# Patient Record
Sex: Male | Born: 1935 | Race: Black or African American | Hispanic: No | Marital: Married | Smoking: Never smoker
Health system: Southern US, Community
[De-identification: ages and names within clinical notes are randomized; demographics above are authoritative.]

## PROBLEM LIST (undated history)

## (undated) DIAGNOSIS — F0390 Unspecified dementia without behavioral disturbance: Secondary | ICD-10-CM

## (undated) DIAGNOSIS — I10 Essential (primary) hypertension: Secondary | ICD-10-CM

## (undated) DIAGNOSIS — I6529 Occlusion and stenosis of unspecified carotid artery: Secondary | ICD-10-CM

## (undated) DIAGNOSIS — I739 Peripheral vascular disease, unspecified: Secondary | ICD-10-CM

## (undated) DIAGNOSIS — E785 Hyperlipidemia, unspecified: Secondary | ICD-10-CM

## (undated) DIAGNOSIS — C189 Malignant neoplasm of colon, unspecified: Secondary | ICD-10-CM

## (undated) DIAGNOSIS — N184 Chronic kidney disease, stage 4 (severe): Secondary | ICD-10-CM

## (undated) HISTORY — DX: Occlusion and stenosis of unspecified carotid artery: I65.29

## (undated) HISTORY — DX: Malignant neoplasm of colon, unspecified: C18.9

---

## 2001-06-18 ENCOUNTER — Encounter (INDEPENDENT_AMBULATORY_CARE_PROVIDER_SITE_OTHER): Payer: Self-pay | Admitting: Specialist

## 2001-06-18 ENCOUNTER — Ambulatory Visit (HOSPITAL_COMMUNITY): Admission: RE | Admit: 2001-06-18 | Discharge: 2001-06-18 | Payer: Self-pay | Admitting: *Deleted

## 2003-11-13 HISTORY — PX: OTHER SURGICAL HISTORY: SHX169

## 2004-09-14 ENCOUNTER — Ambulatory Visit (HOSPITAL_COMMUNITY): Admission: RE | Admit: 2004-09-14 | Discharge: 2004-09-14 | Payer: Self-pay | Admitting: *Deleted

## 2004-09-14 ENCOUNTER — Encounter (INDEPENDENT_AMBULATORY_CARE_PROVIDER_SITE_OTHER): Payer: Self-pay | Admitting: *Deleted

## 2004-09-28 ENCOUNTER — Ambulatory Visit (HOSPITAL_COMMUNITY): Admission: RE | Admit: 2004-09-28 | Discharge: 2004-09-28 | Payer: Self-pay | Admitting: *Deleted

## 2004-10-04 ENCOUNTER — Inpatient Hospital Stay (HOSPITAL_COMMUNITY): Admission: RE | Admit: 2004-10-04 | Discharge: 2004-10-11 | Payer: Self-pay | Admitting: General Surgery

## 2004-10-04 ENCOUNTER — Encounter (INDEPENDENT_AMBULATORY_CARE_PROVIDER_SITE_OTHER): Payer: Self-pay | Admitting: Specialist

## 2004-10-12 ENCOUNTER — Ambulatory Visit: Payer: Self-pay | Admitting: Oncology

## 2004-10-12 DIAGNOSIS — C189 Malignant neoplasm of colon, unspecified: Secondary | ICD-10-CM

## 2004-10-12 HISTORY — DX: Malignant neoplasm of colon, unspecified: C18.9

## 2004-11-15 ENCOUNTER — Ambulatory Visit (HOSPITAL_COMMUNITY): Admission: RE | Admit: 2004-11-15 | Discharge: 2004-11-15 | Payer: Self-pay | Admitting: General Surgery

## 2004-11-15 ENCOUNTER — Ambulatory Visit (HOSPITAL_BASED_OUTPATIENT_CLINIC_OR_DEPARTMENT_OTHER): Admission: RE | Admit: 2004-11-15 | Discharge: 2004-11-15 | Payer: Self-pay | Admitting: General Surgery

## 2004-11-28 ENCOUNTER — Ambulatory Visit: Payer: Self-pay | Admitting: Oncology

## 2005-01-15 ENCOUNTER — Ambulatory Visit: Payer: Self-pay | Admitting: Oncology

## 2005-02-07 ENCOUNTER — Ambulatory Visit: Payer: Self-pay | Admitting: Dentistry

## 2005-02-07 ENCOUNTER — Encounter: Admission: RE | Admit: 2005-02-07 | Discharge: 2005-02-07 | Payer: Self-pay | Admitting: Dentistry

## 2005-02-15 ENCOUNTER — Ambulatory Visit (HOSPITAL_COMMUNITY): Admission: RE | Admit: 2005-02-15 | Discharge: 2005-02-15 | Payer: Self-pay | Admitting: Dentistry

## 2005-02-15 ENCOUNTER — Ambulatory Visit: Payer: Self-pay | Admitting: Dentistry

## 2005-03-02 ENCOUNTER — Ambulatory Visit: Payer: Self-pay | Admitting: Oncology

## 2005-03-13 ENCOUNTER — Ambulatory Visit: Payer: Self-pay | Admitting: Oncology

## 2005-03-21 ENCOUNTER — Ambulatory Visit: Payer: Self-pay | Admitting: Dentistry

## 2005-03-24 ENCOUNTER — Ambulatory Visit (HOSPITAL_COMMUNITY): Admission: AD | Admit: 2005-03-24 | Discharge: 2005-03-24 | Payer: Self-pay | Admitting: Oncology

## 2005-03-25 ENCOUNTER — Ambulatory Visit (HOSPITAL_COMMUNITY): Admission: AD | Admit: 2005-03-25 | Discharge: 2005-03-25 | Payer: Self-pay | Admitting: Oncology

## 2005-03-29 ENCOUNTER — Ambulatory Visit (HOSPITAL_COMMUNITY): Admission: RE | Admit: 2005-03-29 | Discharge: 2005-03-29 | Payer: Self-pay | Admitting: Oncology

## 2005-04-30 ENCOUNTER — Ambulatory Visit (HOSPITAL_COMMUNITY): Admission: RE | Admit: 2005-04-30 | Discharge: 2005-04-30 | Payer: Self-pay | Admitting: Oncology

## 2005-05-02 ENCOUNTER — Ambulatory Visit: Payer: Self-pay | Admitting: Oncology

## 2005-05-15 ENCOUNTER — Inpatient Hospital Stay (HOSPITAL_COMMUNITY): Admission: EM | Admit: 2005-05-15 | Discharge: 2005-05-19 | Payer: Self-pay | Admitting: Emergency Medicine

## 2005-05-17 ENCOUNTER — Encounter (INDEPENDENT_AMBULATORY_CARE_PROVIDER_SITE_OTHER): Payer: Self-pay | Admitting: *Deleted

## 2005-05-17 ENCOUNTER — Encounter (INDEPENDENT_AMBULATORY_CARE_PROVIDER_SITE_OTHER): Payer: Self-pay | Admitting: Cardiology

## 2005-06-20 ENCOUNTER — Ambulatory Visit: Payer: Self-pay | Admitting: Oncology

## 2005-07-02 ENCOUNTER — Ambulatory Visit (HOSPITAL_COMMUNITY): Admission: RE | Admit: 2005-07-02 | Discharge: 2005-07-02 | Payer: Self-pay | Admitting: Ophthalmology

## 2005-08-31 ENCOUNTER — Ambulatory Visit: Payer: Self-pay | Admitting: Oncology

## 2005-09-05 ENCOUNTER — Ambulatory Visit (HOSPITAL_COMMUNITY): Admission: RE | Admit: 2005-09-05 | Discharge: 2005-09-05 | Payer: Self-pay | Admitting: Oncology

## 2005-10-11 ENCOUNTER — Ambulatory Visit (HOSPITAL_COMMUNITY): Admission: RE | Admit: 2005-10-11 | Discharge: 2005-10-11 | Payer: Self-pay | Admitting: *Deleted

## 2005-12-10 ENCOUNTER — Ambulatory Visit: Payer: Self-pay | Admitting: Oncology

## 2005-12-14 ENCOUNTER — Ambulatory Visit (HOSPITAL_COMMUNITY): Admission: RE | Admit: 2005-12-14 | Discharge: 2005-12-14 | Payer: Self-pay | Admitting: Oncology

## 2006-03-04 ENCOUNTER — Ambulatory Visit: Payer: Self-pay | Admitting: Oncology

## 2006-03-07 ENCOUNTER — Ambulatory Visit (HOSPITAL_COMMUNITY): Admission: RE | Admit: 2006-03-07 | Discharge: 2006-03-07 | Payer: Self-pay | Admitting: Oncology

## 2006-05-10 ENCOUNTER — Ambulatory Visit: Payer: Self-pay | Admitting: Oncology

## 2006-05-13 LAB — CBC WITH DIFFERENTIAL (CANCER CENTER ONLY)
BASO#: 0 10*3/uL (ref 0.0–0.2)
BASO%: 0.5 % (ref 0.0–2.0)
HCT: 41.2 % (ref 38.7–49.9)
LYMPH%: 17.5 % (ref 14.0–48.0)
MCV: 86 fL (ref 82–98)
MONO#: 0.4 10*3/uL (ref 0.1–0.9)
NEUT%: 75.3 % (ref 40.0–80.0)
RDW: 13 % (ref 10.5–14.6)
WBC: 8 10*3/uL (ref 4.0–10.0)

## 2006-05-13 LAB — COMPREHENSIVE METABOLIC PANEL
ALT: 14 U/L (ref 0–40)
Alkaline Phosphatase: 102 U/L (ref 39–117)
Creatinine, Ser: 1.49 mg/dL (ref 0.40–1.50)
Sodium: 137 mEq/L (ref 135–145)
Total Bilirubin: 0.4 mg/dL (ref 0.3–1.2)
Total Protein: 7.4 g/dL (ref 6.0–8.3)

## 2006-05-13 LAB — CEA: CEA: 3.4 ng/mL (ref 0.0–5.0)

## 2006-05-13 LAB — HEMOGLOBIN A1C: Hgb A1c MFr Bld: 6.8 % — ABNORMAL HIGH (ref 4.6–6.1)

## 2006-05-29 ENCOUNTER — Ambulatory Visit (HOSPITAL_COMMUNITY): Admission: RE | Admit: 2006-05-29 | Discharge: 2006-05-29 | Payer: Self-pay | Admitting: Oncology

## 2006-07-09 ENCOUNTER — Ambulatory Visit (HOSPITAL_COMMUNITY): Admission: RE | Admit: 2006-07-09 | Discharge: 2006-07-09 | Payer: Self-pay | Admitting: *Deleted

## 2006-07-09 ENCOUNTER — Encounter (INDEPENDENT_AMBULATORY_CARE_PROVIDER_SITE_OTHER): Payer: Self-pay | Admitting: *Deleted

## 2006-08-19 ENCOUNTER — Ambulatory Visit: Payer: Self-pay | Admitting: Oncology

## 2006-11-11 ENCOUNTER — Ambulatory Visit: Payer: Self-pay | Admitting: Oncology

## 2006-11-13 LAB — CBC WITH DIFFERENTIAL (CANCER CENTER ONLY)
BASO%: 0.4 % (ref 0.0–2.0)
EOS%: 2.8 % (ref 0.0–7.0)
LYMPH#: 1.4 10*3/uL (ref 0.9–3.3)
MCHC: 32.8 g/dL (ref 32.0–35.9)
NEUT#: 5.5 10*3/uL (ref 1.5–6.5)
NEUT%: 72.9 % (ref 40.0–80.0)
Platelets: 182 10*3/uL (ref 145–400)
RDW: 14.3 % (ref 10.5–14.6)

## 2006-11-13 LAB — LIPID PANEL
HDL: 61 mg/dL (ref >39)
Total CHOL/HDL Ratio: 3.2 Ratio
Triglycerides: 108 mg/dL (ref <150)

## 2006-11-13 LAB — CMP AND LIVER
ALT: 16 U/L (ref 0–53)
AST: 13 U/L (ref 0–37)
Albumin: 4.5 g/dL (ref 3.5–5.2)
BUN: 20 mg/dL (ref 6–23)
Bilirubin, Direct: 0.1 mg/dL (ref 0.0–0.3)
CO2: 25 mEq/L (ref 19–32)
Calcium: 9.1 mg/dL (ref 8.4–10.5)
Chloride: 101 mEq/L (ref 96–112)
Creatinine, Ser: 1.47 mg/dL (ref 0.40–1.50)
Potassium: 4.6 mEq/L (ref 3.5–5.3)

## 2006-11-15 ENCOUNTER — Ambulatory Visit (HOSPITAL_COMMUNITY): Admission: RE | Admit: 2006-11-15 | Discharge: 2006-11-15 | Payer: Self-pay | Admitting: Oncology

## 2007-03-11 ENCOUNTER — Ambulatory Visit: Payer: Self-pay | Admitting: Oncology

## 2007-03-13 LAB — CBC WITH DIFFERENTIAL (CANCER CENTER ONLY)
BASO#: 0 10*3/uL (ref 0.0–0.2)
EOS%: 2.1 % (ref 0.0–7.0)
HGB: 13.6 g/dL (ref 13.0–17.1)
LYMPH#: 1.6 10*3/uL (ref 0.9–3.3)
MCHC: 32.7 g/dL (ref 32.0–35.9)
MONO#: 0.5 10*3/uL (ref 0.1–0.9)
NEUT#: 5.6 10*3/uL (ref 1.5–6.5)
RBC: 5.04 10*6/uL (ref 4.20–5.70)
WBC: 7.8 10*3/uL (ref 4.0–10.0)

## 2007-03-13 LAB — COMPREHENSIVE METABOLIC PANEL
Alkaline Phosphatase: 102 U/L (ref 39–117)
BUN: 19 mg/dL (ref 6–23)
CO2: 20 mEq/L (ref 19–32)
Creatinine, Ser: 1.51 mg/dL — ABNORMAL HIGH (ref 0.40–1.50)
Glucose, Bld: 188 mg/dL — ABNORMAL HIGH (ref 70–99)
Total Bilirubin: 0.5 mg/dL (ref 0.3–1.2)

## 2007-03-13 LAB — CEA: CEA: 3.7 ng/mL (ref 0.0–5.0)

## 2007-03-24 ENCOUNTER — Ambulatory Visit: Admission: RE | Admit: 2007-03-24 | Discharge: 2007-03-24 | Payer: Self-pay | Admitting: Ophthalmology

## 2007-09-12 ENCOUNTER — Ambulatory Visit: Payer: Self-pay | Admitting: Oncology

## 2007-09-15 LAB — CBC WITH DIFFERENTIAL (CANCER CENTER ONLY)
BASO#: 0 10*3/uL (ref 0.0–0.2)
Eosinophils Absolute: 0.2 10*3/uL (ref 0.0–0.5)
HGB: 12.9 g/dL — ABNORMAL LOW (ref 13.0–17.1)
LYMPH#: 1.6 10*3/uL (ref 0.9–3.3)
MCH: 26.6 pg — ABNORMAL LOW (ref 28.0–33.4)
MONO%: 6 % (ref 0.0–13.0)
NEUT#: 5.5 10*3/uL (ref 1.5–6.5)
RBC: 4.84 10*6/uL (ref 4.20–5.70)

## 2007-09-15 LAB — CEA: CEA: 3.7 ng/mL (ref 0.0–5.0)

## 2007-09-15 LAB — COMPREHENSIVE METABOLIC PANEL
ALT: 14 U/L (ref 0–53)
Albumin: 4.3 g/dL (ref 3.5–5.2)
CO2: 23 mEq/L (ref 19–32)
Calcium: 9.2 mg/dL (ref 8.4–10.5)
Chloride: 97 mEq/L (ref 96–112)
Potassium: 4.3 mEq/L (ref 3.5–5.3)
Sodium: 134 mEq/L — ABNORMAL LOW (ref 135–145)
Total Bilirubin: 0.5 mg/dL (ref 0.3–1.2)
Total Protein: 7.9 g/dL (ref 6.0–8.3)

## 2007-09-17 ENCOUNTER — Ambulatory Visit (HOSPITAL_COMMUNITY): Admission: RE | Admit: 2007-09-17 | Discharge: 2007-09-17 | Payer: Self-pay | Admitting: Oncology

## 2008-03-11 ENCOUNTER — Ambulatory Visit: Payer: Self-pay | Admitting: Oncology

## 2008-03-15 LAB — COMPREHENSIVE METABOLIC PANEL
Albumin: 4.3 g/dL (ref 3.5–5.2)
BUN: 21 mg/dL (ref 6–23)
CO2: 21 mEq/L (ref 19–32)
Calcium: 9.1 mg/dL (ref 8.4–10.5)
Chloride: 105 mEq/L (ref 96–112)
Glucose, Bld: 103 mg/dL — ABNORMAL HIGH (ref 70–99)
Potassium: 4.1 mEq/L (ref 3.5–5.3)
Sodium: 140 mEq/L (ref 135–145)
Total Protein: 7.7 g/dL (ref 6.0–8.3)

## 2008-03-15 LAB — CBC WITH DIFFERENTIAL (CANCER CENTER ONLY)
Eosinophils Absolute: 0.2 10*3/uL (ref 0.0–0.5)
HCT: 37.7 % — ABNORMAL LOW (ref 38.7–49.9)
LYMPH%: 17.3 % (ref 14.0–48.0)
MCV: 78 fL — ABNORMAL LOW (ref 82–98)
MONO#: 0.4 10*3/uL (ref 0.1–0.9)
NEUT%: 74.2 % (ref 40.0–80.0)
Platelets: 229 10*3/uL (ref 145–400)
RBC: 4.81 10*6/uL (ref 4.20–5.70)
WBC: 7.5 10*3/uL (ref 4.0–10.0)

## 2008-03-15 LAB — CEA: CEA: 3.6 ng/mL (ref 0.0–5.0)

## 2008-03-17 ENCOUNTER — Ambulatory Visit (HOSPITAL_COMMUNITY): Admission: RE | Admit: 2008-03-17 | Discharge: 2008-03-17 | Payer: Self-pay | Admitting: Oncology

## 2008-03-18 ENCOUNTER — Ambulatory Visit (HOSPITAL_COMMUNITY): Admission: RE | Admit: 2008-03-18 | Discharge: 2008-03-18 | Payer: Self-pay | Admitting: Oncology

## 2008-09-29 ENCOUNTER — Ambulatory Visit: Payer: Self-pay | Admitting: Oncology

## 2008-10-01 LAB — CMP (CANCER CENTER ONLY)
AST: 22 U/L (ref 11–38)
Albumin: 3.7 g/dL (ref 3.3–5.5)
Alkaline Phosphatase: 68 U/L (ref 26–84)
BUN, Bld: 22 mg/dL (ref 7–22)
Creat: 1.9 mg/dl — ABNORMAL HIGH (ref 0.6–1.2)
Potassium: 4.8 mEq/L — ABNORMAL HIGH (ref 3.3–4.7)
Total Bilirubin: 0.7 mg/dl (ref 0.20–1.60)

## 2008-10-01 LAB — CBC WITH DIFFERENTIAL (CANCER CENTER ONLY)
Eosinophils Absolute: 0.2 10*3/uL (ref 0.0–0.5)
LYMPH#: 1.2 10*3/uL (ref 0.9–3.3)
MONO#: 0.3 10*3/uL (ref 0.1–0.9)
NEUT#: 4.7 10*3/uL (ref 1.5–6.5)
Platelets: 208 10*3/uL (ref 145–400)
RBC: 4.4 10*6/uL (ref 4.20–5.70)
WBC: 6.4 10*3/uL (ref 4.0–10.0)

## 2008-11-01 ENCOUNTER — Ambulatory Visit (HOSPITAL_COMMUNITY): Admission: RE | Admit: 2008-11-01 | Discharge: 2008-11-01 | Payer: Self-pay | Admitting: *Deleted

## 2008-11-01 ENCOUNTER — Encounter (INDEPENDENT_AMBULATORY_CARE_PROVIDER_SITE_OTHER): Payer: Self-pay | Admitting: *Deleted

## 2009-04-20 ENCOUNTER — Ambulatory Visit: Payer: Self-pay | Admitting: Oncology

## 2009-04-21 LAB — CMP (CANCER CENTER ONLY)
AST: 17 U/L (ref 11–38)
Albumin: 3.8 g/dL (ref 3.3–5.5)
BUN, Bld: 24 mg/dL — ABNORMAL HIGH (ref 7–22)
Calcium: 10 mg/dL (ref 8.0–10.3)
Chloride: 107 mEq/L (ref 98–108)
Glucose, Bld: 122 mg/dL — ABNORMAL HIGH (ref 73–118)
Potassium: 4.5 mEq/L (ref 3.3–4.7)
Sodium: 146 mEq/L — ABNORMAL HIGH (ref 128–145)
Total Protein: 8.3 g/dL — ABNORMAL HIGH (ref 6.4–8.1)

## 2009-04-21 LAB — CBC WITH DIFFERENTIAL (CANCER CENTER ONLY)
EOS%: 3.2 % (ref 0.0–7.0)
MCH: 26.8 pg — ABNORMAL LOW (ref 28.0–33.4)
MCHC: 34.3 g/dL (ref 32.0–35.9)
MONO%: 4.6 % (ref 0.0–13.0)
NEUT#: 4.6 10*3/uL (ref 1.5–6.5)
Platelets: 226 10*3/uL (ref 145–400)
RBC: 4.78 10*6/uL (ref 4.20–5.70)

## 2009-04-25 ENCOUNTER — Ambulatory Visit (HOSPITAL_COMMUNITY): Admission: RE | Admit: 2009-04-25 | Discharge: 2009-04-25 | Payer: Self-pay | Admitting: Oncology

## 2009-11-08 ENCOUNTER — Ambulatory Visit: Payer: Self-pay | Admitting: Oncology

## 2009-11-10 LAB — CBC WITH DIFFERENTIAL (CANCER CENTER ONLY)
BASO#: 0 10*3/uL (ref 0.0–0.2)
EOS%: 1.8 % (ref 0.0–7.0)
HGB: 12.6 g/dL — ABNORMAL LOW (ref 13.0–17.1)
MCH: 26.6 pg — ABNORMAL LOW (ref 28.0–33.4)
MCHC: 32.2 g/dL (ref 32.0–35.9)
MONO%: 4.2 % (ref 0.0–13.0)
NEUT#: 5.1 10*3/uL (ref 1.5–6.5)
NEUT%: 76.4 % (ref 40.0–80.0)
RDW: 13.8 % (ref 10.5–14.6)

## 2009-11-10 LAB — CMP (CANCER CENTER ONLY)
AST: 19 U/L (ref 11–38)
Creat: 1.7 mg/dl — ABNORMAL HIGH (ref 0.6–1.2)
Glucose, Bld: 197 mg/dL — ABNORMAL HIGH (ref 73–118)
Potassium: 4 mEq/L (ref 3.3–4.7)

## 2010-05-01 ENCOUNTER — Ambulatory Visit: Payer: Self-pay | Admitting: Oncology

## 2010-05-12 LAB — CBC WITH DIFFERENTIAL (CANCER CENTER ONLY)
BASO#: 0 10*3/uL (ref 0.0–0.2)
BASO%: 0.4 % (ref 0.0–2.0)
EOS%: 3 % (ref 0.0–7.0)
Eosinophils Absolute: 0.2 10*3/uL (ref 0.0–0.5)
HGB: 12.5 g/dL — ABNORMAL LOW (ref 13.0–17.1)
MCV: 82 fL (ref 82–98)
MONO#: 0.6 10*3/uL (ref 0.1–0.9)
MONO%: 7.6 % (ref 0.0–13.0)
Platelets: 263 10*3/uL (ref 145–400)
WBC: 7.6 10*3/uL (ref 4.0–10.0)

## 2010-05-12 LAB — CMP (CANCER CENTER ONLY)
AST: 21 U/L (ref 11–38)
Alkaline Phosphatase: 84 U/L (ref 26–84)
BUN, Bld: 26 mg/dL — ABNORMAL HIGH (ref 7–22)
CO2: 27 mEq/L (ref 18–33)
Glucose, Bld: 94 mg/dL (ref 73–118)
Total Bilirubin: 0.6 mg/dl (ref 0.20–1.60)
Total Protein: 7.8 g/dL (ref 6.4–8.1)

## 2011-03-27 NOTE — Op Note (Signed)
NAME:  Jonathan Baird, CUPO NO.:  [id number]   MEDICAL RECORD NO.:  [id number]          PATIENT TYPE:  AMB   LOCATION:  DFTL                         FACILITY:  [facility]   PHYSICIAN:  Clent Demark. Rankin, M.D.   DATE OF BIRTH:  1933-08-09   DATE OF PROCEDURE:  03/24/2007  DATE OF DISCHARGE:                               OPERATIVE REPORT   PREOPERATIVE DIAGNOSES:  1. Vitreomacular traction syndrome, OD, with epiretinal membrane, OD.  2. Progressive proliferative diabetic retinopathy, OD.  3. Chronic cystoid clinically significant macula edema, OD, secondary      #1 and 2.   POSTOPERATIVE DIAGNOSES:  1. Vitreomacular traction syndrome, OD, with epiretinal membrane, OD.  2. Progressive proliferative diabetic retinopathy, OD.  3. Chronic cystoid clinically significant macula edema, OD, secondary      #1 and 2.   PROCEDURES:  1. Posterior vitrectomy with membrane peel, epiretinal membrane, OD.  2. Endolaser panphotocoagulation, OD, 25 gauge.   SURGEON:  Clent Demark. Rankin, M.D.   ANESTHESIA:  Local retrobulbar with monitored anesthetic control.   INDICATIONS FOR PROCEDURES:  The patient is a 77 year old man, who has  significant profound visual acuity impairment of the right eye on the  basis of chronic cystoid macular edema.  The patient was found to have  vitreoretinal traction syndrome with vitreomacular traction.  The  patient understands the need to remove the vitreous scaffold in order to  allow for enhanced perfusion.  He understands the risks of anesthesia,  including the rare occurrence of death, but also to the eye, including  but not limited to from the condition and surgical repair  hemorrhage,  infection, scarring, need for further surgery, no change in vision, loss  of vision, progression of disease despite intervention.  Appropriate  signed consent was obtained and the patient was taken to the operating  room.   DESCRIPTION OF PROCEDURES:  In the operating  room, appropriate  monitoring was followed by mild sedation.  Marcaine 0.75% was delivered,  5 mL retrobulbar, followed by an additional 5 mL laterally in the  fashion of a modified [employer].  The right  periocular region was  sterilely prepped and draped in the usual ophthalmic fashion.  A lid  speculum was applied.  A 25-gauge trocar system was then used to place  the infusion inferotemporally.  Superior trocars were placed.  Core  vitrectomy was then begun.  Physician-induced removal of the posterior  hyaloid was then induced nasal to the optic nerve.  The vitreous was  then elevated 360 degrees anterior to the equator and the vitreous skirt  circumcised.  Endolaser photocoagulation was placed 360 degrees.  Forceps were then used to engage the membrane.  This was removed without  difficulty.  At this time, Endolaser photocoagulation was placed  360 degrees peripherally.  At this time, the superior trocars were  removed.  The infusion was removed.  Subconjunctival Decadron was  applied.  A sterile patch and a Fox shield were applied.  The patient  tolerated the procedure without complication.      Clent Demark Rankin, M.D.  Electronically  Signed     GAR/MEDQ  D:  03/24/2007  T:  03/24/2007  Job:  [id number]

## 2011-03-27 NOTE — Op Note (Signed)
NAME:  Jonathan Baird, DABISH NO.:  [id number]   MEDICAL RECORD NO.:  [id number]          PATIENT TYPE:  AMB   LOCATION:  ENDO                         FACILITY:  [facility]   PHYSICIAN:  Waverly Ferrari, M.D.    DATE OF BIRTH:  1932/12/19   DATE OF PROCEDURE:  DATE OF DISCHARGE:                               OPERATIVE REPORT   PROCEDURE:  Colonoscopy.   ANESTHESIA:  Fentanyl 100 mcg, Versed 10 mg.   INDICATIONS:  Colon cancer.   PROCEDURE:  With the patient mildly sedated in the left lateral  decubitus position the Pentax videoscopic colonoscope was inserted in  the rectum after a normal rectal examination and passed under direct  vision with pressure applied to reach the neocecum which was  photographed.  From this point the colonoscope was slowly withdrawn  taking circumferential views of colonic mucosa stopping only then in the  rectum which appeared normal on direct and showed hemorrhoids and one  small polyp on retroflexed view which was removed using hot biopsy  forceps technique setting of 20/150 blended current.  The endoscope was  then straightened and withdrawn.  The patient's vital signs and pulse  oximeter remained stable.  The patient tolerated the procedure well  without apparent complications.   FINDINGS:  Internal hemorrhoids, polyp of rectum, otherwise unremarkable  exam.   PLAN:  Await biopsy report.  The patient will call me for results and  follow-up with me as needed as an outpatient.           ______________________________  Waverly Ferrari, M.D.     GMO/MEDQ  D:  11/01/2008  T:  11/01/2008  Job:  [id number]

## 2011-03-30 NOTE — Op Note (Signed)
NAME:  Jonathan Baird, Jonathan Baird               ACCOUNT NO.:  [id number]   MEDICAL RECORD NO.:  [id number]          PATIENT TYPE:  AMB   LOCATION:  SDS                          FACILITY:  [facility]   PHYSICIAN:  Clent Demark. Rankin, M.D.   DATE OF BIRTH:  13-Jan-1933   DATE OF PROCEDURE:  07/02/2005  DATE OF DISCHARGE:  07/02/2005                                 OPERATIVE REPORT   PREOPERATIVE DIAGNOSES:  1.  Epiretinal membrane, left eye.  2.  Progressive proliferative diabetic retinopathy, left eye.   POSTOPERATIVE DIAGNOSES:  1.  Epiretinal membrane, left eye.  2.  Progressive proliferative diabetic retinopathy, left eye.   PROCEDURE:  1.  Posterior vitrectomy membrane peel--internal limiting membrane, and      epiretinal membrane, left eye.  2.  Endo-laser photocoagulation, left eye.   SURGEON:  Clent Demark. Rankin, M.D.   ANESTHESIA:  Local retrobulbar with monitored anesthesia control.   INDICATIONS FOR PROCEDURE:  Patient is a 77 year old man with profound  vision loss on the basis of epiretinal membrane and topographic distortion  of the macular region, as well as progressive proliferative diabetic  retinopathy.  This is an attempt to release the traction and also improve  the macular effusion for persistent chronic recurrent macular edema.  This  is an attempt.  Patient understands the risks of anesthesia as well as  surgical repair from the underlying condition as well as, including but not  limited to hemorrhage, infection, scarring, need for further surgery, no  change in vision, loss in vision, or progression disease despite  intervention.  Appropriate signed consent was obtained.  The patient was  taken to the operating room.  In the operating room, appropriate monitors  was followed by mild sedation.  Then, 0.75% Marcaine delivered 5 cc with an  additional 5 cc laterally in the fashion of a modified [employer].  The left  ocular region was sterilely prepped and draped in the usual  ophthalmic  fashion.  A lid speculum applied.  A 25 gauge vitrectomy system was then  introduced by using a 25 gauge trocar in the infratemporal quadrant.  Placement into the vitreous cavity was verified visually.  Superior trocars  were placed.  The microscope was used for guidance with the BIOM attachment.  Core vitrectomy was then begun.  Modified en bloc technique was then used to  remove the posterior hyaloid and then the vitreous skirt was elevated and  trimmed and circumcised 360 degrees.  Endo-laser photocoagulation __________  360 degrees.  Upper outer membrane was initially grabbed with 25 gauge  forceps, and then a second deep layer of internal membrane was removed off  the macular and foveal region.  No complications occurred.  At this time,  under low effusion pressure, the superior  trocar was removed.  The effusion removed.  Subconjunctival steroid was  applied.  Sterile patch and Fox shield applied to left eye.  The patient was  taken to the short stay unit and was discharged home as an outpatient  without complications.      Clent Demark Rankin, M.D.  Electronically Signed  GAR/MEDQ  D:  07/02/2005  T:  07/02/2005  Job:  [id number]

## 2011-03-30 NOTE — Op Note (Signed)
NAME:  CLEARENCE, TWOREK NO.:  [id number]   MEDICAL RECORD NO.:  [id number]          PATIENT TYPE:  AMB   LOCATION:  ENDO                         FACILITY:  [facility]   PHYSICIAN:  Waverly Ferrari, M.D.    DATE OF BIRTH:  [birth year]   DATE OF PROCEDURE:  09/28/2004  DATE OF DISCHARGE:                                 OPERATIVE REPORT   ADDENDUM:  [place] copy of flexible sigmoidoscopy report to Dr. Magdalene River  at [facility].       GMO/MEDQ  D:  09/28/2004  T:  09/28/2004  Job:  [id number]   cc:   Shellia Carwin, M.D.  [address]  [place] [address]  Fax: [phone]

## 2011-03-30 NOTE — Op Note (Signed)
NAME:  Jonathan Baird, Jonathan Baird NO.:  [id number]   MEDICAL RECORD NO.:  [id number]          PATIENT TYPE:  AMB   LOCATION:  ENDO                         FACILITY:  [facility]   PHYSICIAN:  Waverly Ferrari, M.D.    DATE OF BIRTH:  [birth year]   DATE OF PROCEDURE:  09/28/2004  DATE OF DISCHARGE:                                 OPERATIVE REPORT   PROCEDURE PERFORMED:  Flexible sigmoidoscopy.   ENDOSCOPIST:  Waverly Ferrari, M.D.   INDICATIONS FOR PROCEDURE:  Patient had abnormal CT scan with questionable 2  cm finding in the rectosigmoid, actually in the rectum upon review.   DESCRIPTION OF PROCEDURE:  With the patient in the left lateral decubitus  position without sedation, the Olympus video colonoscope was inserted in the  rectum and passed under direct vision to approximately 50 cm from the anal  verge.  From this point, the endoscope was slowly withdrawn taking  circumferential views of the colonic mucosa stopping in the rectum which  appeared normal on direct and on retroflex views both close up and from a  distance.  We then straightened the endoscope and withdrew it from the anal  canal making circumferential views of the rectum.  No abnormalities were  noted.  The endoscope was withdrawn.  Patient's vital signs and pulse  oximeter remained stable.  The patient tolerated the procedure well without  apparent complication.   FINDINGS:  No abnormalities noted in the most distal 50 cm of the colon.   PLAN:  Proceed with planned surgery of right hemicolectomy.       GMO/MEDQ  D:  09/28/2004  T:  09/28/2004  Job:  [id number]

## 2011-03-30 NOTE — Op Note (Signed)
NAME:  Jonathan Baird, Jonathan Baird NO.:  [id number]   MEDICAL RECORD NO.:  [id number]          PATIENT TYPE:  AMB   LOCATION:  ENDO                         FACILITY:  [facility]   PHYSICIAN:  Waverly Ferrari, M.D.    DATE OF BIRTH:  1933/03/17   DATE OF PROCEDURE:  07/09/2006  DATE OF DISCHARGE:                                 OPERATIVE REPORT   PROCEDURE:  Colonoscopy   INDICATIONS:  Colon cancer.   ANESTHESIA:  Demerol 60, Versed 7.5 mg.   DESCRIPTION OF PROCEDURE:  With the patient mildly sedated in the left  lateral decubitus position, the Olympus videoscopic colonoscope was inserted  into the rectum.  After normal rectal exam was performed, I passed under  direct vision to a neo-cecum, identified by base of the neo-cecum and we  traversed through the anastomosis into the small intestine.  Subsequently  the colonoscope was then slowly withdrawn, taking circumferential views of  colonic mucosa, stopping at 40 cm from anal verge at which point a small  polyp was seen, photographed, and removed using hot biopsy forceps technique  in a setting of 20/200 blended current.  The endoscope was then withdrawn  all the way to the rectum which appeared normal on direct and retroflexed  view.  The endoscope was straightened and withdrawn.  The patient's vital  signs and pulse oximeter remained stable.  The patient tolerated the  procedure well without apparent complications.   FINDINGS:  Small polyp at 40 cm from the anal verge are removed.  Await  biopsy report.  Otherwise an unremarkable colonoscopic examination to the  neo-cecum.   PLAN:  Await biopsy report.  The patient will call me for results; and  follow-up with me as an outpatient.           ______________________________  Waverly Ferrari, M.D.     GMO/MEDQ  D:  07/09/2006  T:  07/09/2006  Job:  [id number]

## 2011-03-30 NOTE — H&P (Signed)
NAME:  Jonathan Baird, LIZ NO.:  [id number]   MEDICAL RECORD NO.:  [id number]          PATIENT TYPE:  INP   LOCATION:  3015                         FACILITY:  [facility]   PHYSICIAN:  Lolita Lenz, M.D.DATE OF BIRTH:  1933-03-14   DATE OF ADMISSION:  05/15/2005  DATE OF DISCHARGE:                                HISTORY & PHYSICAL   PRIMARY CARE PHYSICIAN:  Merrilee Seashore, M.D.   PRIMARY ONCOLOGIST:  Marcy Panning, MD.   CHIEF COMPLAINT:  Acute change in mental status.   HISTORY OF PRESENT ILLNESS:  The patient is a 77 year old African-American  male diagnosed with colon cancer last year, status post right hemicolectomy  who is undergoing adjuvant immunomodulator therapy per oncology.  He is  brought in today by his wife who reports that beginning yesterday he became  increasingly confused, not oriented, and not acting himself.  She did not  report that he had any complaints of headache, nausea, or vomiting prior to  the onset of these symptoms.  He did show any signs of weakness, no syncope,  no falls, and no history of head trauma.  She reports that he had his first  immunomodulator therapy last week and that he is scheduled to get it every  two weeks.  He had it last Wednesday.  Did not have any effects immediately  following, and it has been approximately one week since that time. He has  not had any recent weight gain or weight loss.  No nausea.  No vomiting is  reported.  He has had some diarrhea, but that is ascribed to his chemo and  immunomodulator therapy.  He is not complaining of any chest pain, shortness  of breath to her knowledge.   Past medical history is significant for colon cancer status post right  hemicolectomy, noninsulin dependent diabetes mellitus, status post bilateral  cataract removal two weeks ago, and history of hypertension.   MEDICATIONS:  1.  Lisinopril 20 mg p.o. daily.  2.  Glipizide 2.5 mg p.o. daily.  3.  Actos 30 mg  p.o. daily.  4.  Zofran 8 mg IV q.6h p.r.n.  5.  Ativan 1.5 mg p.o. t.i.d. p.r.n.  6.  Repleva 21/7.   As noted, the patient is also on a immunomodulator that is noted here in his  chart.  It is not specifically listed in  his chart. Will get that  information from his oncologist.   He has no known drug allergies.   PAST MEDICAL/SURGICAL HISTORY:  He has had multiple teeth removal.   FAMILY HISTORY:  Positive for diabetes and hypertension.   SOCIAL HISTORY:  Does not drink.  Does not smoke.  According to his wife, he  is married.  He has been independent of activities of daily life up until  his admission here to the hospital.   REVIEW OF SYSTEMS:  Per the HPI.   PHYSICAL EXAMINATION:  VITAL SIGNS:  Initial blood pressure was 225/86 at  the time of admission.  It is now 123/65, pulse 78, temperature 101.7, O2  saturation 98% on room air.  The patient is oriented to person and place,  but not to time.  He is ambulating on his own.  HEENT:  Head is atraumatic, normocephalic.  Funduscopic exam does not really  have any evidence of papilledema.  Pupils are reactive.  Oropharynx is  clear.  He has had multiple teeth removal.  This was done in April to  prevent further infections concurrent with his colon cancer.  NECK:  Supple.  There is no evidence of jugular venous distention.  No  carotid bruits are noted.  Full range of motion.  No nuchal rigidity.  LUNGS:  Clear to auscultation bilaterally.  HEART:  Rate is regular with normal S1, S2.  No murmurs are appreciated.  ABDOMEN:  Soft, nontender.  No rebound.  No guarding with active bowel  sounds.  He has no costovertebral angle tenderness.  2+ femoral pulses, 2+  dorsalis pedis pulses.  No peripheral edema.  NEUROLOGIC:  He is ambulating without assistance.  He is alert and oriented  to person and to place.  He does follow commands.  He cannot name objects.  Motor strength is 5/5 in upper and lower extremity.  Reflexes are  intact.   OTHER EXAM FINDINGS:  He has large keratotic toenails with onychomycoses on  his feet bilaterally.   INITIAL LABS:  Initial head CT without evidence of acute bleed or acute  abnormality.  CBC showing white count 9000, hemoglobin 12.9, platelets 174,  sodium 134, potassium 4.4, BUN 27, chloride 104, creatinine 1.7.  Urinalysis  negative for nitrite, leukocyte esterase, no white blood cells.  No RBCs.  Rare bacteria.  The patient does have a Port-A-Cath as well left upper  chest.  Initial CBG was 176 by Glucometer.   IMPRESSION:  Acute confusional state, altered mental status in a patient  with history of colon cancer undergoing immunomodulator therapy with risk  for cerebrovascular accident including diabetes and hypertension.  At this  point, the patient does not seem to have any evidence of any intracranial  process or intracranial hemorrhaging with vomiting and other focal  neurological deficits.  It is unclear whether medications may be related to  this initial alteration of mental status although oncologist had not thought  this was this was the case when they were initially consulted in the  emergency department.  Differential diagnosis could include a stroke,  seizure, infection; all of which are possibilities.  Systemic causes can  also include hypertensive encephalopathy as his blood pressure was elevated  at the time of admission.   PLAN:  At this time, will admit him to a neuro floor, have q.6h neuro  checks, will check CMET, CBC, PT, PTT, TSH, and urine drug screen as well.  Will get serial cardiac enzymes and in the morning get an MRI/MRA as well as  an EEG.  Continue his home medications and control his blood pressure, and  Lovenox for DVT prophylaxis as this time.  We will avoid his Ativan and  other sedative or narcotic type drugs until his mental status clears.  Will obtain neurology consult if his mental status stays confused without  specific  etiology.       RLK/MEDQ  D:  05/15/2005  T:  05/15/2005  Job:  [id number]

## 2011-03-30 NOTE — Procedures (Signed)
CLINICAL INFORMATION:  This 77 year old, African American, male diabetic  seemed to be somewhat slow and speech, according to notes by the EEG  technician. Workup in the hospital is for altered mental status..   TECHNICAL DESCRIPTION:  This EEG was recorded during awake and drowsy  states. The background activity shows an asymmetry in the background  rhythms. There is well-formed alpha and beta activity seen primarily in the  right hemisphere with theta and occasional delta rhythms seen in the left  hemisphere. There is no evidence of any epileptiform activity. Photic  stimulation did not produce a driving response and hyperventilation testing  was not performed. No definite stage II sleep was present, but drowsiness  was recorded.   IMPRESSION:  This is an abnormal EEG showing persistent slowing above and  below the fissure of Sylvius in the left hemisphere as compared to the  right. These findings would be most compatible with a destructive lesion in  the left hemisphere, for instance a stroke in the left MCA distribution. No  other definite abnormalities are seen.       [id number]  D:  05/16/2005 15:28:18  T:  05/16/2005 16:22:58  Job #:  [id number]

## 2011-03-30 NOTE — Discharge Summary (Signed)
NAME:  Jonathan Baird, Jonathan Baird NO.:  [id number]   MEDICAL RECORD NO.:  [id number]          PATIENT TYPE:  INP   LOCATION:  3015                         FACILITY:  [facility]   PHYSICIAN:  Lolita Lenz, M.D.DATE OF BIRTH:  Jun 21, 1933   DATE OF ADMISSION:  05/15/2005  DATE OF DISCHARGE:  05/19/2005                                 DISCHARGE SUMMARY   DISCHARGE DIAGNOSES:  1.  Expressive and receptive aphasia.  2.  Hypertension.  3.  Diabetes.  4.  History of colon cancer, status post right hemicolectomy.   DISCHARGE MEDICATIONS:  1.  Lisinopril 20 mg p.o. daily.  2.  Glucotrol 2.5 mg p.o. daily.  3.  Actos 30 mg p.o. daily.  4.  Protonix 40 mg p.o. daily.  5.  Hydrochlorothiazide 25 mg p.o. daily.  6.  Lantus 20 units subcutaneously q.h.s.  7.  The patient is also receiving immunomodulate therapy per his oncologist,      to resume that at prior doses pending their approval.   CONSULTATIONS:  Neurology, Jill Alexanders, M.D.   PROCEDURES:  1.  EEG read on May 16, 2005, showing abnormal EEG with findings compatible      with destructive lesion, left hemisphere.  2.  MRI/MRA of the head showing no acute intracranial process, no acute CVA.  3.  Repeat MRI done on May 18, 2005, also shows no acute CVA.  4.  Bilateral carotid Doppler's shows bilateral carotid stenosis, 40 to 60%.  5.  2-D echocardiogram shows preserved left ventricular systolic function,      no cardioembolic source of ischemic CVA is identified.   LABORATORY DATA:  RPR and TPPA positive with a titer of 1:2.  Lumbar  puncture also performed by Dr. Jannifer Franklin on May 17, 2005, with negative  findings on the cerebrospinal fluid.  No significant findings on  cerebrospinal fluid.   HOSPITAL COURSE:  The patient is a 77 year old African-American male with a  history of colon cancer who underwent chemotherapy and also immunomodular  therapy who was brought in by his wife because of acute onset of confusion  and aphasia.  Wife reports that on the Monday prior to his admission, just  three days prior to his admission, that he was walking, talking, performing  activities of daily living, and actually had driven to [employer] by  himself.  However, following that day, he became increasingly confused, was  unable to complete sentences, was having difficulty findings words.  He did  not have any evidence of motor weakness, but he was brought in for  evaluation for suspected CVA.  Extensive workup for CVA did not reveal any  radiographic findings, although his physical examination findings were  consistent with such.  He had some waxing and waning symptoms throughout his  hospital course, and repeat MRI was ordered, but as noted above, no  radiographic evidence was found for CVA.  A trial of Keppra was started just  for one day, but this had apparently increased the patient's confusion and  therefore this was discontinued.  The wife had refused to have any further  anti-epileptic regimens initiated as per neurology recommendations.  At the  time of discharge, the patient was following commands, had 5/5 motor  strength, was ambulating without assistance, but was still having difficulty  naming objects.  He could name his wife, his own name, but was not oriented  to place or to time.  Vital signs were stable, normotensive, CBG in the  120s.   PHYSICAL EXAMINATION:  LUNGS:  Clear.  HEART:  Rate was regular.  ABDOMEN:  Soft.  EXTREMITIES:  He had no peripheral edema.  He had noted 5/5 strength of  upper and lower extremities.  HEENT:  Pupils were equal and reactive.  Extraocular movements were intact.  NEUROLOGIC:  He had 1+ DTR's throughout.  Toes were downgoing.   FOLLOWUP:  The patient will follow up with Dr. Jannifer Franklin in the office.  They  are to call Monday morning for an appointment.  Phone number is given to  them.  I have discussed the case with Dr. Gaynell Face who had covered for the  weekend and  agreed that there is no other active issues or treatments being  provided for the patient here in the hospital at this time.  There is a  suggestion that the patient may at some later point have a nocturnal EEG  done to rule out any other ongoing nocturnal issues that may be presenting  with symptoms.  This can be set up as an outpatient when the patient follows  up with neurology.   The patient is to resume his home medications as noted.  His blood pressure  and diabetes are under good control, and follow up with neurology as stated  above.  The patient's wife is also instructed not to leave the patient alone  and not to let him drive, and that if he has any further deterioration of  symptoms to bring him back for further evaluation.       RLK/MEDQ  D:  05/19/2005  T:  05/19/2005  Job:  [id number]   cc:   Jill Alexanders, M.D.  [address] [place] [address]  Fax: [phone]

## 2011-03-30 NOTE — Op Note (Signed)
NAME:  Jonathan Baird, Jonathan Baird NO.:  [id number]   MEDICAL RECORD NO.:  [id number]          PATIENT TYPE:  INP   LOCATION:  3015                         FACILITY:  [facility]   PHYSICIAN:  Jill Alexanders, M.D.  DATE OF BIRTH:  03-04-1933   DATE OF PROCEDURE:  05/17/2005  DATE OF DISCHARGE:                                 OPERATIVE REPORT   PROCEDURE:  Lumbar puncture.   HISTORY:  This is a 77 year old gentleman with a history of onset of aphasia  without MRI correlate.  The patient is being evaluated for possible herpes  encephalitis, neoplastic involvement of the brain.  The patient has a  history of colon cancer.   Lumbar puncture was performed with the patient in the fetal position on the  right side.  The low back was cleaned with Betadine solution and  approximately 2 mL of 1% Xylocaine was used as a local anesthetic.  A 20-  gauge spinal needle was inserted at the L3-4 interspace and approximately 18  mL of clear, colorless spinal fluid was removed for testing.  Opening  pressure was 240 mmH2O.  Tube #1 was sent for VDRL, cryptococcal antigen,  angiotensin-converting enzyme level.  Tube #2 was sent for herpes PCR.  Tube  #3 was sent for cells, differential, glucose, protein.  Tube #4 was sent for  cytology.  No complications of the above procedure were noted.  The patient  tolerated the procedure well.       CKW/MEDQ  D:  05/17/2005  T:  05/17/2005  Job:  [id number]

## 2011-03-30 NOTE — Consult Note (Signed)
NAME:  Jonathan Baird, Jonathan Baird NO.:  [id number]   MEDICAL RECORD NO.:  [id number]          PATIENT TYPE:  INP   LOCATION:  3015                         FACILITY:  [facility]   PHYSICIAN:  Jill Alexanders, M.D.  DATE OF BIRTH:  1933/10/08   DATE OF CONSULTATION:  05/17/2005  DATE OF DISCHARGE:                                   CONSULTATION   HISTORY OF PRESENT ILLNESS:  Jonathan Baird is a 77 year old right-handed  black gentleman born 1932-12-16, with a history of colon cancer  resected in November, 2005. Apparently, the patient did not have any  positive nodes, has undergone chemotherapy for this.  The patient is  followed by Dr. Humphrey Rolls.  The patient [facility] of [facility] on [facility], 2005 with onset of speech disturbance.  The patient had some transient  problems with speech on May 13, 2005, seemed to improve, normalize, was  normal on the morning of May 14, 2005, but by the evening of May 14, 2005,  aphasia returned.  The patient was brought in for further evaluation with  presumed stroke event.  The patient had no other focal symptoms of numbness,  weakness, or visual field disturbance.  The patient has persistent with his  aphasia since that time.  An MRI scan of the brain, however, showed evidence  of only chronic small vessel ischemic changes.  No acute stroke was seen.  An MRI/MRA with intracoronary vessels were relatively unremarkable.  Carotid  Doppler and TE echocardiogram is pending at this time.  An EEG study was  performed showing evidence of left brain slowing.  The patient again,  however, has no correlate to this on MRI of the brain.   Neurology was asked to see the patient for further evaluation.   SIGNIFICANT PAST MEDICAL HISTORY:  1.  History of new-onset aphasia, MRI of the brain negative for stroke.  2.  Diabetes.  3.  Colon cancer status post resection in November, 2005.  Again, all lymph      nodes were negative.  4.  History of  dental surgery, jaw surgery in the past.  5.  History of hypertension.  6.  History of left cataract surgery.  7.  There are some reports of seizures as an infant.  The patient was never      treated with medication.   HABITS:  The patient does not smoke or drink.   CURRENT MEDICATIONS:  1.  Lovenox 40 mg subcutaneously q.24 h.  2.  Glipizide 2.5 mg daily.  3.  Hydrochlorothiazide 25 mg a day.  4.  Sliding scale insulin.  5.  Lantus insulin 15 units q.h.s. with NovoLog 2-5 units q.h.s.  6.  Protonix 40 mg daily.  7.  Actos 30 mg daily.  8.  Zofran 8 mg p.r.n.  9.  Senokot p.r.n.   ALLERGIES:  The patient again has no known allergies.   SOCIAL HISTORY:  The patient is married, lives in the [place] area, has two daughters who are alive and well.  The patient is  retired  from the [employer] in November, 2005.   FAMILY MEDICAL HISTORY:  Notable for mother died with cancer.  Father died  with cirrhosis of the liver.  The patient has two sisters who are in good  health.   FAMILY HISTORY:  Positive for diabetes.   REVIEW OF SYSTEMS:  Notable for no recent fevers or chills.  The patient  denies headache, visual field changes.  Denies shortness of breath, chest  pains, nausea, or vomiting.  The patient has had some diarrhea.  Denies any  problems controlling the bladder.  He has no focal numbness, weakness in the  arm or legs.  Denies blackout episodes.   PHYSICAL EXAMINATION:  VITAL SIGNS:  Blood pressure 103/57, heart rate 55,  respiratory rate 18, temperature afebrile.  GENERAL:  This patient is a fairly well-developed black male who is alert  and cooperative at the time of examination.  HEENT EXAMINATION:  Head is atraumatic.  Eyes:  The pupil on the left is  post-surgical.  Cataract is present on the right.  Both pupils are round and  reactive. Disks at flat bilaterally.  NECK:  Supple with possible carotid bruit noted on the right.   RESPIRATORY EXAMINATION:  Clear.  CARDIOVASCULAR EXAMINATION:  Regular rate and rhythm, no obvious murmurs or  rubs noted.  EXTREMITIES:  Without significant edema.  NEUROLOGIC EXAMINATION:  Cranial nerves as above.  Facial symmetry is  present.  The patient has full extraocular movements.  The patient seems to  have some difficulty with picking up objects in the left inferior quadrant,  but aphasia makes it difficult to determine what he is actually perceiving  visually.  Significant perseveration is noted.  The patient has symmetric  pinprick sensation on the face, good strength of facial muscles and the  muscles of head turning.  Shoulder shrug was noted bilaterally.  Motor  testing reveals good strength in all fours, good symmetrical motor strength  at 5/5.  Sensory testing appears to be intact to pinprick and soft vibratory  sensation throughout.  The patient has ataxia/aphasia, difficulty performing  finger-nose-finger and heel-to-shin bilaterally.  Gait was not tested.  The  deep tendon reflexes were relatively symmetric.  Toes were neutral  bilaterally.  No drift is seen.  No asterixis is seen.   LABORATORY VALUES:  Notable for a white count of 6.1, hemoglobin 12.2,  hematocrit 35.6, platelets 170,000.  Sodium of 133, potassium 3.8, chloride  101, CO2 26, glucose 188, BUN 13, creatinine 1.6.  The patient has INR of  0.9.  Total bilirubin 1, alkaline phosphatase 85,  SGOT 15, SGPT 16, total  protein 7.1, albumin 3.4, calcium 8.8.  TSH 0.461.  Drug screen is  unremarkable.  Urinalysis reveals a specific gravity of 1.011, pH 6.5,  glucose 500 mg/dL.   An MRI scan of the brain is as above.   IMPRESSION:  1.  Sudden onset of aphasia syndrome with negative MRI scan of the brain.  2.  Diabetes.  3.  Hypertension.  4.  History of colon cancer, negative lymph nodes.   This patient has recently been on some form of chemotherapy agent.  The patient has had a sudden onset of aphasia  that has persisted, and the  clinical course is most consistent with a cerebrovascular event, but this is  not apparent by MRI scan.  Certainly, stroke-like events can occur with  negative MRI studies.  However, the significant aphasia generally involves a  large portion of brain  tissue and should be apparent, in most case, by MRI.  I will need to rule out other causes of aphasia such as unwitnessed seizure  event with postictal period.  The EEG study does show slowing of the left  hemisphere, again suggesting that a large portion of the left hemisphere is  not functioning properly.  Although no evidence of herpes encephalitis is  noted, this does need to be considered.  I need to consider the possibility  of unwitnessed hypoglycemia, but one of the notable features of the  examination is that the patient does not have altered sensorium.  No  evidence of a toxometabolic encephalopathy.  We will purse a bit further  workup at this point.   PLAN:  1.  Consider lumbar puncture.  2.  Empiric trial on Keppra.  3.  Blood work studies are pending including RPR, B12 levels, angiotensin-      converting enzyme levels, ammonia level, carotid Dopplers,      transesophageal echocardiogram pending, speech therapy evaluation.  I      supposed that perineoplastic syndrome does need to be considered.  May      evaluate for this in the future if the above studies are unremarkable      and the aphasia persists.  4.  I will follow the patient's clinical course while in house.       CKW/MEDQ  D:  05/17/2005  T:  05/17/2005  Job:  [id number]   cc:   Barney Drain, M.D.   [facility]

## 2011-03-30 NOTE — Op Note (Signed)
NAME:  Jonathan Baird, Jonathan Baird NO.:  [id number]   MEDICAL RECORD NO.:  [id number]          PATIENT TYPE:  AMB   LOCATION:  [place]                          FACILITY:  [facility]   PHYSICIAN:  Shellia Carwin, M.D. DATE OF BIRTH:  29-Dec-1932   DATE OF PROCEDURE:  11/16/2003  DATE OF DISCHARGE:                                 OPERATIVE REPORT   OPERATIVE PROCEDURE:  Insertion of X-Port venous access system, left  subclavian.   SURGEON:  Shellia Carwin, M.D.   ANESTHESIA:  MAC -- IV sedation, local 1% Xylocaine.   PREOPERATIVE DIAGNOSIS:  Cancer:  Needs chemotherapy and venous access for  same.   POSTOPERATIVE DIAGNOSIS:  Cancer:  Needs chemotherapy and venous access for  same.   OPERATIVE PROCEDURE:  The patient received 1.0 g of Ancef preop and had IV  sedation.  He was prepped, draped and positioned in the standard manner.  A  total of 25 mL of 1% Xylocaine were infiltrated for good local analgesia and  postop pain control.  Subclavian puncture made under the left clavicle  without difficulty and J-wire advanced down to the superior vena cava and  confirmed on fluoroscopy.  Then a subcu pocket was made in the left  subclavian area over the most proximal area of the pectoralis muscle.  The  area between the pocket and the enlarged puncture site was tunneled and the  catheter brought through, cut to the appropriate length and connected.  System flushed with heparin.  The reservoir anchored with 2-0 Prolene  sutures (2) to the deep subcu, then the dilator and introducer were placed  over the J-wire.  Fluoroscopy confirmed correct location and also length of  the catheter.  Then the J-wire and dilator were withdrawn, the catheter  passed through the introducer, which was peeled away.  This left the  catheter in good position as verified by good blood flow return, aspiration  and fluoroscopy.  The system  was then flushed with concentrated heparin and the wounds closed in  layers  with 3-0 Vicryl and Steri-Strips.  Sterile absorbent dressings were then  applied and the patient went to the recovery room from the operating room in  good condition.       MRL/MEDQ  D:  11/15/2004  T:  11/15/2004  Job:  [id number]   cc:   Waverly Ferrari, M.D.  [address] [place] [address]  Fax: [phone]   Marcy Panning, MD  Fax: [phone]   Merrilee Seashore, M.D.  [address] [place]  [place] [address]  Fax: [phone]

## 2011-03-30 NOTE — Discharge Summary (Signed)
NAME:  Jonathan Baird, Jonathan Baird NO.:  [id number]   MEDICAL RECORD NO.:  [id number]          PATIENT TYPE:  INP   LOCATION:  [id number]                         FACILITY:  [facility]   PHYSICIAN:  Shellia Carwin, M.D. DATE OF BIRTH:  June 28, 1933   DATE OF ADMISSION:  10/04/2004  DATE OF DISCHARGE:  10/11/2004                                 DISCHARGE SUMMARY   CHIEF COMPLAINT:  Right colon lesion.   HISTORY OF PRESENT ILLNESS:  This 76 year old gentleman is admitted for  elective resection of his right colon.  A screen Hemoccult was positive and  Dr. Lajoyce Corners colonoscoped him.  A lesion is found in the right colon which is a  dysplastic lesion and a TVA.  Because of its size, he was brought in for a  resection.  He has undergone an outpatient bowel prep.  He is hypertensive.   LABORATORY DATA:  Pathology:  Adenocarcinoma right colon.  Two lymph nodes  were positive.  The highest lymph nodes are negative.  A chest x-ray was no  active cardiopulmonary disease.  His EKG was sinus bradycardia.  He had a  preoperative  CAT scan showing no definite metastatic lesions.  A preop CEA  was 6.6.  A postop follow up CEA was slightly elevated at 2.4.  His CBC and  CMET were negative.  He was screened, B positive, but did not require any  transfusions.   HOSPITAL COURSE:  On the morning of admission, the patient underwent a right  colon resection without complications.  Postoperatively his catheters, IV's  and nasogastric tubes were withdrawn on schedule.  He had a little bit of  ileus but eventually had no difficulty opening up.  He regained bowel  function, was tolerating a diet, and had no apparent complications.  He  improved to the point where he was discharged on October 11, 2004, his  seventh postop day.  His sutures were removed.  He was given written  instructions.  He will continue his antihypertensive medication.  During the  hospital, his blood pressure varied and reached a high of about  200/99.   DISCHARGE DIAGNOSES:  1.  Adenocarcinoma right colon.  2.  Hypertension.   OPERATION:  Right colectomy--October 04, 2004.   COMPLICATIONS, CONSULTATIONS, INFECTIONS:  None.   CONDITION ON DISCHARGE:  Good.  He will be followed by myself, his primary  care physician Dr. Ashby Dawes and Dr. Lajoyce Corners.  Also referred to Dr. Humphrey Rolls at  [facility].       MRL/MEDQ  D:  10/11/2004  T:  10/11/2004  Job:  [id number]   cc:   Waverly Ferrari, M.D.  [place]  [place] [address]  Fax: [phone]   Merrilee Seashore, M.D.  [address] [place]  [place] [address]  Fax: [place], MD  Fax: [phone]

## 2011-03-30 NOTE — Op Note (Signed)
NAME:  Jonathan Baird, Jonathan Baird NO.:  [id number]   MEDICAL RECORD NO.:  [id number]          PATIENT TYPE:  INP   LOCATION:  2550                         FACILITY:  [facility]   PHYSICIAN:  Shellia Carwin, M.D. DATE OF BIRTH:  1933-01-03   DATE OF PROCEDURE:  10/04/2004  DATE OF DISCHARGE:                                 OPERATIVE REPORT   PREOPERATIVE DIAGNOSIS:  Right colon lesion--dysplastic.   POSTOPERATIVE DIAGNOSIS:  Large right colon lesion consistent with  carcinoma.   OPERATION PERFORMED:  Right colectomy.   SURGEON:  Shellia Carwin, M.D.   ASSISTANT:  Isabel Caprice. Hassell Done, MD   ANESTHESIA:  General.   INDICATIONS FOR PROCEDURE:  The patient is a 77 year old male admitted for  elective right colectomy.  Work-up because of heme positive stools found  several benign polyps and also a large dysplastic lesion in the colon.  His  preop CEA is 6.6.  Remainder of the colon was checked after a CT scan showed  possible sigmoid lesion and no abnormality was seen on careful exam with  flex procto.   OPERATIVE FINDINGS:  The patient's liver and small bowel were normal.  His  retroperitoneal organs were normal.  I felt the gallbladder, it had no  stones.  The stomach felt normal.  There was no aneurysm.  The right ureter  was identified and not injured.  The distal colon was carefully palpated and  I felt no abnormality from the rectum to the splenic flexure.  In the cecum,  was a firm mass that mobile, nonpenetrating.  It was approximately 4 cm in  size.  There were also some palpable lymph nodes that I removed that were in  the mesentery.   DESCRIPTION OF PROCEDURE:  Under satisfactory general endotracheal  anesthesia, having received preop heparin and Cefatan, the patient was  positioned, prepped and draped in standard fashion.  A nasogastric tube and  Foley catheter had been placed.  A midline incision made and carried into  the peritoneum and laparotomy revealed the  findings mentioned above.  The  right colon was mobilized by dividing the peritoneal reflection from the  distal ileum up to the hepatic flexure which was taken down between ties of  2-0 silk.  In this manner, the lesion was well mobilized and then I divided  the distal ileum with a GIA stapler and likewise the proximal transverse  colon.  Then the mesentery was divided between clamps and ties of 2-0 silk  and the large nodes encountered were removed.  We did not cause any  significant bleeding.  The middle colic vessel was identified.  I did find  two isolated more superior nodes and took them up towards the superior  mesenteric vessel with care taken not to injure those.  The vessels were  secured with 2-0 silk ties and suture ligatures of the same.  Hemostasis was  good.  The specimen was sent for pathology.  Then a GIA was fired to perform  an anastomosis between the tinea of the colon and antimesenteric border of  the small bowel.  Opening the bowel, we saw no active bleeding.  There  appeared to be a small hematoma of the colon and this was oversewn with 2-0  silk.  Then the stab wounds were closed with interrupted simple and Gambee  sutures of 3-0 silk.  A 3-0 silk suture was then used to oversew the cut  edges of the colon and bowel and actually appose them to each other.  At  this point a spring clamp was released and instruments changed as well as  gloves.  The mesentery was then closed with interrupted 3-0 silk suture.  Abdomen lavaged with saline, checked for  hemostasis.  Closure then performed with running #1 midline PDS suture.  Skin edges approximated with staples and sterile absorbent dressings  applied.  Blood loss negligible.  No complications.  Good urinary output  throughout.  To the recovery room from the operating room in good condition.       MRL/MEDQ  D:  10/04/2004  T:  10/04/2004  Job:  [id number]   cc:   Waverly Ferrari, M.D.  [place]  [place] [address]  Fax: [phone]   Merrilee Seashore, M.D.  [address] [place]  [place] [address]  Fax: [phone]

## 2011-03-30 NOTE — Op Note (Signed)
NAME:  Jonathan Baird, THAN NO.:  [id number]   MEDICAL RECORD NO.:  [id number]          PATIENT TYPE:  AMB   LOCATION:  DAY                          FACILITY:  [facility]   PHYSICIAN:  Lenn Cal, D.D.S.DATE OF BIRTH:  06/06/1933   DATE OF PROCEDURE:  02/15/2005  DATE OF DISCHARGE:                                 OPERATIVE REPORT   PREOPERATIVE DIAGNOSES:  1.  Colon cancer.  2.  Adjuvant chemotherapy.  3.  Chronic apical periodontitis.  4.  Multiple retained root segments.  5.  Chronic periodontitis.  6.  Accretions.  7.  Malocclusion with need for preprosthetic surgery.   POSTOPERATIVE DIAGNOSES:  1.  Colon cancer.  2.  Adjuvant chemotherapy.  3.  Chronic apical periodontitis.  4.  Multiple retained root segments.  5.  Chronic periodontitis.  6.  Accretions.  7.  Malocclusion with need for preprosthetic surgery.   OPERATIONS:  1.  Dental examination.  2.  Extraction of tooth #2, 3, 4, 5, 14, 18, 19, 25, and 32.  3.  Four quadrants of alveoloplasty.  4.  Maxillary right osseous tuberosity reduction.  5.  Gross debridement of the remaining dentition.   SURGEON:  Lenn Cal, D.D.S.   ASSISTANT:  Oneida Arenas (Art therapist).   ANESTHESIA:  General anesthesia via nasal endotracheal tube.   MEDICATIONS:  1.  Ampicillin 2 g IV prior to invasive dental procedures.  2.  Local anesthesia with utilization of 5 carpules, each containing 36 mg      of Xylocaine with 0.018 mg of epinephrine as well as 2 carpules, each      containing 9 mg of Marcaine with 0.009 mg of epinephrine.   SPECIMENS:  There were nine teeth which were discarded.   CULTURES:  None.   DRAINS:  None.   ESTIMATED BLOOD LOSS:  100 mL.   FLUIDS:  As per anesthesia report.   COMPLICATIONS:  None.   INDICATIONS:  The patient was recently diagnosed with colon cancer and had  been undergoing active chemotherapy.  The patient was referred for a dental  evaluation to rule out  dental infection which may affect the patient's  systemic health while on active chemotherapy.  The patient was then  evaluated and treatment planned for multiple extractions with alveoloplasty,  gross debridement of the remaining dentition, and preprosthetic surgery as  indicated. This treatment plan was formulated to decrease the risk and  complications associated with dental infection from affecting the patient's  systemic health.   OPERATIVE FINDINGS:  The patient was examined in operating room #6.  The  teeth were identified for extraction.  The patient was noted to be affected  by chronic periodontitis, chronic apical periodontitis, multiple retained  root segments, malocclusion, and the presence of accretions.  The  aforementioned necessitated the removed of multiple teeth with alveoloplasty  as indicated along with gross debridement of the remaining dentition and  upper right quadrant preprosthetic surgery.   DESCRIPTION OF PROCEDURE:  The patient was brought to the main operating  room #6. The patient was then placed in the supine  position on the operating  room table.  The patient was then intubated utilizing a nasal endotracheal  tube.  The patient then had a throat pack placed.  The patient was then  prepped and draped in the usual manner for a dental medicine procedure.  The  oral cavity was thoroughly examined with the findings as noted above.  The  patient was then readied for the dental medicine procedure as follows:   Local anesthesia was administered with total utilization of 5 carpules, each  containing 36 mg of Xylocaine with 0.018 mg of epinephrine as well as 2  carpules containing 9 mg of Marcaine with 0.009 mg of epinephrine.  This  local anesthesia was administered over the two-hour long procedure.   The maxillary quadrants were first approached.  Anesthesia was delivered as  previously described.  The 15 blade incision was made from the distal of the  maxillary  right tuberosity through the mesial of #6.  Surgical flap was then  carefully reflected.  Two retained root segments and tooth #2, 3, 4, and 5  were then removed without complication.  Alveoloplasty was then performed  utilizing rongeurs and bone file.  Further osseous tuberosity reduction was  then achieved utilizing rongeurs and bone file also.  The tissues were  approximated and trimmed appropriately.  The surgical site was then  irrigated with copious amounts of sterile saline.  The surgical site was  then closed on the distal of the maxillary right tuberosity through the  mesial of #5 utilizing 3-0 chromic gut suture in a continuous and  interrupted suture technique x1.   The maxillary left quadrant was then approached.  The 15 blade incision was  used to make a sulcular incision on the buccal aspect.  The surgical flap  was then carefully reflected.  The tooth #14 retained root segments were  then elevated and removed appropriately with a 150 forceps without  complications.  Alveoloplasty was then performed carefully utilizing  rongeurs and bone file.  The surgical site was then irrigated with copious  amounts of sterile saline.  The tissues were approximated and trimmed  appropriately.  The surgical site was then closed utilizing 3-0 chromic gut  suture material from the distal of #14 through the distal of #13 utilizing 3-  0 chromic gut suture in a continuous and interrupted suture technique x1.   At this point in time, the mandibular quadrants were approached.  Anesthesia  was delivered to the mandibular right and mandibular left quadrants  appropriately.  The mandibular left quadrant was then approached.  A 15  blade incision was made from the distal of #17 through the mesial of #20.  Surgical flap was then carefully reflected on the buccal and lingual aspects.  Retained root segments #18 and #19 were then elevated and the  mesial root of #18 and the mesial and distal root of  #19 were then removed  with the rongeurs appropriately.  Surgical handpiece and bur and copious  amounts of sterile saline were utilized to remove buccal and interseptal  bone around the retained root segment on the distal of #18.  This was then  elevated out and removed without complication.  Alveoloplasty was then  performed utilizing the rongeurs and bone file.  The surgical site was then  again irrigated with copious amounts of sterile saline.  The surgical site  was then closed utilizing 3-0 chromic gut suture material in a continuous  and interrupted suture technique from the mesial of #  17 through the distal  of #20.   The retained root segment of #25 was then removed with the rongeurs.  The  socket was curetted and compressed appropriately.  The socket was irrigated  with copious amounts of sterile saline.  The surgical site was then closed  utilizing one interrupted suture between tooth #24 and 26 appropriately.  The mandibular right posterior quadrant was then approached.  A 15 blade  incision was made from the distal of #32 through the mesial of #30.  Surgical flap was then carefully reflected down the buccal aspect.  An  appropriate amount of buccal and interseptal bone was removed around root  segments of #32.  These roots were then independently elevated out  appropriate utilizing a Cryer's elevator.  Alveoloplasty was then performed  utilizing the rongeurs and bone file.  The surgical site was then irrigated  with copious amounts of sterile saline.  The surgical site was then closed  utilizing a figure-of-eight suture technique in the area of  #32.  One  additional inter-proximal suture was placed between tooth #30 and 31  utilizing 3-0 chromic gut suture material.   The remaining teeth were then approached at this time.  A KaVo sonic scaler  was then utilized to remove significant accretions.  A series of hand  curettes were utilized to remove further accretions.  Th KaVo  sonic scaler  was then again utilized to remove further accretions as indicated.  At this  point in time, the entire mouth was irrigated with copious amounts of  sterile saline.  The patient was examined for complications, and seeing  none, the dental medicine procedure was deemed to be complete.  The throat  pack was removed at this time.  A series of 4 x 4 gauzes along with the  normal airway were placed at the request of the anesthesia team.  The  patient was then handed over to the anesthesia team for final disposition.  After an appropriate amount of time, the patient was extubated and taken to  the postanesthesia care unit with stable vital signs and good oxygenation  level.  All counts were correct for the dental medicine procedure.  The  patient will be given appropriate pain medication and will be followed up in one week for evaluation for suture removal as indicated.      RFK/MEDQ  D:  02/15/2005  T:  02/15/2005  Job:  [id number]   cc:   Marcy Panning, MD  Fax: [phone]

## 2011-03-30 NOTE — Op Note (Signed)
NAME:  Jonathan Baird, BENNE NO.:  [id number]   MEDICAL RECORD NO.:  [id number]          PATIENT TYPE:  AMB   LOCATION:  ENDO                         FACILITY:  [facility]   PHYSICIAN:  Waverly Ferrari, M.D.    DATE OF BIRTH:  1933/07/12   DATE OF PROCEDURE:  DATE OF DISCHARGE:                                 OPERATIVE REPORT   PROCEDURE PERFORMED:  Colonoscopy with biopsy and polypectomy.   ENDOSCOPIST:  Waverly Ferrari, M.D.   INDICATIONS FOR PROCEDURE:  Rectal bleeding.   ANESTHESIA:  Demerol 50 mg, Versed 5 mg.   DESCRIPTION OF PROCEDURE:  With the patient mildly sedated in the left  lateral decubitus position, the Olympus videoscopic colonoscope was inserted  in the rectum and passed under direct vision to the cecum, identified by the  ileocecal valve and base of the cecum, the latter of which was photographed  and there was a horseshoe shaped mass in the cecum which was then biopsied.  The colonoscope was then withdrawn taking circumferential views of the  colonic mucosa stopping first at 45 cm from the anal verge at which point a  small polyp was seen and photographed and removed using hot biopsy forceps  technique setting of 20/200 blended current.  We next stopped in the rectum  where there was another polyp.  This was removed using snare cautery  technique again on a setting of 20/200.  On retroflex view another polyp was  seen just above the anal canal and this was removed again using snare  cautery technique on a setting of 20/200 blended current and placed in  separate bottle.  The endoscope was straightened and withdrawn.  The  patient's vital signs and pulse oximeter remained stable.  The patient  tolerated the procedure well without apparent complications.   FINDINGS:  As described above, mass in the cecum, polyp at 45 cm from the  anal verge, polyp of the rectum and polyp labeled as distal rectum.   PLAN:  Await biopsy report.  The patient will call me for  results and follow  up with me as an outpatient.       GMO/MEDQ  D:  09/14/2004  T:  09/14/2004  Job:  [id number]

## 2011-03-30 NOTE — Op Note (Signed)
NAME:  ERCEL, SEPTER NO.:  [id number]   MEDICAL RECORD NO.:  [id number]          PATIENT TYPE:  AMB   LOCATION:  ENDO                         FACILITY:  [facility]   PHYSICIAN:  Waverly Ferrari, M.D.    DATE OF BIRTH:  [birth year]   DATE OF PROCEDURE:  10/11/2005  DATE OF DISCHARGE:                                 OPERATIVE REPORT   PROCEDURE:  Colonoscopy.   ENDOSCOPIST:  Waverly Ferrari, M.D.   INDICATIONS:  Colon cancer.   ANESTHESIA:  Demerol 50 mg, Versed 3 mg.   PROCEDURE:  With the patient mildly sedated in the left lateral decubitus  position, rectal examination was performed, which was unremarkable.  Subsequently, the Olympus videoscopic colonoscope was inserted in the rectum  and passed under direct vision to the neo-cecum and actually proximal to it.  The neo-cecum was photographed and from this point, the colonoscope was  slowly withdrawn, taking circumferential views of colonic mucosa, stopping  only in the rectum, which appeared normal on direct and showed unremarkable  on retroflexed view.  The endoscope was straightened and withdrawn.  The  patient's vital signs and pulse oximetry remained stable.  The patient  tolerated the procedure well without apparent complication.   FINDINGS:  Unremarkable colonoscopic examination to the neo-cecum.   PLAN:  Repeat examination possibly in 2 years.           ______________________________  Waverly Ferrari, M.D.     GMO/MEDQ  D:  10/11/2005  T:  10/12/2005  Job:  [id number]

## 2011-03-30 NOTE — Procedures (Signed)
[facility]  Patient:    Jonathan Baird, Jonathan Baird                        MRN: [id number] Proc. Date: 06/18/01 Adm. Date:  06/18/01 Attending:  Jim Desanctis, M.D. CC:         Jene Every, M.D.   Procedure Report  PROCEDURE PERFORMED:  Colonoscopy.  ENDOSCOPIST:  Jim Desanctis, M.D.  INDICATIONS FOR PROCEDURE:  Colon cancer screening.  ANESTHESIA:  Demerol 30 mg, Versed 3 mg.  DESCRIPTION OF PROCEDURE:  With the patient mildly sedated in the left lateral decubitus position, the Olympus videoscopic colonoscope was inserted in the rectum and passed under direct vision into the cecum.  The cecum was identified by the ileocecal valve and appendiceal orifice.  Attached to the ileocecal valve was a large polypoid mass that I thought was very likely to be malignant.  This was photographed and biopsied only.  No attempts were made to remove it in its entirety.  From this point, the colonoscope was slowly withdrawn, taking circumferential views of the entire colonic mucosa stopping next in the descending colon where a small polyp was seen that was cherry red and appeared to be fairly vascular.  This was photographed and using hot biopsy forceps technique, again a setting of 20/20 blended current, it too was removed.  We stopped next at approximately 20 cm from the anal verge at which point a polyp on a fixed stalk appeared to erupt from this area like a volcano eruption and it was also photographed.  It was removed using snare cautery technique.  There was some bleeding of the base and it was further cauterized with the tip of the snare.  Tissue was retrieved for pathology.  An adjacent small polyp was also removed using hot biopsy forceps technique again setting of 20/20 blended current.  We pulled the endoscope all the way back to the rectum and at the very anal verge we saw another polypoid mass. We photographed this as we withdrew through the anal canal.  We  reinserted the endoscope, placed it on retroflexion and we found a probably 1 to 1.5 cm polypoid lesion at this area that we photographed.  It was very close to the anal verge and because of concern for bleeding in this area I elected at this time at least to at least only biopsy it, call it a cold biopsy.  Multiple biopsies were taken.  The endoscope was straightened and withdrawn.  Patients vital signs and pulse oximeter remained stable.  The patient tolerated the procedure well and without apparent complications.  FINDINGS: 1. Mass in the cecum.  Probably malignant, biopsied. 2. Small polyp of descending colon just distal to the splenic flexure.  It    was removed in its entirety. 3. Small polyp and also a large volcanic eruptive type polyp at 20 cm    from the anal verge, certainly raises concern for malignancy at this point.    This was removed. 4. A large polypoid lesion right at the anal canal possibly malignant.  It too    was only biopsied at this time.  IMPRESSION:  As above.  PLAN: Will await biopsy report and treat accordingly.  Will proceed to CAT scan and CEA level at this time and will have the patient follow up with me for results of the biopsies in 48 hours.  Depending on findings, further studies and treatment will be directed.DD:  06/18/01  TD:  06/18/01 Job: [id number]

## 2011-05-11 ENCOUNTER — Encounter (HOSPITAL_BASED_OUTPATIENT_CLINIC_OR_DEPARTMENT_OTHER): Payer: Medicare Other | Admitting: Oncology

## 2011-05-11 ENCOUNTER — Other Ambulatory Visit: Payer: Self-pay | Admitting: Oncology

## 2011-05-11 DIAGNOSIS — R7309 Other abnormal glucose: Secondary | ICD-10-CM

## 2011-05-11 DIAGNOSIS — D649 Anemia, unspecified: Secondary | ICD-10-CM

## 2011-05-11 DIAGNOSIS — C189 Malignant neoplasm of colon, unspecified: Secondary | ICD-10-CM

## 2011-05-11 DIAGNOSIS — N289 Disorder of kidney and ureter, unspecified: Secondary | ICD-10-CM

## 2011-05-11 LAB — CBC WITH DIFFERENTIAL/PLATELET
LYMPH%: 19.6 % (ref 14.0–49.0)
MCH: 27.1 pg — ABNORMAL LOW (ref 27.2–33.4)
MCHC: 33.6 g/dL (ref 32.0–36.0)
MONO#: 0.4 10*3/uL (ref 0.1–0.9)
MONO%: 6.1 % (ref 0.0–14.0)
NEUT#: 5.1 10*3/uL (ref 1.5–6.5)
NEUT%: 72.6 % (ref 39.0–75.0)
RBC: 4.45 10*6/uL (ref 4.20–5.82)
RDW: 14.3 % (ref 11.0–14.6)

## 2011-05-11 LAB — COMPREHENSIVE METABOLIC PANEL
ALT: 14 U/L (ref 0–53)
Alkaline Phosphatase: 91 U/L (ref 39–117)
BUN: 23 mg/dL (ref 6–23)
Calcium: 9.4 mg/dL (ref 8.4–10.5)
Creatinine, Ser: 1.98 mg/dL — ABNORMAL HIGH (ref 0.50–1.35)
Sodium: 140 mEq/L (ref 135–145)
Total Bilirubin: 0.5 mg/dL (ref 0.3–1.2)
Total Protein: 7.7 g/dL (ref 6.0–8.3)

## 2011-05-11 LAB — CEA: CEA: 3.1 ng/mL (ref 0.0–5.0)

## 2011-05-17 ENCOUNTER — Encounter (HOSPITAL_BASED_OUTPATIENT_CLINIC_OR_DEPARTMENT_OTHER): Payer: Medicare Other | Admitting: Oncology

## 2011-05-17 DIAGNOSIS — C189 Malignant neoplasm of colon, unspecified: Secondary | ICD-10-CM

## 2011-05-17 DIAGNOSIS — D649 Anemia, unspecified: Secondary | ICD-10-CM

## 2011-10-20 ENCOUNTER — Telehealth: Payer: Self-pay | Admitting: Oncology

## 2011-10-20 NOTE — Telephone Encounter (Signed)
per pof 07/05 called pts home lmovm with appts for july2013. asked pt to rtn call to confirm appts

## 2012-05-16 ENCOUNTER — Other Ambulatory Visit (HOSPITAL_BASED_OUTPATIENT_CLINIC_OR_DEPARTMENT_OTHER): Payer: MEDICARE | Admitting: Lab

## 2012-05-16 DIAGNOSIS — C189 Malignant neoplasm of colon, unspecified: Secondary | ICD-10-CM

## 2012-05-16 DIAGNOSIS — D649 Anemia, unspecified: Secondary | ICD-10-CM

## 2012-05-16 LAB — CEA: CEA: 2.5 ng/mL (ref 0.0–5.0)

## 2012-05-16 LAB — COMPREHENSIVE METABOLIC PANEL
CO2: 27 mEq/L (ref 19–32)
Creatinine, Ser: 2.01 mg/dL — ABNORMAL HIGH (ref 0.50–1.35)
Glucose, Bld: 160 mg/dL — ABNORMAL HIGH (ref 70–99)
Total Bilirubin: 0.4 mg/dL (ref 0.3–1.2)
Total Protein: 7.2 g/dL (ref 6.0–8.3)

## 2012-05-16 LAB — CBC WITH DIFFERENTIAL/PLATELET
Eosinophils Absolute: 0.1 10*3/uL (ref 0.0–0.5)
HCT: 35.3 % — ABNORMAL LOW (ref 38.4–49.9)
LYMPH%: 21.1 % (ref 14.0–49.0)
MONO#: 0.4 10*3/uL (ref 0.1–0.9)
NEUT#: 5 10*3/uL (ref 1.5–6.5)
NEUT%: 70.6 % (ref 39.0–75.0)
Platelets: 224 10*3/uL (ref 140–400)
WBC: 7.1 10*3/uL (ref 4.0–10.3)

## 2012-05-23 ENCOUNTER — Encounter: Payer: Self-pay | Admitting: Oncology

## 2012-05-23 ENCOUNTER — Telehealth: Payer: Self-pay | Admitting: *Deleted

## 2012-05-23 ENCOUNTER — Ambulatory Visit (HOSPITAL_BASED_OUTPATIENT_CLINIC_OR_DEPARTMENT_OTHER): Payer: MEDICARE | Admitting: Oncology

## 2012-05-23 VITALS — BP 186/77 | HR 62 | Temp 98.3°F | Ht 70.0 in | Wt 194.2 lb

## 2012-05-23 DIAGNOSIS — D649 Anemia, unspecified: Secondary | ICD-10-CM

## 2012-05-23 DIAGNOSIS — N289 Disorder of kidney and ureter, unspecified: Secondary | ICD-10-CM

## 2012-05-23 DIAGNOSIS — E119 Type 2 diabetes mellitus without complications: Secondary | ICD-10-CM

## 2012-05-23 DIAGNOSIS — C189 Malignant neoplasm of colon, unspecified: Secondary | ICD-10-CM

## 2012-05-23 NOTE — Telephone Encounter (Signed)
Gave patient appointment for 05-14-2013 lab only apppointment  05-22-2012 md appointment printed out calendar and gave to the patient

## 2012-05-23 NOTE — Progress Notes (Signed)
OFFICE PROGRESS NOTE  Jonathan Baird, Jonathan Baird [id number] Dr. 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 There was lymphovascular invasion tumor stage was T3 N1 MX (stage III). Patient's preop CEA was 6.6. Tumor was sent for epidermal growth factor receptor studies 50% of the tumor cells were positive for the EGFR stain.  #2 Patient received 7 cycles of adjuvant 5-FU leucovorin and oxaliplatin every 2 weeks beginning January 2006. However patient developed renal insufficiency and his chemotherapy was changed to full fury he completed 3 cycles of this making a total of 10 cycles.  #3 patient also had a mini TIA.  CURRENT THERAPY:Observation  INTERVAL HISTORY: CHEYENE STUDE 78 y.o. male returns for Followup visit for his yearly exam. Overall he is doing well. His main concern is his diabetes he is developing diabetic neuropathy as well as renal insufficiency his creatinine is 2.0 today. His hemoglobin A1c apparently range is in the 8 range. He is doing his best to keep his blood sugars under control. He is seeing endocrinologist Dr. Wilson Singer. Patient from oncology point of view is doing extremely well. He had a recent colonoscopy and was negative.He denies any fevers chills night sweats headaches shortness of breath chest pains palpitations no hematuria hematochezia melena  or hemoptysis. Remainder of the 10 point review of systems is negative.  MEDICAL HISTORY: Past Medical History  Diagnosis Date  . Colon cancer 10/2004    stage III  . Diabetes mellitus     ALLERGIES:   has no known allergies.  MEDICATIONS:  Current Outpatient Prescriptions  Medication Sig Dispense Refill  . amLODipine (NORVASC) 2.5 MG tablet Take 2.5 mg by mouth daily.      Marland Kitchen glipiZIDE (GLUCOTROL) 5 MG tablet Take 5 mg by mouth 2 (two) times daily before a meal. 5mg  BID 2.5 once daily      . hydrochlorothiazide (MICROZIDE) 12.5 MG capsule Take 2.5 mg by mouth daily.      Marland Kitchen lisinopril (PRINIVIL,ZESTRIL) 40 MG tablet Take 40 mg by mouth daily.      . pravastatin (PRAVACHOL) 80 MG tablet Take 70 mg by mouth daily.      . saxagliptin HCl (ONGLYZA) 5 MG TABS tablet Take by mouth daily. Cut 5mg  in half        SURGICAL HISTORY:  Past Surgical History  Procedure Date  . Hemocolectomy 2005    REVIEW OF SYSTEMS:  Pertinent items are noted in HPI.   PHYSICAL EXAMINATION: General appearance: alert, cooperative and appears stated age Lymph nodes: Cervical, supraclavicular, and axillary nodes normal. Resp: clear to auscultation bilaterally and normal percussion bilaterally Cardio: regular rate and rhythm, S1, S2 normal, no murmur, click, rub or gallop GI: soft, non-tender; bowel sounds normal; no masses,  no organomegaly Extremities: extremities normal, atraumatic, no cyanosis or edema Neurologic: Grossly normal  ECOG PERFORMANCE STATUS: 1 - Symptomatic but completely ambulatory  Blood pressure 186/77, pulse 62, temperature 98.3 F (36.8 C), temperature source Oral, height 5'  10" (1.778 m), weight 194 lb 3.2 oz (88.089 kg).  LABORATORY DATA: Lab Results  Component Value Date   WBC 7.1 05/16/2012   HGB 11.7* 05/16/2012   HCT 35.3* 05/16/2012   MCV 78.4* 05/16/2012   PLT 224 05/16/2012      Chemistry      Component Value Date/Time   NA 139 05/16/2012 1321   NA 138 05/12/2010 1332   K  4.9 05/16/2012 1321   K 4.5 05/12/2010 1332   CL 105 05/16/2012 1321   CL 99 05/12/2010 1332   CO2 27 05/16/2012 1321   CO2 27 05/12/2010 1332   BUN 26* 05/16/2012 1321   BUN 26* 05/12/2010 1332   CREATININE 2.01* 05/16/2012 1321   CREATININE 1.8* 05/12/2010 1332      Component Value Date/Time   CALCIUM 8.9 05/16/2012 1321   CALCIUM 9.4 05/12/2010 1332   ALKPHOS 92 05/16/2012 1321   ALKPHOS 84 05/12/2010 1332   AST 15 05/16/2012 1321   AST 21 05/12/2010 1332   ALT 18 05/16/2012 1321   BILITOT 0.4 05/16/2012 1321   BILITOT 0.60 05/12/2010 1332       RADIOGRAPHIC STUDIES:  No results found.  ASSESSMENT: 78 year old gentleman with  #1 history of stage III colon carcinoma diagnosed November 2005. He received adjuvant chemotherapy with FOLFOX x7 cycles followed by 3 cycles of FOLFIRI. Overall he tolerated well he is without any evidence of recurrent disease his CEA is flat.  #2 patient is slightly anemic most likely due to anemia of chronic disease and renal insufficiency.  #3 renal insufficiency likely secondary to underlying diabetes.   PLAN:   #1 patient will continue to be seen here once a year we will do CBC CMET, and CEA.  #2 I have discussed the kidney function with the patient and he will speak to his primary care physician regarding this.   All questions were answered. The patient knows to call the clinic with any problems, questions or concerns. We can certainly see the patient much sooner if necessary.  I spent 30 minutes counseling the patient face to face. The total time spent in the appointment was 30 minutes.    Marcy Panning, MD Medical/Oncology [facility] [phone] (beeper) [phone] (Office)  05/23/2012, 11:46 AM

## 2012-05-23 NOTE — Patient Instructions (Addendum)
1. Doing well, no evidence of recurrence of colon cancer  2. Labs:  Results for TANVIR, CIRRINCIONE (MRN [id number]) as of 05/23/2012 11:55  Ref. Range 05/16/2012 13:21  Sodium Latest Range: 135-145 mEq/L 139  Potassium Latest Range: 3.5-5.3 mEq/L 4.9  Chloride Latest Range: 96-112 mEq/L 105  CO2 Latest Range: 19-32 mEq/L 27  BUN Latest Range: 6-23 mg/dL 26 (H)  Creat Latest Range: 0.50-1.35 mg/dL 2.01 (H)  Calcium Latest Range: 8.4-10.5 mg/dL 8.9  Glucose Latest Range: 70-99 mg/dL 160 (H)  Alkaline Phosphatase Latest Range: 39-117 U/L 92  Albumin Latest Range: 3.3-5.5 g/dL 4.2  AST Latest Range: 0-37 U/L 15  ALT Latest Range: 0-53 U/L 18  Total Protein Latest Range: 6.0-8.3 g/dL 7.2  Total Bilirubin Latest Range: 0.3-1.2 mg/dL 0.4  WBC Latest Range: 4.0-10.0 10e3/uL 7.1  RBC Latest Range: 4.20-5.82 10e6/uL 4.50  Hemoglobin Latest Range: 13.0-17.1 g/dL 11.7 (L)  HCT Latest Range: 38.4-49.9 % 35.3 (L)  MCV Latest Range: 79.3-98.0 fL 78.4 (L)  MCH Latest Range: 28.0-33.4 pg 25.9 (L)  MCHC Latest Range: 32.0-36.0 g/dL 33.1  RDW Latest Range: 11.0-14.6 % 15.1 (H)  Platelets Latest Range: 140-400 10e3/uL 224  NEUT% Latest Range: 39.0-75.0 % 70.6  LYMPH% Latest Range: 14.0-49.0 % 21.1  MONO% Latest Range: 0.0-14.0 % 6.2  EOS% Latest Range: 0.0-7.0 % 1.9  BASO% Latest Range: 0.0-2.0 % 0.2  NEUT# Latest Range: 1.5-6.5 10e3/uL 5.0  MONO# Latest Range: 0.1-0.9 10e3/uL 0.4  Eosinophils Absolute Latest Range: 0.0-0.5 10e3/uL 0.1  Basophils Absolute Latest Range: 0.0-0.1 10e3/uL 0.0  lymph# Latest Range: 0.9-3.3 10e3/uL 1.5  CEA Latest Range: 0.0-5.0 ng/mL 2.5  Your CEA is normal this is the tumor marker for colon cancer  3. I will see you back in 1 year with labs 1 week before the visit

## 2013-05-14 ENCOUNTER — Other Ambulatory Visit: Payer: MEDICARE | Admitting: Lab

## 2013-05-14 ENCOUNTER — Other Ambulatory Visit: Payer: Self-pay | Admitting: Emergency Medicine

## 2013-05-22 ENCOUNTER — Other Ambulatory Visit: Payer: MEDICARE | Admitting: Lab

## 2013-05-22 ENCOUNTER — Ambulatory Visit: Payer: MEDICARE | Admitting: Oncology

## 2013-05-25 ENCOUNTER — Telehealth: Payer: Self-pay | Admitting: *Deleted

## 2013-05-25 NOTE — Telephone Encounter (Signed)
Pt called to r/s his missed appts for 05/22/13. gv appt d/t for 05/29/13 labs @12  and ov @ 12:30pm. Pt is aware...td

## 2013-05-29 ENCOUNTER — Ambulatory Visit: Payer: Medicare Other | Admitting: Lab

## 2013-05-29 ENCOUNTER — Other Ambulatory Visit (HOSPITAL_BASED_OUTPATIENT_CLINIC_OR_DEPARTMENT_OTHER): Payer: Medicare Other | Admitting: Lab

## 2013-05-29 ENCOUNTER — Ambulatory Visit (HOSPITAL_BASED_OUTPATIENT_CLINIC_OR_DEPARTMENT_OTHER): Payer: Medicare Other | Admitting: Oncology

## 2013-05-29 ENCOUNTER — Telehealth: Payer: Self-pay | Admitting: *Deleted

## 2013-05-29 ENCOUNTER — Encounter: Payer: Self-pay | Admitting: Oncology

## 2013-05-29 ENCOUNTER — Other Ambulatory Visit: Payer: Self-pay | Admitting: Oncology

## 2013-05-29 VITALS — BP 130/57 | HR 60 | Temp 98.4°F | Resp 20 | Ht 70.0 in | Wt 184.8 lb

## 2013-05-29 DIAGNOSIS — C189 Malignant neoplasm of colon, unspecified: Secondary | ICD-10-CM

## 2013-05-29 DIAGNOSIS — D509 Iron deficiency anemia, unspecified: Secondary | ICD-10-CM

## 2013-05-29 LAB — COMPREHENSIVE METABOLIC PANEL (CC13)
Alkaline Phosphatase: 102 U/L (ref 40–150)
Creatinine: 2.1 mg/dL — ABNORMAL HIGH (ref 0.7–1.3)
Glucose: 138 mg/dl (ref 70–140)
Sodium: 137 mEq/L (ref 136–145)
Total Bilirubin: 0.37 mg/dL (ref 0.20–1.20)
Total Protein: 7.8 g/dL (ref 6.4–8.3)

## 2013-05-29 LAB — CBC WITH DIFFERENTIAL/PLATELET
EOS%: 1.6 % (ref 0.0–7.0)
MCH: 26.4 pg — ABNORMAL LOW (ref 27.2–33.4)
MCV: 78.3 fL — ABNORMAL LOW (ref 79.3–98.0)
MONO%: 5.6 % (ref 0.0–14.0)
RBC: 4.5 10*6/uL (ref 4.20–5.82)
RDW: 15.4 % — ABNORMAL HIGH (ref 11.0–14.6)

## 2013-05-29 LAB — FERRITIN CHCC: Ferritin: 230 ng/ml (ref 22–316)

## 2013-05-29 LAB — CEA: CEA: 2.8 ng/mL (ref 0.0–5.0)

## 2013-05-29 NOTE — Patient Instructions (Addendum)
Doing well  Mild anemia will check iron today  Discussed labs today  I will see you back in 1 year with labs prior to next visit

## 2013-05-29 NOTE — Telephone Encounter (Signed)
appts made and printed...td 

## 2013-06-21 NOTE — Progress Notes (Signed)
OFFICE PROGRESS NOTE  [place] [place] [address] Dr. 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 There was lymphovascular invasion tumor stage was T3 N1 MX (stage III). Patient's preop CEA was 6.6. Tumor was sent for epidermal growth factor receptor studies 50% of the tumor cells were positive for the EGFR stain.  #2 Patient received 7 cycles of adjuvant 5-FU leucovorin and oxaliplatin every 2 weeks beginning January 2006. However patient developed renal insufficiency and his chemotherapy was changed to full fury he completed 3 cycles of this making a total of 10 cycles.  #3 patient also had a mini TIA.  CURRENT THERAPY:Observation  INTERVAL HISTORY: Jonathan Baird 79 y.o. male returns for Followup visit for his yearly exam. Overall he is doing well. His main concern is his diabetes he is developing diabetic neuropathy as well as renal insufficiency his creatinine is 2.0 today. His hemoglobin A1c apparently range is in the 8 range. He is doing his best to keep his blood sugars under control. He is seeing endocrinologist Dr. Wilson Singer. Patient from oncology point of view is doing extremely well. He had a recent colonoscopy and was negative.He denies any fevers chills night sweats headaches shortness of breath chest pains palpitations no hematuria hematochezia melena  or hemoptysis. Remainder of the 10 point review of systems is negative.  MEDICAL HISTORY: Past Medical History  Diagnosis Date  . Colon cancer 10/2004    stage III  . Diabetes mellitus     ALLERGIES:  has No Known Allergies.  MEDICATIONS:  Current Outpatient Prescriptions  Medication Sig Dispense Refill  . amLODipine (NORVASC) 2.5 MG tablet Take 2.5 mg by mouth daily.      Marland Kitchen glipiZIDE (GLUCOTROL) 5 MG tablet Take 5 mg by mouth 2 (two) times daily before a meal. 5mg  BID 2.5 once daily      . hydrochlorothiazide (MICROZIDE) 12.5 MG capsule Take 2.5 mg by mouth daily.      Marland Kitchen lisinopril (PRINIVIL,ZESTRIL) 40 MG tablet Take 40 mg by mouth daily.      . pravastatin (PRAVACHOL) 80 MG tablet Take 70 mg by mouth daily.      . saxagliptin HCl (ONGLYZA) 5 MG TABS tablet Take by mouth daily. Cut 5mg  in half       No current facility-administered medications for this visit.    SURGICAL HISTORY:  Past Surgical History  Procedure Laterality Date  . Hemocolectomy  2005    REVIEW OF SYSTEMS:  Pertinent items are noted in HPI.   PHYSICAL EXAMINATION: General appearance: alert, cooperative and appears stated age Lymph nodes: Cervical, supraclavicular, and axillary nodes normal. Resp: clear to auscultation bilaterally and normal percussion bilaterally Cardio: regular rate and rhythm, S1, S2 normal, no murmur, click, rub or gallop GI: soft, non-tender; bowel sounds normal; no masses,  no organomegaly Extremities: extremities normal, atraumatic, no cyanosis or edema Neurologic: Grossly normal  ECOG PERFORMANCE STATUS: 1 - Symptomatic but completely ambulatory  Blood pressure 130/57, pulse 60,  temperature 98.4 F (36.9 C), temperature source Oral, resp. rate 20, height 5\' 10"  (1.778 m), weight 184 lb 12.8 oz (83.825 kg).  LABORATORY DATA: Lab Results  Component Value Date   WBC 9.3 05/29/2013   HGB 11.9* 05/29/2013   HCT 35.2* 05/29/2013   MCV 78.3* 05/29/2013   PLT 198 05/29/2013       Chemistry      Component Value Date/Time   NA 137 05/29/2013 1155   NA 139 05/16/2012 1321   NA 138 05/12/2010 1332   K 4.5 05/29/2013 1155   K 4.9 05/16/2012 1321   K 4.5 05/12/2010 1332   CL 105 05/16/2012 1321   CL 99 05/12/2010 1332   CO2 22 05/29/2013 1155   CO2 27 05/16/2012 1321   CO2 27 05/12/2010 1332   BUN 29.2* 05/29/2013 1155   BUN 26* 05/16/2012 1321   BUN 26* 05/12/2010 1332   CREATININE 2.1* 05/29/2013 1155   CREATININE 2.01* 05/16/2012 1321   CREATININE 1.8* 05/12/2010 1332      Component Value Date/Time   CALCIUM 9.3 05/29/2013 1155   CALCIUM 8.9 05/16/2012 1321   CALCIUM 9.4 05/12/2010 1332   ALKPHOS 102 05/29/2013 1155   ALKPHOS 92 05/16/2012 1321   ALKPHOS 84 05/12/2010 1332   AST 13 05/29/2013 1155   AST 15 05/16/2012 1321   AST 21 05/12/2010 1332   ALT 16 05/29/2013 1155   ALT 18 05/16/2012 1321   ALT 21 05/12/2010 1332   BILITOT 0.37 05/29/2013 1155   BILITOT 0.4 05/16/2012 1321   BILITOT 0.60 05/12/2010 1332       RADIOGRAPHIC STUDIES:  No results found.  ASSESSMENT: 79 year old gentleman with  #1 history of stage III colon carcinoma diagnosed November 2005. He received adjuvant chemotherapy with FOLFOX x7 cycles followed by 3 cycles of FOLFIRI. Overall he tolerated well he is without any evidence of recurrent disease his CEA is flat.  #2 patient is slightly anemic most likely due to anemia of chronic disease and renal insufficiency.  #3 renal insufficiency likely secondary to underlying diabetes.   PLAN:  #1 patient is overall doing well no evidence of recurrent disease.  #2 mild anemia to check his iron studies. We discussed the labs also.  #3 I will see patient back in one year with labs prior to next visit.   All questions were answered. The patient knows to call the clinic with any problems, questions or concerns. We can certainly see the patient much sooner if necessary.  I spent 25 minutes counseling the patient face to face. The total time spent in the appointment was 30  minutes.    Marcy Panning, MD Medical/Oncology [facility] [phone] (beeper) [phone] (Office)

## 2013-07-10 ENCOUNTER — Other Ambulatory Visit (HOSPITAL_COMMUNITY): Payer: Self-pay | Admitting: Nephrology

## 2013-07-10 DIAGNOSIS — N184 Chronic kidney disease, stage 4 (severe): Secondary | ICD-10-CM

## 2013-07-16 ENCOUNTER — Ambulatory Visit (HOSPITAL_COMMUNITY)
Admission: RE | Admit: 2013-07-16 | Discharge: 2013-07-16 | Disposition: A | Discharge status: Home / Self Care | Payer: Medicare Other | Source: Ambulatory Visit | Attending: Nephrology | Admitting: Nephrology

## 2013-07-16 DIAGNOSIS — N184 Chronic kidney disease, stage 4 (severe): Secondary | ICD-10-CM

## 2013-07-16 DIAGNOSIS — N189 Chronic kidney disease, unspecified: Secondary | ICD-10-CM | POA: Insufficient documentation

## 2013-10-01 ENCOUNTER — Encounter: Payer: Self-pay | Admitting: Nephrology

## 2013-10-02 ENCOUNTER — Encounter: Payer: Self-pay | Admitting: Nephrology

## 2014-05-19 ENCOUNTER — Telehealth: Payer: Self-pay | Admitting: Hematology and Oncology

## 2014-05-19 NOTE — Telephone Encounter (Signed)
, °

## 2014-05-31 ENCOUNTER — Other Ambulatory Visit: Payer: Medicare Other

## 2014-05-31 ENCOUNTER — Ambulatory Visit: Payer: Medicare Other | Admitting: Oncology

## 2014-08-17 ENCOUNTER — Telehealth: Payer: Self-pay | Admitting: Hematology and Oncology

## 2014-08-17 NOTE — Telephone Encounter (Signed)
cld & spoke to pt and adv of the new appt time & date-pt understood

## 2014-08-23 ENCOUNTER — Other Ambulatory Visit: Payer: Self-pay

## 2014-08-23 DIAGNOSIS — C189 Malignant neoplasm of colon, unspecified: Secondary | ICD-10-CM

## 2014-08-24 ENCOUNTER — Other Ambulatory Visit (HOSPITAL_BASED_OUTPATIENT_CLINIC_OR_DEPARTMENT_OTHER): Payer: Medicare Other

## 2014-08-24 ENCOUNTER — Ambulatory Visit (HOSPITAL_BASED_OUTPATIENT_CLINIC_OR_DEPARTMENT_OTHER): Payer: Medicare Other | Admitting: Hematology and Oncology

## 2014-08-24 ENCOUNTER — Other Ambulatory Visit: Payer: Self-pay

## 2014-08-24 VITALS — BP 159/65 | HR 80 | Temp 98.4°F | Resp 18 | Ht 70.0 in | Wt 181.5 lb

## 2014-08-24 DIAGNOSIS — C189 Malignant neoplasm of colon, unspecified: Secondary | ICD-10-CM

## 2014-08-24 DIAGNOSIS — E1129 Type 2 diabetes mellitus with other diabetic kidney complication: Secondary | ICD-10-CM

## 2014-08-24 LAB — CBC WITH DIFFERENTIAL/PLATELET
BASO%: 0.4 % (ref 0.0–2.0)
BASOS ABS: 0 10*3/uL (ref 0.0–0.1)
EOS%: 1.9 % (ref 0.0–7.0)
Eosinophils Absolute: 0.2 10*3/uL (ref 0.0–0.5)
HCT: 35.9 % — ABNORMAL LOW (ref 38.4–49.9)
HEMOGLOBIN: 11.8 g/dL — AB (ref 13.0–17.1)
LYMPH#: 1.2 10*3/uL (ref 0.9–3.3)
LYMPH%: 11.8 % — ABNORMAL LOW (ref 14.0–49.0)
MCH: 25.9 pg — AB (ref 27.2–33.4)
MCHC: 32.7 g/dL (ref 32.0–36.0)
MCV: 79.1 fL — ABNORMAL LOW (ref 79.3–98.0)
MONO#: 0.5 10*3/uL (ref 0.1–0.9)
MONO%: 5.1 % (ref 0.0–14.0)
NEUT#: 7.9 10*3/uL — ABNORMAL HIGH (ref 1.5–6.5)
NEUT%: 80.8 % — ABNORMAL HIGH (ref 39.0–75.0)
Platelets: 260 10*3/uL (ref 140–400)
RBC: 4.54 10*6/uL (ref 4.20–5.82)
RDW: 15.2 % — AB (ref 11.0–14.6)
WBC: 9.8 10*3/uL (ref 4.0–10.3)

## 2014-08-24 LAB — IRON AND TIBC CHCC
%SAT: 29 % (ref 20–55)
Iron: 63 ug/dL (ref 42–163)
TIBC: 221 ug/dL (ref 202–409)
UIBC: 158 ug/dL (ref 117–376)

## 2014-08-24 LAB — COMPREHENSIVE METABOLIC PANEL (CC13)
ALBUMIN: 3.7 g/dL (ref 3.5–5.0)
ALT: 18 U/L (ref 0–55)
AST: 13 U/L (ref 5–34)
Alkaline Phosphatase: 97 U/L (ref 40–150)
Anion Gap: 9 mEq/L (ref 3–11)
BUN: 28.9 mg/dL — ABNORMAL HIGH (ref 7.0–26.0)
CALCIUM: 9.5 mg/dL (ref 8.4–10.4)
CO2: 25 mEq/L (ref 22–29)
Chloride: 105 mEq/L (ref 98–109)
Creatinine: 2.4 mg/dL — ABNORMAL HIGH (ref 0.7–1.3)
Glucose: 232 mg/dl — ABNORMAL HIGH (ref 70–140)
POTASSIUM: 4.5 meq/L (ref 3.5–5.1)
Sodium: 139 mEq/L (ref 136–145)
Total Bilirubin: 0.33 mg/dL (ref 0.20–1.20)
Total Protein: 8 g/dL (ref 6.4–8.3)

## 2014-08-24 LAB — FERRITIN CHCC: Ferritin: 211 ng/ml (ref 22–316)

## 2014-08-24 NOTE — Assessment & Plan Note (Signed)
T3, N1, M0 stage IIIB adenocarcinoma of the colon status post hemicolectomy and adjuvant chemotherapy diagnosed in 2005.  Surveillance: Annual blood counts with CEA along with periodic colonoscopy. Patient has no symptoms of recurrence of colon cancer and this was exam does not reveal any abnormalities. We are waiting and CEA levels.  Survivorship: Discussed importance of exercise and eating more fruits and vegetables. Return to clinic in one year for followup with labs including CEA.
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
## 2014-08-25 ENCOUNTER — Ambulatory Visit: Payer: Medicare Other | Admitting: Hematology and Oncology

## 2014-08-25 ENCOUNTER — Other Ambulatory Visit: Payer: Medicare Other

## 2014-08-25 LAB — CEA: CEA: 3 ng/mL (ref 0.0–5.0)

## 2014-08-26 NOTE — Addendum Note (Signed)
Addended by: Prentiss Bells on: 08/26/2014 09:14 AM   Modules accepted: Orders

## 2014-08-27 ENCOUNTER — Telehealth: Payer: Self-pay

## 2014-08-27 ENCOUNTER — Telehealth: Payer: Self-pay | Admitting: Hematology and Oncology

## 2014-08-27 NOTE — Telephone Encounter (Signed)
s.w. pt and advised on OCT 2016...pt ok and aware °

## 2014-08-27 NOTE — Telephone Encounter (Signed)
Let pt know CEA is normal.  Pt voiced understanding.

## 2015-08-25 ENCOUNTER — Encounter: Payer: Self-pay | Admitting: Hematology and Oncology

## 2015-08-25 ENCOUNTER — Ambulatory Visit (HOSPITAL_BASED_OUTPATIENT_CLINIC_OR_DEPARTMENT_OTHER): Payer: Medicare Other | Admitting: Hematology and Oncology

## 2015-08-25 ENCOUNTER — Other Ambulatory Visit (HOSPITAL_BASED_OUTPATIENT_CLINIC_OR_DEPARTMENT_OTHER): Payer: Medicare Other

## 2015-08-25 VITALS — BP 144/58 | HR 57 | Temp 98.2°F | Resp 18 | Ht 70.0 in | Wt 182.4 lb

## 2015-08-25 DIAGNOSIS — Z85038 Personal history of other malignant neoplasm of large intestine: Secondary | ICD-10-CM | POA: Diagnosis not present

## 2015-08-25 DIAGNOSIS — E119 Type 2 diabetes mellitus without complications: Secondary | ICD-10-CM

## 2015-08-25 DIAGNOSIS — C189 Malignant neoplasm of colon, unspecified: Secondary | ICD-10-CM

## 2015-08-25 DIAGNOSIS — C186 Malignant neoplasm of descending colon: Secondary | ICD-10-CM

## 2015-08-25 LAB — COMPREHENSIVE METABOLIC PANEL (CC13)
ALBUMIN: 4 g/dL (ref 3.5–5.0)
ALT: 15 U/L (ref 0–55)
AST: 12 U/L (ref 5–34)
Alkaline Phosphatase: 98 U/L (ref 40–150)
Anion Gap: 9 mEq/L (ref 3–11)
BUN: 33.3 mg/dL — AB (ref 7.0–26.0)
CHLORIDE: 105 meq/L (ref 98–109)
CO2: 25 meq/L (ref 22–29)
Calcium: 9.4 mg/dL (ref 8.4–10.4)
Creatinine: 2.5 mg/dL — ABNORMAL HIGH (ref 0.7–1.3)
EGFR: 27 mL/min/{1.73_m2} — ABNORMAL LOW (ref >90)
Glucose: 218 mg/dl — ABNORMAL HIGH (ref 70–140)
POTASSIUM: 4.7 meq/L (ref 3.5–5.1)
Sodium: 139 mEq/L (ref 136–145)
Total Bilirubin: 0.39 mg/dL (ref 0.20–1.20)
Total Protein: 7.9 g/dL (ref 6.4–8.3)

## 2015-08-25 LAB — CBC WITH DIFFERENTIAL/PLATELET
BASO%: 0.5 % (ref 0.0–2.0)
BASOS ABS: 0 10*3/uL (ref 0.0–0.1)
EOS%: 4.1 % (ref 0.0–7.0)
Eosinophils Absolute: 0.4 10*3/uL (ref 0.0–0.5)
HCT: 36.4 % — ABNORMAL LOW (ref 38.4–49.9)
HEMOGLOBIN: 12 g/dL — AB (ref 13.0–17.1)
LYMPH#: 1.7 10*3/uL (ref 0.9–3.3)
LYMPH%: 18.3 % (ref 14.0–49.0)
MCH: 26.4 pg — ABNORMAL LOW (ref 27.2–33.4)
MCHC: 33 g/dL (ref 32.0–36.0)
MCV: 80 fL (ref 79.3–98.0)
MONO#: 0.5 10*3/uL (ref 0.1–0.9)
MONO%: 5.8 % (ref 0.0–14.0)
NEUT#: 6.7 10*3/uL — ABNORMAL HIGH (ref 1.5–6.5)
NEUT%: 71.3 % (ref 39.0–75.0)
Platelets: 265 10*3/uL (ref 140–400)
RBC: 4.55 10*6/uL (ref 4.20–5.82)
RDW: 15.4 % — ABNORMAL HIGH (ref 11.0–14.6)
WBC: 9.4 10*3/uL (ref 4.0–10.3)

## 2015-08-25 LAB — IRON AND TIBC CHCC
%SAT: 50 % (ref 20–55)
Iron: 115 ug/dL (ref 42–163)
TIBC: 233 ug/dL (ref 202–409)
UIBC: 117 ug/dL (ref 117–376)

## 2015-08-25 LAB — CEA: CEA: 3.5 ng/mL (ref 0.0–5.0)

## 2015-08-25 LAB — FERRITIN CHCC: Ferritin: 220 ng/ml (ref 22–316)

## 2015-08-25 NOTE — Progress Notes (Signed)
Patient Care Team: Merrilee Seashore, MD as PCP - General (Internal Medicine)  DIAGNOSIS: Colon cancer [facility])   Staging form: Colon and Rectum, AJCC 7th Edition     Clinical: T3, N1, M0 - Unsigned     Pathologic: Stage IIIB (T3, N1, cM0) - Signed by Rulon Eisenmenger, MD on 08/24/2014   SUMMARY OF ONCOLOGIC HISTORY:   Colon cancer ([place])   10/04/2004 Surgery Right hemicolectomy: Moderate to poorly differentiated colon cancer with mucinous features associated with villous adenoma 2/11 lymph nodes positive with lymphovascular invasion T3, N1, M0 stage III , EGFR 50% cells positive   11/24/2004 -  Chemotherapy 7 cycles of FOLFOX, patient of renal insufficiency chemotherapy changed to FOLFIRI x3 cycles stopped do too many TIA    CHIEF COMPLIANT: follow-up of colon cancer  INTERVAL HISTORY: Jonathan Baird is a 81 year old with above-mentioned history of right hemicolectomy for stage III colon cancer. He received adjuvant chemotherapy which was stopped early due to TIA. He has been here annually for surveillance and his annual CEA has always been normal. He is battling diabetes and its complications. He denies abdominal pain nausea vomiting diarrhea or constipation.  REVIEW OF SYSTEMS:   Constitutional: Denies fevers, chills or abnormal weight loss Eyes: Denies blurriness of vision Ears, nose, mouth, throat, and face: Denies mucositis or sore throat Respiratory: Denies cough, dyspnea or wheezes Cardiovascular: Denies palpitation, chest discomfort or lower extremity swelling Gastrointestinal:  Denies nausea, heartburn or change in bowel habits Skin: Denies abnormal skin rashes Lymphatics: Denies new lymphadenopathy or easy bruising Neurological:diabetic peripheral neuropathy Behavioral/Psych: memory problems   All other systems were reviewed with the patient and are negative.  I have reviewed the past medical history, past surgical history, social history and family history with the patient  and they are unchanged from previous note.  ALLERGIES:  has No Known Allergies.  MEDICATIONS:  Current Outpatient Prescriptions  Medication Sig Dispense Refill  . amLODipine (NORVASC) 2.5 MG tablet Take 2.5 mg by mouth daily.    Marland Kitchen glipiZIDE (GLUCOTROL) 5 MG tablet Take 5 mg by mouth 2 (two) times daily before a meal. 16m BID 2.5 once daily    . hydrochlorothiazide (MICROZIDE) 12.5 MG capsule Take 2.5 mg by mouth daily.    .Marland Kitchenlisinopril (PRINIVIL,ZESTRIL) 40 MG tablet Take 40 mg by mouth daily.    . pravastatin (PRAVACHOL) 80 MG tablet Take 70 mg by mouth daily.    . saxagliptin HCl (ONGLYZA) 5 MG TABS tablet Take by mouth daily. Cut 576min half    . sodium bicarbonate 650 MG tablet Take 650 mg by mouth 3 (three) times daily.  5   No current facility-administered medications for this visit.    PHYSICAL EXAMINATION: ECOG PERFORMANCE STATUS: 2 - Symptomatic, <50% confined to bed  Filed Vitals:   08/25/15 1356  BP: 144/58  Pulse: 57  Temp: 98.2 F (36.8 C)  Resp: 18   Filed Weights   08/25/15 1356  Weight: 182 lb 6.4 oz (82.736 kg)    GENERAL:alert, no distress and comfortable SKIN: skin color, texture, turgor are normal, no rashes or significant lesions EYES: normal, Conjunctiva are pink and non-injected, sclera clear OROPHARYNX:no exudate, no erythema and lips, buccal mucosa, and tongue normal  NECK: supple, thyroid normal size, non-tender, without nodularity LYMPH:  no palpable lymphadenopathy in the cervical, axillary or inguinal LUNGS: clear to auscultation and percussion with normal breathing effort HEART: regular rate & rhythm and no murmurs and no lower extremity edema ABDOMEN:abdomen soft,  non-tender and normal bowel sounds Musculoskeletal:no cyanosis of digits and no clubbing  NEURO: alert & oriented x 3 with fluent speech, peripheral neuropathy from diabetes   LABORATORY DATA:  I have reviewed the data as listed   Chemistry      Component Value Date/Time    NA 139 08/24/2014 0957   NA 139 05/16/2012 1321   NA 138 05/12/2010 1332   K 4.5 08/24/2014 0957   K 4.9 05/16/2012 1321   K 4.5 05/12/2010 1332   CL 105 05/16/2012 1321   CL 99 05/12/2010 1332   CO2 25 08/24/2014 0957   CO2 27 05/16/2012 1321   CO2 27 05/12/2010 1332   BUN 28.9* 08/24/2014 0957   BUN 26* 05/16/2012 1321   BUN 26* 05/12/2010 1332   CREATININE 2.4* 08/24/2014 0957   CREATININE 2.01* 05/16/2012 1321   CREATININE 1.8* 05/12/2010 1332      Component Value Date/Time   CALCIUM 9.5 08/24/2014 0957   CALCIUM 8.9 05/16/2012 1321   CALCIUM 9.4 05/12/2010 1332   ALKPHOS 97 08/24/2014 0957   ALKPHOS 92 05/16/2012 1321   ALKPHOS 84 05/12/2010 1332   AST 13 08/24/2014 0957   AST 15 05/16/2012 1321   AST 21 05/12/2010 1332   ALT 18 08/24/2014 0957   ALT 18 05/16/2012 1321   ALT 21 05/12/2010 1332   BILITOT 0.33 08/24/2014 0957   BILITOT 0.4 05/16/2012 1321   BILITOT 0.60 05/12/2010 1332       Lab Results  Component Value Date   WBC 9.4 08/25/2015   HGB 12.0* 08/25/2015   HCT 36.4* 08/25/2015   MCV 80.0 08/25/2015   PLT 265 08/25/2015   NEUTROABS 6.7* 08/25/2015   ASSESSMENT & PLAN:  Colon cancer T3, N1, M0 stage IIIB adenocarcinoma of the colon status post hemicolectomy and adjuvant chemotherapy diagnosed in 2005.  Surveillance: Annual blood counts with CEA along with periodic colonoscopy. Patient has no symptoms of recurrence of colon cancer and today's exam does not reveal any abnormalities. We are waiting on CEA levels.  Survivorship: Discussed importance of exercise and eating more fruits and vegetables. Diabetes mellitus With renal impairment: Patient follows with nephrology and primary care.  Plan: Since the patient is 11 years from diagnosis, he will follow with his primary care physician Dr. Ashby Dawes for his annual checkups and CEA blood draw. We are happy to see him on an as-needed basis going forward.  No orders of the defined types were  placed in this encounter.   The patient has a good understanding of the overall plan. he agrees with it. he will call with any problems that may develop before the next visit here.   Rulon Eisenmenger, MD 08/25/2015

## 2015-08-25 NOTE — Assessment & Plan Note (Signed)
T3, N1, M0 stage IIIB adenocarcinoma of the colon status post hemicolectomy and adjuvant chemotherapy diagnosed in 2005.  Surveillance: Annual blood counts with CEA along with periodic colonoscopy. Patient has no symptoms of recurrence of colon cancer and today's exam does not reveal any abnormalities. We are waiting on CEA levels.  Survivorship: Discussed importance of exercise and eating more fruits and vegetables. Diabetes mellitus With renal impairment: Patient follows with nephrology and primary care.  Return to clinic in one year for followup with labs including CEA.

## 2018-03-19 ENCOUNTER — Ambulatory Visit (INDEPENDENT_AMBULATORY_CARE_PROVIDER_SITE_OTHER): Payer: Medicare Other | Admitting: Podiatry

## 2018-03-19 ENCOUNTER — Encounter: Payer: Self-pay | Admitting: Podiatry

## 2018-03-19 DIAGNOSIS — L84 Corns and callosities: Secondary | ICD-10-CM | POA: Diagnosis not present

## 2018-03-19 DIAGNOSIS — M2041 Other hammer toe(s) (acquired), right foot: Secondary | ICD-10-CM | POA: Diagnosis not present

## 2018-03-19 DIAGNOSIS — B351 Tinea unguium: Secondary | ICD-10-CM | POA: Diagnosis not present

## 2018-03-19 DIAGNOSIS — E0843 Diabetes mellitus due to underlying condition with diabetic autonomic (poly)neuropathy: Secondary | ICD-10-CM | POA: Diagnosis not present

## 2018-03-19 DIAGNOSIS — M2042 Other hammer toe(s) (acquired), left foot: Secondary | ICD-10-CM

## 2018-03-19 DIAGNOSIS — M21619 Bunion of unspecified foot: Secondary | ICD-10-CM | POA: Diagnosis not present

## 2018-03-19 DIAGNOSIS — M79676 Pain in unspecified toe(s): Secondary | ICD-10-CM | POA: Diagnosis not present

## 2018-03-22 NOTE — Progress Notes (Signed)
    Subjective: Patient is a 84 y.o. male presenting to the office today as a new patient with a chief complaint of painful callus lesions to the bilateral sub-fifth MPJs that have been present for several months. Bearing weight and walking increases the pain. He has not done anything for treatment.   Patient also complains of elongated, thickened nails that cause pain while ambulating in shoes. He is unable to trim his own nails. Patient presents today for further treatment and evaluation.  Past Medical History:  Diagnosis Date  . Colon cancer ([place]) 10/2004   stage III  . Diabetes mellitus     Objective:  Physical Exam General: Alert and oriented x3 in no acute distress  Dermatology: Hyperkeratotic lesions present on the bilateral sub-fifth MPJs. Pain on palpation with a central nucleated core noted. Skin is warm, dry and supple bilateral lower extremities. Negative for open lesions or macerations. Nails are tender, long, thickened and dystrophic with subungual debris, consistent with onychomycosis, 1-5 bilateral. No signs of infection noted.  Vascular: Palpable pedal pulses bilaterally. No edema or erythema noted. Capillary refill within normal limits.  Neurological: Epicritic and protective threshold diminished bilaterally.   Musculoskeletal Exam: Pain on palpation at the keratotic lesion noted. Clinical evidence of bunion deformity noted to the respective foot. There is a moderate pain on palpation range of motion of the first MPJ. Lateral deviation of the hallux noted consistent with hallux abductovalgus. Hammertoe contracture also noted on clinical exam to digits 2-5 of the bilateral feet. Symptomatic pain on palpation and range of motion also noted to the metatarsal phalangeal joints of the respective hammertoe digits.  Assessment: 1. Onychodystrophic nails 1-5 bilateral with hyperkeratosis of nails.  2. Onychomycosis of nail due to dermatophyte bilateral 3. Pre-ulcerative callus  lesions noted to the bilateral sub-fifth MPJs 4. HAV w/ bunion deformity bilateral 5. Hammertoe deformity digits 2-5 bilateral     Plan of Care:  1. Patient evaluated. 2. Excisional debridement of keratoic lesion using a chisel blade was performed without incident.  3. Dressed with light dressing. 4. Mechanical debridement of nails 1-5 bilaterally performed using a nail nipper. Filed with dremel without incident.  5. Appointment with Liliane Channel for diabetic shoes.  6. Patient is to return to the clinic in 3 months.   Edrick Kins, DPM [facility]  Dr. Edrick Kins, [place]                                        [place], [place] [address]                Office [phone]  Fax [phone]

## 2018-04-08 ENCOUNTER — Ambulatory Visit: Payer: Medicare Other | Admitting: Orthotics

## 2018-04-08 DIAGNOSIS — L84 Corns and callosities: Secondary | ICD-10-CM

## 2018-04-08 DIAGNOSIS — M2042 Other hammer toe(s) (acquired), left foot: Secondary | ICD-10-CM

## 2018-04-08 DIAGNOSIS — M2041 Other hammer toe(s) (acquired), right foot: Secondary | ICD-10-CM

## 2018-04-08 DIAGNOSIS — M21619 Bunion of unspecified foot: Secondary | ICD-10-CM

## 2018-04-08 NOTE — Progress Notes (Signed)
Patient here today for DBS per dr. Amalia Hailey. Patient presents with L84 painful callus sub 5th left as well as HT and HAV.  {patietn measured 9.5 on brannock and chose [id number] as shoe.

## 2018-05-13 ENCOUNTER — Ambulatory Visit (INDEPENDENT_AMBULATORY_CARE_PROVIDER_SITE_OTHER): Payer: Medicare Other | Admitting: Orthotics

## 2018-05-13 DIAGNOSIS — M2041 Other hammer toe(s) (acquired), right foot: Secondary | ICD-10-CM | POA: Diagnosis not present

## 2018-05-13 DIAGNOSIS — E0843 Diabetes mellitus due to underlying condition with diabetic autonomic (poly)neuropathy: Secondary | ICD-10-CM | POA: Diagnosis not present

## 2018-05-13 DIAGNOSIS — M2042 Other hammer toe(s) (acquired), left foot: Secondary | ICD-10-CM | POA: Diagnosis not present

## 2018-05-13 DIAGNOSIS — M21619 Bunion of unspecified foot: Secondary | ICD-10-CM | POA: Diagnosis not present

## 2018-05-23 NOTE — Progress Notes (Signed)

## 2018-06-25 ENCOUNTER — Encounter: Payer: Medicare Other | Admitting: Podiatry

## 2018-07-01 NOTE — Progress Notes (Signed)
This encounter was created in error - please disregard.

## 2018-12-15 DIAGNOSIS — I10 Essential (primary) hypertension: Secondary | ICD-10-CM | POA: Diagnosis not present

## 2018-12-15 DIAGNOSIS — E1165 Type 2 diabetes mellitus with hyperglycemia: Secondary | ICD-10-CM | POA: Diagnosis not present

## 2019-01-27 DIAGNOSIS — I739 Peripheral vascular disease, unspecified: Secondary | ICD-10-CM | POA: Diagnosis not present

## 2019-01-27 DIAGNOSIS — E1165 Type 2 diabetes mellitus with hyperglycemia: Secondary | ICD-10-CM | POA: Diagnosis not present

## 2019-01-27 DIAGNOSIS — E1121 Type 2 diabetes mellitus with diabetic nephropathy: Secondary | ICD-10-CM | POA: Diagnosis not present

## 2019-01-27 DIAGNOSIS — I10 Essential (primary) hypertension: Secondary | ICD-10-CM | POA: Diagnosis not present

## 2019-02-02 DIAGNOSIS — E1165 Type 2 diabetes mellitus with hyperglycemia: Secondary | ICD-10-CM | POA: Diagnosis not present

## 2019-02-02 DIAGNOSIS — I129 Hypertensive chronic kidney disease with stage 1 through stage 4 chronic kidney disease, or unspecified chronic kidney disease: Secondary | ICD-10-CM | POA: Diagnosis not present

## 2019-02-02 DIAGNOSIS — E782 Mixed hyperlipidemia: Secondary | ICD-10-CM | POA: Diagnosis not present

## 2019-02-05 DIAGNOSIS — I779 Disorder of arteries and arterioles, unspecified: Secondary | ICD-10-CM | POA: Diagnosis not present

## 2019-02-05 DIAGNOSIS — E1121 Type 2 diabetes mellitus with diabetic nephropathy: Secondary | ICD-10-CM | POA: Diagnosis not present

## 2019-02-05 DIAGNOSIS — I129 Hypertensive chronic kidney disease with stage 1 through stage 4 chronic kidney disease, or unspecified chronic kidney disease: Secondary | ICD-10-CM | POA: Diagnosis not present

## 2019-02-05 DIAGNOSIS — E1165 Type 2 diabetes mellitus with hyperglycemia: Secondary | ICD-10-CM | POA: Diagnosis not present

## 2019-02-05 DIAGNOSIS — E11319 Type 2 diabetes mellitus with unspecified diabetic retinopathy without macular edema: Secondary | ICD-10-CM | POA: Diagnosis not present

## 2019-02-05 DIAGNOSIS — N184 Chronic kidney disease, stage 4 (severe): Secondary | ICD-10-CM | POA: Diagnosis not present

## 2019-02-05 DIAGNOSIS — I739 Peripheral vascular disease, unspecified: Secondary | ICD-10-CM | POA: Diagnosis not present

## 2019-02-05 DIAGNOSIS — E1151 Type 2 diabetes mellitus with diabetic peripheral angiopathy without gangrene: Secondary | ICD-10-CM | POA: Diagnosis not present

## 2019-02-05 DIAGNOSIS — Z0000 Encounter for general adult medical examination without abnormal findings: Secondary | ICD-10-CM | POA: Diagnosis not present

## 2019-02-05 DIAGNOSIS — E782 Mixed hyperlipidemia: Secondary | ICD-10-CM | POA: Diagnosis not present

## 2019-02-26 DIAGNOSIS — E1165 Type 2 diabetes mellitus with hyperglycemia: Secondary | ICD-10-CM | POA: Diagnosis not present

## 2019-02-26 DIAGNOSIS — E782 Mixed hyperlipidemia: Secondary | ICD-10-CM | POA: Diagnosis not present

## 2019-02-26 DIAGNOSIS — I129 Hypertensive chronic kidney disease with stage 1 through stage 4 chronic kidney disease, or unspecified chronic kidney disease: Secondary | ICD-10-CM | POA: Diagnosis not present

## 2019-03-12 DIAGNOSIS — E11319 Type 2 diabetes mellitus with unspecified diabetic retinopathy without macular edema: Secondary | ICD-10-CM | POA: Diagnosis not present

## 2019-03-12 DIAGNOSIS — I129 Hypertensive chronic kidney disease with stage 1 through stage 4 chronic kidney disease, or unspecified chronic kidney disease: Secondary | ICD-10-CM | POA: Diagnosis not present

## 2019-03-12 DIAGNOSIS — E1165 Type 2 diabetes mellitus with hyperglycemia: Secondary | ICD-10-CM | POA: Diagnosis not present

## 2019-03-12 DIAGNOSIS — E782 Mixed hyperlipidemia: Secondary | ICD-10-CM | POA: Diagnosis not present

## 2019-05-22 DIAGNOSIS — E11319 Type 2 diabetes mellitus with unspecified diabetic retinopathy without macular edema: Secondary | ICD-10-CM | POA: Diagnosis not present

## 2019-05-22 DIAGNOSIS — I129 Hypertensive chronic kidney disease with stage 1 through stage 4 chronic kidney disease, or unspecified chronic kidney disease: Secondary | ICD-10-CM | POA: Diagnosis not present

## 2019-05-22 DIAGNOSIS — E1151 Type 2 diabetes mellitus with diabetic peripheral angiopathy without gangrene: Secondary | ICD-10-CM | POA: Diagnosis not present

## 2019-05-22 DIAGNOSIS — E1165 Type 2 diabetes mellitus with hyperglycemia: Secondary | ICD-10-CM | POA: Diagnosis not present

## 2019-05-22 DIAGNOSIS — I779 Disorder of arteries and arterioles, unspecified: Secondary | ICD-10-CM | POA: Diagnosis not present

## 2019-05-22 DIAGNOSIS — E782 Mixed hyperlipidemia: Secondary | ICD-10-CM | POA: Diagnosis not present

## 2019-06-24 DIAGNOSIS — D631 Anemia in chronic kidney disease: Secondary | ICD-10-CM | POA: Diagnosis not present

## 2019-06-24 DIAGNOSIS — E872 Acidosis: Secondary | ICD-10-CM | POA: Diagnosis not present

## 2019-06-24 DIAGNOSIS — N189 Chronic kidney disease, unspecified: Secondary | ICD-10-CM | POA: Diagnosis not present

## 2019-06-24 DIAGNOSIS — N2581 Secondary hyperparathyroidism of renal origin: Secondary | ICD-10-CM | POA: Diagnosis not present

## 2019-06-24 DIAGNOSIS — E875 Hyperkalemia: Secondary | ICD-10-CM | POA: Diagnosis not present

## 2019-06-24 DIAGNOSIS — E11319 Type 2 diabetes mellitus with unspecified diabetic retinopathy without macular edema: Secondary | ICD-10-CM | POA: Diagnosis not present

## 2019-06-24 DIAGNOSIS — I129 Hypertensive chronic kidney disease with stage 1 through stage 4 chronic kidney disease, or unspecified chronic kidney disease: Secondary | ICD-10-CM | POA: Diagnosis not present

## 2019-06-24 DIAGNOSIS — N184 Chronic kidney disease, stage 4 (severe): Secondary | ICD-10-CM | POA: Diagnosis not present

## 2019-08-11 DIAGNOSIS — N2581 Secondary hyperparathyroidism of renal origin: Secondary | ICD-10-CM | POA: Diagnosis not present

## 2019-08-11 DIAGNOSIS — N184 Chronic kidney disease, stage 4 (severe): Secondary | ICD-10-CM | POA: Diagnosis not present

## 2019-08-13 DIAGNOSIS — Z794 Long term (current) use of insulin: Secondary | ICD-10-CM | POA: Diagnosis not present

## 2019-08-13 DIAGNOSIS — E782 Mixed hyperlipidemia: Secondary | ICD-10-CM | POA: Diagnosis not present

## 2019-08-13 DIAGNOSIS — E1165 Type 2 diabetes mellitus with hyperglycemia: Secondary | ICD-10-CM | POA: Diagnosis not present

## 2019-08-13 DIAGNOSIS — I10 Essential (primary) hypertension: Secondary | ICD-10-CM | POA: Diagnosis not present

## 2019-08-27 DIAGNOSIS — I10 Essential (primary) hypertension: Secondary | ICD-10-CM | POA: Diagnosis not present

## 2019-08-27 DIAGNOSIS — E1165 Type 2 diabetes mellitus with hyperglycemia: Secondary | ICD-10-CM | POA: Diagnosis not present

## 2019-08-27 DIAGNOSIS — T383X5A Adverse effect of insulin and oral hypoglycemic [antidiabetic] drugs, initial encounter: Secondary | ICD-10-CM | POA: Diagnosis not present

## 2019-08-27 DIAGNOSIS — E782 Mixed hyperlipidemia: Secondary | ICD-10-CM | POA: Diagnosis not present

## 2019-08-27 DIAGNOSIS — E160 Drug-induced hypoglycemia without coma: Secondary | ICD-10-CM | POA: Diagnosis not present

## 2019-08-27 DIAGNOSIS — Z794 Long term (current) use of insulin: Secondary | ICD-10-CM | POA: Diagnosis not present

## 2019-09-10 DIAGNOSIS — N184 Chronic kidney disease, stage 4 (severe): Secondary | ICD-10-CM | POA: Diagnosis not present

## 2019-09-10 DIAGNOSIS — I739 Peripheral vascular disease, unspecified: Secondary | ICD-10-CM | POA: Diagnosis not present

## 2019-09-10 DIAGNOSIS — Z7189 Other specified counseling: Secondary | ICD-10-CM | POA: Diagnosis not present

## 2019-09-10 DIAGNOSIS — E1121 Type 2 diabetes mellitus with diabetic nephropathy: Secondary | ICD-10-CM | POA: Diagnosis not present

## 2019-09-10 DIAGNOSIS — E1165 Type 2 diabetes mellitus with hyperglycemia: Secondary | ICD-10-CM | POA: Diagnosis not present

## 2019-09-10 DIAGNOSIS — I779 Disorder of arteries and arterioles, unspecified: Secondary | ICD-10-CM | POA: Diagnosis not present

## 2019-09-10 DIAGNOSIS — E1151 Type 2 diabetes mellitus with diabetic peripheral angiopathy without gangrene: Secondary | ICD-10-CM | POA: Diagnosis not present

## 2019-09-10 DIAGNOSIS — E11319 Type 2 diabetes mellitus with unspecified diabetic retinopathy without macular edema: Secondary | ICD-10-CM | POA: Diagnosis not present

## 2019-11-11 DIAGNOSIS — E1165 Type 2 diabetes mellitus with hyperglycemia: Secondary | ICD-10-CM | POA: Diagnosis not present

## 2019-11-26 DIAGNOSIS — E1165 Type 2 diabetes mellitus with hyperglycemia: Secondary | ICD-10-CM | POA: Diagnosis not present

## 2019-11-26 DIAGNOSIS — Z794 Long term (current) use of insulin: Secondary | ICD-10-CM | POA: Diagnosis not present

## 2019-11-26 DIAGNOSIS — I779 Disorder of arteries and arterioles, unspecified: Secondary | ICD-10-CM | POA: Diagnosis not present

## 2019-11-26 DIAGNOSIS — I129 Hypertensive chronic kidney disease with stage 1 through stage 4 chronic kidney disease, or unspecified chronic kidney disease: Secondary | ICD-10-CM | POA: Diagnosis not present

## 2019-12-24 DIAGNOSIS — E1165 Type 2 diabetes mellitus with hyperglycemia: Secondary | ICD-10-CM | POA: Diagnosis not present

## 2019-12-28 DIAGNOSIS — N2581 Secondary hyperparathyroidism of renal origin: Secondary | ICD-10-CM | POA: Diagnosis not present

## 2019-12-28 DIAGNOSIS — E11319 Type 2 diabetes mellitus with unspecified diabetic retinopathy without macular edema: Secondary | ICD-10-CM | POA: Diagnosis not present

## 2019-12-28 DIAGNOSIS — N189 Chronic kidney disease, unspecified: Secondary | ICD-10-CM | POA: Diagnosis not present

## 2019-12-28 DIAGNOSIS — D631 Anemia in chronic kidney disease: Secondary | ICD-10-CM | POA: Diagnosis not present

## 2019-12-28 DIAGNOSIS — I129 Hypertensive chronic kidney disease with stage 1 through stage 4 chronic kidney disease, or unspecified chronic kidney disease: Secondary | ICD-10-CM | POA: Diagnosis not present

## 2019-12-28 DIAGNOSIS — E875 Hyperkalemia: Secondary | ICD-10-CM | POA: Diagnosis not present

## 2019-12-28 DIAGNOSIS — E872 Acidosis: Secondary | ICD-10-CM | POA: Diagnosis not present

## 2019-12-28 DIAGNOSIS — N184 Chronic kidney disease, stage 4 (severe): Secondary | ICD-10-CM | POA: Diagnosis not present

## 2020-02-15 DIAGNOSIS — I10 Essential (primary) hypertension: Secondary | ICD-10-CM | POA: Diagnosis not present

## 2020-02-15 DIAGNOSIS — N183 Chronic kidney disease, stage 3 unspecified: Secondary | ICD-10-CM | POA: Diagnosis not present

## 2020-02-15 DIAGNOSIS — E1151 Type 2 diabetes mellitus with diabetic peripheral angiopathy without gangrene: Secondary | ICD-10-CM | POA: Diagnosis not present

## 2020-02-15 DIAGNOSIS — E11319 Type 2 diabetes mellitus with unspecified diabetic retinopathy without macular edema: Secondary | ICD-10-CM | POA: Diagnosis not present

## 2020-02-15 DIAGNOSIS — I779 Disorder of arteries and arterioles, unspecified: Secondary | ICD-10-CM | POA: Diagnosis not present

## 2020-02-15 DIAGNOSIS — E1165 Type 2 diabetes mellitus with hyperglycemia: Secondary | ICD-10-CM | POA: Diagnosis not present

## 2020-02-15 DIAGNOSIS — E1121 Type 2 diabetes mellitus with diabetic nephropathy: Secondary | ICD-10-CM | POA: Diagnosis not present

## 2020-02-15 DIAGNOSIS — I739 Peripheral vascular disease, unspecified: Secondary | ICD-10-CM | POA: Diagnosis not present

## 2020-02-18 DIAGNOSIS — Z0000 Encounter for general adult medical examination without abnormal findings: Secondary | ICD-10-CM | POA: Diagnosis not present

## 2020-02-18 DIAGNOSIS — N184 Chronic kidney disease, stage 4 (severe): Secondary | ICD-10-CM | POA: Diagnosis not present

## 2020-02-18 DIAGNOSIS — I739 Peripheral vascular disease, unspecified: Secondary | ICD-10-CM | POA: Diagnosis not present

## 2020-02-18 DIAGNOSIS — Z794 Long term (current) use of insulin: Secondary | ICD-10-CM | POA: Diagnosis not present

## 2020-02-18 DIAGNOSIS — E1151 Type 2 diabetes mellitus with diabetic peripheral angiopathy without gangrene: Secondary | ICD-10-CM | POA: Diagnosis not present

## 2020-02-18 DIAGNOSIS — E11319 Type 2 diabetes mellitus with unspecified diabetic retinopathy without macular edema: Secondary | ICD-10-CM | POA: Diagnosis not present

## 2020-02-18 DIAGNOSIS — I129 Hypertensive chronic kidney disease with stage 1 through stage 4 chronic kidney disease, or unspecified chronic kidney disease: Secondary | ICD-10-CM | POA: Diagnosis not present

## 2020-02-18 DIAGNOSIS — E1121 Type 2 diabetes mellitus with diabetic nephropathy: Secondary | ICD-10-CM | POA: Diagnosis not present

## 2020-02-18 DIAGNOSIS — E1165 Type 2 diabetes mellitus with hyperglycemia: Secondary | ICD-10-CM | POA: Diagnosis not present

## 2020-03-03 DIAGNOSIS — E1151 Type 2 diabetes mellitus with diabetic peripheral angiopathy without gangrene: Secondary | ICD-10-CM | POA: Diagnosis not present

## 2020-03-03 DIAGNOSIS — I779 Disorder of arteries and arterioles, unspecified: Secondary | ICD-10-CM | POA: Diagnosis not present

## 2020-03-03 DIAGNOSIS — E782 Mixed hyperlipidemia: Secondary | ICD-10-CM | POA: Diagnosis not present

## 2020-03-03 DIAGNOSIS — Z794 Long term (current) use of insulin: Secondary | ICD-10-CM | POA: Diagnosis not present

## 2020-03-03 DIAGNOSIS — I10 Essential (primary) hypertension: Secondary | ICD-10-CM | POA: Diagnosis not present

## 2020-03-03 DIAGNOSIS — E1165 Type 2 diabetes mellitus with hyperglycemia: Secondary | ICD-10-CM | POA: Diagnosis not present

## 2020-07-27 DIAGNOSIS — E1165 Type 2 diabetes mellitus with hyperglycemia: Secondary | ICD-10-CM | POA: Diagnosis not present

## 2020-08-04 ENCOUNTER — Encounter (INDEPENDENT_AMBULATORY_CARE_PROVIDER_SITE_OTHER): Payer: Medicare Other | Admitting: Ophthalmology

## 2020-08-10 ENCOUNTER — Other Ambulatory Visit: Payer: Self-pay

## 2020-08-10 ENCOUNTER — Encounter (INDEPENDENT_AMBULATORY_CARE_PROVIDER_SITE_OTHER): Payer: Self-pay | Admitting: Ophthalmology

## 2020-08-10 ENCOUNTER — Ambulatory Visit (INDEPENDENT_AMBULATORY_CARE_PROVIDER_SITE_OTHER): Payer: MEDICARE | Admitting: Ophthalmology

## 2020-08-10 DIAGNOSIS — H35371 Puckering of macula, right eye: Secondary | ICD-10-CM | POA: Diagnosis not present

## 2020-08-10 DIAGNOSIS — Z794 Long term (current) use of insulin: Secondary | ICD-10-CM | POA: Diagnosis not present

## 2020-08-10 DIAGNOSIS — H353134 Nonexudative age-related macular degeneration, bilateral, advanced atrophic with subfoveal involvement: Secondary | ICD-10-CM | POA: Diagnosis not present

## 2020-08-10 DIAGNOSIS — H35043 Retinal micro-aneurysms, unspecified, bilateral: Secondary | ICD-10-CM | POA: Diagnosis not present

## 2020-08-10 DIAGNOSIS — E113553 Type 2 diabetes mellitus with stable proliferative diabetic retinopathy, bilateral: Secondary | ICD-10-CM

## 2020-08-10 NOTE — Assessment & Plan Note (Signed)

## 2020-08-10 NOTE — Progress Notes (Signed)
08/10/2020     CHIEF COMPLAINT Patient presents for Retina Follow Up   HISTORY OF PRESENT ILLNESS: Jonathan Baird is a 86 y.o. male who presents to the clinic today for:   HPI    Retina Follow Up    Patient presents with  Dry AMD.  In both eyes.  Severity is moderate.  Duration of 2 years.  Since onset it is stable.  I, the attending physician,  performed the HPI with the patient and updated documentation appropriately.          Comments    2 Year Dry AMD f\u OU. FP  Pt c/o vision being worse. Pt states he recently started Trulicity and noticed a change in vision after that. BGL: 104 this AM A1C: 8       Last edited by Tilda Franco on 08/10/2020 10:06 AM. (History)      Referring physician: Merrilee Seashore, [address] [place],  [place] [address]  HISTORICAL INFORMATION:   Selected notes from the MEDICAL RECORD NUMBER    Lab Results  Component Value Date   HGBA1C 6.8 (H) 05/13/2006     CURRENT MEDICATIONS: No current outpatient medications on file. (Ophthalmic Drugs)   No current facility-administered medications for this visit. (Ophthalmic Drugs)   Current Outpatient Medications (Other)  Medication Sig  . amLODipine (NORVASC) 10 MG tablet Take 10 mg by mouth daily.  Marland Kitchen amLODipine (NORVASC) 2.5 MG tablet Take 2.5 mg by mouth daily.  . calcitRIOL (ROCALTROL) 0.25 MCG capsule   . glipiZIDE (GLUCOTROL) 5 MG tablet Take 5 mg by mouth 2 (two) times daily before a meal. 5mg  BID 2.5 once daily  . hydrochlorothiazide (MICROZIDE) 12.5 MG capsule Take 2.5 mg by mouth daily.  Marland Kitchen JANUVIA 25 MG tablet   . lisinopril (PRINIVIL,ZESTRIL) 40 MG tablet Take 40 mg by mouth daily.  . pravastatin (PRAVACHOL) 80 MG tablet Take 70 mg by mouth daily.  . saxagliptin HCl (ONGLYZA) 5 MG TABS tablet Take by mouth daily. Cut 5mg  in half  . sodium bicarbonate 650 MG tablet Take 650 mg by mouth 3 (three) times daily.  Alveda Reasons 2.5 MG TABS tablet TAKE 1 TABLET  BY MOUTH TWICE A DAY. TAKE WITH ASPIRIN 81MG  DAILY.   No current facility-administered medications for this visit. (Other)      REVIEW OF SYSTEMS: ROS    Positive for: Endocrine   Last edited by Tilda Franco on 08/10/2020 10:06 AM. (History)       ALLERGIES No Known Allergies  PAST MEDICAL HISTORY Past Medical History:  Diagnosis Date  . Colon cancer ([place]) 10/2004   stage III  . Diabetes mellitus    Past Surgical History:  Procedure Laterality Date  . hemocolectomy  2005    FAMILY HISTORY Family History  Problem Relation Age of Onset  . Cancer Mother   . Diabetes Father   . Cancer Sister     SOCIAL HISTORY Social History   Tobacco Use  . Smoking status: Never Smoker  . Smokeless tobacco: Never Used  Substance Use Topics  . Alcohol use: No  . Drug use: No         OPHTHALMIC EXAM:  Base Eye Exam    Visual Acuity (Snellen - Linear)      Right Left   Dist [place] 20/200 20/80 -1   Dist ph [place] 20/100 20/80 +       Tonometry (Tonopen, 10:12 AM)  Right Left   Pressure 18 21       Pupils      Pupils Dark Light Shape React APD   Right PERRL 2 2 Round Minimal None   Left PERRL 2 2 Round Minimal None       Visual Fields (Counting fingers)      Left Right    Full Full       Neuro/Psych    Oriented x3: Yes   Mood/Affect: Normal       Dilation    Both eyes: 1.0% Mydriacyl, 2.5% Phenylephrine @ 10:12 AM        Slit Lamp and Fundus Exam    External Exam      Right Left   External Normal Normal       Slit Lamp Exam      Right Left   Lids/Lashes Normal Normal   Conjunctiva/Sclera White and quiet White and quiet   Cornea Clear Clear   Anterior Chamber Deep and quiet Deep and quiet   Iris Round and reactive Round and reactive   Anterior Vitreous Normal Normal       Fundus Exam      Right Left   Posterior Vitreous Clear, vitrectomized Clear, vitrectomized   Disc Normal Normal   C/D Ratio 0.4 0.4   Macula Good focal laser  photocoagulation, geographic atrophy, some lesion creep temporal to the fovea Good focal laser photocoagulation, geographic atrophy, some lesion creep into the fovea   Vessels PDR-quiet PDR-quiet   Periphery PRP 360, quiescent NVE PRP 360, quiescent NVE          IMAGING AND PROCEDURES  Imaging and Procedures for 08/10/20  Color Fundus Photography Optos - OU - Both Eyes       Right Eye Progression has been stable. Disc findings include increased cup to disc ratio, pallor.   Left Eye Progression has been stable.   Notes OD, good PRP, good focal laser treatment temporal to the macula with lesion creep, also some areas of geographic atrophy from dry ARMD.  No active neovascularization  OS with somewhat hazy media however this is not confirmed on clinical examination.,  Good PRP, lesion creep and geographic atrophy may explain visual acuity change noted symptomatically by the patient.  PDR remains quiet in the media overall clear                ASSESSMENT/PLAN:  Controlled diabetes mellitus with stable proliferative retinopathy of both eyes ([place]) The nature of regressed proliferative diabetic retinopathy was discussed with the patient. The patient was advised to maintain good glucose, blood pressure, monitor kidney function and serum lipid control as advised by personal physician. Rare risk for reactivation of progression exist with untreated severe anemia, untreated renal failure, untreated heart failure, and smoking. Complete avoidance of smoking was recommended. The chance of recurrent proliferative diabetic retinopathy was discussed as well as the chance of vitreous hemorrhage for which further treatments may be necessary.   Explained to the patient that the quiescent  proliferative diabetic retinopathy disease is unlikely to ever worsen.  Worsening factors would include however severe anemia, hypertension out-of-control or impending renal failure.  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 The results of the AREDS 2 study was discussed with the patient. A diet rich in dark leafy green vegetables was advised and specific recommendations  were made regarding supplements with AREDS 2 formulation . Control of hypertension and serum cholesterol may slow the disease. Smoking cessation is mandatory to slow the disease and diminish the risk of progressing to wet age related macular degeneration. The patient was instructed in the use of an [place] and was told to return immediately for any changes in the Grid. Stressed to the patient do not rub eyes      ICD-10-CM   1. Controlled type 2 diabetes mellitus with stable proliferative retinopathy of both eyes, with long-term current use of insulin (HCC)  H65.7903 Color Fundus Photography Optos - OU - Both Eyes   Z79.4   2. Right epiretinal membrane  H35.371   3. 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 degeneration of both eyes with subfoveal involvement  H35.3134 Color Fundus Photography Optos - OU - Both Eyes  4. Retinal microaneurysm of both eyes  H35.043     1.  2.  3.  Ophthalmic Meds Ordered this visit:  No orders of the defined types were placed in this encounter.      Return in about 9 months (around 05/10/2021) for OCT, DILATE OU, COLOR FP.  Patient Instructions  Patient asked to report any new profound decrease in vision or distortion in either eye    Explained the diagnoses, plan, and follow up with the patient and they expressed understanding.  Patient expressed understanding of the importance of proper follow up care.   Clent Demark Verlyn Lambert M.D. Diseases & Surgery of the Retina and Vitreous [facility] [place] 08/10/20     Abbreviations: M myopia (nearsighted); A astigmatism; H hyperopia (farsighted); P  presbyopia; Mrx spectacle prescription;  CTL contact lenses; OD right eye; OS left eye; OU both eyes  XT exotropia; ET esotropia; PEK punctate epithelial keratitis; PEE punctate epithelial erosions; DES dry eye syndrome; MGD meibomian gland dysfunction; ATs artificial tears; PFAT's preservative free artificial tears; [place] nuclear sclerotic cataract; PSC posterior subcapsular cataract; ERM epi-retinal membrane; PVD posterior vitreous detachment; RD retinal detachment; DM diabetes mellitus; DR diabetic retinopathy; NPDR non-proliferative diabetic retinopathy; PDR proliferative diabetic retinopathy; CSME clinically significant macular edema; DME diabetic macular edema; dbh dot blot hemorrhages; CWS cotton wool spot; POAG primary open angle glaucoma; C/D cup-to-disc ratio; HVF humphrey visual field; GVF goldmann visual field; OCT optical coherence tomography; IOP intraocular pressure; BRVO Branch retinal vein occlusion; CRVO central retinal vein occlusion; CRAO central retinal artery occlusion; BRAO branch retinal artery occlusion; RT retinal tear; SB scleral buckle; PPV pars plana vitrectomy; VH Vitreous hemorrhage; PRP panretinal laser photocoagulation; IVK intravitreal kenalog; VMT vitreomacular traction; MH Macular hole;  NVD neovascularization of the disc; NVE neovascularization elsewhere; AREDS age related eye disease study; ARMD age related macular degeneration; POAG primary open angle glaucoma; EBMD epithelial/anterior basement membrane dystrophy; ACIOL anterior chamber intraocular lens; IOL intraocular lens; PCIOL posterior chamber intraocular lens; Phaco/IOL phacoemulsification with intraocular lens placement; [place] photorefractive keratectomy; LASIK laser assisted in situ keratomileusis; HTN hypertension; DM diabetes mellitus; COPD chronic obstructive pulmonary disease

## 2020-08-10 NOTE — Patient Instructions (Signed)
Patient asked to report any new profound decrease in vision or distortion in either eye

## 2020-08-10 NOTE — Assessment & Plan Note (Signed)

## 2020-08-18 DIAGNOSIS — I739 Peripheral vascular disease, unspecified: Secondary | ICD-10-CM | POA: Diagnosis not present

## 2020-08-18 DIAGNOSIS — E1165 Type 2 diabetes mellitus with hyperglycemia: Secondary | ICD-10-CM | POA: Diagnosis not present

## 2020-08-18 DIAGNOSIS — N184 Chronic kidney disease, stage 4 (severe): Secondary | ICD-10-CM | POA: Diagnosis not present

## 2020-08-18 DIAGNOSIS — I10 Essential (primary) hypertension: Secondary | ICD-10-CM | POA: Diagnosis not present

## 2020-08-18 DIAGNOSIS — I129 Hypertensive chronic kidney disease with stage 1 through stage 4 chronic kidney disease, or unspecified chronic kidney disease: Secondary | ICD-10-CM | POA: Diagnosis not present

## 2020-08-24 DIAGNOSIS — E1165 Type 2 diabetes mellitus with hyperglycemia: Secondary | ICD-10-CM | POA: Diagnosis not present

## 2020-08-25 DIAGNOSIS — E11319 Type 2 diabetes mellitus with unspecified diabetic retinopathy without macular edema: Secondary | ICD-10-CM | POA: Diagnosis not present

## 2020-08-25 DIAGNOSIS — E1165 Type 2 diabetes mellitus with hyperglycemia: Secondary | ICD-10-CM | POA: Diagnosis not present

## 2020-08-25 DIAGNOSIS — I779 Disorder of arteries and arterioles, unspecified: Secondary | ICD-10-CM | POA: Diagnosis not present

## 2020-08-25 DIAGNOSIS — E1151 Type 2 diabetes mellitus with diabetic peripheral angiopathy without gangrene: Secondary | ICD-10-CM | POA: Diagnosis not present

## 2020-08-25 DIAGNOSIS — E782 Mixed hyperlipidemia: Secondary | ICD-10-CM | POA: Diagnosis not present

## 2020-08-25 DIAGNOSIS — I129 Hypertensive chronic kidney disease with stage 1 through stage 4 chronic kidney disease, or unspecified chronic kidney disease: Secondary | ICD-10-CM | POA: Diagnosis not present

## 2020-08-25 DIAGNOSIS — Z23 Encounter for immunization: Secondary | ICD-10-CM | POA: Diagnosis not present

## 2020-10-04 DIAGNOSIS — Z23 Encounter for immunization: Secondary | ICD-10-CM | POA: Diagnosis not present

## 2020-10-13 ENCOUNTER — Encounter (INDEPENDENT_AMBULATORY_CARE_PROVIDER_SITE_OTHER): Payer: MEDICARE | Admitting: Ophthalmology

## 2020-10-17 DIAGNOSIS — E11319 Type 2 diabetes mellitus with unspecified diabetic retinopathy without macular edema: Secondary | ICD-10-CM | POA: Diagnosis not present

## 2020-10-17 DIAGNOSIS — E875 Hyperkalemia: Secondary | ICD-10-CM | POA: Diagnosis not present

## 2020-10-17 DIAGNOSIS — D631 Anemia in chronic kidney disease: Secondary | ICD-10-CM | POA: Diagnosis not present

## 2020-10-17 DIAGNOSIS — N2581 Secondary hyperparathyroidism of renal origin: Secondary | ICD-10-CM | POA: Diagnosis not present

## 2020-10-17 DIAGNOSIS — N184 Chronic kidney disease, stage 4 (severe): Secondary | ICD-10-CM | POA: Diagnosis not present

## 2020-10-17 DIAGNOSIS — E872 Acidosis: Secondary | ICD-10-CM | POA: Diagnosis not present

## 2020-10-17 DIAGNOSIS — I129 Hypertensive chronic kidney disease with stage 1 through stage 4 chronic kidney disease, or unspecified chronic kidney disease: Secondary | ICD-10-CM | POA: Diagnosis not present

## 2021-01-02 ENCOUNTER — Other Ambulatory Visit: Payer: Self-pay

## 2021-01-02 ENCOUNTER — Ambulatory Visit (INDEPENDENT_AMBULATORY_CARE_PROVIDER_SITE_OTHER): Payer: MEDICARE | Admitting: Podiatry

## 2021-01-02 DIAGNOSIS — B351 Tinea unguium: Secondary | ICD-10-CM

## 2021-01-02 DIAGNOSIS — M79675 Pain in left toe(s): Secondary | ICD-10-CM

## 2021-01-02 DIAGNOSIS — M79674 Pain in right toe(s): Secondary | ICD-10-CM

## 2021-01-02 DIAGNOSIS — E0843 Diabetes mellitus due to underlying condition with diabetic autonomic (poly)neuropathy: Secondary | ICD-10-CM

## 2021-01-02 DIAGNOSIS — L989 Disorder of the skin and subcutaneous tissue, unspecified: Secondary | ICD-10-CM

## 2021-01-02 NOTE — Progress Notes (Signed)
    Subjective: Patient is a 87 y.o. male presenting to the office today as a new patient with a chief complaint of painful callus lesions to the bilateral sub-fifth MPJs that have been present for several months. Bearing weight and walking increases the pain. He has not done anything for treatment.   Patient also complains of elongated, thickened nails that cause pain while ambulating in shoes. He is unable to trim his own nails. Patient presents today for further treatment and evaluation.  Past Medical History:  Diagnosis Date  . Colon cancer ([place]) 10/2004   stage III  . Diabetes mellitus     Objective:  Physical Exam General: Alert and oriented x3 in no acute distress  Dermatology: Hyperkeratotic lesions present on the bilateral sub-fifth MPJs. Pain on palpation with a central nucleated core noted. Skin is warm, dry and supple bilateral lower extremities. Negative for open lesions or macerations. Nails are tender, long, thickened and dystrophic with subungual debris, consistent with onychomycosis, 1-5 bilateral. No signs of infection noted.  Vascular: Palpable pedal pulses bilaterally. No edema or erythema noted. Capillary refill within normal limits.  Neurological: Epicritic and protective threshold diminished bilaterally.   Musculoskeletal Exam: Pain on palpation at the keratotic lesion noted. Clinical evidence of bunion deformity noted to the respective foot. There is a moderate pain on palpation range of motion of the first MPJ. Lateral deviation of the hallux noted consistent with hallux abductovalgus. Hammertoe contracture also noted on clinical exam to digits 2-5 of the bilateral feet. Symptomatic pain on palpation and range of motion also noted to the metatarsal phalangeal joints of the respective hammertoe digits.  Assessment: 1. Onychodystrophic nails 1-5 bilateral with hyperkeratosis of nails.  2. Onychomycosis of nail due to dermatophyte bilateral 3. Pre-ulcerative callus  lesions noted to the bilateral sub-fifth MPJs 4. HAV w/ bunion deformity bilateral 5. Hammertoe deformity digits 2-5 bilateral     Plan of Care:  1. Patient evaluated. 2. Excisional debridement of keratoic lesion using a chisel blade was performed without incident.  3. Dressed with light dressing. 4. Mechanical debridement of nails 1-5 bilaterally performed using a nail nipper. Filed with dremel without incident.  5. Appointment with Liliane Channel for diabetic shoes.  6. Patient is to return to the clinic in 3 months.   Edrick Kins, DPM [facility]  Dr. Edrick Kins, [address]                                        [place], [place] [address]                Office [phone]  Fax [phone]

## 2021-01-17 ENCOUNTER — Other Ambulatory Visit: Payer: Self-pay

## 2021-01-17 ENCOUNTER — Ambulatory Visit (INDEPENDENT_AMBULATORY_CARE_PROVIDER_SITE_OTHER): Payer: MEDICARE | Admitting: Podiatry

## 2021-01-17 DIAGNOSIS — I129 Hypertensive chronic kidney disease with stage 1 through stage 4 chronic kidney disease, or unspecified chronic kidney disease: Secondary | ICD-10-CM | POA: Insufficient documentation

## 2021-01-17 DIAGNOSIS — E785 Hyperlipidemia, unspecified: Secondary | ICD-10-CM | POA: Insufficient documentation

## 2021-01-17 DIAGNOSIS — E1165 Type 2 diabetes mellitus with hyperglycemia: Secondary | ICD-10-CM | POA: Insufficient documentation

## 2021-01-17 DIAGNOSIS — L989 Disorder of the skin and subcutaneous tissue, unspecified: Secondary | ICD-10-CM

## 2021-01-17 DIAGNOSIS — E0843 Diabetes mellitus due to underlying condition with diabetic autonomic (poly)neuropathy: Secondary | ICD-10-CM

## 2021-01-17 DIAGNOSIS — E1121 Type 2 diabetes mellitus with diabetic nephropathy: Secondary | ICD-10-CM | POA: Insufficient documentation

## 2021-01-17 DIAGNOSIS — I10 Essential (primary) hypertension: Secondary | ICD-10-CM | POA: Insufficient documentation

## 2021-01-17 DIAGNOSIS — I779 Disorder of arteries and arterioles, unspecified: Secondary | ICD-10-CM | POA: Insufficient documentation

## 2021-01-17 DIAGNOSIS — Z794 Long term (current) use of insulin: Secondary | ICD-10-CM | POA: Insufficient documentation

## 2021-01-17 DIAGNOSIS — E11319 Type 2 diabetes mellitus with unspecified diabetic retinopathy without macular edema: Secondary | ICD-10-CM | POA: Insufficient documentation

## 2021-01-17 DIAGNOSIS — E160 Drug-induced hypoglycemia without coma: Secondary | ICD-10-CM | POA: Insufficient documentation

## 2021-01-17 DIAGNOSIS — E782 Mixed hyperlipidemia: Secondary | ICD-10-CM | POA: Insufficient documentation

## 2021-01-17 DIAGNOSIS — I739 Peripheral vascular disease, unspecified: Secondary | ICD-10-CM | POA: Insufficient documentation

## 2021-01-17 DIAGNOSIS — Z0000 Encounter for general adult medical examination without abnormal findings: Secondary | ICD-10-CM | POA: Insufficient documentation

## 2021-01-17 NOTE — Progress Notes (Signed)
Patient presented for foam casting for 3 pair custom diabetic shoe inserts. Patient is measured with a Brannok device to be a size 9 1/2 D.  Diabetic shoes are chosen from the safe step catalog. The shoes chosen are [id number]  The patient will be contacted when the shoes and inserts are ready to be picked up.

## 2021-02-16 DIAGNOSIS — E782 Mixed hyperlipidemia: Secondary | ICD-10-CM | POA: Diagnosis not present

## 2021-02-16 DIAGNOSIS — E1165 Type 2 diabetes mellitus with hyperglycemia: Secondary | ICD-10-CM | POA: Diagnosis not present

## 2021-02-16 DIAGNOSIS — Z794 Long term (current) use of insulin: Secondary | ICD-10-CM | POA: Diagnosis not present

## 2021-02-16 DIAGNOSIS — I129 Hypertensive chronic kidney disease with stage 1 through stage 4 chronic kidney disease, or unspecified chronic kidney disease: Secondary | ICD-10-CM | POA: Diagnosis not present

## 2021-02-16 DIAGNOSIS — E1151 Type 2 diabetes mellitus with diabetic peripheral angiopathy without gangrene: Secondary | ICD-10-CM | POA: Diagnosis not present

## 2021-02-23 DIAGNOSIS — E1151 Type 2 diabetes mellitus with diabetic peripheral angiopathy without gangrene: Secondary | ICD-10-CM | POA: Diagnosis not present

## 2021-02-23 DIAGNOSIS — E1165 Type 2 diabetes mellitus with hyperglycemia: Secondary | ICD-10-CM | POA: Diagnosis not present

## 2021-02-23 DIAGNOSIS — I129 Hypertensive chronic kidney disease with stage 1 through stage 4 chronic kidney disease, or unspecified chronic kidney disease: Secondary | ICD-10-CM | POA: Diagnosis not present

## 2021-02-23 DIAGNOSIS — E782 Mixed hyperlipidemia: Secondary | ICD-10-CM | POA: Diagnosis not present

## 2021-02-23 DIAGNOSIS — I779 Disorder of arteries and arterioles, unspecified: Secondary | ICD-10-CM | POA: Diagnosis not present

## 2021-03-06 DIAGNOSIS — I739 Peripheral vascular disease, unspecified: Secondary | ICD-10-CM | POA: Diagnosis not present

## 2021-03-06 DIAGNOSIS — N184 Chronic kidney disease, stage 4 (severe): Secondary | ICD-10-CM | POA: Diagnosis not present

## 2021-03-06 DIAGNOSIS — R0989 Other specified symptoms and signs involving the circulatory and respiratory systems: Secondary | ICD-10-CM | POA: Diagnosis not present

## 2021-03-06 DIAGNOSIS — Z794 Long term (current) use of insulin: Secondary | ICD-10-CM | POA: Diagnosis not present

## 2021-03-06 DIAGNOSIS — Z0000 Encounter for general adult medical examination without abnormal findings: Secondary | ICD-10-CM | POA: Diagnosis not present

## 2021-03-06 DIAGNOSIS — I10 Essential (primary) hypertension: Secondary | ICD-10-CM | POA: Diagnosis not present

## 2021-03-06 DIAGNOSIS — E1165 Type 2 diabetes mellitus with hyperglycemia: Secondary | ICD-10-CM | POA: Diagnosis not present

## 2021-03-30 DIAGNOSIS — R0989 Other specified symptoms and signs involving the circulatory and respiratory systems: Secondary | ICD-10-CM | POA: Diagnosis not present

## 2021-03-30 DIAGNOSIS — I6523 Occlusion and stenosis of bilateral carotid arteries: Secondary | ICD-10-CM | POA: Diagnosis not present

## 2021-03-30 DIAGNOSIS — I10 Essential (primary) hypertension: Secondary | ICD-10-CM | POA: Diagnosis not present

## 2021-04-05 DIAGNOSIS — I6521 Occlusion and stenosis of right carotid artery: Secondary | ICD-10-CM | POA: Diagnosis not present

## 2021-04-19 DIAGNOSIS — E1151 Type 2 diabetes mellitus with diabetic peripheral angiopathy without gangrene: Secondary | ICD-10-CM | POA: Diagnosis not present

## 2021-04-19 DIAGNOSIS — E11319 Type 2 diabetes mellitus with unspecified diabetic retinopathy without macular edema: Secondary | ICD-10-CM | POA: Diagnosis not present

## 2021-04-19 DIAGNOSIS — E1121 Type 2 diabetes mellitus with diabetic nephropathy: Secondary | ICD-10-CM | POA: Diagnosis not present

## 2021-04-19 DIAGNOSIS — H547 Unspecified visual loss: Secondary | ICD-10-CM | POA: Diagnosis not present

## 2021-04-19 DIAGNOSIS — E1139 Type 2 diabetes mellitus with other diabetic ophthalmic complication: Secondary | ICD-10-CM | POA: Diagnosis not present

## 2021-04-19 DIAGNOSIS — I129 Hypertensive chronic kidney disease with stage 1 through stage 4 chronic kidney disease, or unspecified chronic kidney disease: Secondary | ICD-10-CM | POA: Diagnosis not present

## 2021-04-24 DIAGNOSIS — E11319 Type 2 diabetes mellitus with unspecified diabetic retinopathy without macular edema: Secondary | ICD-10-CM | POA: Diagnosis not present

## 2021-04-24 DIAGNOSIS — E875 Hyperkalemia: Secondary | ICD-10-CM | POA: Diagnosis not present

## 2021-04-24 DIAGNOSIS — I129 Hypertensive chronic kidney disease with stage 1 through stage 4 chronic kidney disease, or unspecified chronic kidney disease: Secondary | ICD-10-CM | POA: Diagnosis not present

## 2021-04-24 DIAGNOSIS — D631 Anemia in chronic kidney disease: Secondary | ICD-10-CM | POA: Diagnosis not present

## 2021-04-24 DIAGNOSIS — N2581 Secondary hyperparathyroidism of renal origin: Secondary | ICD-10-CM | POA: Diagnosis not present

## 2021-04-24 DIAGNOSIS — E872 Acidosis: Secondary | ICD-10-CM | POA: Diagnosis not present

## 2021-04-24 DIAGNOSIS — N184 Chronic kidney disease, stage 4 (severe): Secondary | ICD-10-CM | POA: Diagnosis not present

## 2021-05-04 ENCOUNTER — Other Ambulatory Visit: Payer: Self-pay

## 2021-05-04 ENCOUNTER — Encounter: Payer: Self-pay | Admitting: Vascular Surgery

## 2021-05-04 ENCOUNTER — Ambulatory Visit (INDEPENDENT_AMBULATORY_CARE_PROVIDER_SITE_OTHER): Payer: BC Managed Care – PPO | Admitting: Vascular Surgery

## 2021-05-04 VITALS — BP 186/74 | HR 58 | Temp 98.2°F | Resp 20 | Ht 70.0 in | Wt 171.0 lb

## 2021-05-04 DIAGNOSIS — I739 Peripheral vascular disease, unspecified: Secondary | ICD-10-CM | POA: Diagnosis not present

## 2021-05-04 DIAGNOSIS — I6521 Occlusion and stenosis of right carotid artery: Secondary | ICD-10-CM | POA: Diagnosis not present

## 2021-05-04 NOTE — Progress Notes (Signed)
Referring Physician: Dr. Merrilee Seashore  Patient name: Jonathan Baird MRN: [id number] DOB: 05/28/1933 Sex: male  REASON FOR CONSULT: Asymptomatic right carotid stenosis  HPI: Jonathan Baird is a 87 y.o. male, who was noted to have right carotid stenosis after ultrasound screening for carotid bruit.  Patient has no symptoms of TIA amaurosis or stroke.  He states he did have a TIA about 10 years ago while he was on chemotherapy.  This was a brief period of confusion.  It was nonfocal.  He currently is on Xarelto.  He did not know exactly why he was on this medication.  He is also on a statin.  Other medical problems include diabetes and hypertension both of which are controlled.  He also had bilateral ABIs performed recently which were 0.67 on the right 0.62 on the left.  He has no symptoms of claudication or rest pain or nonhealing wounds.  All of these vascular studies were done May 2022 at [facility] radiology.  I reviewed the studies today.  Past Medical History:  Diagnosis Date   Carotid artery occlusion    Colon cancer ([place]) 10/12/2004   stage III   Diabetes mellitus    Past Surgical History:  Procedure Laterality Date   hemocolectomy  2005    Family History  Problem Relation Age of Onset   Cancer Mother    Diabetes Father    Cancer Sister     SOCIAL HISTORY: Social History   Socioeconomic History   Marital status: Married    Spouse name: Not on file   Number of children: Not on file   Years of education: Not on file   Highest education level: Not on file  Occupational History   Not on file  Tobacco Use   Smoking status: Never   Smokeless tobacco: Never  Vaping Use   Vaping Use: Never used  Substance and Sexual Activity   Alcohol use: No   Drug use: No   Sexual activity: Yes  Other Topics Concern   Not on file  Social History Narrative   Not on file   Social Determinants of Health   Financial Resource Strain: Not on file  Food Insecurity: Not on  file  Transportation Needs: Not on file  Physical Activity: Not on file  Stress: Not on file  Social Connections: Not on file  Intimate Partner Violence: Not on file    No Known Allergies  Current Outpatient Medications  Medication Sig Dispense Refill   amLODipine (NORVASC) 10 MG tablet Take 10 mg by mouth daily.  3   amLODipine (NORVASC) 2.5 MG tablet Take 2.5 mg by mouth daily.     B-D ULTRAFINE III SHORT PEN 31G X 8 MM MISC Inject into the skin 2 (two) times daily.     calcitRIOL (ROCALTROL) 0.25 MCG capsule      Continuous Blood Gluc Sensor (FREESTYLE LIBRE 2 SENSOR) MISC See admin instructions.     Dulaglutide (TRULICITY) 1.5 XV/4.0GQ SOPN 1.5 mg     glipiZIDE (GLUCOTROL) 5 MG tablet Take 5 mg by mouth 2 (two) times daily before a meal. 5mg  BID 2.5 once daily     hydrochlorothiazide (MICROZIDE) 12.5 MG capsule Take 2.5 mg by mouth daily.     insulin aspart (NOVOLOG FLEXPEN) 100 UNIT/ML FlexPen 3 units     Insulin Glargine (BASAGLAR KWIKPEN) 100 UNIT/ML 6 units     JANUVIA 25 MG tablet      lisinopril (PRINIVIL,ZESTRIL) 40 MG tablet Take  40 mg by mouth daily.     pravastatin (PRAVACHOL) 80 MG tablet Take 70 mg by mouth daily.     saxagliptin HCl (ONGLYZA) 5 MG TABS tablet Take by mouth daily. Cut 5mg  in half     sodium bicarbonate 650 MG tablet Take 650 mg by mouth 3 (three) times daily.  5   XARELTO 2.5 MG TABS tablet TAKE 1 TABLET BY MOUTH TWICE A DAY. TAKE WITH ASPIRIN 81MG  DAILY.  3   No current facility-administered medications for this visit.    ROS:   General:  No weight loss, Fever, chills  HEENT: No recent headaches, no nasal bleeding, no visual changes, no sore throat  Neurologic: No dizziness, blackouts, seizures. No recent symptoms of stroke or mini- stroke. No recent episodes of slurred speech, or temporary blindness.  Cardiac: No recent episodes of chest pain/pressure, no shortness of breath at rest.  No shortness of breath with exertion.  Denies history of  atrial fibrillation or irregular heartbeat  Vascular: No history of rest pain in feet.  No history of claudication.  No history of non-healing ulcer, No history of DVT   Pulmonary: No home oxygen, no productive cough, no hemoptysis,  No asthma or wheezing  Musculoskeletal:  [ ]  Arthritis, [ ]  Low back pain,  [ ]  Joint pain  Hematologic:No history of hypercoagulable state.  No history of easy bleeding.  No history of anemia  Gastrointestinal: No hematochezia or melena,  No gastroesophageal reflux, no trouble swallowing  Urinary: [ ]  chronic Kidney disease, [ ]  on HD - [ ]  MWF or [ ]  TTHS, [ ]  Burning with urination, [ ]  Frequent urination, [ ]  Difficulty urinating;   Skin: No rashes  Psychological: No history of anxiety,  No history of depression   Physical Examination  Vitals:   05/04/21 1256 05/04/21 1258  BP: (!) 173/73 (!) 186/74  Pulse: (!) 58   Resp: 20   Temp: 98.2 F (36.8 C)   SpO2: 96%   Weight: 171 lb (77.6 kg)   Height: 5\' 10"  (1.778 m)     Body mass index is 24.54 kg/m.  General:  Alert and oriented, no acute distress HEENT: Normal Neck: No JVD Cardiac: Regular Rate and Rhythm  Abdomen: Soft, non-tender, non-distended, no mass Skin: No rash Extremity Pulses:  2+ radial, brachial, femoral, absent dorsalis pedis, posterior tibial pulses bilaterally Musculoskeletal: No deformity or edema  Neurologic: Upper and lower extremity motor 5/5 and symmetric  DATA:  I reviewed the velocities of the patient's recent carotid duplex scan which were peak systolic velocity on the right side up to 41 cm/s.  There was no end-diastolic velocity reported.  Disease primarily was at the bulb with 70% stenosis by IC to CC criteria.  ABIs were 0.67 on the right 0.62 on the left  ASSESSMENT: 1.  Peripheral arterial disease currently asymptomatic reasonable perfusion to both lower extremities no intervention necessary patient encouraged to walk 30 minutes daily.  2.  70%  asymptomatic right internal carotid artery stenosis.  However this was based on peak systolic velocity and ICA/CCA criteria.  Since patient is asymptomatic and less than 80% we will continue to monitor this for now.  We will repeat carotid duplex scan and be seen in APP clinic in 6 months.  He is on a statin and Xarelto.   PLAN: See above   Ruta Hinds, MD [facility] Office: [phone]

## 2021-05-05 ENCOUNTER — Other Ambulatory Visit: Payer: Self-pay

## 2021-05-05 DIAGNOSIS — I6521 Occlusion and stenosis of right carotid artery: Secondary | ICD-10-CM

## 2021-05-11 ENCOUNTER — Encounter (INDEPENDENT_AMBULATORY_CARE_PROVIDER_SITE_OTHER): Payer: Medicare Other | Admitting: Ophthalmology

## 2021-05-31 ENCOUNTER — Ambulatory Visit (INDEPENDENT_AMBULATORY_CARE_PROVIDER_SITE_OTHER): Payer: MEDICARE | Admitting: Podiatry

## 2021-05-31 ENCOUNTER — Other Ambulatory Visit: Payer: Self-pay

## 2021-05-31 DIAGNOSIS — E1140 Type 2 diabetes mellitus with diabetic neuropathy, unspecified: Secondary | ICD-10-CM | POA: Diagnosis not present

## 2021-05-31 DIAGNOSIS — L989 Disorder of the skin and subcutaneous tissue, unspecified: Secondary | ICD-10-CM

## 2021-05-31 DIAGNOSIS — L84 Corns and callosities: Secondary | ICD-10-CM | POA: Diagnosis not present

## 2021-05-31 DIAGNOSIS — E0843 Diabetes mellitus due to underlying condition with diabetic autonomic (poly)neuropathy: Secondary | ICD-10-CM

## 2021-05-31 DIAGNOSIS — M2041 Other hammer toe(s) (acquired), right foot: Secondary | ICD-10-CM

## 2021-05-31 DIAGNOSIS — M2042 Other hammer toe(s) (acquired), left foot: Secondary | ICD-10-CM | POA: Diagnosis not present

## 2021-05-31 NOTE — Progress Notes (Signed)
The patient presented to the office today to pick up diabetic shoes and 3 diabetic custom inserts.  1 pair of inserts were put in the shoes and the shoes were fitted to the patient. The patient states they are comfortable and free of defect. He was satisfied with the fit of the shoe. Instructions for break in and wear were dispensed. The patient signed the delivery documentation and break in instructions form  If any concerns or questions arise, he is instructed to call

## 2021-06-26 ENCOUNTER — Ambulatory Visit (INDEPENDENT_AMBULATORY_CARE_PROVIDER_SITE_OTHER): Payer: MEDICARE | Admitting: Ophthalmology

## 2021-06-26 ENCOUNTER — Encounter (INDEPENDENT_AMBULATORY_CARE_PROVIDER_SITE_OTHER): Payer: Self-pay | Admitting: Ophthalmology

## 2021-06-26 ENCOUNTER — Other Ambulatory Visit: Payer: Self-pay

## 2021-06-26 DIAGNOSIS — E113553 Type 2 diabetes mellitus with stable proliferative diabetic retinopathy, bilateral: Secondary | ICD-10-CM

## 2021-06-26 DIAGNOSIS — H353134 Nonexudative age-related macular degeneration, bilateral, advanced atrophic with subfoveal involvement: Secondary | ICD-10-CM

## 2021-06-26 DIAGNOSIS — Z794 Long term (current) use of insulin: Secondary | ICD-10-CM

## 2021-06-26 NOTE — Assessment & Plan Note (Signed)

## 2021-06-26 NOTE — Assessment & Plan Note (Signed)

## 2021-06-26 NOTE — Progress Notes (Signed)
06/26/2021     CHIEF COMPLAINT Patient presents for Retina Follow Up   HISTORY OF PRESENT ILLNESS: Jonathan Baird is a 87 y.o. male who presents to the clinic today for:   HPI     Retina Follow Up           Diagnosis: Diabetic Retinopathy   Laterality: both eyes   Onset: 10 months ago   Severity: mild   Duration: 10 weeks   Course: gradually worsening         Comments   10 month fu OU and OCT/FP Pt states, "My vision seems to be more cloudy than it was before." A1C: 7.9 LBS: 132      Last edited by Kendra Opitz, COA on 06/26/2021  2:05 PM.      Referring physician: Merrilee Seashore, [address],  [place] [address]  HISTORICAL INFORMATION:   Selected notes from the [facility]    Lab Results  Component Value Date   HGBA1C 6.8 (H) 05/13/2006     CURRENT MEDICATIONS: No current outpatient medications on file. (Ophthalmic Drugs)   No current facility-administered medications for this visit. (Ophthalmic Drugs)   Current Outpatient Medications (Other)  Medication Sig   amLODipine (NORVASC) 10 MG tablet Take 10 mg by mouth daily.   amLODipine (NORVASC) 2.5 MG tablet Take 2.5 mg by mouth daily.   B-D ULTRAFINE III SHORT PEN 31G X 8 MM MISC Inject into the skin 2 (two) times daily.   calcitRIOL (ROCALTROL) 0.25 MCG capsule    Continuous Blood Gluc Sensor (FREESTYLE LIBRE 2 SENSOR) MISC See admin instructions.   Dulaglutide (TRULICITY) 1.5 EX/5.2WU SOPN 1.5 mg   glipiZIDE (GLUCOTROL) 5 MG tablet Take 5 mg by mouth 2 (two) times daily before a meal. 5mg  BID 2.5 once daily   hydrochlorothiazide (MICROZIDE) 12.5 MG capsule Take 2.5 mg by mouth daily.   insulin aspart (NOVOLOG FLEXPEN) 100 UNIT/ML FlexPen 3 units   Insulin Glargine (BASAGLAR KWIKPEN) 100 UNIT/ML 6 units   JANUVIA 25 MG tablet    lisinopril (PRINIVIL,ZESTRIL) 40 MG tablet Take 40 mg by mouth daily.   pravastatin (PRAVACHOL) 80 MG tablet Take 70 mg by  mouth daily.   saxagliptin HCl (ONGLYZA) 5 MG TABS tablet Take by mouth daily. Cut 5mg  in half   sodium bicarbonate 650 MG tablet Take 650 mg by mouth 3 (three) times daily.   XARELTO 2.5 MG TABS tablet TAKE 1 TABLET BY MOUTH TWICE A DAY. TAKE WITH ASPIRIN 81MG  DAILY.   No current facility-administered medications for this visit. (Other)      REVIEW OF SYSTEMS:    ALLERGIES No Known Allergies  PAST MEDICAL HISTORY Past Medical History:  Diagnosis Date   Carotid artery occlusion    Colon cancer ([place]) 10/12/2004   stage III   Diabetes mellitus    Past Surgical History:  Procedure Laterality Date   hemocolectomy  2005    FAMILY HISTORY Family History  Problem Relation Age of Onset   Cancer Mother    Diabetes Father    Cancer Sister     SOCIAL HISTORY Social History   Tobacco Use   Smoking status: Never   Smokeless tobacco: Never  Vaping Use   Vaping Use: Never used  Substance Use Topics   Alcohol use: No   Drug use: No         OPHTHALMIC EXAM:  Base Eye Exam     Visual Acuity (ETDRS)  Right Left   Dist [place] 20/200 20/100 -1   Dist ph [place] NI NI         Tonometry (Tonopen, 2:08 PM)       Right Left   Pressure 14 16         Pupils       Pupils Dark Light Shape React APD   Right PERRL 2 2 Round Minimal None   Left PERRL 2 2 Round Minimal None         Visual Fields (Counting fingers)       Left Right    Full Full         Extraocular Movement       Right Left    Full Full         Neuro/Psych     Oriented x3: Yes   Mood/Affect: Normal         Dilation     Both eyes: 1.0% Mydriacyl, 2.5% Phenylephrine @ 2:08 PM           Slit Lamp and Fundus Exam     External Exam       Right Left   External Normal Normal         Slit Lamp Exam       Right Left   Lids/Lashes Normal Normal   Conjunctiva/Sclera White and quiet White and quiet   Cornea Clear Clear   Anterior Chamber Deep and quiet Deep and quiet    Iris Round and reactive Round and reactive   Anterior Vitreous Normal Normal         Fundus Exam       Right Left   Posterior Vitreous Clear, vitrectomized Clear, vitrectomized   Disc Normal Normal   C/D Ratio 0.4 0.4   Macula Good focal laser photocoagulation, geographic atrophy encroaching upon the FAZ, some lesion creep into the fovea Good focal laser photocoagulation, geographic atrophy encroaching upon the FAZ, some lesion creep into the fovea   Vessels PDR-quiet PDR-quiet   Periphery PRP 360, quiescent NVE PRP 360, quiescent NVE            IMAGING AND PROCEDURES  Imaging and Procedures for 06/26/21  OCT, Retina - OU - Both Eyes       Right Eye Quality was good. Scan locations included subfoveal. Central Foveal Thickness: 196. Progression has been stable. Findings include abnormal foveal contour.   Left Eye Quality was good. Scan locations included subfoveal. Progression has been stable. Findings include abnormal foveal contour.   Notes Geographic atrophy centrally, counts are acuity no active disease no active CSME     Color Fundus Photography Optos - OU - Both Eyes       Right Eye Progression has been stable. Disc findings include increased cup to disc ratio, pallor. Macula : retinal pigment epithelium abnormalities, geographic atrophy.   Left Eye Progression has been stable. Macula : retinal pigment epithelium abnormalities, geographic atrophy.   Notes OD, good PRP, good focal laser treatment temporal to the macula with lesion creep, also some areas of geographic atrophy from dry ARMD.  No active neovascularization  OS with somewhat hazy media however this is not confirmed on clinical examination.,  Good PRP, lesion creep and geographic atrophy may explain visual acuity change noted symptomatically by the patient.  PDR remains quiet in the media overall clear             ASSESSMENT/PLAN:  Controlled diabetes mellitus with stable proliferative  retinopathy of both  eyes ([place]) The nature of regressed proliferative diabetic retinopathy was discussed with the patient. The patient was advised to maintain good glucose, blood pressure, monitor kidney function and serum lipid control as advised by personal physician. Rare risk for reactivation of progression exist with untreated severe anemia, untreated renal failure, untreated heart failure, and smoking. Complete avoidance of smoking was recommended. The chance of recurrent proliferative diabetic retinopathy was discussed as well as the chance of vitreous hemorrhage for which further treatments may be necessary.   Explained to the patient that the quiescent  proliferative diabetic retinopathy disease is unlikely to ever worsen.  Worsening factors would include however severe anemia, hypertension out-of-control or impending renal failure.  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 degeneration of both eyes with subfoveal involvement The nature of dry age related macular degeneration was discussed with the patient as well as its possible conversion to wet. The results of the AREDS 2 study was discussed with the patient. A diet rich in dark leafy green vegetables was advised and specific recommendations were made regarding supplements with AREDS 2 formulation . Control of hypertension and serum cholesterol may slow the disease. Smoking cessation is mandatory to slow the disease and diminish the risk of progressing to wet age related macular degeneration. The patient was instructed in the use of an [place] and was told to return immediately for any changes in the Grid. Stressed to the patient do not rub eyes     ICD-10-CM   1. Controlled type 2 diabetes mellitus with stable proliferative retinopathy of both eyes, with long-term current use of insulin (HCC)  E11.3553 OCT, Retina - OU - Both Eyes   Z79.4 Color Fundus Photography Optos - OU - Both Eyes    2. 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  degeneration of both eyes with subfoveal involvement  H35.3134       1.  Quiescent PDR OU, will observe  2.  Dry ARMD accounts for acuity of geographic atrophy centrally continues to spread  3.  Ophthalmic Meds Ordered this visit:  No orders of the defined types were placed in this encounter.      Return in about 10 months (around 04/26/2022) for COLOR FP, DILATE OU, OCT.  There are no Patient Instructions on file for this visit.   Explained the diagnoses, plan, and follow up with the patient and they expressed understanding.  Patient expressed understanding of the importance of proper follow up care.   Clent Demark Fleta Borgeson M.D. Diseases & Surgery of the Retina and Vitreous [facility]ina [facility] Diabetic [place] 06/26/21     Abbreviations: M myopia (nearsighted); A astigmatism; H hyperopia (farsighted); P presbyopia; Mrx spectacle prescription;  CTL contact lenses; OD right eye; OS left eye; OU both eyes  XT exotropia; ET esotropia; PEK punctate epithelial keratitis; PEE punctate epithelial erosions; DES dry eye syndrome; MGD meibomian gland dysfunction; ATs artificial tears; PFAT's preservative free artificial tears; [place] nuclear sclerotic cataract; PSC posterior subcapsular cataract; ERM epi-retinal membrane; PVD posterior vitreous detachment; RD retinal detachment; DM diabetes mellitus; DR diabetic retinopathy; NPDR non-proliferative diabetic retinopathy; PDR proliferative diabetic retinopathy; CSME clinically significant macular edema; DME diabetic macular edema; dbh dot blot hemorrhages; CWS cotton wool spot; POAG primary open angle glaucoma; C/D cup-to-disc ratio; HVF humphrey visual field; GVF goldmann visual field; OCT optical coherence tomography; IOP intraocular pressure; BRVO Branch retinal vein occlusion; CRVO central retinal vein occlusion; CRAO central retinal artery occlusion; BRAO branch retinal artery occlusion; RT retinal tear; SB scleral buckle; PPV pars plana vitrectomy; VH  Vitreous  hemorrhage; PRP panretinal laser photocoagulation; IVK intravitreal kenalog; VMT vitreomacular traction; MH Macular hole;  NVD neovascularization of the disc; NVE neovascularization elsewhere; AREDS age related eye disease study; ARMD age related macular degeneration; POAG primary open angle glaucoma; EBMD epithelial/anterior basement membrane dystrophy; ACIOL anterior chamber intraocular lens; IOL intraocular lens; PCIOL posterior chamber intraocular lens; Phaco/IOL phacoemulsification with intraocular lens placement; [place] photorefractive keratectomy; LASIK laser assisted in situ keratomileusis; HTN hypertension; DM diabetes mellitus; COPD chronic obstructive pulmonary disease

## 2021-08-07 DIAGNOSIS — R42 Dizziness and giddiness: Secondary | ICD-10-CM | POA: Diagnosis not present

## 2021-08-07 DIAGNOSIS — I779 Disorder of arteries and arterioles, unspecified: Secondary | ICD-10-CM | POA: Diagnosis not present

## 2021-08-07 DIAGNOSIS — I129 Hypertensive chronic kidney disease with stage 1 through stage 4 chronic kidney disease, or unspecified chronic kidney disease: Secondary | ICD-10-CM | POA: Diagnosis not present

## 2021-08-28 DIAGNOSIS — E782 Mixed hyperlipidemia: Secondary | ICD-10-CM | POA: Diagnosis not present

## 2021-09-04 DIAGNOSIS — E1151 Type 2 diabetes mellitus with diabetic peripheral angiopathy without gangrene: Secondary | ICD-10-CM | POA: Diagnosis not present

## 2021-09-04 DIAGNOSIS — N184 Chronic kidney disease, stage 4 (severe): Secondary | ICD-10-CM | POA: Diagnosis not present

## 2021-09-04 DIAGNOSIS — Z23 Encounter for immunization: Secondary | ICD-10-CM | POA: Diagnosis not present

## 2021-09-04 DIAGNOSIS — E782 Mixed hyperlipidemia: Secondary | ICD-10-CM | POA: Diagnosis not present

## 2021-09-04 DIAGNOSIS — I129 Hypertensive chronic kidney disease with stage 1 through stage 4 chronic kidney disease, or unspecified chronic kidney disease: Secondary | ICD-10-CM | POA: Diagnosis not present

## 2021-09-04 DIAGNOSIS — I779 Disorder of arteries and arterioles, unspecified: Secondary | ICD-10-CM | POA: Diagnosis not present

## 2021-09-04 DIAGNOSIS — E1165 Type 2 diabetes mellitus with hyperglycemia: Secondary | ICD-10-CM | POA: Diagnosis not present

## 2021-09-25 ENCOUNTER — Ambulatory Visit: Payer: MEDICARE | Admitting: Cardiology

## 2021-09-25 ENCOUNTER — Ambulatory Visit: Payer: Self-pay | Admitting: Cardiology

## 2021-09-25 DIAGNOSIS — I6529 Occlusion and stenosis of unspecified carotid artery: Secondary | ICD-10-CM | POA: Insufficient documentation

## 2021-09-25 DIAGNOSIS — N1832 Chronic kidney disease, stage 3b: Secondary | ICD-10-CM | POA: Insufficient documentation

## 2021-09-25 NOTE — Progress Notes (Deleted)
    Patient referred by Merrilee Seashore, MD for ***  Subjective:   Jonathan Baird, male    DOB: [birth year], 87 y.o.   MRN: [id number]  *** No chief complaint on file.   *** HPI  87 y.o. *** male with ***  *** Past Medical History:  Diagnosis Date   Carotid artery occlusion    Colon cancer ([place]) 10/12/2004   stage III   Diabetes mellitus     *** Past Surgical History:  Procedure Laterality Date   hemocolectomy  2005    *** Social History   Tobacco Use  Smoking Status Never  Smokeless Tobacco Never    Social History   Substance and Sexual Activity  Alcohol Use No    *** Family History  Problem Relation Age of Onset   Cancer Mother    Diabetes Father    Cancer Sister     *** Current Outpatient Medications on File Prior to Visit  Medication Sig Dispense Refill   amLODipine (NORVASC) 10 MG tablet Take 10 mg by mouth daily.  3   amLODipine (NORVASC) 2.5 MG tablet Take 2.5 mg by mouth daily.     B-D ULTRAFINE III SHORT PEN 31G X 8 MM MISC Inject into the skin 2 (two) times daily.     calcitRIOL (ROCALTROL) 0.25 MCG capsule      Continuous Blood Gluc Sensor (FREESTYLE LIBRE 2 SENSOR) MISC See admin instructions.     Dulaglutide (TRULICITY) 1.5 CH/8.8FO SOPN 1.5 mg     glipiZIDE (GLUCOTROL) 5 MG tablet Take 5 mg by mouth 2 (two) times daily before a meal. $Remov'5mg'OroelC$  BID 2.5 once daily     hydrochlorothiazide (MICROZIDE) 12.5 MG capsule Take 2.5 mg by mouth daily.     insulin aspart (NOVOLOG FLEXPEN) 100 UNIT/ML FlexPen 3 units     Insulin Glargine (BASAGLAR KWIKPEN) 100 UNIT/ML 6 units     JANUVIA 25 MG tablet      lisinopril (PRINIVIL,ZESTRIL) 40 MG tablet Take 40 mg by mouth daily.     pravastatin (PRAVACHOL) 80 MG tablet Take 70 mg by mouth daily.     saxagliptin HCl (ONGLYZA) 5 MG TABS tablet Take by mouth daily. Cut $RemoveBe'5mg'duCIDnJJE$  in half     sodium bicarbonate 650 MG tablet Take 650 mg by mouth 3 (three) times daily.  5   XARELTO 2.5 MG TABS tablet TAKE 1  TABLET BY MOUTH TWICE A DAY. TAKE WITH ASPIRIN $RemoveBefo'81MG'JwzLLFXsWqf$  DAILY.  3   No current facility-administered medications on file prior to visit.    Cardiovascular and other pertinent studies:  *** EKG ***/***/202***: ***  No results found for this or any previous visit from the past 1095 days.    *** Recent labs: ***/***/202***: Glucose ***, BUN/Cr ***/***. EGFR ***. Na/K ***/***. ***Rest of the CMP normal H/H 32.7/11.2. MCV 77.9. Platelets 253 ***HbA1C ***% Chol ***, TG ***, HDL ***, LDL *** ***TSH ***normal   *** ROS      *** There were no vitals filed for this visit.   There is no height or weight on file to calculate BMI. There were no vitals filed for this visit.  *** Objective:   Physical Exam    ***     Assessment & Recommendations:   ***  ***  Thank you for referring the patient to [place]. Please feel free to contact with any questions.   Nigel Mormon, MD Pager: [phone] Office: [phone]

## 2021-09-25 NOTE — Progress Notes (Signed)
Error

## 2021-09-26 DIAGNOSIS — Z794 Long term (current) use of insulin: Secondary | ICD-10-CM | POA: Diagnosis not present

## 2021-09-26 DIAGNOSIS — I129 Hypertensive chronic kidney disease with stage 1 through stage 4 chronic kidney disease, or unspecified chronic kidney disease: Secondary | ICD-10-CM | POA: Diagnosis not present

## 2021-09-26 DIAGNOSIS — E1165 Type 2 diabetes mellitus with hyperglycemia: Secondary | ICD-10-CM | POA: Diagnosis not present

## 2021-10-12 ENCOUNTER — Ambulatory Visit: Payer: MEDICARE | Admitting: Cardiology

## 2021-11-02 ENCOUNTER — Ambulatory Visit (HOSPITAL_COMMUNITY)
Admission: RE | Admit: 2021-11-02 | Discharge: 2021-11-02 | Disposition: A | Discharge status: Home / Self Care | Payer: MEDICARE | Source: Ambulatory Visit | Attending: Vascular Surgery | Admitting: Vascular Surgery

## 2021-11-02 ENCOUNTER — Ambulatory Visit (INDEPENDENT_AMBULATORY_CARE_PROVIDER_SITE_OTHER): Payer: MEDICARE | Admitting: Physician Assistant

## 2021-11-02 ENCOUNTER — Other Ambulatory Visit: Payer: Self-pay

## 2021-11-02 VITALS — BP 168/67 | HR 83 | Temp 98.7°F | Resp 16 | Ht 71.0 in | Wt 168.5 lb

## 2021-11-02 DIAGNOSIS — I6523 Occlusion and stenosis of bilateral carotid arteries: Secondary | ICD-10-CM | POA: Diagnosis not present

## 2021-11-02 DIAGNOSIS — I6521 Occlusion and stenosis of right carotid artery: Secondary | ICD-10-CM | POA: Insufficient documentation

## 2021-11-02 DIAGNOSIS — I739 Peripheral vascular disease, unspecified: Secondary | ICD-10-CM | POA: Diagnosis not present

## 2021-11-02 NOTE — Progress Notes (Signed)
Office Note     CC:  follow up Requesting Provider:  Merrilee Seashore, MD  HPI: Jonathan Baird is a 87 y.o. (1933-04-25) male who presents for surveillance follow up of carotid stenosis. He is known to have right ICA stenosis of 70%. He has no history of stroke. He has had TIA in the past > 10 years ago. He denies any amaurosis fugax or other visual changes, slurred speech, facial drooping, unilateral upper or lower extremity weakness or numbness. He denies any claudication symptoms, rest pain or tissue loss. He does get some swelling in his bilateral ankles. He also reports numbness in both of his feet  The pt is on a statin for cholesterol management.  The pt is on a daily aspirin.   Other AC:  Xarelto 2.5 mg The pt is on ACE, CCB, HCTZ for hypertension.   The pt is diabetic.   Tobacco hx:  never  Past Medical History:  Diagnosis Date   Carotid artery occlusion    Colon cancer ([place]) 10/12/2004   stage III   Diabetes mellitus     Past Surgical History:  Procedure Laterality Date   hemocolectomy  2005    Social History   Socioeconomic History   Marital status: Married    Spouse name: Not on file   Number of children: Not on file   Years of education: Not on file   Highest education level: Not on file  Occupational History   Not on file  Tobacco Use   Smoking status: Never   Smokeless tobacco: Never  Vaping Use   Vaping Use: Never used  Substance and Sexual Activity   Alcohol use: No   Drug use: No   Sexual activity: Yes  Other Topics Concern   Not on file  Social History Narrative   Not on file   Social Determinants of Health   Financial Resource Strain: Not on file  Food Insecurity: Not on file  Transportation Needs: Not on file  Physical Activity: Not on file  Stress: Not on file  Social Connections: Not on file  Intimate Partner Violence: Not on file    Family History  Problem Relation Age of Onset   Cancer Mother    Diabetes Father     Cancer Sister     Current Outpatient Medications  Medication Sig Dispense Refill   amLODipine (NORVASC) 10 MG tablet Take 10 mg by mouth daily.  3   amLODipine (NORVASC) 2.5 MG tablet Take 2.5 mg by mouth daily.     B-D ULTRAFINE III SHORT PEN 31G X 8 MM MISC Inject into the skin 2 (two) times daily.     calcitRIOL (ROCALTROL) 0.25 MCG capsule      Continuous Blood Gluc Sensor (FREESTYLE LIBRE 2 SENSOR) MISC See admin instructions.     Dulaglutide (TRULICITY) 1.5 BJ/6.2GB SOPN 1.5 mg     glipiZIDE (GLUCOTROL) 5 MG tablet Take 5 mg by mouth 2 (two) times daily before a meal. 5mg  BID 2.5 once daily     hydrochlorothiazide (MICROZIDE) 12.5 MG capsule Take 2.5 mg by mouth daily.     insulin aspart (NOVOLOG FLEXPEN) 100 UNIT/ML FlexPen 3 units     Insulin Glargine (BASAGLAR KWIKPEN) 100 UNIT/ML 6 units     JANUVIA 25 MG tablet      lisinopril (PRINIVIL,ZESTRIL) 40 MG tablet Take 40 mg by mouth daily.     pravastatin (PRAVACHOL) 80 MG tablet Take 70 mg by mouth daily.  saxagliptin HCl (ONGLYZA) 5 MG TABS tablet Take by mouth daily. Cut 5mg  in half     sodium bicarbonate 650 MG tablet Take 650 mg by mouth 3 (three) times daily.  5   XARELTO 2.5 MG TABS tablet TAKE 1 TABLET BY MOUTH TWICE A DAY. TAKE WITH ASPIRIN 81MG  DAILY.  3   No current facility-administered medications for this visit.    No Known Allergies   REVIEW OF SYSTEMS:  [X]  denotes positive finding, [ ]  denotes negative finding Cardiac  Comments:  Chest pain or chest pressure:    Shortness of breath upon exertion:    Short of breath when lying flat:    Irregular heart rhythm:        Vascular    Pain in calf, thigh, or hip brought on by ambulation:    Pain in feet at night that wakes you up from your sleep:     Blood clot in your veins:    Leg swelling:         Pulmonary    Oxygen at home:    Productive cough:     Wheezing:         Neurologic    Sudden weakness in arms or legs:     Sudden numbness in arms or  legs:     Sudden onset of difficulty speaking or slurred speech:    Temporary loss of vision in one eye:     Problems with dizziness:         Gastrointestinal    Blood in stool:     Vomited blood:         Genitourinary    Burning when urinating:     Blood in urine:        Psychiatric    Major depression:         Hematologic    Bleeding problems:    Problems with blood clotting too easily:        Skin    Rashes or ulcers:        Constitutional    Fever or chills:      PHYSICAL EXAMINATION:  Vitals:   11/02/21 1335  BP: (!) 168/67  Pulse: 83  Resp: 16  Temp: 98.7 F (37.1 C)  TempSrc: Temporal  SpO2: 99%  Weight: 168 lb 8 oz (76.4 kg)  Height: 5\' 11"  (1.803 m)    General:  WDWN in NAD; vital signs documented above Gait: Normal HENT: WNL, normocephalic Pulmonary: normal non-labored breathing , without wheezing Cardiac: regular HR, without  Murmurs with right carotid bruit Abdomen: soft, NT, no masses Vascular Exam/Pulses:  Right Left  Radial 2+ (normal) 2+ (normal)  Femoral 2+ (normal) 2+ (normal)  Popliteal Not palpable Not palpable  DP 2+ (normal) 2+ (normal)  PT 1+ (weak) absent   Extremities: without ischemic changes, without Gangrene , without cellulitis; without open wounds;  Musculoskeletal: no muscle wasting or atrophy  Neurologic: A&O X 3;  No focal weakness or paresthesias are detected Psychiatric:  The pt has Normal affect.   Non-Invasive Vascular Imaging:   Summary:  Right Carotid: Velocities in the right ICA are consistent with a 40-59% stenosis.   Left Carotid: Velocities in the left ICA are consistent with a 1-39% stenosis.   Vertebrals:  Bilateral vertebral arteries demonstrate antegrade flow.  Subclavians: Normal flow hemodynamics were seen in bilateral subclavian arteries.     ASSESSMENT/PLAN:: 87 y.o. male here for follow up for carotid artery stenosis. He additionally has some  mild arterial disease. He remains without any  associated neurological symptoms. He does not have any claudication, rest pain or tissue loss. His carotid duplex today shows stable right ICA stenosis of 40-59%, left ICA stenosis of 1-39%. Normal flow in his vertebral and subclavian arteries bilaterally  - Continue Aspirin, statin, Xarelto - Discussed importance of both good blood sugar and blood pressure control - Discussed importance of frequent foot checks and keeping feet protected - Reviewed signs and symptoms of TIA and stroke and he understands should those occur to call 911 or seek immediate medical attention -He will follow up in 1 year with repeat carotid duplex. He knows to call if he has any new or concerning symptoms    Karoline Caldwell, PA-C [facility] [phone]  Clinic MD:   Scot Dock

## 2022-01-02 ENCOUNTER — Other Ambulatory Visit (HOSPITAL_COMMUNITY): Payer: Self-pay

## 2022-01-02 MED ORDER — TRULICITY 1.5 MG/0.5ML ~~LOC~~ SOAJ
SUBCUTANEOUS | 2 refills | Status: DC
  Filled 2022-01-02: qty 2, 28d supply, fill #0

## 2022-01-05 ENCOUNTER — Other Ambulatory Visit (HOSPITAL_COMMUNITY): Payer: Self-pay

## 2022-01-15 DIAGNOSIS — I779 Disorder of arteries and arterioles, unspecified: Secondary | ICD-10-CM | POA: Diagnosis not present

## 2022-01-15 DIAGNOSIS — E782 Mixed hyperlipidemia: Secondary | ICD-10-CM | POA: Diagnosis not present

## 2022-01-15 DIAGNOSIS — N184 Chronic kidney disease, stage 4 (severe): Secondary | ICD-10-CM | POA: Diagnosis not present

## 2022-01-15 DIAGNOSIS — I129 Hypertensive chronic kidney disease with stage 1 through stage 4 chronic kidney disease, or unspecified chronic kidney disease: Secondary | ICD-10-CM | POA: Diagnosis not present

## 2022-01-15 DIAGNOSIS — E1165 Type 2 diabetes mellitus with hyperglycemia: Secondary | ICD-10-CM | POA: Diagnosis not present

## 2022-01-15 DIAGNOSIS — E1151 Type 2 diabetes mellitus with diabetic peripheral angiopathy without gangrene: Secondary | ICD-10-CM | POA: Diagnosis not present

## 2022-01-22 DIAGNOSIS — I129 Hypertensive chronic kidney disease with stage 1 through stage 4 chronic kidney disease, or unspecified chronic kidney disease: Secondary | ICD-10-CM | POA: Diagnosis not present

## 2022-01-22 DIAGNOSIS — E1151 Type 2 diabetes mellitus with diabetic peripheral angiopathy without gangrene: Secondary | ICD-10-CM | POA: Diagnosis not present

## 2022-01-22 DIAGNOSIS — I779 Disorder of arteries and arterioles, unspecified: Secondary | ICD-10-CM | POA: Diagnosis not present

## 2022-01-22 DIAGNOSIS — E782 Mixed hyperlipidemia: Secondary | ICD-10-CM | POA: Diagnosis not present

## 2022-01-22 DIAGNOSIS — E11319 Type 2 diabetes mellitus with unspecified diabetic retinopathy without macular edema: Secondary | ICD-10-CM | POA: Diagnosis not present

## 2022-02-01 ENCOUNTER — Other Ambulatory Visit (HOSPITAL_COMMUNITY): Payer: Self-pay

## 2022-02-01 DIAGNOSIS — E872 Acidosis, unspecified: Secondary | ICD-10-CM | POA: Diagnosis not present

## 2022-02-01 DIAGNOSIS — E11319 Type 2 diabetes mellitus with unspecified diabetic retinopathy without macular edema: Secondary | ICD-10-CM | POA: Diagnosis not present

## 2022-02-01 DIAGNOSIS — I129 Hypertensive chronic kidney disease with stage 1 through stage 4 chronic kidney disease, or unspecified chronic kidney disease: Secondary | ICD-10-CM | POA: Diagnosis not present

## 2022-02-01 DIAGNOSIS — D631 Anemia in chronic kidney disease: Secondary | ICD-10-CM | POA: Diagnosis not present

## 2022-02-01 DIAGNOSIS — N184 Chronic kidney disease, stage 4 (severe): Secondary | ICD-10-CM | POA: Diagnosis not present

## 2022-02-01 DIAGNOSIS — N2581 Secondary hyperparathyroidism of renal origin: Secondary | ICD-10-CM | POA: Diagnosis not present

## 2022-02-01 MED ORDER — TRULICITY 3 MG/0.5ML ~~LOC~~ SOAJ
SUBCUTANEOUS | 2 refills | Status: DC
  Filled 2022-02-01: qty 2, 28d supply, fill #0

## 2022-02-12 DIAGNOSIS — E1165 Type 2 diabetes mellitus with hyperglycemia: Secondary | ICD-10-CM | POA: Diagnosis not present

## 2022-02-13 ENCOUNTER — Encounter: Payer: Self-pay | Admitting: Podiatry

## 2022-02-13 ENCOUNTER — Ambulatory Visit (INDEPENDENT_AMBULATORY_CARE_PROVIDER_SITE_OTHER): Payer: Medicare PPO | Admitting: Podiatry

## 2022-02-13 DIAGNOSIS — M2012 Hallux valgus (acquired), left foot: Secondary | ICD-10-CM | POA: Diagnosis not present

## 2022-02-13 DIAGNOSIS — M79675 Pain in left toe(s): Secondary | ICD-10-CM | POA: Diagnosis not present

## 2022-02-13 DIAGNOSIS — M2011 Hallux valgus (acquired), right foot: Secondary | ICD-10-CM

## 2022-02-13 DIAGNOSIS — Q828 Other specified congenital malformations of skin: Secondary | ICD-10-CM

## 2022-02-13 DIAGNOSIS — B351 Tinea unguium: Secondary | ICD-10-CM | POA: Diagnosis not present

## 2022-02-13 DIAGNOSIS — M79674 Pain in right toe(s): Secondary | ICD-10-CM

## 2022-02-13 DIAGNOSIS — H547 Unspecified visual loss: Secondary | ICD-10-CM | POA: Insufficient documentation

## 2022-02-13 DIAGNOSIS — M2041 Other hammer toe(s) (acquired), right foot: Secondary | ICD-10-CM

## 2022-02-13 DIAGNOSIS — E119 Type 2 diabetes mellitus without complications: Secondary | ICD-10-CM

## 2022-02-13 DIAGNOSIS — E0843 Diabetes mellitus due to underlying condition with diabetic autonomic (poly)neuropathy: Secondary | ICD-10-CM

## 2022-02-13 DIAGNOSIS — M2042 Other hammer toe(s) (acquired), left foot: Secondary | ICD-10-CM

## 2022-02-13 DIAGNOSIS — R269 Unspecified abnormalities of gait and mobility: Secondary | ICD-10-CM | POA: Insufficient documentation

## 2022-02-13 DIAGNOSIS — R4182 Altered mental status, unspecified: Secondary | ICD-10-CM | POA: Insufficient documentation

## 2022-02-18 NOTE — Progress Notes (Signed)
ANNUAL DIABETIC FOOT EXAM ? ?Subjective: ?Jonathan Baird presents today for annual diabetic foot examination. ? ?Patient relates 81 year h/o diabetes. ? ?Patient denies any h/o foot wounds. ? ?Patient has been diagnosed with neuropathy. ? ?Patient's blood sugar was 122 mg/dl today. ? ?Risk factors: diabetes, PAD, CKD, hyperlipidemia. ? ?Merrilee Seashore, MD is patient's PCP. Last visit was February 12, 2022. ? ?Past Medical History:  ?Diagnosis Date  ? Carotid artery occlusion   ? Colon cancer ([place]) 10/12/2004  ? stage III  ? Diabetes mellitus   ? ?Patient Active Problem List  ? Diagnosis Date Noted  ? Abnormal gait 02/13/2022  ? Altered mental status 02/13/2022  ? Unspecified visual loss 02/13/2022  ? Stage 3b chronic kidney disease ([place]) 09/25/2021  ? Stenosis of carotid artery 09/25/2021  ? Essential hypertension 01/17/2021  ? Chronic kidney disease due to hypertension 01/17/2021  ? Diabetic renal disease ([place]) 01/17/2021  ? Diabetic retinopathy associated with type 2 diabetes mellitus ([place]) 01/17/2021  ? Disorder of arteries and arterioles, unspecified ([place]) 01/17/2021  ? Drug-induced hypoglycemia without coma 01/17/2021  ? Adult general medical exam 01/17/2021  ? Hyperglycemia due to type 2 diabetes mellitus ([place]) 01/17/2021  ? Long term (current) use of insulin ([place]) 01/17/2021  ? Mixed hyperlipidemia 01/17/2021  ? Peripheral vascular disease ([place]) 01/17/2021  ? Controlled diabetes mellitus with stable proliferative retinopathy of both eyes ([place]) 08/10/2020  ? Right epiretinal membrane 08/10/2020  ? 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 degeneration of both eyes with subfoveal involvement 08/10/2020  ? 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 Colon cancer ([place]) 10/12/2004  ? ?Past Surgical History:  ?Procedure Laterality Date  ? hemocolectomy  2005  ? ?Current Outpatient Medications on File Prior to Visit  ?Medication Sig Dispense Refill  ? sodium bicarbonate 650 MG tablet 1 tablet    ? amLODipine  (NORVASC) 10 MG tablet Take 10 mg by mouth daily.  3  ? amLODipine (NORVASC) 10 MG tablet 1 tablet    ? amLODipine (NORVASC) 2.5 MG tablet Take 2.5 mg by mouth daily.    ? aspirin (ASPIRIN 81) 81 MG chewable tablet 1 tablet    ? B-D ULTRAFINE III SHORT PEN 31G X 8 MM MISC Inject into the skin 2 (two) times daily.    ? calcitRIOL (ROCALTROL) 0.25 MCG capsule     ? Continuous Blood Gluc Sensor (FREESTYLE LIBRE 2 SENSOR) MISC See admin instructions.    ? Dulaglutide (TRULICITY) 1.5 KZ/9.9JT SOPN 1.5 mg    ? Dulaglutide (TRULICITY) 1.5 TS/1.7BL SOPN Inject 1 pen (1.5 mg) into skin weekly 28 days 2 mL 2  ? Dulaglutide (TRULICITY) 3 TJ/0.3ES SOPN Inject 3 mg into the skin once weekly 28 days 2 mL 2  ? glipiZIDE (GLUCOTROL) 5 MG tablet Take 5 mg by mouth 2 (two) times daily before a meal. '5mg'$  BID ?2.5 once daily    ? GVOKE HYPOPEN 2-PACK 1 MG/0.2ML SOAJ Inject into the skin.    ? hydrALAZINE (APRESOLINE) 25 MG tablet TAKE 1 TABLET BY MOUTH THREE TIMES A DAY WITH FOOD FOR 30 DAYS 90    ? hydrALAZINE (APRESOLINE) 50 MG tablet 1 tablet    ? hydrochlorothiazide (MICROZIDE) 12.5 MG capsule Take 2.5 mg by mouth daily.    ? insulin aspart (NOVOLOG FLEXPEN) 100 UNIT/ML FlexPen 3 units    ? Insulin Glargine (BASAGLAR KWIKPEN) 100 UNIT/ML 6 units    ? insulin lispro (HUMALOG KWIKPEN) 100 UNIT/ML KwikPen See admin instructions.    ?  JANUVIA 25 MG tablet     ? lisinopril (PRINIVIL,ZESTRIL) 40 MG tablet Take 40 mg by mouth daily.    ? lisinopril (ZESTRIL) 40 MG tablet 1 tablet    ? pravastatin (PRAVACHOL) 80 MG tablet Take 70 mg by mouth daily.    ? saxagliptin HCl (ONGLYZA) 5 MG TABS tablet Take by mouth daily. Cut '5mg'$  in half    ? sodium bicarbonate 650 MG tablet Take 650 mg by mouth 3 (three) times daily.  5  ? XARELTO 2.5 MG TABS tablet TAKE 1 TABLET BY MOUTH TWICE A DAY. TAKE WITH ASPIRIN '81MG'$  DAILY.  3  ? ?No current facility-administered medications on file prior to visit.  ?  ?No Known Allergies ?Social History  ? ?Occupational  History  ? Not on file  ?Tobacco Use  ? Smoking status: Never  ? Smokeless tobacco: Never  ?Vaping Use  ? Vaping Use: Never used  ?Substance and Sexual Activity  ? Alcohol use: No  ? Drug use: No  ? Sexual activity: Yes  ? ?Family History  ?Problem Relation Age of Onset  ? Cancer Mother   ? Diabetes Father   ? Cancer Sister   ? ? ?There is no immunization history on file for this patient.  ? ?Review of Systems: Negative except as noted in the HPI.  ? ?Objective: ?There were no vitals filed for this visit. ? ?Jonathan Baird is a pleasant 88 y.o. male in NAD. AAO X 3. ? ?Vascular Examination: ?CFT <3 seconds b/l LE. Palpable DP pulse(s) b/l LE. Palpable PT pulse(s) b/l LE. Pedal hair absent. No pain with calf compression b/l. Lower extremity skin temperature gradient within normal limits. No edema noted b/l LE. No cyanosis or clubbing noted b/l LE. ? ?Dermatological Examination: ?Pedal integument with normal turgor, texture and tone BLE. No open wounds b/l LE. No interdigital macerations noted b/l LE. Toenails 1-5 bilaterally elongated, discolored, dystrophic, thickened, and crumbly with subungual debris and tenderness to dorsal palpation. Porokeratotic lesion(s) submet head 1 b/l and submet head 5 b/l. No erythema, no edema, no drainage, no fluctuance. ? ?Neurological Examination: ?Protective sensation diminished with 10g monofilament b/l. ? ?Musculoskeletal Examination: ?Muscle strength 5/5 to all lower extremity muscle groups bilaterally. HAV with bunion deformity noted b/l LE. Hammertoe deformity noted 2-5 b/l. ? ?Footwear Assessment: ?Does the patient wear appropriate shoes? Yes. ?Does the patient need inserts/orthotics? Yes. ? ?Assessment: ?1. Pain due to onychomycosis of toenails of both feet   ?2. Porokeratosis   ?3. Hallux valgus, acquired, bilateral   ?4. Acquired hammertoes of both feet   ?5. Diabetes mellitus due to underlying condition with diabetic autonomic neuropathy, unspecified whether long term  insulin use ([place])   ?6. Encounter for diabetic foot exam ([place])   ?  ?ADA Risk Categorization: ?High Risk  ?Patient has one or more of the following: ?Loss of protective sensation ?Absent pedal pulses ?Severe Foot deformity ?History of foot ulcer ? ?Plan: ?-Patient was evaluated and treated. All patient's and/or POA's questions/concerns answered on today's visit. ?-Diabetic foot examination performed today. ?-Continue foot and shoe inspections daily. Monitor blood glucose per PCP/Endocrinologist's recommendations. ?-Toenails 1-5 b/l were debrided in length and girth with sterile nail nippers and dremel without iatrogenic bleeding.  ?-Painful porokeratotic lesion(s) submet head 1 b/l and submet head 5 b/l pared and enucleated with sterile scalpel blade without incident. Total number of lesions debrided=4. ?-Patient/POA to call should there be question/concern in the interim. ?Return in about 3 months (around 05/15/2022). ? ?  Marzetta Board, DPM ?

## 2022-02-22 DIAGNOSIS — E1165 Type 2 diabetes mellitus with hyperglycemia: Secondary | ICD-10-CM | POA: Diagnosis not present

## 2022-02-22 DIAGNOSIS — E1140 Type 2 diabetes mellitus with diabetic neuropathy, unspecified: Secondary | ICD-10-CM | POA: Diagnosis not present

## 2022-02-22 DIAGNOSIS — I129 Hypertensive chronic kidney disease with stage 1 through stage 4 chronic kidney disease, or unspecified chronic kidney disease: Secondary | ICD-10-CM | POA: Diagnosis not present

## 2022-02-22 DIAGNOSIS — E1151 Type 2 diabetes mellitus with diabetic peripheral angiopathy without gangrene: Secondary | ICD-10-CM | POA: Diagnosis not present

## 2022-03-01 DIAGNOSIS — Z794 Long term (current) use of insulin: Secondary | ICD-10-CM | POA: Diagnosis not present

## 2022-03-01 DIAGNOSIS — I739 Peripheral vascular disease, unspecified: Secondary | ICD-10-CM | POA: Diagnosis not present

## 2022-03-01 DIAGNOSIS — E1165 Type 2 diabetes mellitus with hyperglycemia: Secondary | ICD-10-CM | POA: Diagnosis not present

## 2022-03-01 DIAGNOSIS — I129 Hypertensive chronic kidney disease with stage 1 through stage 4 chronic kidney disease, or unspecified chronic kidney disease: Secondary | ICD-10-CM | POA: Diagnosis not present

## 2022-03-14 DIAGNOSIS — E1165 Type 2 diabetes mellitus with hyperglycemia: Secondary | ICD-10-CM | POA: Diagnosis not present

## 2022-04-11 DIAGNOSIS — I129 Hypertensive chronic kidney disease with stage 1 through stage 4 chronic kidney disease, or unspecified chronic kidney disease: Secondary | ICD-10-CM | POA: Diagnosis not present

## 2022-04-11 DIAGNOSIS — N184 Chronic kidney disease, stage 4 (severe): Secondary | ICD-10-CM | POA: Diagnosis not present

## 2022-04-11 DIAGNOSIS — E782 Mixed hyperlipidemia: Secondary | ICD-10-CM | POA: Diagnosis not present

## 2022-04-11 DIAGNOSIS — I10 Essential (primary) hypertension: Secondary | ICD-10-CM | POA: Diagnosis not present

## 2022-04-14 DIAGNOSIS — E1165 Type 2 diabetes mellitus with hyperglycemia: Secondary | ICD-10-CM | POA: Diagnosis not present

## 2022-04-26 ENCOUNTER — Emergency Department (HOSPITAL_COMMUNITY): Payer: Medicare PPO

## 2022-04-26 ENCOUNTER — Other Ambulatory Visit: Payer: Self-pay

## 2022-04-26 ENCOUNTER — Encounter (HOSPITAL_COMMUNITY): Payer: Self-pay

## 2022-04-26 ENCOUNTER — Emergency Department (HOSPITAL_COMMUNITY)
Admission: EM | Admit: 2022-04-26 | Discharge: 2022-04-26 | Disposition: A | Discharge status: Home / Self Care | Payer: Medicare PPO | Attending: Emergency Medicine | Admitting: Emergency Medicine

## 2022-04-26 DIAGNOSIS — I6782 Cerebral ischemia: Secondary | ICD-10-CM | POA: Diagnosis not present

## 2022-04-26 DIAGNOSIS — E1122 Type 2 diabetes mellitus with diabetic chronic kidney disease: Secondary | ICD-10-CM | POA: Insufficient documentation

## 2022-04-26 DIAGNOSIS — Z7984 Long term (current) use of oral hypoglycemic drugs: Secondary | ICD-10-CM | POA: Insufficient documentation

## 2022-04-26 DIAGNOSIS — R531 Weakness: Secondary | ICD-10-CM | POA: Diagnosis not present

## 2022-04-26 DIAGNOSIS — Z7901 Long term (current) use of anticoagulants: Secondary | ICD-10-CM | POA: Insufficient documentation

## 2022-04-26 DIAGNOSIS — Z79899 Other long term (current) drug therapy: Secondary | ICD-10-CM | POA: Diagnosis not present

## 2022-04-26 DIAGNOSIS — Z20822 Contact with and (suspected) exposure to covid-19: Secondary | ICD-10-CM | POA: Diagnosis not present

## 2022-04-26 DIAGNOSIS — K116 Mucocele of salivary gland: Secondary | ICD-10-CM | POA: Diagnosis not present

## 2022-04-26 DIAGNOSIS — Z8679 Personal history of other diseases of the circulatory system: Secondary | ICD-10-CM

## 2022-04-26 DIAGNOSIS — Z7982 Long term (current) use of aspirin: Secondary | ICD-10-CM | POA: Diagnosis not present

## 2022-04-26 DIAGNOSIS — Z85038 Personal history of other malignant neoplasm of large intestine: Secondary | ICD-10-CM | POA: Insufficient documentation

## 2022-04-26 DIAGNOSIS — I639 Cerebral infarction, unspecified: Secondary | ICD-10-CM | POA: Diagnosis not present

## 2022-04-26 DIAGNOSIS — I10 Essential (primary) hypertension: Secondary | ICD-10-CM | POA: Diagnosis not present

## 2022-04-26 DIAGNOSIS — G319 Degenerative disease of nervous system, unspecified: Secondary | ICD-10-CM | POA: Diagnosis not present

## 2022-04-26 DIAGNOSIS — N184 Chronic kidney disease, stage 4 (severe): Secondary | ICD-10-CM | POA: Insufficient documentation

## 2022-04-26 DIAGNOSIS — I2510 Atherosclerotic heart disease of native coronary artery without angina pectoris: Secondary | ICD-10-CM | POA: Diagnosis not present

## 2022-04-26 DIAGNOSIS — N189 Chronic kidney disease, unspecified: Secondary | ICD-10-CM | POA: Diagnosis not present

## 2022-04-26 DIAGNOSIS — Z794 Long term (current) use of insulin: Secondary | ICD-10-CM | POA: Diagnosis not present

## 2022-04-26 DIAGNOSIS — I129 Hypertensive chronic kidney disease with stage 1 through stage 4 chronic kidney disease, or unspecified chronic kidney disease: Secondary | ICD-10-CM | POA: Diagnosis not present

## 2022-04-26 DIAGNOSIS — R4789 Other speech disturbances: Secondary | ICD-10-CM | POA: Diagnosis not present

## 2022-04-26 DIAGNOSIS — R4182 Altered mental status, unspecified: Secondary | ICD-10-CM | POA: Diagnosis not present

## 2022-04-26 LAB — COMPREHENSIVE METABOLIC PANEL
ALT: 10 U/L (ref 0–44)
AST: 11 U/L — ABNORMAL LOW (ref 15–41)
Albumin: 3.6 g/dL (ref 3.5–5.0)
Alkaline Phosphatase: 102 U/L (ref 38–126)
Anion gap: 9 (ref 5–15)
BUN: 33 mg/dL — ABNORMAL HIGH (ref 8–23)
CO2: 25 mmol/L (ref 22–32)
Calcium: 9.7 mg/dL (ref 8.9–10.3)
Chloride: 103 mmol/L (ref 98–111)
Creatinine, Ser: 2.45 mg/dL — ABNORMAL HIGH (ref 0.61–1.24)
GFR, Estimated: 25 mL/min — ABNORMAL LOW (ref >60)
Glucose, Bld: 115 mg/dL — ABNORMAL HIGH (ref 70–99)
Potassium: 4.8 mmol/L (ref 3.5–5.1)
Sodium: 137 mmol/L (ref 135–145)
Total Bilirubin: 0.5 mg/dL (ref 0.3–1.2)
Total Protein: 7.4 g/dL (ref 6.5–8.1)

## 2022-04-26 LAB — RAPID URINE DRUG SCREEN, HOSP PERFORMED
Amphetamines: NOT DETECTED
Barbiturates: NOT DETECTED
Benzodiazepines: NOT DETECTED
Cocaine: NOT DETECTED
Opiates: NOT DETECTED
Tetrahydrocannabinol: NOT DETECTED

## 2022-04-26 LAB — URINALYSIS, ROUTINE W REFLEX MICROSCOPIC
Bilirubin Urine: NEGATIVE
Glucose, UA: NEGATIVE mg/dL
Hgb urine dipstick: NEGATIVE
Ketones, ur: NEGATIVE mg/dL
Leukocytes,Ua: NEGATIVE
Nitrite: NEGATIVE
Protein, ur: NEGATIVE mg/dL
Specific Gravity, Urine: 1.01 (ref 1.005–1.030)
pH: 7 (ref 5.0–8.0)

## 2022-04-26 LAB — CBC
HCT: 31.9 % — ABNORMAL LOW (ref 39.0–52.0)
Hemoglobin: 10.8 g/dL — ABNORMAL LOW (ref 13.0–17.0)
MCH: 27 pg (ref 26.0–34.0)
MCHC: 33.9 g/dL (ref 30.0–36.0)
MCV: 79.8 fL — ABNORMAL LOW (ref 80.0–100.0)
Platelets: 233 10*3/uL (ref 150–400)
RBC: 4 MIL/uL — ABNORMAL LOW (ref 4.22–5.81)
RDW: 14.5 % (ref 11.5–15.5)
WBC: 8.7 10*3/uL (ref 4.0–10.5)
nRBC: 0 % (ref 0.0–0.2)

## 2022-04-26 LAB — APTT: aPTT: 32 seconds (ref 24–36)

## 2022-04-26 LAB — DIFFERENTIAL
Abs Immature Granulocytes: 0.03 10*3/uL (ref 0.00–0.07)
Basophils Absolute: 0 10*3/uL (ref 0.0–0.1)
Basophils Relative: 1 %
Eosinophils Absolute: 0.3 10*3/uL (ref 0.0–0.5)
Eosinophils Relative: 3 %
Immature Granulocytes: 0 %
Lymphocytes Relative: 19 %
Lymphs Abs: 1.6 10*3/uL (ref 0.7–4.0)
Monocytes Absolute: 0.5 10*3/uL (ref 0.1–1.0)
Monocytes Relative: 6 %
Neutro Abs: 6.2 10*3/uL (ref 1.7–7.7)
Neutrophils Relative %: 71 %

## 2022-04-26 LAB — RESP PANEL BY RT-PCR (FLU A&B, COVID) ARPGX2
Influenza A by PCR: NEGATIVE
Influenza B by PCR: NEGATIVE
SARS Coronavirus 2 by RT PCR: NEGATIVE

## 2022-04-26 LAB — PROTIME-INR
INR: 1.2 (ref 0.8–1.2)
Prothrombin Time: 14.9 seconds (ref 11.4–15.2)

## 2022-04-26 MED ORDER — SODIUM CHLORIDE 0.9 % IV BOLUS
1000.0000 mL | Freq: Once | INTRAVENOUS | Status: AC
Start: 2022-04-26 — End: 2022-04-26
  Administered 2022-04-26: 1000 mL via INTRAVENOUS

## 2022-04-26 NOTE — ED Triage Notes (Addendum)
PT arrives via EMS from home, per pts wife pt had period of confusion around 12pm today  where he had trouble talking. that has since resolved, per wife pt has history of TIAs. Pts only complaint at this time is generalized weakness. Vitals en route 99% on room air BP 146/82 HR 64

## 2022-04-26 NOTE — ED Notes (Signed)
Attempted IV access x2, EMS attempted access x2 en route as well

## 2022-04-26 NOTE — ED Notes (Signed)
Pt off unit for testing

## 2022-04-26 NOTE — ED Provider Notes (Signed)
[facility] EMERGENCY DEPARTMENT Provider Note   CSN: [id number] Arrival date & time: 04/26/22  1549     History  Chief Complaint  Patient presents with   Weakness    Jonathan Baird is a 88 y.o. male.  Pt is a 88 yo male with a pmhx significant for colon cancer, DM, HTN, PVD, and carotid artery disease.  Pt's wife said he was normal this morning and ate breakfast.  She went out and when she came back, he was confused.  This happened around noon.  She said he was not making sense when she tried to talk to him.  He is back to normal now.  Pt said he feels fine.         Home Medications Prior to Admission medications   Medication Sig Start Date End Date Taking? Authorizing Provider  amLODipine (NORVASC) 10 MG tablet Take 10 mg by mouth daily. 03/05/18   [provider]  amLODipine (NORVASC) 10 MG tablet 1 tablet    [provider]  amLODipine (NORVASC) 2.5 MG tablet Take 2.5 mg by mouth daily.    [provider]  aspirin (ASPIRIN 81) 81 MG chewable tablet 1 tablet    [provider]  B-D ULTRAFINE III SHORT PEN 31G X 8 MM MISC Inject into the skin 2 (two) times daily. 10/26/20   [provider]  calcitRIOL (ROCALTROL) 0.25 MCG capsule  03/15/18   [provider]  Continuous Blood Gluc Sensor (FREESTYLE LIBRE 2 SENSOR) MISC See admin instructions.    [provider]  Dulaglutide (TRULICITY) 1.5 QQ/5.9DG SOPN 1.5 mg    [provider]  Dulaglutide (TRULICITY) 1.5 LO/7.5IE SOPN Inject 1 pen (1.5 mg) into skin weekly 28 days 01/02/22     Dulaglutide (TRULICITY) 3 PP/2.9JJ SOPN Inject 3 mg into the skin once weekly 28 days 02/01/22     glipiZIDE (GLUCOTROL) 5 MG tablet Take 5 mg by mouth 2 (two) times daily before a meal. '5mg'$  BID 2.5 once daily    [provider]  GVOKE HYPOPEN 2-PACK 1 MG/0.2ML SOAJ Inject into the skin. 02/08/22   [provider]  hydrALAZINE (APRESOLINE) 25 MG  tablet TAKE 1 TABLET BY MOUTH THREE TIMES A DAY WITH FOOD FOR 30 DAYS 90    [provider]  hydrALAZINE (APRESOLINE) 50 MG tablet 1 tablet    [provider]  hydrochlorothiazide (MICROZIDE) 12.5 MG capsule Take 2.5 mg by mouth daily.    [provider]  insulin aspart (NOVOLOG FLEXPEN) 100 UNIT/ML FlexPen 3 units    [provider]  Insulin Glargine (BASAGLAR KWIKPEN) 100 UNIT/ML 6 units    [provider]  insulin lispro (HUMALOG KWIKPEN) 100 UNIT/ML KwikPen See admin instructions.    [provider]  JANUVIA 25 MG tablet  03/17/18   [provider]  lisinopril (PRINIVIL,ZESTRIL) 40 MG tablet Take 40 mg by mouth daily.    [provider]  lisinopril (ZESTRIL) 40 MG tablet 1 tablet    [provider]  pravastatin (PRAVACHOL) 80 MG tablet Take 70 mg by mouth daily.    [provider]  saxagliptin HCl (ONGLYZA) 5 MG TABS tablet Take by mouth daily. Cut '5mg'$  in half    [provider]  sodium bicarbonate 650 MG tablet Take 650 mg by mouth 3 (three) times daily. 07/16/15   [provider]  sodium bicarbonate 650 MG tablet 1 tablet 03/06/21   [provider]  Alveda Reasons  2.5 MG TABS tablet TAKE 1 TABLET BY MOUTH TWICE A DAY. TAKE WITH ASPIRIN '81MG'$  DAILY. 03/04/18   [provider]      Allergies    Patient has no known allergies.    Review of Systems   Review of Systems  Neurological:        Confusion  All other systems reviewed and are negative.   Physical Exam Updated Vital Signs BP (!) 156/80   Pulse (!) 58   Temp 98.3 F (36.8 C) (Oral)   Resp 14   SpO2 100%  Physical Exam Vitals and nursing note reviewed.  Constitutional:      Appearance: Normal appearance.  HENT:     Head: Normocephalic and atraumatic.     Right Ear: External ear normal.     Left Ear: External ear normal.     Nose: Nose normal.     Mouth/Throat:     Mouth: Mucous membranes are moist.      Pharynx: Oropharynx is clear.  Eyes:     Extraocular Movements: Extraocular movements intact.     Conjunctiva/sclera: Conjunctivae normal.     Pupils: Pupils are equal, round, and reactive to light.  Cardiovascular:     Rate and Rhythm: Normal rate and regular rhythm.     Pulses: Normal pulses.     Heart sounds: Normal heart sounds.  Pulmonary:     Effort: Pulmonary effort is normal.     Breath sounds: Normal breath sounds.  Abdominal:     General: Abdomen is flat. Bowel sounds are normal.     Palpations: Abdomen is soft.  Musculoskeletal:        General: Normal range of motion.     Cervical back: Normal range of motion and neck supple.  Skin:    Capillary Refill: Capillary refill takes less than 2 seconds.  Neurological:     General: No focal deficit present.     Mental Status: He is alert and oriented to person, place, and time.  Psychiatric:        Mood and Affect: Mood normal.        Behavior: Behavior normal.     ED Results / Procedures / Treatments   Labs (all labs ordered are listed, but only abnormal results are displayed) Labs Reviewed  CBC - Abnormal; Notable for the following components:      Result Value   RBC 4.00 (*)    Hemoglobin 10.8 (*)    HCT 31.9 (*)    MCV 79.8 (*)    All other components within normal limits  COMPREHENSIVE METABOLIC PANEL - Abnormal; Notable for the following components:   Glucose, Bld 115 (*)    BUN 33 (*)    Creatinine, Ser 2.45 (*)    AST 11 (*)    GFR, Estimated 25 (*)    All other components within normal limits  URINALYSIS, ROUTINE W REFLEX MICROSCOPIC - Abnormal; Notable for the following components:   Color, Urine STRAW (*)    All other components within normal limits  RESP PANEL BY RT-PCR (FLU A&B, COVID) ARPGX2  PROTIME-INR  APTT  DIFFERENTIAL  RAPID URINE DRUG SCREEN, HOSP PERFORMED  ETHANOL  I-STAT CHEM 8, ED    EKG EKG Interpretation  Date/Time:  Thursday April 26 2022 16:43:40 EDT Ventricular Rate:   63 PR Interval:  220 QRS Duration: 86 QT Interval:  416 QTC Calculation: 425 R Axis:   60 Text Interpretation: Sinus rhythm with 1st degree A-V block Otherwise  normal ECG When compared with ECG of 24-Mar-2007 11:43, PREVIOUS ECG IS PRESENT No significant change since last tracing Confirmed by Isla Pence [phone]) on 04/26/2022 6:46:19 PM  Radiology MR BRAIN WO CONTRAST  Result Date: 04/26/2022 CLINICAL DATA:  Initial evaluation for neuro deficit, stroke suspected. EXAM: MRI HEAD WITHOUT CONTRAST TECHNIQUE: Multiplanar, multiecho pulse sequences of the brain and surrounding structures were obtained without intravenous contrast. COMPARISON:  Prior CT from earlier the same day. FINDINGS: Brain: Generalized age-related cerebral atrophy. Patchy and confluent T2/FLAIR hyperintensity seen involving the periventricular and deep white matter both cerebral hemispheres as well as the pons, likely enlarged part reflecting changes of chronic microvascular ischemic disease. A degree of transependymal flow of CSF could be contributory. Few small remote lacunar infarcts present about the thalami bilaterally. 9 mm focus of mild diffusion abnormality seen involving the subcortical left frontal lobe (series 2, image 37). Additional punctate 4 mm focus of mild diffusion abnormality noted at the right frontal corona radiata (series 2, image 36). No clear ADC correlate. Findings likely reflect small evolving subacute small vessel type infarcts. No associated hemorrhage or mass effect. No other evidence for acute or subacute ischemia. Gray-white matter differentiation otherwise maintained. No acute intracranial hemorrhage. Few scattered chronic micro hemorrhages noted, likely hypertensive/small vessel related. No mass lesion or midline shift. Diffuse ventricular prominence somewhat out of proportion to cortical sulcation. No extra-axial fluid collection. Pituitary gland suprasellar region normal. Vascular: Major intracranial  vascular flow voids grossly maintained at the skull base. Skull and upper cervical spine: Cranial junction within normal limits. Bone marrow signal intensity normal. No scalp soft tissue abnormality. Sinuses/Orbits: Prior bilateral ocular lens replacement. Mild scattered mucosal thickening about the ethmoidal air cells and maxillary sinuses. Paranasal sinuses are otherwise clear. No significant mastoid effusion. Other: Subcentimeter cystic focus noted within the right parotid gland, of doubtful significance. IMPRESSION: 1. 9 mm focus of mild diffusion abnormality involving the subcortical left frontal lobe, with additional 4 mm focus of mild diffusion abnormality at the right frontal corona radiata. Findings likely reflect small evolving subacute small vessel type infarcts. No associated hemorrhage or mass effect. 2. Diffuse ventricular prominence somewhat out of proportion to cortical sulcation. While this finding may be related to underlying atrophy, a degree of NPH could be contributory, and could be considered in the correct clinical setting. 3. Underlying age-related cerebral atrophy with moderate chronic microvascular ischemic disease. Electronically Signed   By: Jeannine Boga M.D.   On: 04/26/2022 21:29   DG Chest 2 View  Result Date: 04/26/2022 CLINICAL DATA:  Altered mental status, dizzy confusion EXAM: CHEST - 2 VIEW COMPARISON:  03/24/2007 FINDINGS: The heart size and mediastinal contours are within normal limits given AP technique. Both lungs are clear. The visualized skeletal structures are unremarkable. IMPRESSION: No active cardiopulmonary disease. Electronically Signed   By: Merilyn Baba M.D.   On: 04/26/2022 18:12   CT HEAD WO CONTRAST  Result Date: 04/26/2022 CLINICAL DATA:  Confusion, difficulty speaking generalized weakness EXAM: CT HEAD WITHOUT CONTRAST TECHNIQUE: Contiguous axial images were obtained from the base of the skull through the vertex without intravenous contrast.  RADIATION DOSE REDUCTION: This exam was performed according to the departmental dose-optimization program which includes automated exposure control, adjustment of the mA and/or kV according to patient size and/or use of iterative reconstruction technique. COMPARISON:  05/15/2005 FINDINGS: Brain: No evidence of acute infarction, hemorrhage, cerebral edema, mass, mass effect, or midline shift. No extra-axial fluid collection. Periventricular white matter changes, likely the sequela  of chronic small vessel ischemic disease. Advanced cerebral volume loss for age. The ventricles are enlarged, with the suggestion of crowding of the sulci at the vertex and an acute callosal angle, which can be seen with normal pressure hydrocephalus. Lacunar infarct in the right internal capsule. Vascular: No hyperdense vessel. Atherosclerotic calcifications in the intracranial carotid and vertebral arteries. Skull: Normal. Negative for fracture or focal lesion. Sinuses/Orbits: No acute finding. Other: The mastoid air cells are well aerated. IMPRESSION: 1. Prominent ventricles, which could be the sequela of central volume loss, but there is also the suggestion of crowding of the sulci at the vertex and acute callosal angle, which can be seen in the setting of normal pressure hydrocephalus. Correlate with symptoms and consider CSF tap test, if clinically indicated. 2. No additional acute intracranial process. Electronically Signed   By: Merilyn Baba M.D.   On: 04/26/2022 17:46    Procedures Procedures    Medications Ordered in ED Medications  sodium chloride 0.9 % bolus 1,000 mL (1,000 mLs Intravenous New Bag/Given 04/26/22 1845)    ED Course/ Medical Decision Making/ A&P                           Medical Decision Making Amount and/or Complexity of Data Reviewed Radiology: ordered.   This patient presents to the ED for concern of ams, this involves an extensive number of treatment options, and is a complaint that carries  with it a high risk of complications and morbidity.  The differential diagnosis includes CVA, TIA, electrolyte abn, infection   Co morbidities that complicate the patient evaluation   colon cancer, DM, HTN, PVD, and carotid artery disease   Additional history obtained:  Additional history obtained from epic chart review External records from outside source obtained and reviewed including wife   Lab Tests:  I Ordered, and personally interpreted labs.  The pertinent results include:  cbc with hgb 10.8 (hgb 11.2 on 04/14/22); cmp with Cr 2.45 (bun 42 and Cr 2.38 on 04/14/22); ua neg; uds neg; covid/flu neg; inr 1.2   Imaging Studies ordered:  I ordered imaging studies including ct head and CXR and MRI I independently visualized and interpreted imaging which showed  CT head: IMPRESSION:  1. Prominent ventricles, which could be the sequela of central  volume loss, but there is also the suggestion of crowding of the  sulci at the vertex and acute callosal angle, which can be seen in  the setting of normal pressure hydrocephalus. Correlate with  symptoms and consider CSF tap test, if clinically indicated.  2. No additional acute intracranial process.  CXR: IMPRESSION:  No active cardiopulmonary disease.  MRI: IMPRESSION:  1. 9 mm focus of mild diffusion abnormality involving the  subcortical left frontal lobe, with additional 4 mm focus of mild  diffusion abnormality at the right frontal corona radiata. Findings  likely reflect small evolving subacute small vessel type infarcts.  No associated hemorrhage or mass effect.  2. Diffuse ventricular prominence somewhat out of proportion to  cortical sulcation. While this finding may be related to underlying  atrophy, a degree of NPH could be contributory, and could be  considered in the correct clinical setting.  3. Underlying age-related cerebral atrophy with moderate chronic  microvascular ischemic disease.   I agree with the  radiologist interpretation   Cardiac Monitoring:  The patient was maintained on a cardiac monitor.  I personally viewed and interpreted the cardiac monitored which showed  an underlying rhythm of: sinus brady   Medicines ordered and prescription drug management:  I ordered medication including ivfs  for dehydration  Reevaluation of the patient after these medicines showed that the patient improved I have reviewed the patients home medicines and have made adjustments as needed   Test Considered:  mri   Critical Interventions:  ivfs   Consultations Obtained:  I requested consultation with the neurologist (Dr. Cheral Marker),  and discussed lab and imaging findings as well as pertinent plan - he reviewed the MRI results.  He does not thinks those lesions on MRI are strokes.  They are also on the opposite side of the brain as his carotid artery stenosis.  He recommends continuing the Xarelto and ASA.     Problem List / ED Course:  AMS:  pt is back to normal. Labs chronic for pt.  No infection.  No cva.  Pt is stable for d/c.  He is to f/u with vascular and with pcp.  Return if worse.   Reevaluation:  After the interventions noted above, I reevaluated the patient and found that they have :improved   Social Determinants of Health:  Lives at home with his wife   Dispostion:  After consideration of the diagnostic results and the patients response to treatment, I feel that the patent would benefit from discharge with outpatient f/u.          Final Clinical Impression(s) / ED Diagnoses Final diagnoses:  Weakness  H/O carotid artery stenosis  CKD (chronic kidney disease) stage 4, GFR 15-29 ml/min [facility])    Rx / DC Orders ED Discharge Orders     None         Isla Pence, MD 04/26/22 2236

## 2022-04-26 NOTE — ED Notes (Signed)
Pt off unit to MRI.

## 2022-04-30 ENCOUNTER — Ambulatory Visit (INDEPENDENT_AMBULATORY_CARE_PROVIDER_SITE_OTHER): Payer: Medicare PPO | Admitting: Ophthalmology

## 2022-04-30 ENCOUNTER — Encounter (INDEPENDENT_AMBULATORY_CARE_PROVIDER_SITE_OTHER): Payer: Medicare PPO | Admitting: Ophthalmology

## 2022-04-30 ENCOUNTER — Encounter (INDEPENDENT_AMBULATORY_CARE_PROVIDER_SITE_OTHER): Payer: Self-pay | Admitting: Ophthalmology

## 2022-04-30 DIAGNOSIS — Z9889 Other specified postprocedural states: Secondary | ICD-10-CM

## 2022-04-30 DIAGNOSIS — E113553 Type 2 diabetes mellitus with stable proliferative diabetic retinopathy, bilateral: Secondary | ICD-10-CM

## 2022-04-30 DIAGNOSIS — R5383 Other fatigue: Secondary | ICD-10-CM | POA: Diagnosis not present

## 2022-04-30 DIAGNOSIS — H353134 Nonexudative age-related macular degeneration, bilateral, advanced atrophic with subfoveal involvement: Secondary | ICD-10-CM

## 2022-04-30 DIAGNOSIS — Z794 Long term (current) use of insulin: Secondary | ICD-10-CM | POA: Diagnosis not present

## 2022-04-30 DIAGNOSIS — I129 Hypertensive chronic kidney disease with stage 1 through stage 4 chronic kidney disease, or unspecified chronic kidney disease: Secondary | ICD-10-CM | POA: Diagnosis not present

## 2022-04-30 DIAGNOSIS — E11319 Type 2 diabetes mellitus with unspecified diabetic retinopathy without macular edema: Secondary | ICD-10-CM | POA: Diagnosis not present

## 2022-04-30 DIAGNOSIS — E782 Mixed hyperlipidemia: Secondary | ICD-10-CM | POA: Diagnosis not present

## 2022-04-30 DIAGNOSIS — E1151 Type 2 diabetes mellitus with diabetic peripheral angiopathy without gangrene: Secondary | ICD-10-CM | POA: Diagnosis not present

## 2022-04-30 DIAGNOSIS — I779 Disorder of arteries and arterioles, unspecified: Secondary | ICD-10-CM | POA: Diagnosis not present

## 2022-04-30 DIAGNOSIS — E1165 Type 2 diabetes mellitus with hyperglycemia: Secondary | ICD-10-CM | POA: Diagnosis not present

## 2022-04-30 NOTE — Progress Notes (Signed)
04/30/2022     CHIEF COMPLAINT Patient presents for  Chief Complaint  Patient presents with   Retina Follow Up      HISTORY OF PRESENT ILLNESS: Jonathan Baird is a 88 y.o. male who presents to the clinic today for:   HPI     Retina Follow Up           Diagnosis: Diabetic Retinopathy   Laterality: both eyes   Onset: 10 months ago         Comments   10 mos fu OU OCT FP. Patient states vision is stable and unchanged since last visit. Denies any new floaters or FOL. Patient reports he does not know his last blood sugar, last A1C: unknown as well. Patient reports he went to the hospital recently, within the past week, patient's family member called due to patient confusion, not understanding what was going on around him, per patient.  Denies surgeries or falls since last visit.      Last edited by Laurin Coder on 04/30/2022  1:18 PM.      Referring physician: Merrilee Seashore, MD [place],  [place] [id number]  HISTORICAL INFORMATION:   Selected notes from the MEDICAL RECORD NUMBER    Lab Results  Component Value Date   HGBA1C 6.8 (H) 05/13/2006     CURRENT MEDICATIONS: No current outpatient medications on file. (Ophthalmic Drugs)   No current facility-administered medications for this visit. (Ophthalmic Drugs)   Current Outpatient Medications (Other)  Medication Sig   amLODipine (NORVASC) 10 MG tablet Take 10 mg by mouth daily.   amLODipine (NORVASC) 10 MG tablet 1 tablet   amLODipine (NORVASC) 2.5 MG tablet Take 2.5 mg by mouth daily.   aspirin (ASPIRIN 81) 81 MG chewable tablet 1 tablet   B-D ULTRAFINE III SHORT PEN 31G X 8 MM MISC Inject into the skin 2 (two) times daily.   calcitRIOL (ROCALTROL) 0.25 MCG capsule    Continuous Blood Gluc Sensor (FREESTYLE LIBRE 2 SENSOR) MISC See admin instructions.   Dulaglutide (TRULICITY) 1.5 SE/8.3TD SOPN 1.5 mg   Dulaglutide (TRULICITY) 1.5 VV/6.1YW SOPN Inject 1 pen (1.5 mg)  into skin weekly 28 days   Dulaglutide (TRULICITY) 3 VP/7.1GG SOPN Inject 3 mg into the skin once weekly 28 days   glipiZIDE (GLUCOTROL) 5 MG tablet Take 5 mg by mouth 2 (two) times daily before a meal. '5mg'$  BID 2.5 once daily   GVOKE HYPOPEN 2-PACK 1 MG/0.2ML SOAJ Inject into the skin.   hydrALAZINE (APRESOLINE) 25 MG tablet TAKE 1 TABLET BY MOUTH THREE TIMES A DAY WITH FOOD FOR 30 DAYS 90   hydrALAZINE (APRESOLINE) 50 MG tablet 1 tablet   hydrochlorothiazide (MICROZIDE) 12.5 MG capsule Take 2.5 mg by mouth daily.   insulin aspart (NOVOLOG FLEXPEN) 100 UNIT/ML FlexPen 3 units   Insulin Glargine (BASAGLAR KWIKPEN) 100 UNIT/ML 6 units   insulin lispro (HUMALOG KWIKPEN) 100 UNIT/ML KwikPen See admin instructions.   JANUVIA 25 MG tablet    lisinopril (PRINIVIL,ZESTRIL) 40 MG tablet Take 40 mg by mouth daily.   lisinopril (ZESTRIL) 40 MG tablet 1 tablet   pravastatin (PRAVACHOL) 80 MG tablet Take 70 mg by mouth daily.   saxagliptin HCl (ONGLYZA) 5 MG TABS tablet Take by mouth daily. Cut '5mg'$  in half   sodium bicarbonate 650 MG tablet Take 650 mg by mouth 3 (three) times daily.   sodium bicarbonate 650 MG tablet 1 tablet   XARELTO 2.5 MG TABS tablet  TAKE 1 TABLET BY MOUTH TWICE A DAY. TAKE WITH ASPIRIN '81MG'$  DAILY.   No current facility-administered medications for this visit. (Other)      REVIEW OF SYSTEMS: ROS   Negative for: Constitutional, Gastrointestinal, Neurological, Skin, Genitourinary, Musculoskeletal, HENT, Endocrine, Cardiovascular, Eyes, Respiratory, Psychiatric, Allergic/Imm, Heme/Lymph Last edited by Hurman Horn, MD on 04/30/2022  2:29 PM.       ALLERGIES No Known Allergies  PAST MEDICAL HISTORY Past Medical History:  Diagnosis Date   Carotid artery occlusion    Colon cancer ([place]) 10/12/2004   stage III   Diabetes mellitus    Past Surgical History:  Procedure Laterality Date   hemocolectomy  2005    FAMILY HISTORY Family History  Problem Relation Age of  Onset   Cancer Mother    Diabetes Father    Cancer Sister     SOCIAL HISTORY Social History   Tobacco Use   Smoking status: Never   Smokeless tobacco: Never  Vaping Use   Vaping Use: Never used  Substance Use Topics   Alcohol use: No   Drug use: No         OPHTHALMIC EXAM:  Base Eye Exam     Visual Acuity (ETDRS)       Right Left   Dist [place] 20/160 20/100 -1   Dist ph [place] 20/125 -1 NI         Tonometry (Tonopen, 1:22 PM)       Right Left   Pressure 11 9         Pupils       Pupils Dark Light Shape React APD   Right PERRL 2.5 2 Round Minimal None   Left PERRL 2.5 2 Round Minimal None         Extraocular Movement       Right Left    Full Full         Neuro/Psych     Oriented x3: Yes   Mood/Affect: Normal         Dilation     Both eyes: 1.0% Mydriacyl, 2.5% Phenylephrine @ 1:22 PM           Slit Lamp and Fundus Exam     External Exam       Right Left   External Normal Normal         Slit Lamp Exam       Right Left   Lids/Lashes Normal Normal   Conjunctiva/Sclera White and quiet White and quiet   Cornea Clear Clear   Anterior Chamber Deep and quiet Deep and quiet   Iris Round and reactive Round and reactive   Anterior Vitreous Normal Normal         Fundus Exam       Right Left   Posterior Vitreous Clear, vitrectomized Clear, vitrectomized   Disc Normal Normal   C/D Ratio 0.4 0.4   Macula Good focal laser photocoagulation, geographic atrophy encroaching upon the FAZ, some lesion creep into the fovea Good focal laser photocoagulation, geographic atrophy encroaching upon the FAZ, some lesion creep into the fovea   Vessels PDR-quiet PDR-quiet   Periphery PRP 360, quiescent NVE PRP 360, quiescent NVE            IMAGING AND PROCEDURES  Imaging and Procedures for 04/30/22  OCT, Retina - OU - Both Eyes       Right Eye Quality was good. Scan locations included subfoveal. Central Foveal Thickness: 196.  Progression has been stable.  Findings include abnormal foveal contour.   Left Eye Quality was good. Scan locations included subfoveal. Progression has been stable. Findings include abnormal foveal contour.   Notes Geographic atrophy centrally, counts are acuity no active disease no active CSME     Color Fundus Photography Optos - OU - Both Eyes       Right Eye Progression has been stable. Disc findings include increased cup to disc ratio, pallor. Macula : geographic atrophy, retinal pigment epithelium abnormalities.   Left Eye Progression has been stable. Macula : geographic atrophy, retinal pigment epithelium abnormalities.   Notes OD, good PRP, good focal laser treatment temporal to the macula with lesion creep, also some areas of geographic atrophy from dry ARMD.  No active neovascularization  OS with somewhat hazy media however this is not confirmed on clinical examination.,  Good PRP, lesion creep and geographic atrophy may explain visual acuity change noted symptomatically by the patient.  PDR remains quiet in the media overall clear             ASSESSMENT/PLAN:  Controlled diabetes mellitus with stable proliferative retinopathy of both eyes ([place]) The nature of regressed proliferative diabetic retinopathy was discussed with the patient. The patient was advised to maintain good glucose, blood pressure, monitor kidney function and serum lipid control as advised by personal physician. Rare risk for reactivation of progression exist with untreated severe anemia, untreated renal failure, untreated heart failure, and smoking. Complete avoidance of smoking was recommended. The chance of recurrent proliferative diabetic retinopathy was discussed as well as the chance of vitreous hemorrhage for which further treatments may be necessary.   Explained to the patient that the quiescent  proliferative diabetic retinopathy disease is unlikely to ever worsen.  Worsening factors would include  however severe anemia, hypertension out-of-control or impending renal failure.  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 Controlled type 2 diabetes mellitus with stable proliferative retinopathy of both eyes, with long-term current use of insulin (HCC)  E11.3553 OCT, Retina - OU - Both Eyes   Z79.4 Color Fundus Photography Optos - OU - Both Eyes    2. 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 degeneration of both eyes with subfoveal involvement  H35.3134 OCT, Retina - OU - Both Eyes    Color Fundus Photography Optos - OU - Both Eyes    3. History of vitrectomy  Z98.890       1.  OU with quiescent PDR.  Stable.  2.  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 Zadie Rhine  3.  Stable acuity since that time  4.  Geographic atrophy from age-related changes and lesion creep of prior peripheral PRP  Ophthalmic Meds Ordered this visit:  No orders of the defined types were placed in this encounter.      Return in about 10 months (around 03/01/2023) for DILATE OU, COLOR FP, OCT.  There are no Patient Instructions on file for this visit.   Explained the diagnoses, plan, and follow up with the patient and they expressed understanding.  Patient expressed understanding of the importance of proper follow up care.   Clent Demark Braylin Xu M.D. Diseases & Surgery of the Retina and Vitreous Retina & Diabetic [place] 04/30/22     Abbreviations: M myopia (nearsighted); A astigmatism; H hyperopia (farsighted); P presbyopia; Mrx spectacle prescription;  CTL contact  lenses; OD right eye; OS left eye; OU both eyes  XT exotropia; ET esotropia; PEK punctate epithelial keratitis; PEE punctate epithelial erosions; DES dry eye syndrome; MGD meibomian gland dysfunction; ATs artificial  tears; PFAT's preservative free artificial tears; [place] nuclear sclerotic cataract; PSC posterior subcapsular cataract; ERM epi-retinal membrane; PVD posterior vitreous detachment; RD retinal detachment; DM diabetes mellitus; DR diabetic retinopathy; NPDR non-proliferative diabetic retinopathy; PDR proliferative diabetic retinopathy; CSME clinically significant macular edema; DME diabetic macular edema; dbh dot blot hemorrhages; CWS cotton wool spot; POAG primary open angle glaucoma; C/D cup-to-disc ratio; HVF humphrey visual field; GVF goldmann visual field; OCT optical coherence tomography; IOP intraocular pressure; BRVO Branch retinal vein occlusion; CRVO central retinal vein occlusion; CRAO central retinal artery occlusion; BRAO branch retinal artery occlusion; RT retinal tear; SB scleral buckle; PPV pars plana vitrectomy; VH Vitreous hemorrhage; PRP panretinal laser photocoagulation; IVK intravitreal kenalog; VMT vitreomacular traction; MH Macular hole;  NVD neovascularization of the disc; NVE neovascularization elsewhere; AREDS age related eye disease study; ARMD age related macular degeneration; POAG primary open angle glaucoma; EBMD epithelial/anterior basement membrane dystrophy; ACIOL anterior chamber intraocular lens; IOL intraocular lens; PCIOL posterior chamber intraocular lens; Phaco/IOL phacoemulsification with intraocular lens placement; [place] photorefractive keratectomy; LASIK laser assisted in situ keratomileusis; HTN hypertension; DM diabetes mellitus; COPD chronic obstructive pulmonary disease

## 2022-04-30 NOTE — Assessment & Plan Note (Signed)
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

## 2022-04-30 NOTE — Assessment & Plan Note (Signed)
Doing well, vitrectomized eyes

## 2022-04-30 NOTE — Assessment & Plan Note (Signed)

## 2022-05-07 DIAGNOSIS — I739 Peripheral vascular disease, unspecified: Secondary | ICD-10-CM | POA: Diagnosis not present

## 2022-05-07 DIAGNOSIS — D692 Other nonthrombocytopenic purpura: Secondary | ICD-10-CM | POA: Diagnosis not present

## 2022-05-07 DIAGNOSIS — R413 Other amnesia: Secondary | ICD-10-CM | POA: Diagnosis not present

## 2022-05-07 DIAGNOSIS — I779 Disorder of arteries and arterioles, unspecified: Secondary | ICD-10-CM | POA: Diagnosis not present

## 2022-05-07 DIAGNOSIS — Z0000 Encounter for general adult medical examination without abnormal findings: Secondary | ICD-10-CM | POA: Diagnosis not present

## 2022-05-07 DIAGNOSIS — N184 Chronic kidney disease, stage 4 (severe): Secondary | ICD-10-CM | POA: Diagnosis not present

## 2022-05-07 DIAGNOSIS — D6869 Other thrombophilia: Secondary | ICD-10-CM | POA: Diagnosis not present

## 2022-05-07 DIAGNOSIS — E1121 Type 2 diabetes mellitus with diabetic nephropathy: Secondary | ICD-10-CM | POA: Diagnosis not present

## 2022-05-07 DIAGNOSIS — E782 Mixed hyperlipidemia: Secondary | ICD-10-CM | POA: Diagnosis not present

## 2022-05-09 ENCOUNTER — Encounter: Payer: Self-pay | Admitting: Physician Assistant

## 2022-05-14 DIAGNOSIS — E1165 Type 2 diabetes mellitus with hyperglycemia: Secondary | ICD-10-CM | POA: Diagnosis not present

## 2022-05-21 ENCOUNTER — Encounter: Payer: Self-pay | Admitting: Physician Assistant

## 2022-05-21 ENCOUNTER — Ambulatory Visit: Payer: BC Managed Care – PPO | Admitting: Physician Assistant

## 2022-05-21 DIAGNOSIS — Z029 Encounter for administrative examinations, unspecified: Secondary | ICD-10-CM

## 2022-05-23 ENCOUNTER — Ambulatory Visit (INDEPENDENT_AMBULATORY_CARE_PROVIDER_SITE_OTHER): Payer: Self-pay | Admitting: Podiatry

## 2022-05-23 DIAGNOSIS — Z91199 Patient's noncompliance with other medical treatment and regimen due to unspecified reason: Secondary | ICD-10-CM

## 2022-05-23 NOTE — Progress Notes (Signed)
   Complete physical exam  Patient: Jonathan Baird   DOB: 09/01/1997   88 y.o. Male  MRN: [id number]  Subjective:    No chief complaint on file.   Jonathan Baird is a 88 y.o. male who presents today for a complete physical exam. She reports consuming a {diet types:17450} diet. {types:19826} She generally feels {DESC; WELL/FAIRLY WELL/POORLY:18703}. She reports sleeping {DESC; WELL/FAIRLY WELL/POORLY:18703}. She {does/does not:200015} have additional problems to discuss today.    Most recent fall risk assessment:    05/09/2022   10:42 AM  Fall Risk   Falls in the past year? 0  Number falls in past yr: 0  Injury with Fall? 0  Risk for fall due to : No Fall Risks  Follow up Falls evaluation completed     Most recent depression screenings:    05/09/2022   10:42 AM 03/30/2021   10:46 AM  PHQ 2/9 Scores  PHQ - 2 Score 0 0  PHQ- 9 Score 5     {VISON DENTAL STD PSA (Optional):27386}  {History (Optional):23778}  Patient Care Team: Jessup, Joy, NP as PCP - General (Nurse Practitioner)   Outpatient Medications Prior to Visit  Medication Sig   fluticasone (FLONASE) 50 MCG/ACT nasal spray Place 2 sprays into both nostrils in the morning and at bedtime. After 7 days, reduce to once daily.   norgestimate-ethinyl estradiol (SPRINTEC 28) 0.25-35 MG-MCG tablet Take 1 tablet by mouth daily.   Nystatin POWD Apply liberally to affected area 2 times per day   spironolactone (ALDACTONE) 100 MG tablet Take 1 tablet (100 mg total) by mouth daily.   No facility-administered medications prior to visit.    ROS        Objective:     There were no vitals taken for this visit. {Vitals History (Optional):23777}  Physical Exam   No results found for any visits on 06/14/22. {Show previous labs (optional):23779}    Assessment & Plan:    Routine Health Maintenance and Physical Exam  Immunization History  Administered Date(s) Administered   DTaP 11/15/1999, 01/11/2000,  03/21/2000, 12/05/2000, 06/20/2004   Hepatitis A 04/16/2008, 04/22/2009   Hepatitis B 09/02/1999, 10/10/1999, 03/21/2000   HiB (PRP-OMP) 11/15/1999, 01/11/2000, 03/21/2000, 12/05/2000   IPV 11/15/1999, 01/11/2000, 09/09/2000, 06/20/2004   Influenza,inj,Quad PF,6+ Mos 07/23/2014   Influenza-Unspecified 10/22/2012   MMR 09/09/2001, 06/20/2004   Meningococcal Polysaccharide 04/21/2012   Pneumococcal Conjugate-13 12/05/2000   Pneumococcal-Unspecified 03/21/2000, 06/04/2000   Tdap 04/21/2012   Varicella 09/09/2000, 04/16/2008    Health Maintenance  Topic Date Due   HIV Screening  Never done   Hepatitis C Screening  Never done   INFLUENZA VACCINE  06/12/2022   PAP-Cervical Cytology Screening  06/14/2022 (Originally 08/31/2020)   PAP SMEAR-Modifier  06/14/2022 (Originally 08/31/2020)   TETANUS/TDAP  06/14/2022 (Originally 04/21/2022)   HPV VACCINES  Discontinued   COVID-19 Vaccine  Discontinued    Discussed health benefits of physical activity, and encouraged her to engage in regular exercise appropriate for her age and condition.  Problem List Items Addressed This Visit   None Visit Diagnoses     Annual physical exam    -  Primary   Cervical cancer screening       Need for Tdap vaccination          No follow-ups on file.     Joy Jessup, NP   

## 2022-06-06 DIAGNOSIS — E119 Type 2 diabetes mellitus without complications: Secondary | ICD-10-CM | POA: Diagnosis not present

## 2022-06-06 DIAGNOSIS — H547 Unspecified visual loss: Secondary | ICD-10-CM | POA: Diagnosis not present

## 2022-06-06 DIAGNOSIS — N529 Male erectile dysfunction, unspecified: Secondary | ICD-10-CM | POA: Diagnosis not present

## 2022-06-06 DIAGNOSIS — N2581 Secondary hyperparathyroidism of renal origin: Secondary | ICD-10-CM | POA: Diagnosis not present

## 2022-06-06 DIAGNOSIS — E785 Hyperlipidemia, unspecified: Secondary | ICD-10-CM | POA: Diagnosis not present

## 2022-06-06 DIAGNOSIS — I10 Essential (primary) hypertension: Secondary | ICD-10-CM | POA: Diagnosis not present

## 2022-06-06 DIAGNOSIS — K219 Gastro-esophageal reflux disease without esophagitis: Secondary | ICD-10-CM | POA: Diagnosis not present

## 2022-06-06 DIAGNOSIS — I2510 Atherosclerotic heart disease of native coronary artery without angina pectoris: Secondary | ICD-10-CM | POA: Diagnosis not present

## 2022-06-06 DIAGNOSIS — Z794 Long term (current) use of insulin: Secondary | ICD-10-CM | POA: Diagnosis not present

## 2022-06-13 ENCOUNTER — Encounter: Payer: Self-pay | Admitting: Physician Assistant

## 2022-06-14 DIAGNOSIS — E1165 Type 2 diabetes mellitus with hyperglycemia: Secondary | ICD-10-CM | POA: Diagnosis not present

## 2022-06-25 ENCOUNTER — Ambulatory Visit: Payer: Medicare PPO | Admitting: Physician Assistant

## 2022-06-25 ENCOUNTER — Other Ambulatory Visit (INDEPENDENT_AMBULATORY_CARE_PROVIDER_SITE_OTHER): Payer: Medicare PPO

## 2022-06-25 ENCOUNTER — Encounter: Payer: Self-pay | Admitting: Physician Assistant

## 2022-06-25 VITALS — BP 144/77 | HR 74 | Resp 20 | Ht 71.0 in

## 2022-06-25 DIAGNOSIS — F01A0 Vascular dementia, mild, without behavioral disturbance, psychotic disturbance, mood disturbance, and anxiety: Secondary | ICD-10-CM | POA: Diagnosis not present

## 2022-06-25 DIAGNOSIS — R413 Other amnesia: Secondary | ICD-10-CM

## 2022-06-25 NOTE — Progress Notes (Unsigned)
Assessment/Plan:    The patient is seen in neurologic consultation at the request of Merrilee Seashore, MD for the evaluation of memory.  Jonathan Baird is a very pleasant 88 y.o. year old RH male with  a history of hypertension, hyperlipidemia, DM2, diabetic retinopathy, CKD, peripheral vascular disease, CAD, recent presentation to the ER with confusion on 04/26/2022, seen today for evaluation of memory loss. MoCA today is  Recommendations:   Memory Loss   Check B12, TSH Folllow up once results above are available   Subjective:    The patient is accompanied by  who supplements the history.    How long did patient have memory difficulties? "Now". 2 months ago, was really confused. He believes is because of the medications( I take a lot of them)  Sits on the porch because I am losing that sight, because of my diabetes.  Patient lives with: Spouse  repeats oneself? Denies  Disoriented when walking into a room?  Patient denies   Leaving objects in unusual places?  Patient denies   Ambulates  with difficulty?   Patient denies   Recent falls?  Denies  Any head injuries?  Patient denies   History of seizures?   Patient denies   Wandering behavior?  Patient denies   Patient drives?   Patient no longer drives  Any mood changes such irritability agitation?  Patient denies   Any history of depression?:  Patient denies   Hallucinations?  Patient denies   Paranoia?  Patient denies   Patient reports that male sleeps well without vivid dreams, REM behavior or sleepwalking    History of sleep apnea?  Patient denies   Any hygiene concerns?  Patient denies   Independent of bathing and dressing?  Endorsed  Does the patient needs help with medications?  pillbox pill pack  Patient denies   Who is in charge of the finances?  Patient is in charge   Any changes in appetite?  Patient denies   Patient have trouble swallowing? Patient denies   Does the patient cook?  Patient denies   Any  kitchen accidents such as leaving the stove on? Patient denies   Any headaches?  Patient denies   Double vision? Patient denies   Any focal numbness or tingling?  Patient denies   Chronic back pain Patient denies   Unilateral weakness?  Patient denies   Any tremors?  Patient denies   Any history of anosmia?  Patient denies   Any incontinence of urine? Incontinence  Any bowel dysfunction?   Patient denies   History of heavy alcohol intake?  Patient denies   History of heavy tobacco use?  Patient denies   Family history of dementia?  Patient denies    Mother  Father  Sister   Brother   Grandfather  Grandmother  Alzheimer's disease     CT head: IMPRESSION:  1. Prominent ventricles, which could be the sequela of central  volume loss, but there is also the suggestion of crowding of the  sulci at the vertex and acute callosal angle, which can be seen in  the setting of normal pressure hydrocephalus. Correlate with  symptoms and consider CSF tap test, if clinically indicated.  2. No additional acute intracranial process.  CXR: IMPRESSION:  No active cardiopulmonary disease.  MRI: IMPRESSION:  1. 9 mm focus of mild diffusion abnormality involving the  subcortical left frontal lobe, with additional 4 mm focus of mild  diffusion abnormality at the right frontal  corona radiata. Findings  likely reflect small evolving subacute small vessel type infarcts.  No associated hemorrhage or mass effect.  2. Diffuse ventricular prominence somewhat out of proportion to  cortical sulcation. While this finding may be related to underlying  atrophy, a degree of NPH could be contributory, and could be  considered in the correct clinical setting.  3. Underlying age-related cerebral atrophy with moderate chronic  microvascular ischemic disease.     No Known Allergies  Current Outpatient Medications  Medication Instructions   amLODipine (NORVASC) 10 MG tablet 1 tablet   amLODipine (NORVASC) 2.5 mg,  Daily   amLODipine (NORVASC) 10 mg, Oral, Daily   aspirin (ASPIRIN 81) 81 MG chewable tablet 1 tablet   B-D ULTRAFINE III SHORT PEN 31G X 8 MM MISC Subcutaneous, 2 times daily   calcitRIOL (ROCALTROL) 0.25 MCG capsule No dose, route, or frequency recorded.   Continuous Blood Gluc Sensor (FREESTYLE LIBRE 2 SENSOR) MISC See admin instructions   Dulaglutide (TRULICITY) 1.5 MW/4.1LK SOPN 1.5 mg   Dulaglutide (TRULICITY) 1.5 GM/0.1UU SOPN Inject 1 pen (1.5 mg) into skin weekly 28 days   Dulaglutide (TRULICITY) 3 VO/5.3GU SOPN Inject 3 mg into the skin once weekly 28 days   glipiZIDE (GLUCOTROL) 5 mg, 2 times daily before meals   GVOKE HYPOPEN 2-PACK 1 MG/0.2ML SOAJ Subcutaneous   hydrALAZINE (APRESOLINE) 25 MG tablet TAKE 1 TABLET BY MOUTH THREE TIMES A DAY WITH FOOD FOR 30 DAYS 90   hydrALAZINE (APRESOLINE) 50 MG tablet 1 tablet   hydrochlorothiazide (MICROZIDE) 2.5 mg, Daily   insulin aspart (NOVOLOG FLEXPEN) 100 UNIT/ML FlexPen 3 units   Insulin Glargine (BASAGLAR KWIKPEN) 100 UNIT/ML 6 units   insulin lispro (HUMALOG KWIKPEN) 100 UNIT/ML KwikPen See admin instructions   JANUVIA 25 MG tablet No dose, route, or frequency recorded.   lisinopril (ZESTRIL) 40 MG tablet 1 tablet   lisinopril (ZESTRIL) 40 mg, Daily   pravastatin (PRAVACHOL) 70 mg, Daily   saxagliptin HCl (ONGLYZA) 5 MG TABS tablet Daily   sodium bicarbonate 650 MG tablet 1 tablet   sodium bicarbonate 650 mg, Oral, 3 times daily   XARELTO 2.5 MG TABS tablet TAKE 1 TABLET BY MOUTH TWICE A DAY. TAKE WITH ASPIRIN '81MG'$  DAILY.     VITALS:  There were no vitals filed for this visit.     No data to display          PHYSICAL EXAM   HEENT:  Normocephalic, atraumatic. The mucous membranes are moist. The superficial temporal arteries are without ropiness or tenderness. Cardiovascular: Regular rate and rhythm. Lungs: Clear to auscultation bilaterally. Neck: There are no carotid bruits noted bilaterally.  NEUROLOGICAL:     No  data to display              No data to display           Orientation:  Alert and oriented to person, place and time. No aphasia or dysarthria. Fund of knowledge is appropriate. Recent memory impaired and remote memory intact.  Attention and concentration are normal.  Able to name objects and repeat phrases. Delayed recall   Cranial nerves: There is good facial symmetry. Extraocular muscles are intact and visual fields are full to confrontational testing. Speech is fluent and clear. Soft palate rises symmetrically and there is no tongue deviation. Hearing is intact to conversational tone. Tone: Tone is good throughout. Sensation: Sensation is intact to light touch and pinprick throughout. Vibration is intact at the bilateral big toe.There  is no extinction with double simultaneous stimulation. There is no sensory dermatomal level identified. Coordination: The patient has no difficulty with RAM's or FNF bilaterally. Normal finger to nose  Motor: Strength is 5/5 in the bilateral upper and lower extremities. There is no pronator drift. There are no fasciculations noted. DTR's: Deep tendon reflexes are 2/4 at the bilateral biceps, triceps, brachioradialis, patella and achilles.  Plantar responses are downgoing bilaterally. Gait and Station: The patient is able to ambulate without difficulty.The patient is able to heel toe walk without any difficulty.The patient is able to ambulate in a tandem fashion. The patient is able to stand in the Romberg position.     Thank you for allowing [place] the opportunity to participate in the care of this nice patient. Please do not hesitate to contact us for any questions or concerns.   Total time spent on today's visit was *** minutes dedicated to this patient today, preparing to see patient, examining the patient, ordering tests and/or medications and counseling the patient, documenting clinical information in the EHR or other health record, independently  interpreting results and communicating results to the patient/family, discussing treatment and goals, answering patient's questions and coordinating care.  Cc:  Merrilee Seashore, MD  Sharene Butters 06/25/2022 8:02 AM

## 2022-06-25 NOTE — Patient Instructions (Addendum)
It was a pleasure to see you today at our office.   Recommendations:  Make sure you talk with your doctor about your medicine list  Check labs today Follow up in 2 months Drink plenty water   Whom to call:  Memory  decline, memory medications: Call our office [phone]   For psychiatric meds, mood meds: Please have your primary care physician manage these medications.   Counseling regarding caregiver distress, including caregiver depression, anxiety and issues regarding community resources, adult day care programs, adult living facilities, or memory care questions:   Feel free to contact Se[place], Social Worker at [phone]   For assessment of decision of mental capacity and competency:  Call Dr. Anthoney Harada, geriatric psychiatrist at [phone]  For guidance in geriatric dementia issues please call [facility] [phone]   If you have any severe symptoms of a stroke, or other severe issues such as confusion,severe chills or fever, etc call 911 or go to the ER as you may need to be evaluated further       RECOMMENDATIONS FOR ALL PATIENTS WITH MEMORY PROBLEMS: 1. Continue to exercise (Recommend 30 minutes of walking everyday, or 3 hours every week) 2. Increase social interactions - continue going to [place] and enjoy social gatherings with friends and family 3. Eat healthy, avoid fried foods and eat more fruits and vegetables 4. Maintain adequate blood pressure, blood sugar, and blood cholesterol level. Reducing the risk of stroke and cardiovascular disease also helps promoting better memory. 5. Avoid stressful situations. Live a simple life and avoid aggravations. Organize your time and prepare for the next day in anticipation. 6. Sleep well, avoid any interruptions of sleep and avoid any distractions in the bedroom that may interfere with adequate sleep quality 7. Avoid sugar, avoid sweets as there is a strong link between excessive sugar  intake, diabetes, and cognitive impairment We discussed the Mediterranean diet, which has been shown to help patients reduce the risk of progressive memory disorders and reduces cardiovascular risk. This includes eating fish, eat fruits and green leafy vegetables, nuts like almonds and hazelnuts, walnuts, and also use olive oil. Avoid fast foods and fried foods as much as possible. Avoid sweets and sugar as sugar use has been linked to worsening of memory function.  There is always a concern of gradual progression of memory problems. If this is the case, then we may need to adjust level of care according to patient needs. Support, both to the patient and caregiver, should then be put into place.      FALL PRECAUTIONS: Be cautious when walking. Scan the area for obstacles that may increase the risk of trips and falls. When getting up in the mornings, sit up at the edge of the bed for a few minutes before getting out of bed. Consider elevating the bed at the head end to avoid drop of blood pressure when getting up. Walk always in a well-lit room (use night lights in the walls). Avoid area rugs or power cords from appliances in the middle of the walkways. Use a walker or a cane if necessary and consider physical therapy for balance exercise. Get your eyesight checked regularly.  FINANCIAL OVERSIGHT: Supervision, especially oversight when making financial decisions or transactions is also recommended.  HOME SAFETY: Consider the safety of the kitchen when operating appliances like stoves, microwave oven, and blender. Consider having supervision and share cooking responsibilities until no longer able to participate in those. Accidents with firearms and other  hazards in the house should be identified and addressed as well.   ABILITY TO BE LEFT ALONE: If patient is unable to contact 911 operator, consider using LifeLine, or when the need is there, arrange for someone to stay with patients. Smoking is a fire  hazard, consider supervision or cessation. Risk of wandering should be assessed by caregiver and if detected at any point, supervision and safe proof recommendations should be instituted.  MEDICATION SUPERVISION: Inability to self-administer medication needs to be constantly addressed. Implement a mechanism to ensure safe administration of the medications.   DRIVING: Regarding driving, in patients with progressive memory problems, driving will be impaired. We advise to have someone else do the driving if trouble finding directions or if minor accidents are reported. Independent driving assessment is available to determine safety of driving.   If you are interested in the driving assessment, you can contact the following:  [employer] in [place]  [place] [phone] or [phone]    [place] refers to food and lifestyle choices that are based on the traditions of countries located on the [employer]. This way of eating has been shown to help prevent certain conditions and improve outcomes for people who have chronic diseases, like kidney disease and heart disease. What are tips for following this plan? Lifestyle  Cook and eat meals together with your family, when possible. Drink enough fluid to keep your urine clear or pale yellow. Be physically active every day. This includes: Aerobic exercise like running or swimming. Leisure activities like gardening, walking, or housework. Get 7-8 hours of sleep each night. If recommended by your health care provider, drink red wine in moderation. This means 1 glass a day for nonpregnant women and 2 glasses a day for men. A glass of wine equals 5 oz (150 mL). Reading food labels  Check the serving size of packaged foods. For foods such as rice and pasta, the serving size refers to the amount  of cooked product, not dry. Check the total fat in packaged foods. Avoid foods that have saturated fat or trans fats. Check the ingredients list for added sugars, such as corn syrup. Shopping  At the grocery store, buy most of your food from the areas near the walls of the store. This includes: Fresh fruits and vegetables (produce). Grains, beans, nuts, and seeds. Some of these may be available in unpackaged forms or large amounts (in bulk). Fresh seafood. Poultry and eggs. Low-fat dairy products. Buy whole ingredients instead of prepackaged foods. Buy fresh fruits and vegetables in-season from local farmers markets. Buy frozen fruits and vegetables in resealable bags. If you do not have access to quality fresh seafood, buy precooked frozen shrimp or canned fish, such as tuna, salmon, or sardines. Buy small amounts of raw or cooked vegetables, salads, or olives from the deli or salad bar at your store. Stock your pantry so you always have certain foods on hand, such as olive oil, canned tuna, canned tomatoes, rice, pasta, and beans. Cooking  Cook foods with extra-virgin olive oil instead of using butter or other vegetable oils. Have meat as a side dish, and have vegetables or grains as your main dish. This means having meat in small portions or adding small amounts of meat to foods like pasta or stew. Use beans or vegetables instead of meat in common dishes like chili or lasagna. Experiment with different cooking methods. Try  roasting or broiling vegetables instead of steaming or sauteing them. Add frozen vegetables to soups, stews, pasta, or rice. Add nuts or seeds for added healthy fat at each meal. You can add these to yogurt, salads, or vegetable dishes. Marinate fish or vegetables using olive oil, lemon juice, garlic, and fresh herbs. Meal planning  Plan to eat 1 vegetarian meal one day each week. Try to work up to 2 vegetarian meals, if possible. Eat seafood 2 or more times a  week. Have healthy snacks readily available, such as: Vegetable sticks with hummus. Greek yogurt. Fruit and nut trail mix. Eat balanced meals throughout the week. This includes: Fruit: 2-3 servings a day Vegetables: 4-5 servings a day Low-fat dairy: 2 servings a day Fish, poultry, or lean meat: 1 serving a day Beans and legumes: 2 or more servings a week Nuts and seeds: 1-2 servings a day Whole grains: 6-8 servings a day Extra-virgin olive oil: 3-4 servings a day Limit red meat and sweets to only a few servings a month What are my food choices? Mediterranean diet Recommended Grains: Whole-grain pasta. Brown rice. Bulgar wheat. Polenta. Couscous. Whole-wheat bread. [place]. Vegetables: Artichokes. Beets. Broccoli. Cabbage. Carrots. Eggplant. Green beans. Chard. Kale. Spinach. Onions. Leeks. Peas. Squash. Tomatoes. Peppers. Radishes. Fruits: Apples. Apricots. Avocado. Berries. Bananas. Cherries. Dates. Figs. Grapes. Lemons. Melon. Oranges. Peaches. Plums. Pomegranate. Meats and other protein foods: Beans. Almonds. Sunflower seeds. Pine nuts. Peanuts. [place]. Salmon. Scallops. Shrimp. [place]. Tilapia. Clams. Oysters. Eggs. Dairy: Low-fat milk. Cheese. Greek yogurt. Beverages: Water. Red wine. Herbal tea. Fats and oils: Extra virgin olive oil. Avocado oil. Grape seed oil. Sweets and desserts: [place] yogurt with honey. Baked apples. Poached pears. Trail mix. Seasoning and other foods: Basil. Cilantro. Coriander. Cumin. Mint. Parsley. Sage. Rosemary. Tarragon. Garlic. Oregano. Thyme. Pepper. Balsalmic vinegar. Tahini. Hummus. Tomato sauce. Olives. Mushrooms. Limit these Grains: Prepackaged pasta or rice dishes. Prepackaged cereal with added sugar. Vegetables: Deep fried potatoes (french fries). Fruits: Fruit canned in syrup. Meats and other protein foods: Beef. Pork. Lamb. Poultry with skin. Hot dogs. [place] Salines. Dairy: Ice cream. Sour cream. Whole milk. Beverages: Juice. Sugar-sweetened soft  drinks. Beer. Liquor and spirits. Fats and oils: Butter. Canola oil. Vegetable oil. Beef fat (tallow). Lard. Sweets and desserts: Cookies. Cakes. Pies. Candy. Seasoning and other foods: Mayonnaise. Premade sauces and marinades. The items listed may not be a complete list. Talk with your dietitian about what dietary choices are right for you. Summary The Mediterranean diet includes both food and lifestyle choices. Eat a variety of fresh fruits and vegetables, beans, nuts, seeds, and whole grains. Limit the amount of red meat and sweets that you eat. Talk with your health care provider about whether it is safe for you to drink red wine in moderation. This means 1 glass a day for nonpregnant women and 2 glasses a day for men. A glass of wine equals 5 oz (150 mL). This information is not intended to replace advice given to you by your health care provider. Make sure you discuss any questions you have with your health care provider. Document Released: 06/21/2016 Document Revised: 07/24/2016 Document Reviewed: 06/21/2016 Elsevier Interactive Patient Education  2017 [place] provider has requested that you have labwork completed today. Please go to [facility] ([id number]) on the second floor of this building before leaving the office today. You do not need to check in. If you are not called within 15 minutes please check with the front desk.

## 2022-06-26 LAB — TSH: TSH: 2.25 u[IU]/mL (ref 0.35–5.50)

## 2022-06-26 LAB — VITAMIN B12: Vitamin B-12: 232 pg/mL (ref 211–911)

## 2022-06-27 NOTE — Progress Notes (Signed)
Vitamin B12 is on the lower side at 232, recommend to take 1 tablet, 1000 mcg daily of vitamin B12.  Thyroid is normal.  Thank you

## 2022-06-28 DIAGNOSIS — I129 Hypertensive chronic kidney disease with stage 1 through stage 4 chronic kidney disease, or unspecified chronic kidney disease: Secondary | ICD-10-CM | POA: Diagnosis not present

## 2022-06-28 DIAGNOSIS — E872 Acidosis, unspecified: Secondary | ICD-10-CM | POA: Diagnosis not present

## 2022-06-28 DIAGNOSIS — E11319 Type 2 diabetes mellitus with unspecified diabetic retinopathy without macular edema: Secondary | ICD-10-CM | POA: Diagnosis not present

## 2022-06-28 DIAGNOSIS — D631 Anemia in chronic kidney disease: Secondary | ICD-10-CM | POA: Diagnosis not present

## 2022-06-28 DIAGNOSIS — N184 Chronic kidney disease, stage 4 (severe): Secondary | ICD-10-CM | POA: Diagnosis not present

## 2022-06-28 DIAGNOSIS — N2581 Secondary hyperparathyroidism of renal origin: Secondary | ICD-10-CM | POA: Diagnosis not present

## 2022-07-15 DIAGNOSIS — E1165 Type 2 diabetes mellitus with hyperglycemia: Secondary | ICD-10-CM | POA: Diagnosis not present

## 2022-08-14 DIAGNOSIS — E1165 Type 2 diabetes mellitus with hyperglycemia: Secondary | ICD-10-CM | POA: Diagnosis not present

## 2022-08-28 ENCOUNTER — Encounter: Payer: Self-pay | Admitting: Physician Assistant

## 2022-08-28 ENCOUNTER — Ambulatory Visit: Payer: Medicare PPO | Admitting: Physician Assistant

## 2022-08-28 DIAGNOSIS — Z029 Encounter for administrative examinations, unspecified: Secondary | ICD-10-CM

## 2022-09-14 DIAGNOSIS — E1165 Type 2 diabetes mellitus with hyperglycemia: Secondary | ICD-10-CM | POA: Diagnosis not present

## 2022-10-14 DIAGNOSIS — E1165 Type 2 diabetes mellitus with hyperglycemia: Secondary | ICD-10-CM | POA: Diagnosis not present

## 2022-11-28 DIAGNOSIS — N184 Chronic kidney disease, stage 4 (severe): Secondary | ICD-10-CM | POA: Diagnosis not present

## 2022-11-28 DIAGNOSIS — Z794 Long term (current) use of insulin: Secondary | ICD-10-CM | POA: Diagnosis not present

## 2022-11-28 DIAGNOSIS — I739 Peripheral vascular disease, unspecified: Secondary | ICD-10-CM | POA: Diagnosis not present

## 2022-11-28 DIAGNOSIS — I129 Hypertensive chronic kidney disease with stage 1 through stage 4 chronic kidney disease, or unspecified chronic kidney disease: Secondary | ICD-10-CM | POA: Diagnosis not present

## 2022-11-28 DIAGNOSIS — I779 Disorder of arteries and arterioles, unspecified: Secondary | ICD-10-CM | POA: Diagnosis not present

## 2022-11-28 DIAGNOSIS — E782 Mixed hyperlipidemia: Secondary | ICD-10-CM | POA: Diagnosis not present

## 2022-11-28 DIAGNOSIS — E1121 Type 2 diabetes mellitus with diabetic nephropathy: Secondary | ICD-10-CM | POA: Diagnosis not present

## 2022-12-05 DIAGNOSIS — I779 Disorder of arteries and arterioles, unspecified: Secondary | ICD-10-CM | POA: Diagnosis not present

## 2022-12-05 DIAGNOSIS — Z794 Long term (current) use of insulin: Secondary | ICD-10-CM | POA: Diagnosis not present

## 2022-12-05 DIAGNOSIS — E1121 Type 2 diabetes mellitus with diabetic nephropathy: Secondary | ICD-10-CM | POA: Diagnosis not present

## 2022-12-05 DIAGNOSIS — I739 Peripheral vascular disease, unspecified: Secondary | ICD-10-CM | POA: Diagnosis not present

## 2022-12-05 DIAGNOSIS — D692 Other nonthrombocytopenic purpura: Secondary | ICD-10-CM | POA: Diagnosis not present

## 2022-12-05 DIAGNOSIS — I129 Hypertensive chronic kidney disease with stage 1 through stage 4 chronic kidney disease, or unspecified chronic kidney disease: Secondary | ICD-10-CM | POA: Diagnosis not present

## 2022-12-05 DIAGNOSIS — D6869 Other thrombophilia: Secondary | ICD-10-CM | POA: Diagnosis not present

## 2022-12-05 DIAGNOSIS — N184 Chronic kidney disease, stage 4 (severe): Secondary | ICD-10-CM | POA: Diagnosis not present

## 2022-12-05 DIAGNOSIS — E782 Mixed hyperlipidemia: Secondary | ICD-10-CM | POA: Diagnosis not present

## 2023-03-04 ENCOUNTER — Encounter (INDEPENDENT_AMBULATORY_CARE_PROVIDER_SITE_OTHER): Payer: BC Managed Care – PPO | Admitting: Ophthalmology

## 2023-03-05 DIAGNOSIS — E1165 Type 2 diabetes mellitus with hyperglycemia: Secondary | ICD-10-CM | POA: Diagnosis not present

## 2023-03-31 IMAGING — MR MR HEAD W/O CM
6 of 10 series · 29 of 48 positions shown · non-contrast
Comparison: Prior CT from earlier the same day.

CLINICAL DATA: Initial evaluation for neuro deficit, stroke
suspected.

EXAM:
MRI HEAD WITHOUT CONTRAST
TECHNIQUE: Multiplanar, multiecho pulse sequences of the brain and surrounding
structures were obtained without intravenous contrast.

[Series 2: DWI · axial · 3.0mm · 0.94mm/px · z∈[-121,+23]mm · 9 of 100 slices shown (1 of 2)]
[im 1/100]
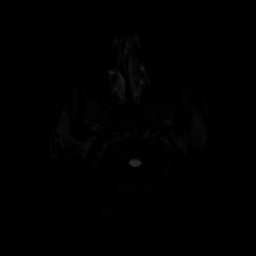
[im 13/100]
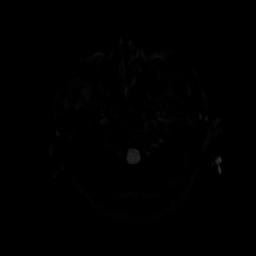
[im 25/100]
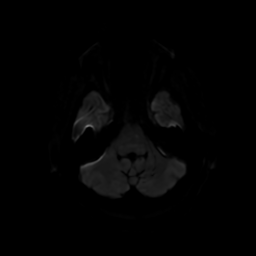
[im 38/100]
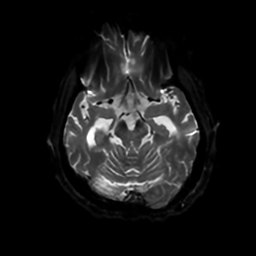
[im 50/100]
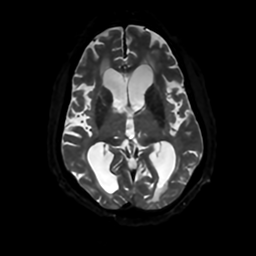
[im 62/100]
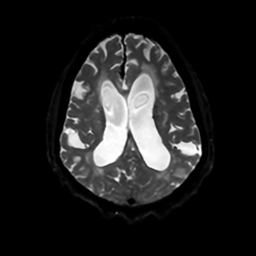
[im 75/100]
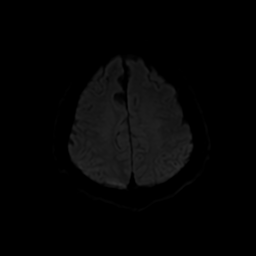
[im 87/100]
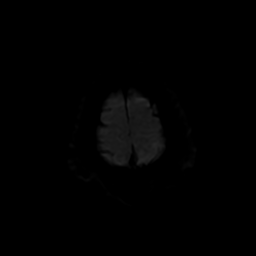
[im 100/100]
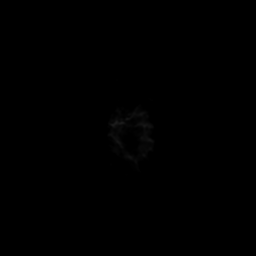

[Series 3: DWI · coronal · 4.0mm · 0.94mm/px · 7 of 74 slices shown (2 of 2)]
[im 1/74]
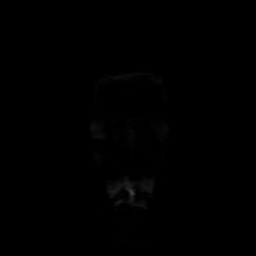
[im 13/74]
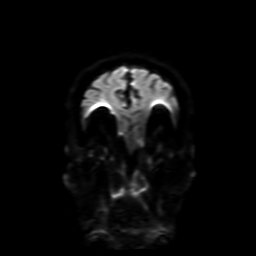
[im 25/74]
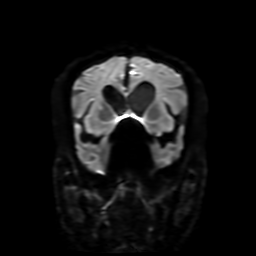
[im 37/74]
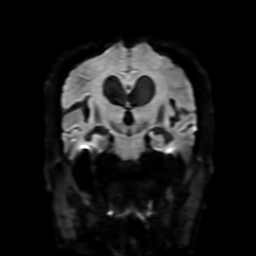
[im 49/74]
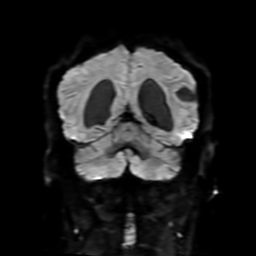
[im 61/74]
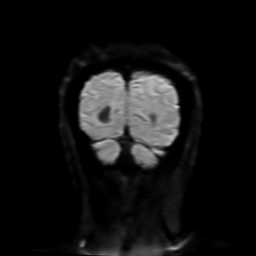
[im 74/74]
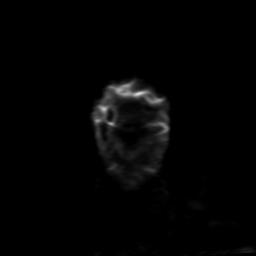

[Series 4: FLAIR · sagittal · 5.0mm · 0.23mm/px · 2 of 25 slices shown (1 of 2)]
[im 1/25]
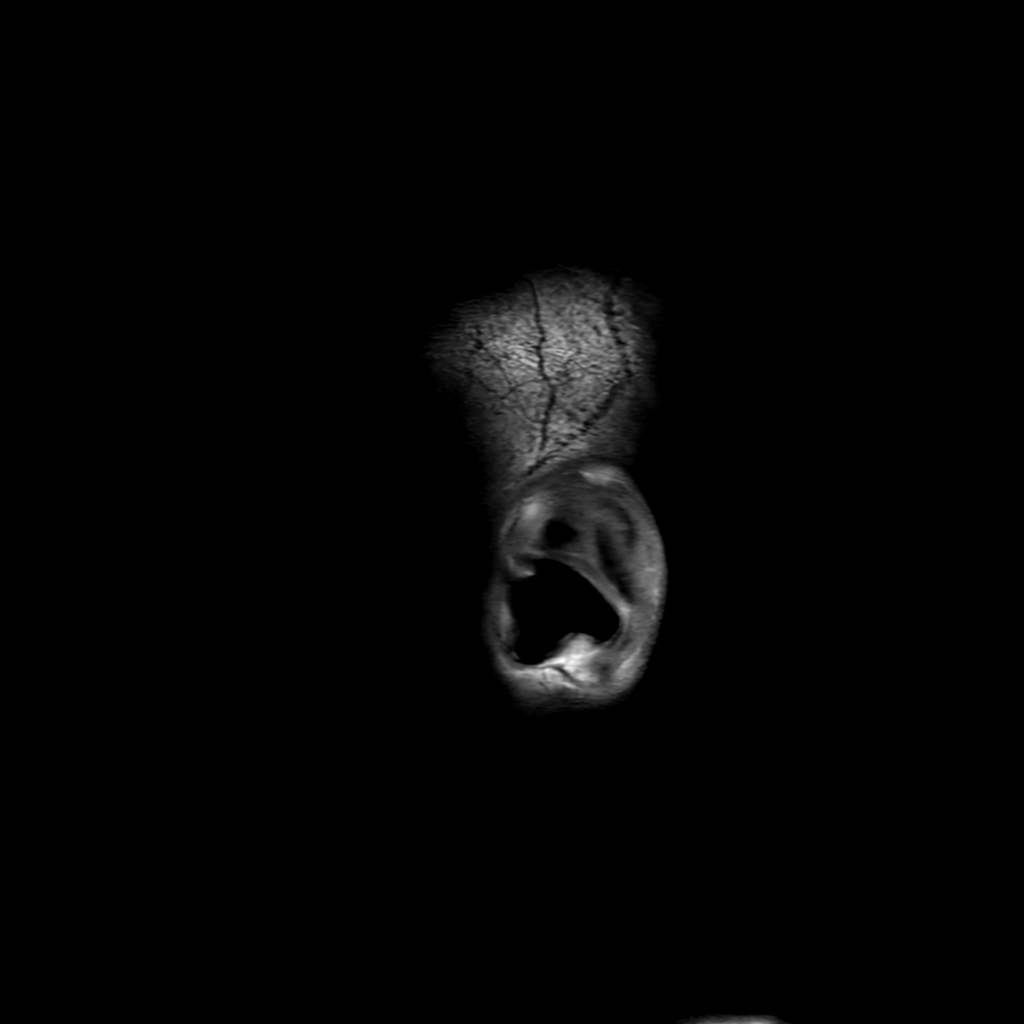
[im 25/25]
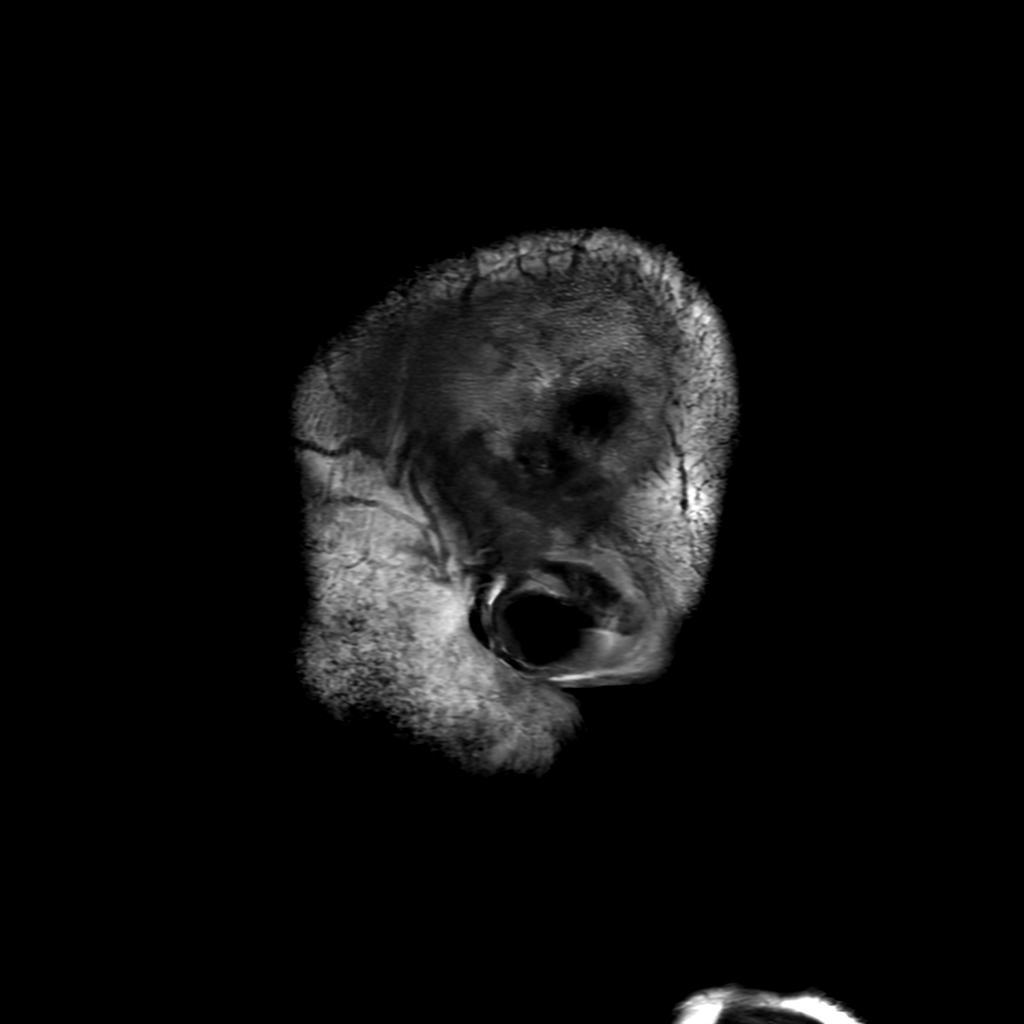

[Series 6: FLAIR · axial · 4.0mm · 0.45mm/px · z∈[-119,+23]mm · 3 of 34 slices shown (2 of 2)]
[im 1/34]
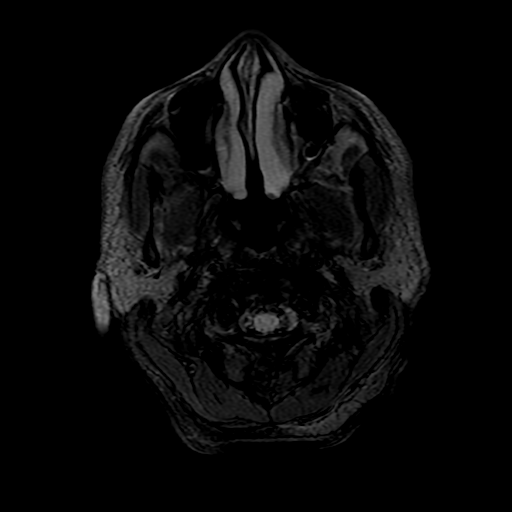
[im 17/34]
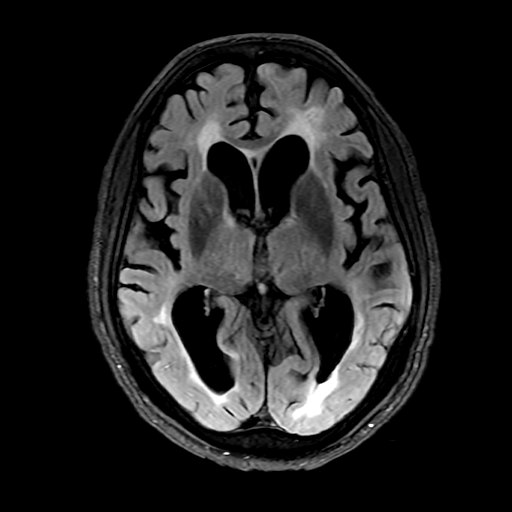
[im 34/34]
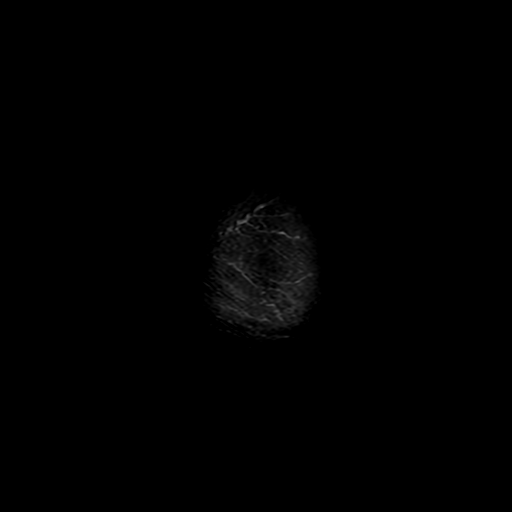

[Series 250: ADC · axial · 3.0mm · 0.94mm/px · z∈[-121,+23]mm · 5 of 50 slices shown (1 of 2)]
[im 1/50]
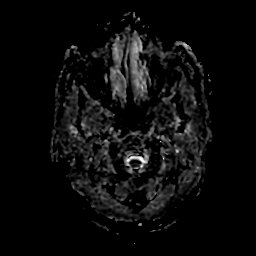
[im 13/50]
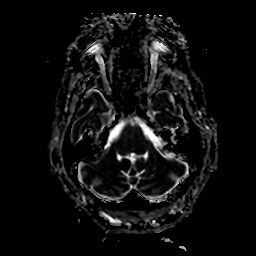
[im 25/50]
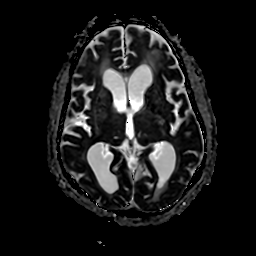
[im 37/50]
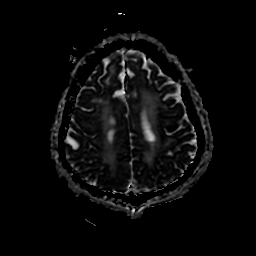
[im 50/50]
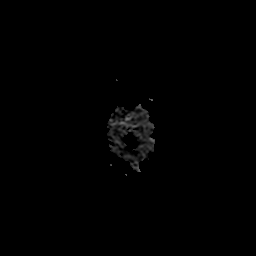

[Series 350: ADC · coronal · 4.0mm · 0.94mm/px · 3 of 37 slices shown (2 of 2)]
[im 1/37]
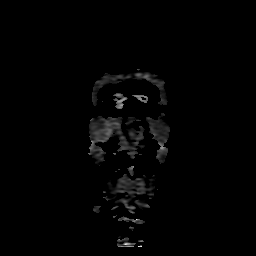
[im 19/37]
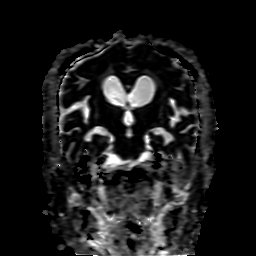
[im 37/37]
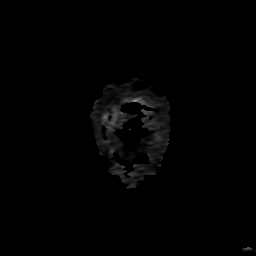

[29 of 48 positions shown; findings below may reference images not displayed]

FINDINGS: Brain: Generalized age-related cerebral atrophy. Patchy and
confluent T2/FLAIR hyperintensity seen involving the periventricular
and deep white matter both cerebral hemispheres as well as the pons,
likely enlarged part reflecting changes of chronic microvascular
ischemic disease. A degree of transependymal flow of CSF could be
contributory. Few small remote lacunar infarcts present about the
thalami bilaterally.

9 mm focus of mild diffusion abnormality seen involving the
subcortical left frontal lobe (series 2, image 37). Additional
punctate 4 mm focus of mild diffusion abnormality noted at the right
frontal corona radiata (series 2, image 36). No clear ADC correlate.
Findings likely reflect small evolving subacute small vessel type
infarcts. No associated hemorrhage or mass effect. No other evidence
for acute or subacute ischemia. Gray-white matter differentiation
otherwise maintained. No acute intracranial hemorrhage. Few
scattered chronic micro hemorrhages noted, likely hypertensive/small
vessel related.

No mass lesion or midline shift. Diffuse ventricular prominence
somewhat out of proportion to cortical sulcation. No extra-axial
fluid collection. Pituitary gland suprasellar region normal.

Vascular: Major intracranial vascular flow voids grossly maintained
at the skull base.

Skull and upper cervical spine: Cranial junction within normal
limits. Bone marrow signal intensity normal. No scalp soft tissue
abnormality.

Sinuses/Orbits: Prior bilateral ocular lens replacement. Mild
scattered mucosal thickening about the ethmoidal air cells and
maxillary sinuses. Paranasal sinuses are otherwise clear. No
significant mastoid effusion.

Other: Subcentimeter cystic focus noted within the right parotid
gland, of doubtful significance.
IMPRESSION: 1. 9 mm focus of mild diffusion abnormality involving the
subcortical left frontal lobe, with additional 4 mm focus of mild
diffusion abnormality at the right frontal corona radiata. Findings
likely reflect small evolving subacute small vessel type infarcts.
No associated hemorrhage or mass effect.
2. Diffuse ventricular prominence somewhat out of proportion to
cortical sulcation. While this finding may be related to underlying
atrophy, a degree of NPH could be contributory, and could be
considered in the correct clinical setting.
3. Underlying age-related cerebral atrophy with moderate chronic
microvascular ischemic disease.

## 2023-03-31 IMAGING — CT CT HEAD W/O CM
4 series · 16 of 47 positions shown, 18 images · non-contrast
Comparison: 05/15/2005

CLINICAL DATA: Confusion, difficulty speaking generalized weakness



[Series 3: head without · axial · non-contrast · 0.45mm/px · z∈[-92,+28]mm · 7 of 32 slices shown, 9 images]
[im 4/32  brain]
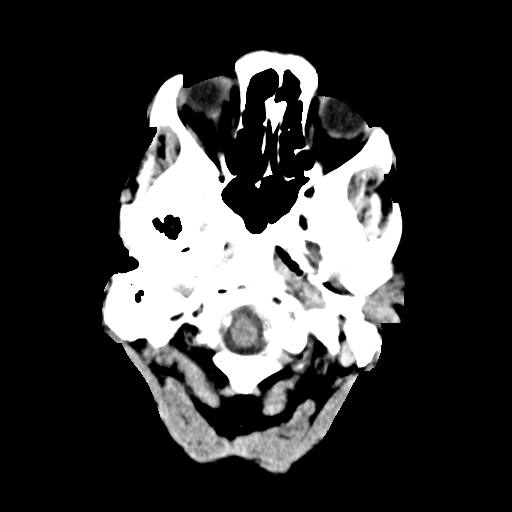
[im 4/32  bone]
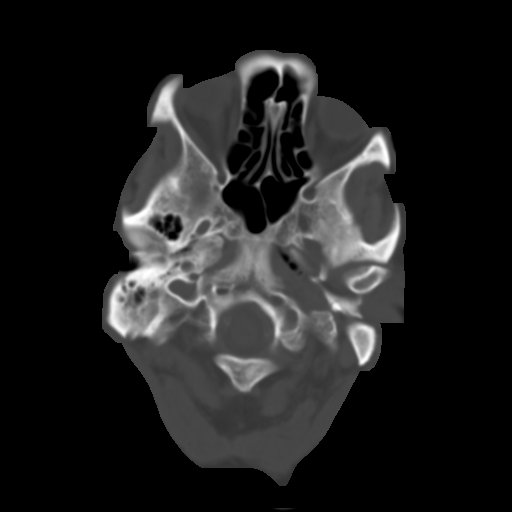
[im 8/32  brain]
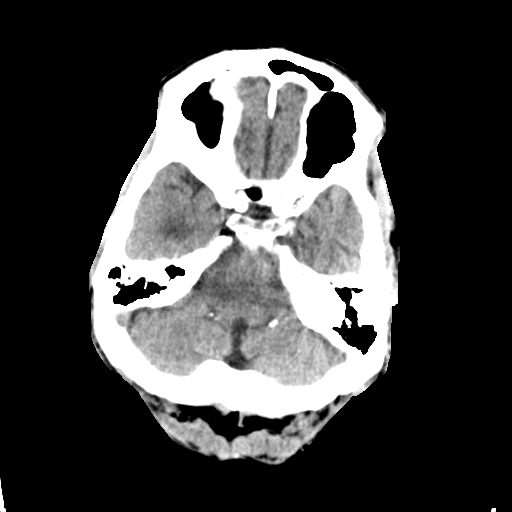
[im 12/32  brain]
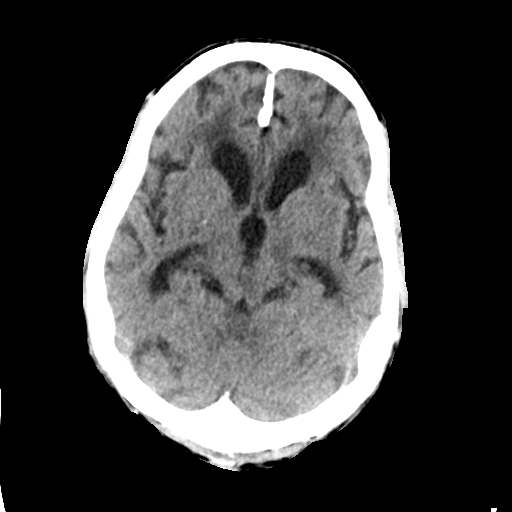
[im 16/32  brain]
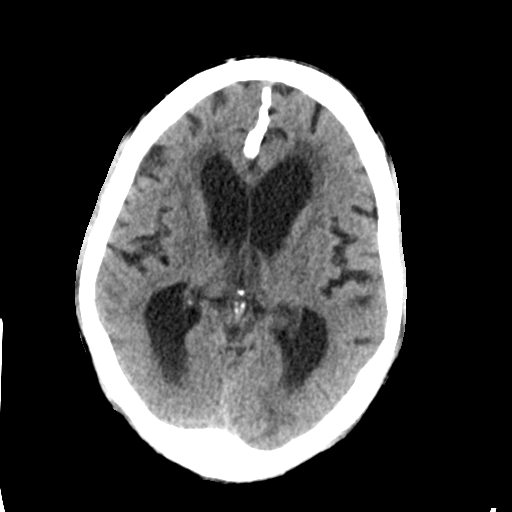
[im 20/32  brain]
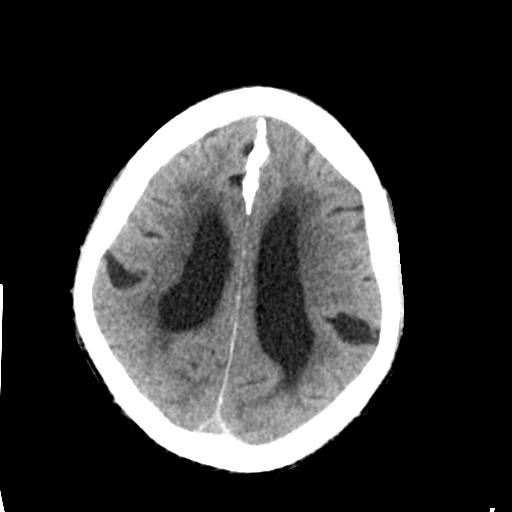
[im 20/32  bone]
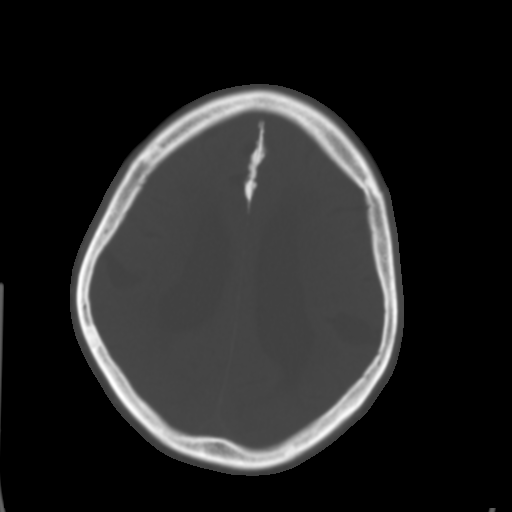
[im 24/32  brain]
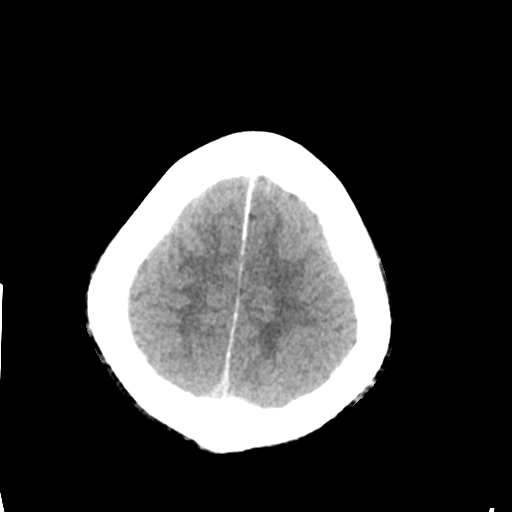
[im 28/32  brain]
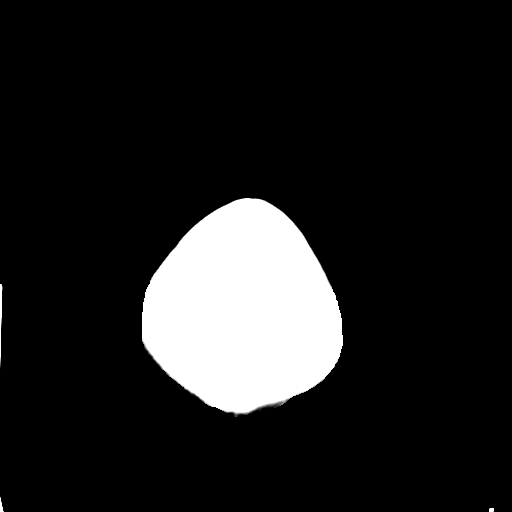

[Series 4: head bone · axial · 0.45mm/px · z∈[-93,-61]mm · 3 of 80 slices shown]
[im 8/80  bone]
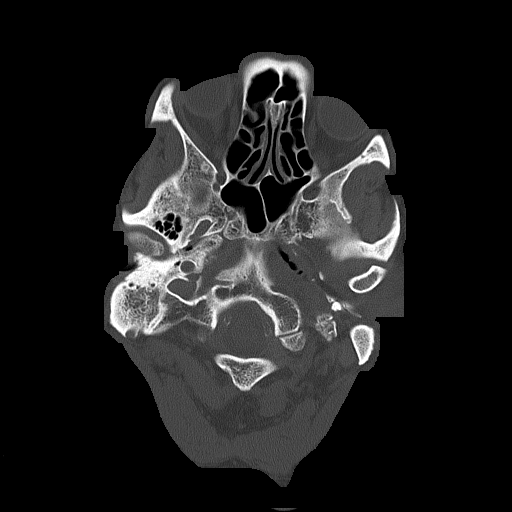
[im 16/80  bone]
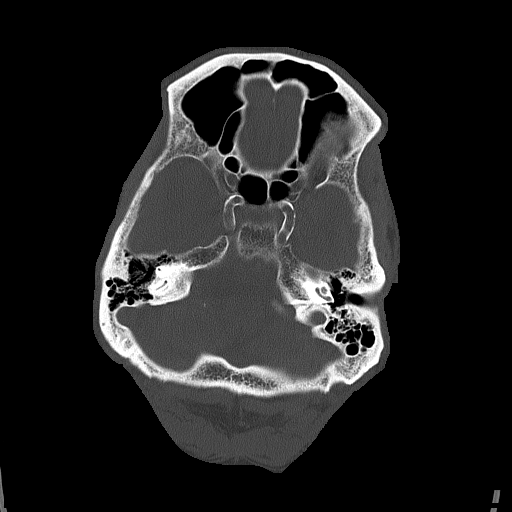
[im 24/80  bone]
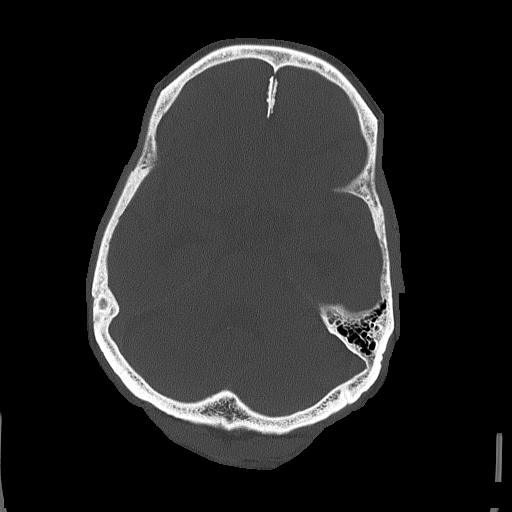

[Series 5: head without cor · coronal · non-contrast · 0.31mm/px · 3 of 73 slices shown]
[im 25/73  brain]
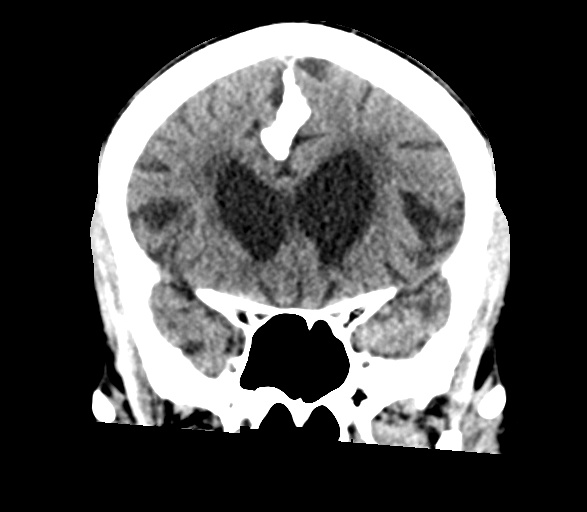
[im 33/73  brain]
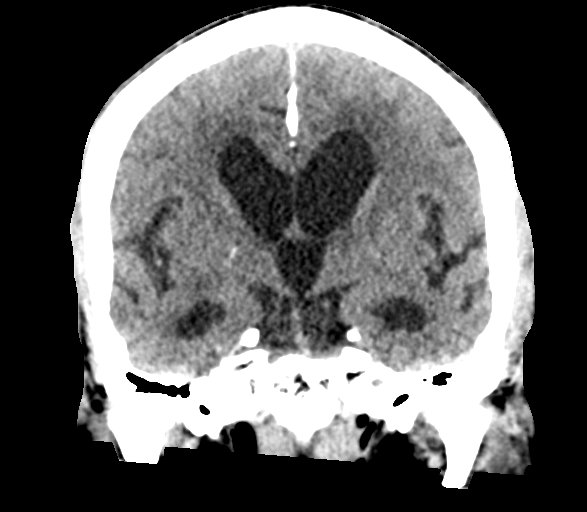
[im 41/73  brain]
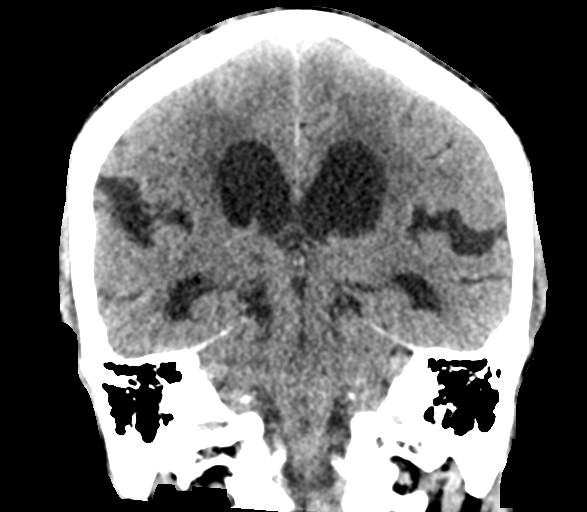

[Series 6: head without sag · sagittal · non-contrast · 0.35mm/px · 3 of 67 slices shown]
[im 23/67  brain]
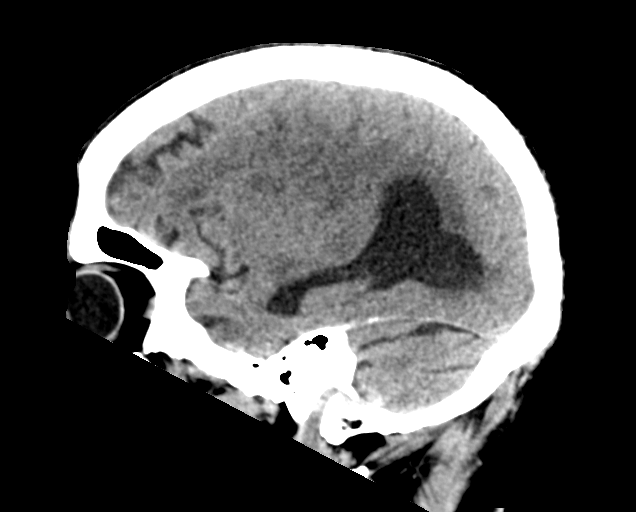
[im 34/67  brain]
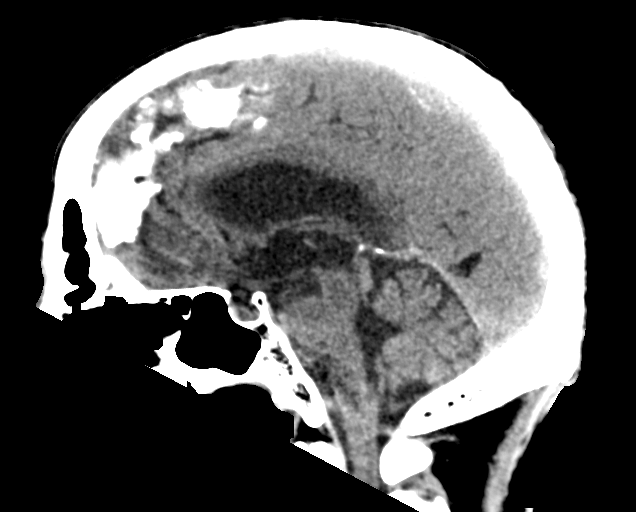
[im 45/67  brain]
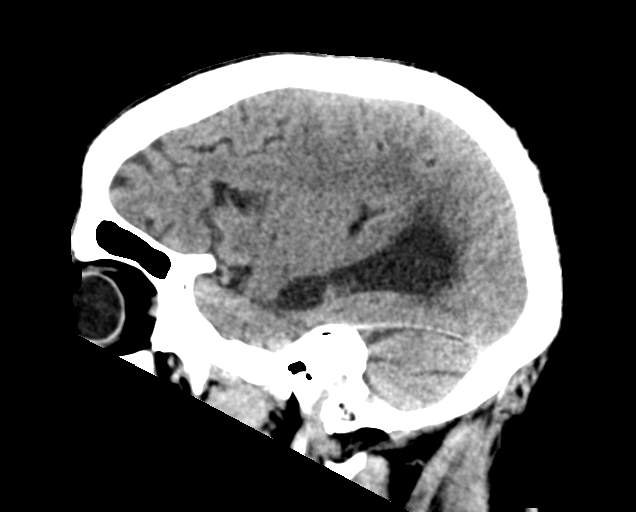

[16 of 47 positions shown; findings below may reference images not displayed]

FINDINGS: Brain: No evidence of acute infarction, hemorrhage, cerebral edema,
mass, mass effect, or midline shift. No extra-axial fluid
collection. Periventricular white matter changes, likely the sequela
of chronic small vessel ischemic disease. Advanced cerebral volume
loss for age. The ventricles are enlarged, with the suggestion of
crowding of the sulci at the vertex and an acute callosal angle,
which can be seen with normal pressure hydrocephalus. Lacunar
infarct in the right internal capsule.

Vascular: No hyperdense vessel. Atherosclerotic calcifications in
the intracranial carotid and vertebral arteries.

Skull: Normal. Negative for fracture or focal lesion.

Sinuses/Orbits: No acute finding.

Other: The mastoid air cells are well aerated.
IMPRESSION: 1. Prominent ventricles, which could be the sequela of central
volume loss, but there is also the suggestion of crowding of the
sulci at the vertex and acute callosal angle, which can be seen in
the setting of normal pressure hydrocephalus. Correlate with
symptoms and consider CSF tap test, if clinically indicated.
2. No additional acute intracranial process.

## 2023-04-04 DIAGNOSIS — E1165 Type 2 diabetes mellitus with hyperglycemia: Secondary | ICD-10-CM | POA: Diagnosis not present

## 2023-05-05 DIAGNOSIS — E1165 Type 2 diabetes mellitus with hyperglycemia: Secondary | ICD-10-CM | POA: Diagnosis not present

## 2023-06-04 DIAGNOSIS — E1165 Type 2 diabetes mellitus with hyperglycemia: Secondary | ICD-10-CM | POA: Diagnosis not present

## 2023-06-21 DIAGNOSIS — Z794 Long term (current) use of insulin: Secondary | ICD-10-CM | POA: Diagnosis not present

## 2023-06-21 DIAGNOSIS — D692 Other nonthrombocytopenic purpura: Secondary | ICD-10-CM | POA: Diagnosis not present

## 2023-06-21 DIAGNOSIS — E782 Mixed hyperlipidemia: Secondary | ICD-10-CM | POA: Diagnosis not present

## 2023-06-21 DIAGNOSIS — E1121 Type 2 diabetes mellitus with diabetic nephropathy: Secondary | ICD-10-CM | POA: Diagnosis not present

## 2023-06-21 DIAGNOSIS — I739 Peripheral vascular disease, unspecified: Secondary | ICD-10-CM | POA: Diagnosis not present

## 2023-06-21 DIAGNOSIS — D6869 Other thrombophilia: Secondary | ICD-10-CM | POA: Diagnosis not present

## 2023-06-21 DIAGNOSIS — I129 Hypertensive chronic kidney disease with stage 1 through stage 4 chronic kidney disease, or unspecified chronic kidney disease: Secondary | ICD-10-CM | POA: Diagnosis not present

## 2023-06-21 DIAGNOSIS — I779 Disorder of arteries and arterioles, unspecified: Secondary | ICD-10-CM | POA: Diagnosis not present

## 2023-06-21 DIAGNOSIS — N184 Chronic kidney disease, stage 4 (severe): Secondary | ICD-10-CM | POA: Diagnosis not present

## 2023-06-26 DIAGNOSIS — D6869 Other thrombophilia: Secondary | ICD-10-CM | POA: Diagnosis not present

## 2023-06-26 DIAGNOSIS — F01A0 Vascular dementia, mild, without behavioral disturbance, psychotic disturbance, mood disturbance, and anxiety: Secondary | ICD-10-CM | POA: Diagnosis not present

## 2023-06-26 DIAGNOSIS — E782 Mixed hyperlipidemia: Secondary | ICD-10-CM | POA: Diagnosis not present

## 2023-06-26 DIAGNOSIS — I6529 Occlusion and stenosis of unspecified carotid artery: Secondary | ICD-10-CM | POA: Diagnosis not present

## 2023-06-26 DIAGNOSIS — N184 Chronic kidney disease, stage 4 (severe): Secondary | ICD-10-CM | POA: Diagnosis not present

## 2023-06-26 DIAGNOSIS — D692 Other nonthrombocytopenic purpura: Secondary | ICD-10-CM | POA: Diagnosis not present

## 2023-06-26 DIAGNOSIS — I739 Peripheral vascular disease, unspecified: Secondary | ICD-10-CM | POA: Diagnosis not present

## 2023-06-26 DIAGNOSIS — E1122 Type 2 diabetes mellitus with diabetic chronic kidney disease: Secondary | ICD-10-CM | POA: Diagnosis not present

## 2023-06-26 DIAGNOSIS — Z0000 Encounter for general adult medical examination without abnormal findings: Secondary | ICD-10-CM | POA: Diagnosis not present

## 2023-07-02 DIAGNOSIS — E1122 Type 2 diabetes mellitus with diabetic chronic kidney disease: Secondary | ICD-10-CM | POA: Diagnosis not present

## 2023-07-02 DIAGNOSIS — I739 Peripheral vascular disease, unspecified: Secondary | ICD-10-CM | POA: Diagnosis not present

## 2023-07-02 DIAGNOSIS — E1139 Type 2 diabetes mellitus with other diabetic ophthalmic complication: Secondary | ICD-10-CM | POA: Diagnosis not present

## 2023-07-02 DIAGNOSIS — N184 Chronic kidney disease, stage 4 (severe): Secondary | ICD-10-CM | POA: Diagnosis not present

## 2023-07-02 DIAGNOSIS — E782 Mixed hyperlipidemia: Secondary | ICD-10-CM | POA: Diagnosis not present

## 2023-07-02 DIAGNOSIS — I129 Hypertensive chronic kidney disease with stage 1 through stage 4 chronic kidney disease, or unspecified chronic kidney disease: Secondary | ICD-10-CM | POA: Diagnosis not present

## 2023-07-04 DIAGNOSIS — E1165 Type 2 diabetes mellitus with hyperglycemia: Secondary | ICD-10-CM | POA: Diagnosis not present

## 2023-08-04 DIAGNOSIS — E1165 Type 2 diabetes mellitus with hyperglycemia: Secondary | ICD-10-CM | POA: Diagnosis not present

## 2023-09-03 DIAGNOSIS — E1165 Type 2 diabetes mellitus with hyperglycemia: Secondary | ICD-10-CM | POA: Diagnosis not present

## 2023-10-04 DIAGNOSIS — E1165 Type 2 diabetes mellitus with hyperglycemia: Secondary | ICD-10-CM | POA: Diagnosis not present

## 2023-10-15 DIAGNOSIS — E782 Mixed hyperlipidemia: Secondary | ICD-10-CM | POA: Diagnosis not present

## 2023-10-15 DIAGNOSIS — I129 Hypertensive chronic kidney disease with stage 1 through stage 4 chronic kidney disease, or unspecified chronic kidney disease: Secondary | ICD-10-CM | POA: Diagnosis not present

## 2023-10-15 DIAGNOSIS — E1165 Type 2 diabetes mellitus with hyperglycemia: Secondary | ICD-10-CM | POA: Diagnosis not present

## 2023-10-15 DIAGNOSIS — E1139 Type 2 diabetes mellitus with other diabetic ophthalmic complication: Secondary | ICD-10-CM | POA: Diagnosis not present

## 2023-10-19 ENCOUNTER — Inpatient Hospital Stay (HOSPITAL_COMMUNITY)
Admission: EM | Admit: 2023-10-19 | Discharge: 2023-10-25 | DRG: 193 | Disposition: A | Discharge status: Home Health | Payer: Medicare PPO | Attending: Internal Medicine | Admitting: Internal Medicine

## 2023-10-19 ENCOUNTER — Encounter (HOSPITAL_COMMUNITY): Payer: Self-pay | Admitting: Emergency Medicine

## 2023-10-19 ENCOUNTER — Emergency Department (HOSPITAL_COMMUNITY): Payer: Medicare PPO

## 2023-10-19 ENCOUNTER — Other Ambulatory Visit: Payer: Self-pay

## 2023-10-19 DIAGNOSIS — I6782 Cerebral ischemia: Secondary | ICD-10-CM | POA: Diagnosis not present

## 2023-10-19 DIAGNOSIS — J189 Pneumonia, unspecified organism: Secondary | ICD-10-CM | POA: Diagnosis present

## 2023-10-19 DIAGNOSIS — F03A0 Unspecified dementia, mild, without behavioral disturbance, psychotic disturbance, mood disturbance, and anxiety: Secondary | ICD-10-CM | POA: Diagnosis present

## 2023-10-19 DIAGNOSIS — Z1152 Encounter for screening for COVID-19: Secondary | ICD-10-CM

## 2023-10-19 DIAGNOSIS — Z833 Family history of diabetes mellitus: Secondary | ICD-10-CM

## 2023-10-19 DIAGNOSIS — I2489 Other forms of acute ischemic heart disease: Secondary | ICD-10-CM | POA: Diagnosis present

## 2023-10-19 DIAGNOSIS — Z7901 Long term (current) use of anticoagulants: Secondary | ICD-10-CM | POA: Diagnosis not present

## 2023-10-19 DIAGNOSIS — R55 Syncope and collapse: Secondary | ICD-10-CM | POA: Diagnosis present

## 2023-10-19 DIAGNOSIS — Z7401 Bed confinement status: Secondary | ICD-10-CM | POA: Diagnosis not present

## 2023-10-19 DIAGNOSIS — N184 Chronic kidney disease, stage 4 (severe): Secondary | ICD-10-CM | POA: Diagnosis present

## 2023-10-19 DIAGNOSIS — R42 Dizziness and giddiness: Secondary | ICD-10-CM | POA: Diagnosis not present

## 2023-10-19 DIAGNOSIS — G459 Transient cerebral ischemic attack, unspecified: Secondary | ICD-10-CM | POA: Diagnosis not present

## 2023-10-19 DIAGNOSIS — I160 Hypertensive urgency: Secondary | ICD-10-CM | POA: Diagnosis present

## 2023-10-19 DIAGNOSIS — Z79899 Other long term (current) drug therapy: Secondary | ICD-10-CM | POA: Diagnosis not present

## 2023-10-19 DIAGNOSIS — R531 Weakness: Secondary | ICD-10-CM | POA: Diagnosis not present

## 2023-10-19 DIAGNOSIS — E1122 Type 2 diabetes mellitus with diabetic chronic kidney disease: Secondary | ICD-10-CM | POA: Diagnosis present

## 2023-10-19 DIAGNOSIS — N390 Urinary tract infection, site not specified: Secondary | ICD-10-CM | POA: Diagnosis present

## 2023-10-19 DIAGNOSIS — Z7982 Long term (current) use of aspirin: Secondary | ICD-10-CM

## 2023-10-19 DIAGNOSIS — G9389 Other specified disorders of brain: Secondary | ICD-10-CM | POA: Diagnosis not present

## 2023-10-19 DIAGNOSIS — Z7984 Long term (current) use of oral hypoglycemic drugs: Secondary | ICD-10-CM | POA: Diagnosis not present

## 2023-10-19 DIAGNOSIS — E860 Dehydration: Secondary | ICD-10-CM | POA: Diagnosis present

## 2023-10-19 DIAGNOSIS — J168 Pneumonia due to other specified infectious organisms: Secondary | ICD-10-CM | POA: Diagnosis not present

## 2023-10-19 DIAGNOSIS — E1151 Type 2 diabetes mellitus with diabetic peripheral angiopathy without gangrene: Secondary | ICD-10-CM | POA: Diagnosis present

## 2023-10-19 DIAGNOSIS — B954 Other streptococcus as the cause of diseases classified elsewhere: Secondary | ICD-10-CM | POA: Diagnosis present

## 2023-10-19 DIAGNOSIS — N179 Acute kidney failure, unspecified: Secondary | ICD-10-CM | POA: Diagnosis present

## 2023-10-19 DIAGNOSIS — E1169 Type 2 diabetes mellitus with other specified complication: Secondary | ICD-10-CM

## 2023-10-19 DIAGNOSIS — Z794 Long term (current) use of insulin: Secondary | ICD-10-CM

## 2023-10-19 DIAGNOSIS — E782 Mixed hyperlipidemia: Secondary | ICD-10-CM | POA: Diagnosis present

## 2023-10-19 DIAGNOSIS — Z9221 Personal history of antineoplastic chemotherapy: Secondary | ICD-10-CM | POA: Diagnosis not present

## 2023-10-19 DIAGNOSIS — D509 Iron deficiency anemia, unspecified: Secondary | ICD-10-CM | POA: Diagnosis present

## 2023-10-19 DIAGNOSIS — R5383 Other fatigue: Secondary | ICD-10-CM | POA: Diagnosis not present

## 2023-10-19 DIAGNOSIS — Z85038 Personal history of other malignant neoplasm of large intestine: Secondary | ICD-10-CM | POA: Diagnosis not present

## 2023-10-19 DIAGNOSIS — E785 Hyperlipidemia, unspecified: Secondary | ICD-10-CM | POA: Diagnosis present

## 2023-10-19 DIAGNOSIS — I129 Hypertensive chronic kidney disease with stage 1 through stage 4 chronic kidney disease, or unspecified chronic kidney disease: Secondary | ICD-10-CM | POA: Diagnosis present

## 2023-10-19 DIAGNOSIS — R197 Diarrhea, unspecified: Secondary | ICD-10-CM | POA: Diagnosis present

## 2023-10-19 DIAGNOSIS — R61 Generalized hyperhidrosis: Secondary | ICD-10-CM | POA: Diagnosis not present

## 2023-10-19 DIAGNOSIS — R918 Other nonspecific abnormal finding of lung field: Secondary | ICD-10-CM | POA: Diagnosis not present

## 2023-10-19 DIAGNOSIS — G9340 Encephalopathy, unspecified: Secondary | ICD-10-CM

## 2023-10-19 DIAGNOSIS — E872 Acidosis, unspecified: Secondary | ICD-10-CM | POA: Diagnosis present

## 2023-10-19 DIAGNOSIS — I10 Essential (primary) hypertension: Secondary | ICD-10-CM | POA: Diagnosis present

## 2023-10-19 DIAGNOSIS — G9341 Metabolic encephalopathy: Secondary | ICD-10-CM | POA: Diagnosis present

## 2023-10-19 DIAGNOSIS — Z7985 Long-term (current) use of injectable non-insulin antidiabetic drugs: Secondary | ICD-10-CM

## 2023-10-19 DIAGNOSIS — E119 Type 2 diabetes mellitus without complications: Secondary | ICD-10-CM

## 2023-10-19 DIAGNOSIS — I959 Hypotension, unspecified: Secondary | ICD-10-CM | POA: Diagnosis not present

## 2023-10-19 DIAGNOSIS — R7989 Other specified abnormal findings of blood chemistry: Secondary | ICD-10-CM

## 2023-10-19 LAB — COMPREHENSIVE METABOLIC PANEL
ALT: 10 U/L (ref 0–44)
AST: 15 U/L (ref 15–41)
Albumin: 3.5 g/dL (ref 3.5–5.0)
Alkaline Phosphatase: 70 U/L (ref 38–126)
Anion gap: 10 (ref 5–15)
BUN: 48 mg/dL — ABNORMAL HIGH (ref 8–23)
CO2: 24 mmol/L (ref 22–32)
Calcium: 9.5 mg/dL (ref 8.9–10.3)
Chloride: 104 mmol/L (ref 98–111)
Creatinine, Ser: 3.17 mg/dL — ABNORMAL HIGH (ref 0.61–1.24)
GFR, Estimated: 18 mL/min — ABNORMAL LOW (ref >60)
Glucose, Bld: 190 mg/dL — ABNORMAL HIGH (ref 70–99)
Potassium: 4.7 mmol/L (ref 3.5–5.1)
Sodium: 138 mmol/L (ref 135–145)
Total Bilirubin: 0.5 mg/dL (ref <1.2)
Total Protein: 7.6 g/dL (ref 6.5–8.1)

## 2023-10-19 LAB — CBC WITH DIFFERENTIAL/PLATELET
Abs Immature Granulocytes: 0.07 10*3/uL (ref 0.00–0.07)
Basophils Absolute: 0 10*3/uL (ref 0.0–0.1)
Basophils Relative: 0 %
Eosinophils Absolute: 0.1 10*3/uL (ref 0.0–0.5)
Eosinophils Relative: 1 %
HCT: 30.6 % — ABNORMAL LOW (ref 39.0–52.0)
Hemoglobin: 10 g/dL — ABNORMAL LOW (ref 13.0–17.0)
Immature Granulocytes: 0 %
Lymphocytes Relative: 7 %
Lymphs Abs: 1.1 10*3/uL (ref 0.7–4.0)
MCH: 25.8 pg — ABNORMAL LOW (ref 26.0–34.0)
MCHC: 32.7 g/dL (ref 30.0–36.0)
MCV: 78.9 fL — ABNORMAL LOW (ref 80.0–100.0)
Monocytes Absolute: 1.1 10*3/uL — ABNORMAL HIGH (ref 0.1–1.0)
Monocytes Relative: 7 %
Neutro Abs: 14.1 10*3/uL — ABNORMAL HIGH (ref 1.7–7.7)
Neutrophils Relative %: 85 %
Platelets: 269 10*3/uL (ref 150–400)
RBC: 3.88 MIL/uL — ABNORMAL LOW (ref 4.22–5.81)
RDW: 14.9 % (ref 11.5–15.5)
WBC: 16.6 10*3/uL — ABNORMAL HIGH (ref 4.0–10.5)
nRBC: 0 % (ref 0.0–0.2)

## 2023-10-19 LAB — RESP PANEL BY RT-PCR (RSV, FLU A&B, COVID)  RVPGX2
Influenza A by PCR: NEGATIVE
Influenza B by PCR: NEGATIVE
Resp Syncytial Virus by PCR: NEGATIVE
SARS Coronavirus 2 by RT PCR: NEGATIVE

## 2023-10-19 LAB — TROPONIN I (HIGH SENSITIVITY)
Troponin I (High Sensitivity): 15 ng/L (ref <18)
Troponin I (High Sensitivity): 24 ng/L — ABNORMAL HIGH (ref <18)

## 2023-10-19 LAB — TSH: TSH: 3.701 u[IU]/mL (ref 0.350–4.500)

## 2023-10-19 LAB — MAGNESIUM: Magnesium: 2.1 mg/dL (ref 1.7–2.4)

## 2023-10-19 LAB — LACTIC ACID, PLASMA
Lactic Acid, Venous: 2.3 mmol/L (ref 0.5–1.9)
Lactic Acid, Venous: 1.2 mmol/L (ref 0.5–1.9)

## 2023-10-19 MED ORDER — INSULIN ASPART 100 UNIT/ML IJ SOLN
0.0000 [IU] | Freq: Three times a day (TID) | INTRAMUSCULAR | Status: DC
  Administered 2023-10-20: 1 [IU] via SUBCUTANEOUS
  Administered 2023-10-20: 2 [IU] via SUBCUTANEOUS
  Administered 2023-10-20 – 2023-10-21 (×3): 1 [IU] via SUBCUTANEOUS
  Administered 2023-10-21 – 2023-10-22 (×3): 2 [IU] via SUBCUTANEOUS
  Administered 2023-10-22: 1 [IU] via SUBCUTANEOUS
  Administered 2023-10-23: 2 [IU] via SUBCUTANEOUS
  Administered 2023-10-23: 1 [IU] via SUBCUTANEOUS
  Administered 2023-10-24: 2 [IU] via SUBCUTANEOUS
  Administered 2023-10-25: 1 [IU] via SUBCUTANEOUS

## 2023-10-19 MED ORDER — INSULIN ASPART 100 UNIT/ML IJ SOLN: 0.0000 [IU] | Freq: Every day | INTRAMUSCULAR | Status: DC

## 2023-10-19 MED ORDER — SODIUM BICARBONATE 650 MG PO TABS
650.0000 mg | ORAL_TABLET | Freq: Every day | ORAL | Status: DC
  Administered 2023-10-20 – 2023-10-25 (×6): 650 mg via ORAL
  Filled 2023-10-19 (×7): qty 1

## 2023-10-19 MED ORDER — SODIUM CHLORIDE 0.9 % IV BOLUS
1000.0000 mL | Freq: Once | INTRAVENOUS | Status: AC
  Administered 2023-10-19: 1000 mL via INTRAVENOUS

## 2023-10-19 MED ORDER — SODIUM CHLORIDE 0.9 % IV SOLN
500.0000 mg | INTRAVENOUS | Status: DC
  Administered 2023-10-20 – 2023-10-23 (×4): 500 mg via INTRAVENOUS
  Filled 2023-10-19 (×4): qty 5

## 2023-10-19 MED ORDER — ACETAMINOPHEN 650 MG RE SUPP: 650.0000 mg | Freq: Four times a day (QID) | RECTAL | Status: DC | PRN

## 2023-10-19 MED ORDER — HYDRALAZINE HCL 20 MG/ML IJ SOLN: 5.0000 mg | Freq: Four times a day (QID) | INTRAMUSCULAR | Status: DC | PRN

## 2023-10-19 MED ORDER — ACETAMINOPHEN 325 MG PO TABS
650.0000 mg | ORAL_TABLET | Freq: Four times a day (QID) | ORAL | Status: DC | PRN
  Administered 2023-10-20: 650 mg via ORAL
  Filled 2023-10-19: qty 2

## 2023-10-19 MED ORDER — SODIUM CHLORIDE 0.9 % IV SOLN: INTRAVENOUS | Status: DC

## 2023-10-19 MED ORDER — SODIUM CHLORIDE 0.9 % IV SOLN
1.0000 g | Freq: Once | INTRAVENOUS | Status: AC
  Administered 2023-10-19: 1 g via INTRAVENOUS
  Filled 2023-10-19: qty 10

## 2023-10-19 MED ORDER — CEFTRIAXONE SODIUM 2 G IJ SOLR
2.0000 g | INTRAMUSCULAR | Status: DC
  Administered 2023-10-20 – 2023-10-24 (×5): 2 g via INTRAVENOUS
  Filled 2023-10-19 (×5): qty 20

## 2023-10-19 MED ORDER — SODIUM CHLORIDE 0.9 % IV SOLN
500.0000 mg | Freq: Once | INTRAVENOUS | Status: AC
  Administered 2023-10-19: 500 mg via INTRAVENOUS
  Filled 2023-10-19: qty 5

## 2023-10-19 MED ORDER — ROSUVASTATIN CALCIUM 5 MG PO TABS
10.0000 mg | ORAL_TABLET | Freq: Every day | ORAL | Status: DC
  Administered 2023-10-20 – 2023-10-25 (×6): 10 mg via ORAL
  Filled 2023-10-19 (×7): qty 2

## 2023-10-19 MED ORDER — SODIUM CHLORIDE 0.9 % IV BOLUS
500.0000 mL | Freq: Once | INTRAVENOUS | Status: AC
  Administered 2023-10-19: 500 mL via INTRAVENOUS

## 2023-10-19 NOTE — ED Notes (Signed)
Patient transported to X-ray 

## 2023-10-19 NOTE — ED Provider Notes (Signed)
[place] [facility]MENT AT [facility] Provider Note   CSN: [id number] Arrival date & time: 10/19/23  1830     History  Chief Complaint  Patient presents with   Loss of Consciousness    Jonathan Baird is a 89 y.o. male.  The history is provided by the patient and medical records. No language interpreter was used.  Loss of Consciousness Episode history:  Single Most recent episode:  Today Duration:  2 minutes Timing:  Unable to specify Chronicity:  New Associated symptoms: malaise/fatigue, nausea and vomiting   Associated symptoms: no chest pain, no diaphoresis, no dizziness, no fever, no headaches, no palpitations, no recent fall, no recent injury, no seizures and no shortness of breath        Home Medications Prior to Admission medications   Medication Sig Start Date End Date Taking? Authorizing Provider  amLODipine (NORVASC) 10 MG tablet Take 10 mg by mouth daily. 03/05/18   [provider]  amLODipine (NORVASC) 10 MG tablet 1 tablet    [provider]  amLODipine (NORVASC) 2.5 MG tablet Take 2.5 mg by mouth daily.    [provider]  aspirin (ASPIRIN 81) 81 MG chewable tablet 1 tablet    [provider]  B-D ULTRAFINE III SHORT PEN 31G X 8 MM MISC Inject into the skin 2 (two) times daily. 10/26/20   [provider]  calcitRIOL (ROCALTROL) 0.25 MCG capsule  03/15/18   [provider]  Continuous Blood Gluc Sensor (FREESTYLE LIBRE 2 SENSOR) MISC See admin instructions.    [provider]  Dulaglutide (TRULICITY) 1.5 MG/0.5ML SOPN 1.5 mg    [provider]  Dulaglutide (TRULICITY) 1.5 MG/0.5ML SOPN Inject 1 pen (1.5 mg) into skin weekly 28 days 01/02/22     Dulaglutide (TRULICITY) 3 MG/0.5ML SOPN Inject 3 mg into the skin once weekly 28 days 02/01/22     glipiZIDE (GLUCOTROL) 5 MG tablet Take 5 mg by mouth 2 (two) times daily before a meal. 5mg  BID 2.5 once daily    [provider]  GVOKE HYPOPEN 2-PACK 1 MG/0.2ML SOAJ Inject into the skin. 02/08/22   [provider]  hydrALAZINE (APRESOLINE) 25 MG tablet TAKE 1 TABLET BY MOUTH THREE TIMES A DAY WITH FOOD FOR 30 DAYS 90    [provider]  hydrALAZINE (APRESOLINE) 50 MG tablet 1 tablet    [provider]  hydrochlorothiazide (MICROZIDE) 12.5 MG capsule Take 2.5 mg by mouth daily.    [provider]  insulin aspart (NOVOLOG FLEXPEN) 100 UNIT/ML FlexPen 3 units    [provider]  Insulin Glargine (BASAGLAR KWIKPEN) 100 UNIT/ML 6 units    [provider]  insulin lispro (HUMALOG KWIKPEN) 100 UNIT/ML KwikPen See admin instructions.    [provider]  JANUVIA 25 MG tablet  03/17/18   [provider]  lisinopril (PRINIVIL,ZESTRIL) 40 MG tablet Take 40 mg by mouth daily.    [provider]  lisinopril (ZESTRIL) 40 MG tablet 1 tablet    [provider]  pravastatin (PRAVACHOL) 80 MG tablet Take 70 mg by mouth daily.    [provider]  saxagliptin HCl (ONGLYZA) 5 MG TABS tablet Take by mouth daily. Cut 5mg  in half    [provider]  sodium bicarbonate 650 MG tablet Take 650 mg by mouth 3 (three) times daily. 07/16/15   [provider]  sodium bicarbonate 650 MG tablet 1 tablet 03/06/21   [provider]  XARELTO 2.5 MG TABS tablet TAKE 1 TABLET BY MOUTH TWICE A DAY. TAKE WITH ASPIRIN 81MG  DAILY. 03/04/18   [provider]      Allergies    Patient has no known allergies.    Review of Systems   Review of Systems  Constitutional:  Positive for chills, fatigue and malaise/fatigue. Negative for diaphoresis and fever.  HENT:  Negative for congestion.   Eyes:  Negative for visual disturbance.  Respiratory:  Positive for cough. Negative for chest tightness, shortness of breath and wheezing.   Cardiovascular:  Positive for syncope. Negative for chest pain, palpitations and leg swelling.   Gastrointestinal:  Positive for diarrhea, nausea and vomiting. Negative for abdominal pain and constipation.  Genitourinary:  Positive for dysuria. Negative for flank pain.  Musculoskeletal:  Negative for back pain, neck pain and neck stiffness.  Skin:  Negative for rash and wound.  Neurological:  Negative for dizziness, seizures, light-headedness, numbness and headaches.  Psychiatric/Behavioral:  Negative for agitation.   All other systems reviewed and are negative.   Physical Exam Updated Vital Signs Temp 98.4 F (36.9 C) (Oral)   Ht 5\' 11"  (1.803 m)   Wt 76.2 kg   BMI 23.43 kg/m  Physical Exam Vitals and nursing note reviewed.  Constitutional:      General: He is not in acute distress.    Appearance: He is well-developed. He is not ill-appearing, toxic-appearing or diaphoretic.  HENT:     Head: Normocephalic and atraumatic.     Nose: No congestion or rhinorrhea.     Mouth/Throat:     Mouth: Mucous membranes are dry.     Pharynx: No oropharyngeal exudate or posterior oropharyngeal erythema.  Eyes:     Extraocular Movements: Extraocular movements intact.     Conjunctiva/sclera: Conjunctivae normal.     Pupils: Pupils are equal, round, and reactive to light.  Cardiovascular:     Rate and Rhythm: Normal rate and regular rhythm.     Pulses: Normal pulses.     Heart sounds: No murmur heard. Pulmonary:     Effort: Pulmonary effort is normal. No respiratory distress.     Breath sounds: Rhonchi present. No wheezing or rales.  Chest:     Chest wall: No tenderness.  Abdominal:     General: Abdomen is flat. There is no distension.     Palpations: Abdomen is soft.     Tenderness: There is no abdominal tenderness. There is no right CVA tenderness, left CVA tenderness, guarding or rebound.  Musculoskeletal:        General: No swelling or tenderness.     Cervical back: Neck supple. No tenderness.  Skin:    General: Skin is warm and dry.     Capillary Refill: Capillary refill  takes less than 2 seconds.     Findings: No erythema or rash.  Neurological:     General: No focal deficit present.     Mental Status: He is alert.  Psychiatric:        Mood and Affect: Mood normal.     ED Results / Procedures / Treatments   Labs (all labs ordered are listed, but only abnormal results are displayed) Labs Reviewed  CBC WITH DIFFERENTIAL/PLATELET - Abnormal; Notable for the following components:      Result Value   WBC 16.6 (*)    RBC 3.88 (*)    Hemoglobin 10.0 (*)    HCT 30.6 (*)    MCV 78.9 (*)    MCH 25.8 (*)  Neutro Abs 14.1 (*)    Monocytes Absolute 1.1 (*)    All other components within normal limits  COMPREHENSIVE METABOLIC PANEL - Abnormal; Notable for the following components:   Glucose, Bld 190 (*)    BUN 48 (*)    Creatinine, Ser 3.17 (*)    GFR, Estimated 18 (*)    All other components within normal limits  LACTIC ACID, PLASMA - Abnormal; Notable for the following components:   Lactic Acid, Venous 2.3 (*)    All other components within normal limits  RESP PANEL BY RT-PCR (RSV, FLU A&B, COVID)  RVPGX2  MAGNESIUM  TSH  URINALYSIS, W/ REFLEX TO CULTURE (INFECTION SUSPECTED)  LACTIC ACID, PLASMA  TROPONIN I (HIGH SENSITIVITY)  TROPONIN I (HIGH SENSITIVITY)    EKG EKG Interpretation Date/Time:  Saturday October 19 2023 18:46:39 EST Ventricular Rate:  96 PR Interval:  237 QRS Duration:  89 QT Interval:  363 QTC Calculation: 459 R Axis:   73  Text Interpretation: Sinus rhythm Sinus pause Prolonged PR interval Consider left ventricular hypertrophy Repol abnrm suggests ischemia, inferior leads Artifact in lead(s) I III aVR aVL V2 V5 V6 when compared to  ECG before today, faster rate. NO STEMI Confirmed by Theda Belfast ([id number]) on 10/19/2023 6:52:18 PM  Radiology DG Chest 2 View  Result Date: 10/19/2023 CLINICAL DATA:  Syncope, fatigue EXAM: CHEST - 2 VIEW COMPARISON:  04/26/2022 FINDINGS: Patchy airspace disease in the lower lobes,  left greater than right concerning for pneumonia. No effusions. Heart and mediastinal contours within normal limits. No acute bony abnormality. IMPRESSION: Bilateral lower lobe airspace opacities, left greater than right concerning for pneumonia. Electronically Signed   By: Charlett Nose M.D.   On: 10/19/2023 19:48    Procedures Procedures    CRITICAL CARE Performed by: Canary Brim Quinnlyn Hearns Total critical care time: 25 minutes Critical care time was exclusive of separately billable procedures and treating other patients. Critical care was necessary to treat or prevent imminent or life-threatening deterioration. Critical care was time spent personally by me on the following activities: development of treatment plan with patient and/or surrogate as well as nursing, discussions with consultants, evaluation of patient's response to treatment, examination of patient, obtaining history from patient or surrogate, ordering and performing treatments and interventions, ordering and review of laboratory studies, ordering and review of radiographic studies, pulse oximetry and re-evaluation of patient's condition.  Medications Ordered in ED Medications  cefTRIAXone (ROCEPHIN) 1 g in sodium chloride 0.9 % 100 mL IVPB (has no administration in time range)  azithromycin (ZITHROMAX) 500 mg in sodium chloride 0.9 % 250 mL IVPB (has no administration in time range)  sodium chloride 0.9 % bolus 1,000 mL (has no administration in time range)  sodium chloride 0.9 % bolus 500 mL (0 mLs Intravenous Stopped 10/19/23 2101)    ED Course/ Medical Decision Making/ A&P                                 Medical Decision Making   EUFEMIO STFLEUR is a 89 y.o. male with a past medical history significant for diabetes, CKD, hypertension, previous colon cancer, CKD, and peripheral vascular disease who presents for syncope.  According to patient, he has had some dry cough and has not had much of an appetite for the last few  days.  He vomited up some breakfast today and then had a syncopal episode this evening.  He reportedly  was unresponsive for about 2 minutes but then came back.  No reported trauma or fall.  He denies any chest pain or shortness of breath.  He denies any constipation but has had some mild diarrhea.  Reports some mild dysuria.  On my evaluation, patient's lungs had some faint coarseness.  Chest was nontender.  Abdomen nontender.  He does have a murmur.  Legs are nontender.  Patient moving all extremities.  Patient otherwise reports feeling fine.  Due to patient's age, this reported syncope, and fatigue with some GI symptoms, he will get workup to look for occult infection.  He does have dry mucous membranes, will give a small amount of fluids initially.  Patient's workup began to return and he does have evidence of acute pneumonia.  Patient's labs also reveal leukocytosis, mild anemia, and an AKI.  Troponin normal, will trend.  TSH normal.  Magnesium normal.  His urinalysis not been collected but we will give Rocephin azithromycin to treat the pneumonia and possibly UTI.  His COVID swab was ordered given his cough with his ongoing pandemic.  Lactic acid was elevated, more fluid was ordered.  Due to his age and pneumonia and AKI, patient to be admitted for further management.  Patient agrees with this plan and he is on room air at this time.         Final Clinical Impression(s) / ED Diagnoses Final diagnoses:  Pneumonia due to infectious organism, unspecified laterality, unspecified part of lung  AKI (acute kidney injury) (HCC)  Syncope, unspecified syncope type    Clinical Impression: 1. Pneumonia due to infectious organism, unspecified laterality, unspecified part of lung   2. AKI (acute kidney injury) (HCC)   3. Syncope, unspecified syncope type     Disposition: Admit  This note was prepared with assistance of Dragon voice recognition software. Occasional wrong-word or sound-a-like  substitutions may have occurred due to the inherent limitations of voice recognition software.       Ocie Stanzione, Canary Brim, MD 10/19/23 2108

## 2023-10-19 NOTE — H&P (Incomplete)
History and Physical    Jonathan Baird FIE:[id number] DOB: Dec 22, 1932 DOA: 10/19/2023  PCP: Georgianne Fick, MD  Patient coming from: Home  Chief Complaint: Syncope  HPI: Jonathan Baird is a 89 y.o. male with medical history significant of mild dementia, hypertension, hyperlipidemia, insulin-dependent type 2 diabetes, CKD stage IV, PVD and right internal carotid artery stenosis on Xarelto, history of colon cancer status post hemicolectomy and chemotherapy in 2005 presented to the ED with complaints of syncope, cough, and vomiting.  Vital signs on arrival: Temperature 98.4 F, pulse 88, respiratory rate 18, blood pressure 170/72, and SpO2 98% on room air.  Labs notable for WBC count 16.6, hemoglobin 10.0, MCV 78.9, glucose 190, BUN 48, creatinine 3.1, initial lactic acid 2.3 and repeat pending, UA pending, magnesium 2.1, TSH normal, initial troponin negative and repeat pending, COVID/influenza/RSV PCR pending.  Chest x-ray showing bilateral lower lobe airspace opacities, left greater than right concerning for pneumonia. Patient was given ceftriaxone, azithromycin, and 1.5 L normal saline.  [facility] called to admit.  Patient is AAOx3 but appears slightly confused.  He knows the ambulance brought him here but is not sure why.  He does report passing out at home but is not able to give any additional details.  He reports 1 episode of vomiting and diarrhea at home earlier today but symptoms seem to have resolved.  He denies any abdominal pain.  He is reporting chills and poor appetite.  Denies cough, shortness of breath, or chest pain.  States he stays at home with his wife and daughter.  Review of Systems:  Review of Systems  All other systems reviewed and are negative.   Past Medical History:  Diagnosis Date  . Carotid artery occlusion   . Colon cancer (HCC) 10/12/2004   stage III  . Diabetes mellitus     Past Surgical History:  Procedure Laterality Date  . hemocolectomy  2005      reports that he has never smoked. He has never used smokeless tobacco. He reports that he does not drink alcohol and does not use drugs.  No Known Allergies  Family History  Problem Relation Age of Onset  . Cancer Mother   . Diabetes Father   . Cancer Sister     Prior to Admission medications   Medication Sig Start Date End Date Taking? Authorizing Provider  amLODipine (NORVASC) 10 MG tablet Take 10 mg by mouth daily. 03/05/18  Yes [provider]  aspirin (ASPIRIN 81) 81 MG chewable tablet 1 tablet   Yes [provider]  GVOKE HYPOPEN 2-PACK 1 MG/0.2ML SOAJ Inject 1 mg into the skin once as needed (for hypoglycemia). 02/08/22  Yes [provider]  hydrALAZINE (APRESOLINE) 25 MG tablet Take 50 mg by mouth 2 (two) times daily with a meal.   Yes [provider]  hydrochlorothiazide (HYDRODIURIL) 25 MG tablet Take 25 mg by mouth daily.   Yes [provider]  insulin lispro (HUMALOG KWIKPEN) 100 UNIT/ML KwikPen Inject 4 Units into the skin 3 (three) times daily after meals.   Yes [provider]  lisinopril (PRINIVIL,ZESTRIL) 40 MG tablet Take 40 mg by mouth daily.   Yes [provider]  rosuvastatin (CRESTOR) 10 MG tablet Take 10 mg by mouth daily.   Yes [provider]  sodium bicarbonate 650 MG tablet Take 650 mg by mouth daily. 07/16/15  Yes [provider]  XARELTO 2.5 MG TABS tablet TAKE 1 TABLET BY MOUTH TWICE A DAY. TAKE WITH  ASPIRIN 81MG  DAILY. 03/04/18  Yes [provider]  B-D ULTRAFINE III SHORT PEN 31G X 8 MM MISC Inject into the skin 2 (two) times daily. 10/26/20   [provider]  calcitRIOL (ROCALTROL) 0.25 MCG capsule  03/15/18   [provider]  Continuous Blood Gluc Sensor (FREESTYLE LIBRE 2 SENSOR) MISC See admin instructions.    [provider]  Dulaglutide (TRULICITY) 1.5 MG/0.5ML SOPN 1.5 mg Patient not taking: Reported on 10/19/2023    [provider]     Physical Exam: Vitals:   10/19/23 1842 10/19/23 1915 10/19/23 2145 10/19/23 2200  BP: (!) 170/72 (!) 145/88 (!) 156/83 (!) 161/69  Pulse: 88 89 90 87  Resp: 18 18 15  (!) 23  Temp: 98.4 F (36.9 C)     TempSrc: Oral     SpO2: 98% 96% 100% 100%  Weight:      Height:        Physical Exam Vitals reviewed.  Constitutional:      General: He is not in acute distress.    Appearance: He is ill-appearing.     Comments: Lethargic  HENT:     Head: Normocephalic and atraumatic.  Eyes:     Extraocular Movements: Extraocular movements intact.  Cardiovascular:     Rate and Rhythm: Normal rate and regular rhythm.     Pulses: Normal pulses.  Pulmonary:     Effort: Pulmonary effort is normal. No respiratory distress.     Breath sounds: No wheezing.     Comments: Mild bibasilar crackles Abdominal:     General: Bowel sounds are normal. There is no distension.     Palpations: Abdomen is soft.     Tenderness: There is no abdominal tenderness. There is no guarding or rebound.  Musculoskeletal:     Cervical back: Normal range of motion.     Right lower leg: No edema.     Left lower leg: No edema.  Skin:    General: Skin is warm and dry.  Neurological:     General: No focal deficit present.     Mental Status: He is alert and oriented to person, place, and time.     Cranial Nerves: No cranial nerve deficit.     Sensory: No sensory deficit.     Motor: No weakness.     Labs on Admission: I have personally reviewed following labs and imaging studies  CBC: Recent Labs  Lab 10/19/23 1854  WBC 16.6*  NEUTROABS 14.1*  HGB 10.0*  HCT 30.6*  MCV 78.9*  PLT 269   Basic Metabolic Panel: Recent Labs  Lab 10/19/23 1854  NA 138  K 4.7  CL 104  CO2 24  GLUCOSE 190*  BUN 48*  CREATININE 3.17*  CALCIUM 9.5  MG 2.1   GFR: Estimated Creatinine Clearance: 17.2 mL/min (A) (by C-G formula based on SCr of 3.17 mg/dL (H)). Liver Function Tests: Recent Labs  Lab 10/19/23 1854   AST 15  ALT 10  ALKPHOS 70  BILITOT 0.5  PROT 7.6  ALBUMIN 3.5   No results for input(s): "LIPASE", "AMYLASE" in the last 168 hours. No results for input(s): "AMMONIA" in the last 168 hours. Coagulation Profile: No results for input(s): "INR", "PROTIME" in the last 168 hours. Cardiac Enzymes: No results for input(s): "CKTOTAL", "CKMB", "CKMBINDEX", "TROPONINI" in the last 168 hours. BNP (last 3 results) No results for input(s): "PROBNP" in the last 8760 hours. HbA1C: No results for input(s): "HGBA1C" in the last 72 hours. CBG:  No results for input(s): "GLUCAP" in the last 168 hours. Lipid Profile: No results for input(s): "CHOL", "HDL", "LDLCALC", "TRIG", "CHOLHDL", "LDLDIRECT" in the last 72 hours. Thyroid Function Tests: Recent Labs    10/19/23 1854  TSH 3.701   Anemia Panel: No results for input(s): "VITAMINB12", "FOLATE", "FERRITIN", "TIBC", "IRON", "RETICCTPCT" in the last 72 hours. Urine analysis:    Component Value Date/Time   COLORURINE STRAW (A) 04/26/2022 1710   APPEARANCEUR CLEAR 04/26/2022 1710   LABSPEC 1.010 04/26/2022 1710   PHURINE 7.0 04/26/2022 1710   GLUCOSEU NEGATIVE 04/26/2022 1710   HGBUR NEGATIVE 04/26/2022 1710   BILIRUBINUR NEGATIVE 04/26/2022 1710   KETONESUR NEGATIVE 04/26/2022 1710   PROTEINUR NEGATIVE 04/26/2022 1710   NITRITE NEGATIVE 04/26/2022 1710   LEUKOCYTESUR NEGATIVE 04/26/2022 1710    Radiological Exams on Admission: DG Chest 2 View  Result Date: 10/19/2023 CLINICAL DATA:  Syncope, fatigue EXAM: CHEST - 2 VIEW COMPARISON:  04/26/2022 FINDINGS: Patchy airspace disease in the lower lobes, left greater than right concerning for pneumonia. No effusions. Heart and mediastinal contours within normal limits. No acute bony abnormality. IMPRESSION: Bilateral lower lobe airspace opacities, left greater than right concerning for pneumonia. Electronically Signed   By: Charlett Nose M.D.   On: 10/19/2023 19:48    EKG: Independently reviewed.   Sinus or ectopic atrial rhythm, PR prolongation, LVH.  Assessment and Plan  Community-acquired pneumonia ?Sepsis Chest x-ray showing bilateral lower lobe airspace opacities, left greater than right concerning for pneumonia.  Not hypoxic, currently satting in the high 90s on room air.  COVID/influenza/RSV PCR negative.  WBC count 16.6 but does not meet any other SIRS criteria.  Mild lactic acidosis resolved after IV fluids.  Not hypotensive.  Continue ceftriaxone and azithromycin.  Order sputum Gram stain and culture, strep pneumo and Legionella urinary antigens.  Trend WBC count.  Order blood cultures.  AKI on CKD stage IV Likely prerenal in etiology from dehydration/poor p.o. intake.  BUN 48, creatinine 3.1 (previously 2.4 on labs done 1.5 years ago and GFR was 25 at that time).  Kidney stones less likely as patient is not endorsing any abdominal or flank pain.  Continue gentle IV fluid hydration and monitor renal function.  Avoid nephrotoxic agents/contrast.  Hold home hydrochlorothiazide and lisinopril.  Vomiting and diarrhea Patient reports 1 episode of vomiting and diarrhea earlier today and symptoms seem to have resolved.  No elevation of LFTs.  No abdominal pain or tenderness, resting comfortably.  Testing negative for COVID and influenza.  Syncope Etiology unclear at this time.  Hold antihypertensives and check orthostatics.  Stat CT head ordered.  Continue cardiac monitoring and echocardiogram has been ordered.  Also given known history of carotid artery stenosis, carotid Dopplers ordered.  Acute encephalopathy Suspect this is related to dehydration/AKI and infection.  Does have mild dementia at baseline.  No fever or meningeal signs.  UA pending.  TSH normal.  Stat CT head ordered.  Delirium precautions.  Mild troponin elevation Troponin 15> 24, likely due to demand ischemia rather than ACS as patient denies chest pain.  EKG without acute ischemic changes.  Trend troponin.  Chronic  microcytic anemia Hemoglobin currently 10.0, previously 10.8 on labs 1.5 years ago.  Patient is not endorsing any symptoms of GI bleed.  No other obvious bleeding.  Monitor H&H.  Check iron, ferritin, and TIBC.  Hypertension Hold antihypertensives until orthostatics checked.  IV hydralazine PRN SBP >180.  Hyperlipidemia Continue Crestor.  Insulin-dependent type 2 diabetes Glucose 190.  Last A1c 6.8 in July 2007, repeat ordered.  He is not on long-acting insulin at home.  Placed on sensitive sliding scale insulin ACHS.  History of PVD and right internal carotid artery stenosis Hold Xarelto until CT head is done.  DVT prophylaxis: SCDs Code Status: Full Code (discussed with the patient) Family Communication: No family available at this time. Level of care: Telemetry bed Admission status: It is my clinical opinion that referral for OBSERVATION is reasonable and necessary in this patient based on the above information provided. The aforementioned taken together are felt to place the patient at high risk for further clinical deterioration. However, it is anticipated that the patient may be medically stable for discharge from the hospital within 24 to 48 hours.  John Giovanni MD [facility]  If 7PM-7AM, please contact night-coverage [email or web address]  10/19/2023, 10:20 PM

## 2023-10-19 NOTE — H&P (Signed)
History and Physical    Jonathan Baird ZOX:[id number] DOB: [birth year] DOA: 10/19/2023  PCP: Georgianne Fick, MD  Patient coming from: Home  Chief Complaint: Syncope  HPI: Jonathan Baird is a 89 y.o. male with medical history significant of mild dementia, hypertension, hyperlipidemia, insulin-dependent type 2 diabetes, CKD stage IV, PVD and right internal carotid artery stenosis on Xarelto, history of colon cancer status post hemicolectomy and chemotherapy in 2005 presented to the ED with complaints of syncope, cough, and vomiting.  Vital signs on arrival: Temperature 98.4 F, pulse 88, respiratory rate 18, blood pressure 170/72, and SpO2 98% on room air.  Labs notable for WBC count 16.6, hemoglobin 10.0, MCV 78.9, glucose 190, BUN 48, creatinine 3.1, initial lactic acid 2.3 and repeat pending, UA pending, magnesium 2.1, TSH normal, initial troponin negative and repeat pending, COVID/influenza/RSV PCR pending.  Chest x-ray showing bilateral lower lobe airspace opacities, left greater than right concerning for pneumonia. Patient was given ceftriaxone, azithromycin, and 1.5 L normal saline.  [facility] called to admit.  Patient is AAOx3 but appears slightly confused.  He knows the ambulance brought him here but is not sure why.  He does report passing out at home but is not able to give any additional details.  He reports 1 episode of vomiting and diarrhea at home earlier today but symptoms seem to have resolved.  He denies any abdominal pain.  He is reporting chills and poor appetite.  Denies cough, shortness of breath, or chest pain.  States he stays at home with his wife and daughter.  Review of Systems:  Review of Systems  All other systems reviewed and are negative.   Past Medical History:  Diagnosis Date   Carotid artery occlusion    Colon cancer (HCC) 10/12/2004   stage III   Diabetes mellitus     Past Surgical History:  Procedure Laterality Date   hemocolectomy  2005     reports  that he has never smoked. He has never used smokeless tobacco. He reports that he does not drink alcohol and does not use drugs.  No Known Allergies  Family History  Problem Relation Age of Onset   Cancer Mother    Diabetes Father    Cancer Sister     Prior to Admission medications   Medication Sig Start Date End Date Taking? Authorizing Provider  amLODipine (NORVASC) 10 MG tablet Take 10 mg by mouth daily. 03/05/18  Yes [provider]  aspirin (ASPIRIN 81) 81 MG chewable tablet 1 tablet   Yes [provider]  GVOKE HYPOPEN 2-PACK 1 MG/0.2ML SOAJ Inject 1 mg into the skin once as needed (for hypoglycemia). 02/08/22  Yes [provider]  hydrALAZINE (APRESOLINE) 25 MG tablet Take 50 mg by mouth 2 (two) times daily with a meal.   Yes [provider]  hydrochlorothiazide (HYDRODIURIL) 25 MG tablet Take 25 mg by mouth daily.   Yes [provider]  insulin lispro (HUMALOG KWIKPEN) 100 UNIT/ML KwikPen Inject 4 Units into the skin 3 (three) times daily after meals.   Yes [provider]  lisinopril (PRINIVIL,ZESTRIL) 40 MG tablet Take 40 mg by mouth daily.   Yes [provider]  rosuvastatin (CRESTOR) 10 MG tablet Take 10 mg by mouth daily.   Yes [provider]  sodium bicarbonate 650 MG tablet Take 650 mg by mouth daily. 07/16/15  Yes [provider]  XARELTO 2.5 MG TABS tablet TAKE 1 TABLET BY MOUTH TWICE A DAY. TAKE WITH  ASPIRIN 81MG  DAILY. 03/04/18  Yes [provider]  B-D ULTRAFINE III SHORT PEN 31G X 8 MM MISC Inject into the skin 2 (two) times daily. 10/26/20   [provider]  calcitRIOL (ROCALTROL) 0.25 MCG capsule  03/15/18   [provider]  Continuous Blood Gluc Sensor (FREESTYLE LIBRE 2 SENSOR) MISC See admin instructions.    [provider]  Dulaglutide (TRULICITY) 1.5 MG/0.5ML SOPN 1.5 mg Patient not taking: Reported on 10/19/2023    [provider]     Physical Exam: Vitals:   10/19/23 1842 10/19/23 1915 10/19/23 2145 10/19/23 2200  BP: (!) 170/72 (!) 145/88 (!) 156/83 (!) 161/69  Pulse: 88 89 90 87  Resp: 18 18 15  (!) 23  Temp: 98.4 F (36.9 C)     TempSrc: Oral     SpO2: 98% 96% 100% 100%  Weight:      Height:        Physical Exam Vitals reviewed.  Constitutional:      General: He is not in acute distress.    Appearance: He is ill-appearing.     Comments: Lethargic  HENT:     Head: Normocephalic and atraumatic.  Eyes:     Extraocular Movements: Extraocular movements intact.  Cardiovascular:     Rate and Rhythm: Normal rate and regular rhythm.     Pulses: Normal pulses.  Pulmonary:     Effort: Pulmonary effort is normal. No respiratory distress.     Breath sounds: No wheezing.     Comments: Mild bibasilar crackles Abdominal:     General: Bowel sounds are normal. There is no distension.     Palpations: Abdomen is soft.     Tenderness: There is no abdominal tenderness. There is no guarding or rebound.  Musculoskeletal:     Cervical back: Normal range of motion. No rigidity.     Right lower leg: No edema.     Left lower leg: No edema.  Skin:    General: Skin is warm and dry.  Neurological:     General: No focal deficit present.     Mental Status: He is alert and oriented to person, place, and time.     Cranial Nerves: No cranial nerve deficit.     Sensory: No sensory deficit.     Motor: No weakness.     Labs on Admission: I have personally reviewed following labs and imaging studies  CBC: Recent Labs  Lab 10/19/23 1854  WBC 16.6*  NEUTROABS 14.1*  HGB 10.0*  HCT 30.6*  MCV 78.9*  PLT 269   Basic Metabolic Panel: Recent Labs  Lab 10/19/23 1854  NA 138  K 4.7  CL 104  CO2 24  GLUCOSE 190*  BUN 48*  CREATININE 3.17*  CALCIUM 9.5  MG 2.1   GFR: Estimated Creatinine Clearance: 17.2 mL/min (A) (by C-G formula based on SCr of 3.17 mg/dL (H)). Liver Function Tests: Recent Labs  Lab  10/19/23 1854  AST 15  ALT 10  ALKPHOS 70  BILITOT 0.5  PROT 7.6  ALBUMIN 3.5   No results for input(s): "LIPASE", "AMYLASE" in the last 168 hours. No results for input(s): "AMMONIA" in the last 168 hours. Coagulation Profile: No results for input(s): "INR", "PROTIME" in the last 168 hours. Cardiac Enzymes: No results for input(s): "CKTOTAL", "CKMB", "CKMBINDEX", "TROPONINI" in the last 168 hours. BNP (last 3 results) No results for input(s): "PROBNP" in the last 8760 hours. HbA1C: No results for input(s): "HGBA1C" in the last 72  hours. CBG: No results for input(s): "GLUCAP" in the last 168 hours. Lipid Profile: No results for input(s): "CHOL", "HDL", "LDLCALC", "TRIG", "CHOLHDL", "LDLDIRECT" in the last 72 hours. Thyroid Function Tests: Recent Labs    10/19/23 1854  TSH 3.701   Anemia Panel: No results for input(s): "VITAMINB12", "FOLATE", "FERRITIN", "TIBC", "IRON", "RETICCTPCT" in the last 72 hours. Urine analysis:    Component Value Date/Time   COLORURINE STRAW (A) 04/26/2022 1710   APPEARANCEUR CLEAR 04/26/2022 1710   LABSPEC 1.010 04/26/2022 1710   PHURINE 7.0 04/26/2022 1710   GLUCOSEU NEGATIVE 04/26/2022 1710   HGBUR NEGATIVE 04/26/2022 1710   BILIRUBINUR NEGATIVE 04/26/2022 1710   KETONESUR NEGATIVE 04/26/2022 1710   PROTEINUR NEGATIVE 04/26/2022 1710   NITRITE NEGATIVE 04/26/2022 1710   LEUKOCYTESUR NEGATIVE 04/26/2022 1710    Radiological Exams on Admission: DG Chest 2 View  Result Date: 10/19/2023 CLINICAL DATA:  Syncope, fatigue EXAM: CHEST - 2 VIEW COMPARISON:  04/26/2022 FINDINGS: Patchy airspace disease in the lower lobes, left greater than right concerning for pneumonia. No effusions. Heart and mediastinal contours within normal limits. No acute bony abnormality. IMPRESSION: Bilateral lower lobe airspace opacities, left greater than right concerning for pneumonia. Electronically Signed   By: Charlett Nose M.D.   On: 10/19/2023 19:48    EKG:  Independently reviewed.  Sinus or ectopic atrial rhythm, PR prolongation, LVH.  Assessment and Plan  Community-acquired pneumonia ?Sepsis Chest x-ray showing bilateral lower lobe airspace opacities, left greater than right concerning for pneumonia.  Not hypoxic, currently satting in the high 90s on room air.  COVID/influenza/RSV PCR negative.  WBC count 16.6 but does not meet any other SIRS criteria.  Mild lactic acidosis resolved after IV fluids.  Not hypotensive.  Continue ceftriaxone and azithromycin.  Order sputum Gram stain and culture, strep pneumo and Legionella urinary antigens.  Trend WBC count.  Order blood cultures.  AKI on CKD stage IV Likely prerenal in etiology from dehydration/poor p.o. intake.  BUN 48, creatinine 3.1 (previously 2.4 on labs done 1.5 years ago and GFR was 25 at that time).  Kidney stones less likely as patient is not endorsing any abdominal or flank pain.  Continue gentle IV fluid hydration and monitor renal function.  Avoid nephrotoxic agents/contrast.  Hold home hydrochlorothiazide and lisinopril.  Vomiting and diarrhea Patient reports 1 episode of vomiting and diarrhea earlier today and symptoms seem to have resolved.  No elevation of LFTs.  No abdominal pain or tenderness, resting comfortably.  Testing negative for COVID and influenza.  Syncope Etiology unclear at this time.  Hold antihypertensives and check orthostatics.  Stat CT head ordered.  Continue cardiac monitoring and echocardiogram has been ordered.  Also given known history of carotid artery stenosis, carotid Dopplers ordered.  Acute encephalopathy Suspect this is related to dehydration/AKI and infection.  Does have mild dementia at baseline.  No fever or meningeal signs.  UA pending.  TSH normal.  Stat CT head ordered.  Delirium precautions.  Mild troponin elevation Troponin 15> 24, likely due to demand ischemia rather than ACS as patient denies chest pain.  EKG without acute ischemic changes.   Trend troponin.  Chronic microcytic anemia Hemoglobin currently 10.0, previously 10.8 on labs 1.5 years ago.  Patient is not endorsing any symptoms of GI bleed.  No other obvious bleeding.  Monitor H&H.  Check iron, ferritin, and TIBC.  Hypertension Hold antihypertensives until orthostatics checked.  Continue to hold hydrochlorothiazide and lisinopril given AKI.  IV hydralazine PRN SBP >180.  Hyperlipidemia Continue Crestor.  Insulin-dependent type 2 diabetes Glucose 190.  Last A1c 6.8 in July 2007, repeat ordered.  He is not on long-acting insulin at home.  Placed on sensitive sliding scale insulin ACHS.  History of PVD and right internal carotid artery stenosis Hold Xarelto until CT head is done.  Repeat carotid Dopplers ordered given syncopal event.  DVT prophylaxis: SCDs Code Status: Full Code (discussed with the patient) Family Communication: No family available at this time. Level of care: Telemetry bed Admission status: It is my clinical opinion that referral for OBSERVATION is reasonable and necessary in this patient based on the above information provided. The aforementioned taken together are felt to place the patient at high risk for further clinical deterioration. However, it is anticipated that the patient may be medically stable for discharge from the hospital within 24 to 48 hours.  John Giovanni MD [facility]  If 7PM-7AM, please contact night-coverage [email or web address]  10/19/2023, 10:20 PM

## 2023-10-19 NOTE — ED Notes (Signed)
Central monitoring notified for monitor setup again.

## 2023-10-19 NOTE — ED Triage Notes (Signed)
Pt BIB [facility] from home due to syncope.  General decline for the past two months and non-compliant with medicine.   Failure to thrive.  Pt vomited breakfast today and had syncope episode at home.  [facility] once arrived back to baseline.  VS BP 166/78, HR 86, Resp 18, SpO2 96% CBG 162

## 2023-10-20 ENCOUNTER — Other Ambulatory Visit (HOSPITAL_COMMUNITY): Payer: Medicare PPO

## 2023-10-20 ENCOUNTER — Observation Stay (HOSPITAL_COMMUNITY): Payer: Medicare PPO

## 2023-10-20 DIAGNOSIS — I2489 Other forms of acute ischemic heart disease: Secondary | ICD-10-CM | POA: Diagnosis present

## 2023-10-20 DIAGNOSIS — Z1152 Encounter for screening for COVID-19: Secondary | ICD-10-CM | POA: Diagnosis not present

## 2023-10-20 DIAGNOSIS — G459 Transient cerebral ischemic attack, unspecified: Secondary | ICD-10-CM | POA: Diagnosis not present

## 2023-10-20 DIAGNOSIS — N184 Chronic kidney disease, stage 4 (severe): Secondary | ICD-10-CM | POA: Diagnosis present

## 2023-10-20 DIAGNOSIS — G9341 Metabolic encephalopathy: Secondary | ICD-10-CM | POA: Diagnosis present

## 2023-10-20 DIAGNOSIS — B954 Other streptococcus as the cause of diseases classified elsewhere: Secondary | ICD-10-CM | POA: Diagnosis present

## 2023-10-20 DIAGNOSIS — Z7901 Long term (current) use of anticoagulants: Secondary | ICD-10-CM | POA: Diagnosis not present

## 2023-10-20 DIAGNOSIS — Z85038 Personal history of other malignant neoplasm of large intestine: Secondary | ICD-10-CM | POA: Diagnosis not present

## 2023-10-20 DIAGNOSIS — R55 Syncope and collapse: Secondary | ICD-10-CM | POA: Diagnosis present

## 2023-10-20 DIAGNOSIS — R42 Dizziness and giddiness: Secondary | ICD-10-CM

## 2023-10-20 DIAGNOSIS — E1151 Type 2 diabetes mellitus with diabetic peripheral angiopathy without gangrene: Secondary | ICD-10-CM | POA: Diagnosis present

## 2023-10-20 DIAGNOSIS — E782 Mixed hyperlipidemia: Secondary | ICD-10-CM | POA: Diagnosis present

## 2023-10-20 DIAGNOSIS — Z7401 Bed confinement status: Secondary | ICD-10-CM | POA: Diagnosis not present

## 2023-10-20 DIAGNOSIS — R197 Diarrhea, unspecified: Secondary | ICD-10-CM | POA: Diagnosis present

## 2023-10-20 DIAGNOSIS — I160 Hypertensive urgency: Secondary | ICD-10-CM | POA: Diagnosis present

## 2023-10-20 DIAGNOSIS — J189 Pneumonia, unspecified organism: Secondary | ICD-10-CM | POA: Diagnosis present

## 2023-10-20 DIAGNOSIS — Z9221 Personal history of antineoplastic chemotherapy: Secondary | ICD-10-CM | POA: Diagnosis not present

## 2023-10-20 DIAGNOSIS — Z794 Long term (current) use of insulin: Secondary | ICD-10-CM | POA: Diagnosis not present

## 2023-10-20 DIAGNOSIS — Z79899 Other long term (current) drug therapy: Secondary | ICD-10-CM | POA: Diagnosis not present

## 2023-10-20 DIAGNOSIS — I6782 Cerebral ischemia: Secondary | ICD-10-CM | POA: Diagnosis not present

## 2023-10-20 DIAGNOSIS — N390 Urinary tract infection, site not specified: Secondary | ICD-10-CM | POA: Diagnosis present

## 2023-10-20 DIAGNOSIS — I129 Hypertensive chronic kidney disease with stage 1 through stage 4 chronic kidney disease, or unspecified chronic kidney disease: Secondary | ICD-10-CM | POA: Diagnosis present

## 2023-10-20 DIAGNOSIS — D509 Iron deficiency anemia, unspecified: Secondary | ICD-10-CM | POA: Diagnosis present

## 2023-10-20 DIAGNOSIS — E872 Acidosis, unspecified: Secondary | ICD-10-CM | POA: Diagnosis present

## 2023-10-20 DIAGNOSIS — F03A0 Unspecified dementia, mild, without behavioral disturbance, psychotic disturbance, mood disturbance, and anxiety: Secondary | ICD-10-CM | POA: Diagnosis present

## 2023-10-20 DIAGNOSIS — E860 Dehydration: Secondary | ICD-10-CM | POA: Diagnosis present

## 2023-10-20 DIAGNOSIS — R531 Weakness: Secondary | ICD-10-CM | POA: Diagnosis not present

## 2023-10-20 DIAGNOSIS — Z7984 Long term (current) use of oral hypoglycemic drugs: Secondary | ICD-10-CM | POA: Diagnosis not present

## 2023-10-20 DIAGNOSIS — G9389 Other specified disorders of brain: Secondary | ICD-10-CM | POA: Diagnosis not present

## 2023-10-20 DIAGNOSIS — N179 Acute kidney failure, unspecified: Secondary | ICD-10-CM | POA: Diagnosis present

## 2023-10-20 DIAGNOSIS — E1122 Type 2 diabetes mellitus with diabetic chronic kidney disease: Secondary | ICD-10-CM | POA: Diagnosis present

## 2023-10-20 LAB — CBC
HCT: 26.2 % — ABNORMAL LOW (ref 39.0–52.0)
Hemoglobin: 8.6 g/dL — ABNORMAL LOW (ref 13.0–17.0)
MCH: 25.5 pg — ABNORMAL LOW (ref 26.0–34.0)
MCHC: 32.8 g/dL (ref 30.0–36.0)
MCV: 77.7 fL — ABNORMAL LOW (ref 80.0–100.0)
Platelets: 235 10*3/uL (ref 150–400)
RBC: 3.37 MIL/uL — ABNORMAL LOW (ref 4.22–5.81)
RDW: 14.9 % (ref 11.5–15.5)
WBC: 14.4 10*3/uL — ABNORMAL HIGH (ref 4.0–10.5)
nRBC: 0 % (ref 0.0–0.2)

## 2023-10-20 LAB — URINALYSIS, W/ REFLEX TO CULTURE (INFECTION SUSPECTED)
Bilirubin Urine: NEGATIVE
Glucose, UA: 150 mg/dL — AB
Ketones, ur: NEGATIVE mg/dL
Nitrite: NEGATIVE
Protein, ur: 30 mg/dL — AB
Specific Gravity, Urine: 1.009 (ref 1.005–1.030)
WBC, UA: >50 WBC/hpf (ref 0–5)
pH: 5 (ref 5.0–8.0)

## 2023-10-20 LAB — CBG MONITORING, ED
Glucose-Capillary: 186 mg/dL — ABNORMAL HIGH (ref 70–99)
Glucose-Capillary: 183 mg/dL — ABNORMAL HIGH (ref 70–99)
Glucose-Capillary: 126 mg/dL — ABNORMAL HIGH (ref 70–99)
Glucose-Capillary: 139 mg/dL — ABNORMAL HIGH (ref 70–99)

## 2023-10-20 LAB — TROPONIN I (HIGH SENSITIVITY)
Troponin I (High Sensitivity): 30 ng/L — ABNORMAL HIGH (ref <18)
Troponin I (High Sensitivity): 26 ng/L — ABNORMAL HIGH (ref <18)

## 2023-10-20 LAB — ECHOCARDIOGRAM COMPLETE
AR max vel: 2.95 cm2
AV Area VTI: 3.18 cm2
AV Area mean vel: 3.19 cm2
AV Mean grad: 4 mm[Hg]
AV Peak grad: 6.8 mm[Hg]
Ao pk vel: 1.3 m/s
Area-P 1/2: 2.63 cm2
Height: 71 in
S' Lateral: 2.9 cm
Weight: 2688 [oz_av]

## 2023-10-20 LAB — BASIC METABOLIC PANEL
Anion gap: 8 (ref 5–15)
BUN: 45 mg/dL — ABNORMAL HIGH (ref 8–23)
CO2: 22 mmol/L (ref 22–32)
Calcium: 8.9 mg/dL (ref 8.9–10.3)
Chloride: 107 mmol/L (ref 98–111)
Creatinine, Ser: 2.88 mg/dL — ABNORMAL HIGH (ref 0.61–1.24)
GFR, Estimated: 20 mL/min — ABNORMAL LOW (ref >60)
Glucose, Bld: 196 mg/dL — ABNORMAL HIGH (ref 70–99)
Potassium: 4.3 mmol/L (ref 3.5–5.1)
Sodium: 137 mmol/L (ref 135–145)

## 2023-10-20 LAB — GLUCOSE, CAPILLARY: Glucose-Capillary: 145 mg/dL — ABNORMAL HIGH (ref 70–99)

## 2023-10-20 LAB — IRON AND TIBC
Iron: 19 ug/dL — ABNORMAL LOW (ref 45–182)
Saturation Ratios: 8 % — ABNORMAL LOW (ref 17.9–39.5)
TIBC: 245 ug/dL — ABNORMAL LOW (ref 250–450)
UIBC: 226 ug/dL

## 2023-10-20 LAB — FERRITIN: Ferritin: 65 ng/mL (ref 24–336)

## 2023-10-20 LAB — VITAMIN B12: Vitamin B-12: 395 pg/mL (ref 180–914)

## 2023-10-20 MED ORDER — HYDRALAZINE HCL 50 MG PO TABS
50.0000 mg | ORAL_TABLET | Freq: Three times a day (TID) | ORAL | Status: DC
  Administered 2023-10-20 – 2023-10-22 (×7): 50 mg via ORAL
  Filled 2023-10-20 (×7): qty 1

## 2023-10-20 MED ORDER — AMLODIPINE BESYLATE 10 MG PO TABS
10.0000 mg | ORAL_TABLET | Freq: Every day | ORAL | Status: DC
  Administered 2023-10-20 – 2023-10-25 (×6): 10 mg via ORAL
  Filled 2023-10-20 (×6): qty 1
  Filled 2023-10-20: qty 2

## 2023-10-20 MED ORDER — IPRATROPIUM-ALBUTEROL 0.5-2.5 (3) MG/3ML IN SOLN: 3.0000 mL | RESPIRATORY_TRACT | Status: DC | PRN

## 2023-10-20 MED ORDER — RIVAROXABAN 2.5 MG PO TABS
2.5000 mg | ORAL_TABLET | Freq: Two times a day (BID) | ORAL | Status: DC
  Administered 2023-10-20 – 2023-10-25 (×11): 2.5 mg via ORAL
  Filled 2023-10-20 (×12): qty 1

## 2023-10-20 MED ORDER — METOPROLOL TARTRATE 5 MG/5ML IV SOLN: 5.0000 mg | INTRAVENOUS | Status: DC | PRN

## 2023-10-20 MED ORDER — ASPIRIN 81 MG PO CHEW
81.0000 mg | CHEWABLE_TABLET | Freq: Every day | ORAL | Status: DC
  Administered 2023-10-20 – 2023-10-25 (×6): 81 mg via ORAL
  Filled 2023-10-20 (×7): qty 1

## 2023-10-20 MED ORDER — GUAIFENESIN 100 MG/5ML PO LIQD: 5.0000 mL | ORAL | Status: DC | PRN

## 2023-10-20 MED ORDER — TRAZODONE HCL 50 MG PO TABS
50.0000 mg | ORAL_TABLET | Freq: Every evening | ORAL | Status: DC | PRN
  Administered 2023-10-22: 50 mg via ORAL
  Filled 2023-10-20: qty 1

## 2023-10-20 MED ORDER — ONDANSETRON HCL 4 MG/2ML IJ SOLN: 4.0000 mg | Freq: Four times a day (QID) | INTRAMUSCULAR | Status: DC | PRN

## 2023-10-20 MED ORDER — HYDRALAZINE HCL 20 MG/ML IJ SOLN: 10.0000 mg | INTRAMUSCULAR | Status: DC | PRN

## 2023-10-20 MED ORDER — HYDRALAZINE HCL 50 MG PO TABS: 50.0000 mg | ORAL_TABLET | Freq: Two times a day (BID) | ORAL | Status: DC

## 2023-10-20 MED ORDER — SODIUM CHLORIDE 0.9 % IV SOLN: INTRAVENOUS | Status: AC

## 2023-10-20 MED ORDER — SENNOSIDES-DOCUSATE SODIUM 8.6-50 MG PO TABS: 1.0000 | ORAL_TABLET | Freq: Every evening | ORAL | Status: DC | PRN

## 2023-10-20 NOTE — Progress Notes (Signed)
PROGRESS NOTE    Jonathan Baird  WGN:[id number] DOB: 07-17-1933 DOA: 10/19/2023 PCP: Georgianne Fick, MD    Brief Narrative:  89 year old with history of dementia, HTN, HLD, DM2, CKD 4, PVD, right internal carotid stenosis on Xarelto, colon cancer status post hemicolectomy and chemotherapy in 2005 comes to the ED with complaints of syncope, cough and vomiting.  Chest x-ray shows concerns of possible bilateral pneumonia.  Started on Rocephin, azithromycin.   Assessment & Plan:  Principal Problem:   CAP (community acquired pneumonia) Active Problems:   Essential hypertension   Mixed hyperlipidemia   Syncope   Acute renal failure superimposed on stage 4 chronic kidney disease (HCC)   Acute encephalopathy   Elevated troponin   Type 2 diabetes mellitus (HCC)     Community-acquired pneumonia Urinary tract infection Chest x-ray showing bilateral pneumonia.  COVID/RSV/flu are negative.  Sepsis physiology slowly improving.  Currently on Rocephin and azithromycin.  Follow urine cultures.   AKI on CKD stage IV Baseline creatinine around 2.5, admission creatinine 3.17 slowly improving.  Continue IV fluids.  Holding home HCTZ and lisinopril    Vomiting and diarrhea No further episodes.  Will closely monitor this   Syncope Unclear etiology.  Possible dehydration.  CT head negative.  Echocardiogram and carotid Dopplers ordered PT/OT.   Acute encephalopathy Possibly from dehydration underlying infection with probably underlying dementia.  TSH, CT head negative.    Mild troponin elevation Troponins peaked at 30.  Demand ischemia.  No further workup besides echocardiogram   Chronic microcytic anemia with iron deficiency Hemoglobin is currently stable around baseline of 9.0.  Microcytosis noted with iron deficiency.  Will start on p.o. supplements with bowel regimen   Hypertension Holding home HCTZ/lisinopril due to AKI Temporarily will change hydralazine to 3 times  daily Continue Norvasc 10 mg daily IV as needed   Hyperlipidemia Continue Crestor.   Insulin-dependent type 2 diabetes A1c ordered.  He is not on long-acting insulin at home.  Placed on sensitive sliding scale insulin ACHS.   History of PVD and right internal carotid artery stenosis Resume home Xarelto (should really be on eliquis due to abnormal Renal Fx).  Repeat carotid Dopplers ordered given syncopal event.  DVT prophylaxis: SCDs Start: 10/19/23 2343 Code Status: Full Family Communication:   Cont hosp stay for eval of Syncope Uti, Cap.     Subjective: Seen at bedside, still feels overall weak At home ambulates with the help of walker and cane.   Examination: , General exam: Appears calm and comfortable, elderly frail Respiratory system: Clear to auscultation. Respiratory effort normal. Cardiovascular system: S1 & S2 heard, RRR. No JVD, murmurs, rubs, gallops or clicks. No pedal edema. Gastrointestinal system: Abdomen is nondistended, soft and nontender. No organomegaly or masses felt. Normal bowel sounds heard. Central nervous system: Alert and oriented. No focal neurological deficits. Extremities: Symmetric 5 x 5 power. Skin: No rashes, lesions or ulcers Psychiatry: Judgement and insight appear normal. Mood & affect appropriate.                Diet Orders (From admission, onward)     Start     Ordered   10/19/23 2343  Diet heart healthy/carb modified Room service appropriate? Yes; Fluid consistency: Thin  Diet effective now       Question Answer Comment  Diet-HS Snack? Nothing   Room service appropriate? Yes   Fluid consistency: Thin      10/19/23 2349  Objective: Vitals:   10/20/23 0800 10/20/23 0837 10/20/23 0900 10/20/23 1000  BP: (!) 158/81  (!) 166/61 (!) 162/55  Pulse: 72  69   Resp: 15  11   Temp:  98.7 F (37.1 C)    TempSrc:  Oral    SpO2: 100%  100%   Weight:      Height:        Intake/Output Summary (Last 24  hours) at 10/20/2023 1145 Last data filed at 10/19/2023 2217 Gross per 24 hour  Intake 600 ml  Output --  Net 600 ml   Filed Weights   10/19/23 1836  Weight: 76.2 kg    Scheduled Meds:  amLODipine  10 mg Oral Daily   aspirin  81 mg Oral Daily   hydrALAZINE  50 mg Oral TID   insulin aspart  0-5 Units Subcutaneous QHS   insulin aspart  0-9 Units Subcutaneous TID WC   rivaroxaban  2.5 mg Oral BID   rosuvastatin  10 mg Oral Daily   sodium bicarbonate  650 mg Oral Daily   Continuous Infusions:  sodium chloride     azithromycin     cefTRIAXone (ROCEPHIN)  IV      Nutritional status     Body mass index is 23.43 kg/m.  Data Reviewed:   CBC: Recent Labs  Lab 10/19/23 1854 10/20/23 0125  WBC 16.6* 14.4*  NEUTROABS 14.1*  --   HGB 10.0* 8.6*  HCT 30.6* 26.2*  MCV 78.9* 77.7*  PLT 269 235   Basic Metabolic Panel: Recent Labs  Lab 10/19/23 1854 10/20/23 0125  NA 138 137  K 4.7 4.3  CL 104 107  CO2 24 22  GLUCOSE 190* 196*  BUN 48* 45*  CREATININE 3.17* 2.88*  CALCIUM 9.5 8.9  MG 2.1  --    GFR: Estimated Creatinine Clearance: 18.9 mL/min (A) (by C-G formula based on SCr of 2.88 mg/dL (H)). Liver Function Tests: Recent Labs  Lab 10/19/23 1854  AST 15  ALT 10  ALKPHOS 70  BILITOT 0.5  PROT 7.6  ALBUMIN 3.5   No results for input(s): "LIPASE", "AMYLASE" in the last 168 hours. No results for input(s): "AMMONIA" in the last 168 hours. Coagulation Profile: No results for input(s): "INR", "PROTIME" in the last 168 hours. Cardiac Enzymes: No results for input(s): "CKTOTAL", "CKMB", "CKMBINDEX", "TROPONINI" in the last 168 hours. BNP (last 3 results) No results for input(s): "PROBNP" in the last 8760 hours. HbA1C: No results for input(s): "HGBA1C" in the last 72 hours. CBG: Recent Labs  Lab 10/20/23 0144 10/20/23 0823 10/20/23 1140  GLUCAP 186* 183* 126*   Lipid Profile: No results for input(s): "CHOL", "HDL", "LDLCALC", "TRIG", "CHOLHDL",  "LDLDIRECT" in the last 72 hours. Thyroid Function Tests: Recent Labs    10/19/23 1854  TSH 3.701   Anemia Panel: Recent Labs    10/20/23 0125  FERRITIN 65  TIBC 245*  IRON 19*   Sepsis Labs: Recent Labs  Lab 10/19/23 1851 10/19/23 2102  LATICACIDVEN 2.3* 1.2    Recent Results (from the past 240 hour(s))  Resp panel by RT-PCR (RSV, Flu A&B, Covid) Anterior Nasal Swab     Status: None   Collection Time: 10/19/23  9:02 PM   Specimen: Anterior Nasal Swab  Result Value Ref Range Status   SARS Coronavirus 2 by RT PCR NEGATIVE NEGATIVE Final   Influenza A by PCR NEGATIVE NEGATIVE Final   Influenza B by PCR NEGATIVE NEGATIVE Final    Comment: (NOTE)  The Xpert Xpress SARS-CoV-2/FLU/RSV plus assay is intended as an aid in the diagnosis of influenza from Nasopharyngeal swab specimens and should not be used as a sole basis for treatment. Nasal washings and aspirates are unacceptable for Xpert Xpress SARS-CoV-2/FLU/RSV testing.  Fact Sheet for Patients: BloggerCourse.com  Fact Sheet for Healthcare Providers: SeriousBroker.it  This test is not yet approved or cleared by the [place] FDA and has been authorized for detection and/or diagnosis of SARS-CoV-2 by FDA under an Emergency Use Authorization (EUA). This EUA will remain in effect (meaning this test can be used) for the duration of the COVID-19 declaration under Section 564(b)(1) of the Act, 21 U.S.C. section 360bbb-3(b)(1), unless the authorization is terminated or revoked.     Resp Syncytial Virus by PCR NEGATIVE NEGATIVE Final    Comment: (NOTE) Fact Sheet for Patients: BloggerCourse.com  Fact Sheet for Healthcare Providers: SeriousBroker.it  This test is not yet approved or cleared by the [place] FDA and has been authorized for detection and/or diagnosis of SARS-CoV-2 by FDA under an Emergency Use  Authorization (EUA). This EUA will remain in effect (meaning this test can be used) for the duration of the COVID-19 declaration under Section 564(b)(1) of the Act, 21 U.S.C. section 360bbb-3(b)(1), unless the authorization is terminated or revoked.  Performed at [facility], [address], [place], [place] [address]          Radiology Studies: VAS US CAROTID  Result Date: 10/20/2023 Carotid Arterial Duplex Study Patient Name:  Jonathan Baird  Date of Exam:   10/20/2023 Medical Rec #: [id number]        Accession #:    [id number] Date of Birth: Oct 03, 1933       Patient Gender: M Patient Age:   36 years Exam Location:  [facility] Procedure:      VAS US CAROTID Referring Phys: Ulyess Blossom RATHORE --------------------------------------------------------------------------------  Indications:       Syncope. Risk Factors:      Hypertension, hyperlipidemia, Diabetes. Other Factors:     CKD IV. Limitations        Today's exam was limited due to constant movement of head,                    the patient's inability or unwillingness to cooperate and                    heavy calcification and the resulting shadowing. Comparison Study:  Prior carotid duplex done 11/02/21 indicating 40-59% right                    ICA stenosis and 1-39% left ICA stenosis. Performing Technologist: Sherren Kerns RVS  Examination Guidelines: A complete evaluation includes B-mode imaging, spectral Doppler, color Doppler, and power Doppler as needed of all accessible portions of each vessel. Bilateral testing is considered an integral part of a complete examination. Limited examinations for reoccurring indications may be performed as noted.  Right Carotid Findings: +----------+--------+--------+--------+------------------+---------+           PSV cm/sEDV cm/sStenosisPlaque DescriptionComments  +----------+--------+--------+--------+------------------+---------+ CCA Prox  87      10              homogeneous                  +----------+--------+--------+--------+------------------+---------+ CCA Distal121     12              homogeneous                 +----------+--------+--------+--------+------------------+---------+  ICA Prox  351     37      40-59%  calcific          Shadowing +----------+--------+--------+--------+------------------+---------+ ICA Mid   286     23                                tortuous  +----------+--------+--------+--------+------------------+---------+ ICA Distal81      15                                tortuous  +----------+--------+--------+--------+------------------+---------+ ECA       192     28                                          +----------+--------+--------+--------+------------------+---------+ +----------+--------+-------+--------+-------------------+           PSV cm/sEDV cmsDescribeArm Pressure (mmHG) +----------+--------+-------+--------+-------------------+ [id number]                                         +----------+--------+-------+--------+-------------------+ +---------+--------+--+--------+--+ VertebralPSV cm/s85EDV cm/s10 +---------+--------+--+--------+--+  Left Carotid Findings: +----------+--------+--------+--------+------------------+--------+           PSV cm/sEDV cm/sStenosisPlaque DescriptionComments +----------+--------+--------+--------+------------------+--------+ CCA Prox  140     17              heterogenous               +----------+--------+--------+--------+------------------+--------+ CCA Distal150     13              heterogenous               +----------+--------+--------+--------+------------------+--------+ ICA Prox  159     13      1-39%                              +----------+--------+--------+--------+------------------+--------+ ICA Mid   112     20                                tortuous +----------+--------+--------+--------+------------------+--------+  ICA Distal111     16                                tortuous +----------+--------+--------+--------+------------------+--------+ ECA       147     0                                          +----------+--------+--------+--------+------------------+--------+ +----------+--------+--------+--------+-------------------+           PSV cm/sEDV cm/sDescribeArm Pressure (mmHG) +----------+--------+--------+--------+-------------------+ Subclavian210                                         +----------+--------+--------+--------+-------------------+ +---------+--------+--+--------+--+ VertebralPSV cm/s66EDV cm/s11 +---------+--------+--+--------+--+   Summary: Right Carotid: Velocities in the right ICA are consistent with a 40-59%  stenosis. Higher velocities may be obscured by calcification. Left Carotid: Velocities in the left ICA are consistent with a 1-39% stenosis. Vertebrals:  Bilateral vertebral arteries demonstrate antegrade flow. Subclavians: Normal flow hemodynamics were seen in bilateral subclavian              arteries. *See table(s) above for measurements and observations.     Preliminary    ECHOCARDIOGRAM COMPLETE  Result Date: 10/20/2023    ECHOCARDIOGRAM REPORT   Patient Name:   Jonathan Baird Date of Exam: 10/20/2023 Medical Rec #:  [id number]       Height:       71.0 in Accession #:    [id number]      Weight:       168.0 lb Date of Birth:  1933-08-16      BSA:          1.958 m Patient Age:    89 years        BP:           158/81 mmHg Patient Gender: M               HR:           72 bpm. Exam Location:  Inpatient Procedure: 2D Echo, Color Doppler and Cardiac Doppler Indications:    Syncope  History:        Patient has no prior history of Echocardiogram examinations.                 Elevated Troponin, CKD and Carotid Disease,                 Signs/Symptoms:Syncope; Risk Factors:Diabetes, Dyslipidemia and                 Hypertension.  Sonographer:    Milbert Coulter Referring Phys: [id number] VASUNDHRA RATHORE IMPRESSIONS  1. Left ventricular ejection fraction, by estimation, is 60 to 65%. The left ventricle has normal function. The left ventricle has no regional wall motion abnormalities. There is moderate left ventricular hypertrophy. Left ventricular diastolic parameters are consistent with Grade I diastolic dysfunction (impaired relaxation).  2. Right ventricular systolic function is normal. The right ventricular size is normal.  3. Left atrial size was mildly dilated.  4. The mitral valve is abnormal. No evidence of mitral valve regurgitation. No evidence of mitral stenosis. Moderate mitral annular calcification.  5. The aortic valve is tricuspid. There is moderate calcification of the aortic valve. There is moderate thickening of the aortic valve. Aortic valve regurgitation is not visualized. Aortic valve sclerosis is present, with no evidence of aortic valve stenosis.  6. The inferior vena cava is dilated in size with >50% respiratory variability, suggesting right atrial pressure of 8 mmHg. FINDINGS  Left Ventricle: Left ventricular ejection fraction, by estimation, is 60 to 65%. The left ventricle has normal function. The left ventricle has no regional wall motion abnormalities. The left ventricular internal cavity size was normal in size. There is  moderate left ventricular hypertrophy. Left ventricular diastolic parameters are consistent with Grade I diastolic dysfunction (impaired relaxation). Right Ventricle: The right ventricular size is normal. No increase in right ventricular wall thickness. Right ventricular systolic function is normal. Left Atrium: Left atrial size was mildly dilated. Right Atrium: Right atrial size was normal in size. Pericardium: There is no evidence of pericardial effusion. Mitral Valve: The mitral valve is abnormal. There is mild thickening of the mitral valve leaflet(s). There is mild calcification of the mitral valve leaflet(s).  Moderate mitral annular calcification. No evidence of mitral valve regurgitation. No evidence  of mitral valve stenosis. Tricuspid Valve: The tricuspid valve is normal in structure. Tricuspid valve regurgitation is not demonstrated. No evidence of tricuspid stenosis. Aortic Valve: The aortic valve is tricuspid. There is moderate calcification of the aortic valve. There is moderate thickening of the aortic valve. Aortic valve regurgitation is not visualized. Aortic valve sclerosis is present, with no evidence of aortic valve stenosis. Aortic valve mean gradient measures 4.0 mmHg. Aortic valve peak gradient measures 6.8 mmHg. Aortic valve area, by VTI measures 3.18 cm. Pulmonic Valve: The pulmonic valve was normal in structure. Pulmonic valve regurgitation is not visualized. No evidence of pulmonic stenosis. Aorta: The aortic root is normal in size and structure. Venous: The inferior vena cava is dilated in size with greater than 50% respiratory variability, suggesting right atrial pressure of 8 mmHg. IAS/Shunts: No atrial level shunt detected by color flow Doppler.  LEFT VENTRICLE PLAX 2D LVIDd:         4.30 cm   Diastology LVIDs:         2.90 cm   LV e' medial:    7.62 cm/s LV PW:         1.50 cm   LV E/e' medial:  9.8 LV IVS:        1.50 cm   LV e' lateral:   11.20 cm/s LVOT diam:     2.00 cm   LV E/e' lateral: 6.7 LV SV:         96 LV SV Index:   49 LVOT Area:     3.14 cm  RIGHT VENTRICLE RV Basal diam:  3.40 cm RV S prime:     11.30 cm/s TAPSE (M-mode): 2.2 cm LEFT ATRIUM             Index        RIGHT ATRIUM           Index LA diam:        4.00 cm 2.04 cm/m   RA Area:     11.40 cm LA Vol (A2C):   69.2 ml 35.34 ml/m  RA Volume:   26.20 ml  13.38 ml/m LA Vol (A4C):   28.2 ml 14.40 ml/m LA Biplane Vol: 48.8 ml 24.92 ml/m  AORTIC VALVE AV Area (Vmax):    2.95 cm AV Area (Vmean):   3.19 cm AV Area (VTI):     3.18 cm AV Vmax:           130.00 cm/s AV Vmean:          88.200 cm/s AV VTI:            0.303 m AV  Peak Grad:      6.8 mmHg AV Mean Grad:      4.0 mmHg LVOT Vmax:         122.00 cm/s LVOT Vmean:        89.600 cm/s LVOT VTI:          0.307 m LVOT/AV VTI ratio: 1.01  AORTA Ao Root diam: 3.40 cm Ao Asc diam:  3.40 cm MITRAL VALVE MV Area (PHT): 2.63 cm     SHUNTS MV Decel Time: 288 msec     Systemic VTI:  0.31 m MV E velocity: 74.90 cm/s   Systemic Diam: 2.00 cm MV A velocity: 117.00 cm/s MV E/A ratio:  0.64 Charlton Haws MD Electronically signed by Charlton Haws MD Signature Date/Time: 10/20/2023/10:23:12 AM  Final    CT HEAD WO CONTRAST ( )  Result Date: 10/20/2023 CLINICAL DATA:  Recent syncopal episode EXAM: CT HEAD WITHOUT CONTRAST TECHNIQUE: Contiguous axial images were obtained from the base of the skull through the vertex without intravenous contrast. RADIATION DOSE REDUCTION: This exam was performed according to the departmental dose-optimization program which includes automated exposure control, adjustment of the mA and/or kV according to patient size and/or use of iterative reconstruction technique. COMPARISON:  04/26/2022 FINDINGS: Brain: No evidence of acute infarction, hemorrhage, hydrocephalus, extra-axial collection or mass lesion/mass effect. Chronic atrophic and ischemic changes are noted with evidence of ventricular dilatation. Vascular: No hyperdense vessel or unexpected calcification. Skull: Normal. Negative for fracture or focal lesion. Sinuses/Orbits: No acute finding. Other: None. IMPRESSION: Chronic atrophic and ischemic changes similar to that seen on prior exam. Stable ventricular dilatation Electronically Signed   By: Alcide Clever M.D.   On: 10/20/2023 00:22   DG Chest 2 View  Result Date: 10/19/2023 CLINICAL DATA:  Syncope, fatigue EXAM: CHEST - 2 VIEW COMPARISON:  04/26/2022 FINDINGS: Patchy airspace disease in the lower lobes, left greater than right concerning for pneumonia. No effusions. Heart and mediastinal contours within normal limits. No acute bony abnormality.  IMPRESSION: Bilateral lower lobe airspace opacities, left greater than right concerning for pneumonia. Electronically Signed   By: Charlett Nose M.D.   On: 10/19/2023 19:48           LOS: 0 days   Time spent= 35 mins    Miguel Rota, MD [facility]  If 7PM-7AM, please contact night-coverage  10/20/2023, 11:45 AM

## 2023-10-20 NOTE — ED Notes (Signed)
Complete bed change. New linens, gown and nonslip footwear.   Condom catheter placed.   Comforting measures implemented.   Oral hydration provided.

## 2023-10-20 NOTE — ED Notes (Signed)
ED TO INPATIENT HANDOFF REPORT  ED Nurse Name and Phone #: Berna Spare [phone]  S Name/Age/Gender Peyton Najjar 89 y.o. male Room/Bed: 044C/044C  Code Status   Code Status: Full Code  Home/SNF/Other Home Patient oriented to: self, place, time, and situation Is this baseline? Yes   Triage Complete: Triage complete  Chief Complaint Syncope [R55]  Triage Note Pt BIB [facility] from home due to syncope.  General decline for the past two months and non-compliant with medicine.   Failure to thrive.  Pt vomited breakfast today and had syncope episode at home.  [facility] once arrived back to baseline.  VS BP 166/78, HR 86, Resp 18, SpO2 96% CBG 162   Allergies No Known Allergies  Level of Care/Admitting Diagnosis ED Disposition     ED Disposition  Admit   Condition  --   Comment  Hospital Area: [facility] [[id number]]  Level of Care: Telemetry Cardiac [103]  May admit patient to [facility] or [facility] if equivalent level of care is available:: Yes  Covid Evaluation: Asymptomatic - no recent exposure (last 10 days) testing not required  Diagnosis: Syncope [206001]  Admitting Physician: John Giovanni [[id number]]  Attending Physician: Miguel Rota [phone]  Certification:: I certify this patient will need inpatient services for at least 2 midnights          B Medical/Surgery History Past Medical History:  Diagnosis Date   Carotid artery occlusion    Colon cancer (HCC) 10/12/2004   stage III   Diabetes mellitus    Past Surgical History:  Procedure Laterality Date   hemocolectomy  2005     A IV Location/Drains/Wounds Patient Lines/Drains/Airways Status     Active Line/Drains/Airways     Name Placement date Placement time Site Days   Peripheral IV 10/20/23 20 G 1" Anterior;Proximal;Right Forearm 10/20/23  0208  Forearm  less than 1   External Urinary Catheter 10/20/23  0220  --  less than 1            Intake/Output Last 24  hours  Intake/Output Summary (Last 24 hours) at 10/20/2023 1632 Last data filed at 10/19/2023 2217 Gross per 24 hour  Intake 600 ml  Output --  Net 600 ml    Labs/Imaging Results for orders placed or performed during the hospital encounter of 10/19/23 (from the past 48 hour(s))  Lactic acid, plasma     Status: Abnormal   Collection Time: 10/19/23  6:51 PM  Result Value Ref Range   Lactic Acid, Venous 2.3 (HH) 0.5 - 1.9 mmol/L    Comment: CRITICAL RESULT CALLED TO, READ BACK BY AND VERIFIED WITH L,NORDELL RN @2023  10/19/23 E,BENTON Performed at [facility], [address] [place], [place] [address]   CBC with Differential     Status: Abnormal   Collection Time: 10/19/23  6:54 PM  Result Value Ref Range   WBC 16.6 (H) 4.0 - 10.5 K/uL   RBC 3.88 (L) 4.22 - 5.81 MIL/uL   Hemoglobin 10.0 (L) 13.0 - 17.0 g/dL   HCT 30.8 (L) 65.7 - 84.6 %   MCV 78.9 (L) 80.0 - 100.0 fL   MCH 25.8 (L) 26.0 - 34.0 pg   MCHC 32.7 30.0 - 36.0 g/dL   RDW 96.2 95.2 - 84.1 %   Platelets 269 150 - 400 K/uL   nRBC 0.0 0.0 - 0.2 %   Neutrophils Relative % 85 %   Neutro Abs 14.1 (H) 1.7 - 7.7 K/uL  Lymphocytes Relative 7 %   Lymphs Abs 1.1 0.7 - 4.0 K/uL   Monocytes Relative 7 %   Monocytes Absolute 1.1 (H) 0.1 - 1.0 K/uL   Eosinophils Relative 1 %   Eosinophils Absolute 0.1 0.0 - 0.5 K/uL   Basophils Relative 0 %   Basophils Absolute 0.0 0.0 - 0.1 K/uL   Immature Granulocytes 0 %   Abs Immature Granulocytes 0.07 0.00 - 0.07 K/uL    Comment: Performed at [facility], [address], [place], [place] [address]  Comprehensive metabolic panel     Status: Abnormal   Collection Time: 10/19/23  6:54 PM  Result Value Ref Range   Sodium 138 135 - 145 mmol/L   Potassium 4.7 3.5 - 5.1 mmol/L   Chloride 104 98 - 111 mmol/L   CO2 24 22 - 32 mmol/L   Glucose, Bld 190 (H) 70 - 99 mg/dL    Comment: Glucose reference range applies only to samples taken after fasting for at least 8 hours.   BUN 48 (H)  8 - 23 mg/dL   Creatinine, Ser 5.18 (H) 0.61 - 1.24 mg/dL   Calcium 9.5 8.9 - 84.1 mg/dL   Total Protein 7.6 6.5 - 8.1 g/dL   Albumin 3.5 3.5 - 5.0 g/dL   AST 15 15 - 41 U/L   ALT 10 0 - 44 U/L   Alkaline Phosphatase 70 38 - 126 U/L   Total Bilirubin 0.5 <1.2 mg/dL   GFR, Estimated 18 (L) >60 mL/min    Comment: (NOTE) Calculated using the CKD-EPI Creatinine Equation (2021)    Anion gap 10 5 - 15    Comment: Performed at [facility], [address], [place], [place] [address]  Magnesium     Status: None   Collection Time: 10/19/23  6:54 PM  Result Value Ref Range   Magnesium 2.1 1.7 - 2.4 mg/dL    Comment: Performed at [facility], [address], [place], [place] [address]  TSH     Status: None   Collection Time: 10/19/23  6:54 PM  Result Value Ref Range   TSH 3.701 0.350 - 4.500 uIU/mL    Comment: Performed by a 3rd Generation assay with a functional sensitivity of <=0.01 uIU/mL. Performed at [facility], [address] [place], [place] [address]   Troponin I (High Sensitivity)     Status: None   Collection Time: 10/19/23  6:54 PM  Result Value Ref Range   Troponin I (High Sensitivity) 15 <18 ng/L    Comment: (NOTE) Elevated high sensitivity troponin I (hsTnI) values and significant  changes across serial measurements may suggest ACS but many other  chronic and acute conditions are known to elevate hsTnI results.  Refer to the "Links" section for chest pain algorithms and additional  guidance. Performed at [facility], [address], [place], [place] [address]   Lactic acid, plasma     Status: None   Collection Time: 10/19/23  9:02 PM  Result Value Ref Range   Lactic Acid, Venous 1.2 0.5 - 1.9 mmol/L    Comment: Performed at [facility], [address], [place], [place] [address]  Troponin I (High Sensitivity)     Status: Abnormal   Collection Time: 10/19/23  9:02 PM  Result Value Ref Range   Troponin I (High Sensitivity) 24 (H)  <18 ng/L    Comment: (NOTE) Elevated high sensitivity troponin I (hsTnI) values and  significant  changes across serial measurements may suggest ACS but many other  chronic and acute conditions are known to elevate hsTnI results.  Refer to the "Links" section for chest pain algorithms and additional  guidance. Performed at [facility], [address] [place], [place] [address]   Resp panel by RT-PCR (RSV, Flu A&B, Covid) Anterior Nasal Swab     Status: None   Collection Time: 10/19/23  9:02 PM   Specimen: Anterior Nasal Swab  Result Value Ref Range   SARS Coronavirus 2 by RT PCR NEGATIVE NEGATIVE   Influenza A by PCR NEGATIVE NEGATIVE   Influenza B by PCR NEGATIVE NEGATIVE    Comment: (NOTE) The Xpert Xpress SARS-CoV-2/FLU/RSV plus assay is intended as an aid in the diagnosis of influenza from Nasopharyngeal swab specimens and should not be used as a sole basis for treatment. Nasal washings and aspirates are unacceptable for Xpert Xpress SARS-CoV-2/FLU/RSV testing.  Fact Sheet for Patients: BloggerCourse.com  Fact Sheet for Healthcare Providers: SeriousBroker.it  This test is not yet approved or cleared by the [place] FDA and has been authorized for detection and/or diagnosis of SARS-CoV-2 by FDA under an Emergency Use Authorization (EUA). This EUA will remain in effect (meaning this test can be used) for the duration of the COVID-19 declaration under Section 564(b)(1) of the Act, 21 U.S.C. section 360bbb-3(b)(1), unless the authorization is terminated or revoked.     Resp Syncytial Virus by PCR NEGATIVE NEGATIVE    Comment: (NOTE) Fact Sheet for Patients: BloggerCourse.com  Fact Sheet for Healthcare Providers: SeriousBroker.it  This test is not yet approved or cleared by the [place] FDA and has been authorized for detection and/or diagnosis of  SARS-CoV-2 by FDA under an Emergency Use Authorization (EUA). This EUA will remain in effect (meaning this test can be used) for the duration of the COVID-19 declaration under Section 564(b)(1) of the Act, 21 U.S.C. section 360bbb-3(b)(1), unless the authorization is terminated or revoked.  Performed at [facility], [address], [place], [place] [address]   Basic metabolic panel     Status: Abnormal   Collection Time: 10/20/23  1:25 AM  Result Value Ref Range   Sodium 137 135 - 145 mmol/L   Potassium 4.3 3.5 - 5.1 mmol/L   Chloride 107 98 - 111 mmol/L   CO2 22 22 - 32 mmol/L   Glucose, Bld 196 (H) 70 - 99 mg/dL    Comment: Glucose reference range applies only to samples taken after fasting for at least 8 hours.   BUN 45 (H) 8 - 23 mg/dL   Creatinine, Ser 9.62 (H) 0.61 - 1.24 mg/dL   Calcium 8.9 8.9 - 95.2 mg/dL   GFR, Estimated 20 (L) >60 mL/min    Comment: (NOTE) Calculated using the CKD-EPI Creatinine Equation (2021)    Anion gap 8 5 - 15    Comment: Performed at [facility], [address] [place], [place] [address]  CBC     Status: Abnormal   Collection Time: 10/20/23  1:25 AM  Result Value Ref Range   WBC 14.4 (H) 4.0 - 10.5 K/uL   RBC 3.37 (L) 4.22 - 5.81 MIL/uL   Hemoglobin 8.6 (L) 13.0 - 17.0 g/dL    Comment: Reticulocyte Hemoglobin testing may be clinically indicated, consider ordering this additional test [id number]    HCT 26.2 (L) 39.0 - 52.0 %   MCV 77.7 (L) 80.0 - 100.0 fL   MCH 25.5 (L)  26.0 - 34.0 pg   MCHC 32.8 30.0 - 36.0 g/dL   RDW 09.8 11.9 - 14.7 %   Platelets 235 150 - 400 K/uL   nRBC 0.0 0.0 - 0.2 %    Comment: Performed at [facility], [address], [place], [place] [address]  Troponin I (High Sensitivity)     Status: Abnormal   Collection Time: 10/20/23  1:25 AM  Result Value Ref Range   Troponin I (High Sensitivity) 30 (H) <18 ng/L    Comment: (NOTE) Elevated high sensitivity troponin I (hsTnI) values and  significant  changes across serial measurements may suggest ACS but many other  chronic and acute conditions are known to elevate hsTnI results.  Refer to the "Links" section for chest pain algorithms and additional  guidance. Performed at [facility], [address] [place], [place] [address]   Ferritin     Status: None   Collection Time: 10/20/23  1:25 AM  Result Value Ref Range   Ferritin 65 24 - 336 ng/mL    Comment: Performed at [facility], [address], [place], [place] [address]  Iron and TIBC     Status: Abnormal   Collection Time: 10/20/23  1:25 AM  Result Value Ref Range   Iron 19 (L) 45 - 182 ug/dL   TIBC 696 (L) 295 - 284 ug/dL   Saturation Ratios 8 (L) 17.9 - 39.5 %   UIBC 226 ug/dL    Comment: Performed at [facility], [address], [place], [place] [address]  Vitamin B12     Status: None   Collection Time: 10/20/23  1:25 AM  Result Value Ref Range   Vitamin B-12 395 180 - 914 pg/mL    Comment: (NOTE) This assay is not validated for testing neonatal or myeloproliferative syndrome specimens for Vitamin B12 levels. Performed at [facility], [address], [place], [place] [address]   CBG monitoring, ED     Status: Abnormal   Collection Time: 10/20/23  1:44 AM  Result Value Ref Range   Glucose-Capillary 186 (H) 70 - 99 mg/dL    Comment: Glucose reference range applies only to samples taken after fasting for at least 8 hours.  Urinalysis, w/ Reflex to Culture (Infection Suspected) -Urine, Clean Catch     Status: Abnormal   Collection Time: 10/20/23  2:08 AM  Result Value Ref Range   Specimen Source URINE, CLEAN CATCH    Color, Urine YELLOW YELLOW   APPearance HAZY (A) CLEAR   Specific Gravity, Urine 1.009 1.005 - 1.030   pH 5.0 5.0 - 8.0   Glucose, UA 150 (A) NEGATIVE mg/dL   Hgb urine dipstick SMALL (A) NEGATIVE   Bilirubin Urine NEGATIVE NEGATIVE   Ketones, ur NEGATIVE NEGATIVE mg/dL   Protein, ur 30 (A) NEGATIVE mg/dL    Nitrite NEGATIVE NEGATIVE   Leukocytes,Ua LARGE (A) NEGATIVE   RBC / HPF 0-5 0 - 5 RBC/hpf   WBC, UA >50 0 - 5 WBC/hpf    Comment:        Reflex urine culture not performed if WBC <=10, OR if Squamous epithelial cells >5. If Squamous epithelial cells >5 suggest recollection.    Bacteria, UA FEW (A) NONE SEEN   Squamous Epithelial / HPF 0-5 0 - 5 /HPF    Comment: Performed at [facility], [address] [place], [place] [address]  Troponin I (High Sensitivity)  Status: Abnormal   Collection Time: 10/20/23  6:04 AM  Result Value Ref Range   Troponin I (High Sensitivity) 26 (H) <18 ng/L    Comment: (NOTE) Elevated high sensitivity troponin I (hsTnI) values and significant  changes across serial measurements may suggest ACS but many other  chronic and acute conditions are known to elevate hsTnI results.  Refer to the "Links" section for chest pain algorithms and additional  guidance. Performed at [facility], [address] [place], [place] [address]   CBG monitoring, ED     Status: Abnormal   Collection Time: 10/20/23  8:23 AM  Result Value Ref Range   Glucose-Capillary 183 (H) 70 - 99 mg/dL    Comment: Glucose reference range applies only to samples taken after fasting for at least 8 hours.  CBG monitoring, ED     Status: Abnormal   Collection Time: 10/20/23 11:40 AM  Result Value Ref Range   Glucose-Capillary 126 (H) 70 - 99 mg/dL    Comment: Glucose reference range applies only to samples taken after fasting for at least 8 hours.  CBG monitoring, ED     Status: Abnormal   Collection Time: 10/20/23  4:25 PM  Result Value Ref Range   Glucose-Capillary 139 (H) 70 - 99 mg/dL    Comment: Glucose reference range applies only to samples taken after fasting for at least 8 hours.   VAS US CAROTID  Result Date: 10/20/2023 Carotid Arterial Duplex Study Patient Name:  UNICE ANGIER  Date of Exam:   10/20/2023 Medical Rec #: [id number]        Accession #:     [id number] Date of Birth: [birth year]       Patient Gender: M Patient Age:   55 years Exam Location:  [facility] Procedure:      VAS US CAROTID Referring Phys: Ulyess Blossom RATHORE --------------------------------------------------------------------------------  Indications:       Syncope. Risk Factors:      Hypertension, hyperlipidemia, Diabetes. Other Factors:     CKD IV. Limitations        Today's exam was limited due to constant movement of head,                    the patient's inability or unwillingness to cooperate and                    heavy calcification and the resulting shadowing. Comparison Study:  Prior carotid duplex done 11/02/21 indicating 40-59% right                    ICA stenosis and 1-39% left ICA stenosis. Performing Technologist: Sherren Kerns RVS  Examination Guidelines: A complete evaluation includes B-mode imaging, spectral Doppler, color Doppler, and power Doppler as needed of all accessible portions of each vessel. Bilateral testing is considered an integral part of a complete examination. Limited examinations for reoccurring indications may be performed as noted.  Right Carotid Findings: +----------+--------+--------+--------+------------------+---------+           PSV cm/sEDV cm/sStenosisPlaque DescriptionComments  +----------+--------+--------+--------+------------------+---------+ CCA Prox  87      10              homogeneous                 +----------+--------+--------+--------+------------------+---------+ CCA Distal121     12              homogeneous                 +----------+--------+--------+--------+------------------+---------+  ICA Prox  351     37      40-59%  calcific          Shadowing +----------+--------+--------+--------+------------------+---------+ ICA Mid   286     23                                tortuous  +----------+--------+--------+--------+------------------+---------+ ICA Distal81      15                                 tortuous  +----------+--------+--------+--------+------------------+---------+ ECA       192     28                                          +----------+--------+--------+--------+------------------+---------+ +----------+--------+-------+--------+-------------------+           PSV cm/sEDV cmsDescribeArm Pressure (mmHG) +----------+--------+-------+--------+-------------------+ [id number]                                         +----------+--------+-------+--------+-------------------+ +---------+--------+--+--------+--+ VertebralPSV cm/s85EDV cm/s10 +---------+--------+--+--------+--+  Left Carotid Findings: +----------+--------+--------+--------+------------------+--------+           PSV cm/sEDV cm/sStenosisPlaque DescriptionComments +----------+--------+--------+--------+------------------+--------+ CCA Prox  140     17              heterogenous               +----------+--------+--------+--------+------------------+--------+ CCA Distal150     13              heterogenous               +----------+--------+--------+--------+------------------+--------+ ICA Prox  159     13      1-39%                              +----------+--------+--------+--------+------------------+--------+ ICA Mid   112     20                                tortuous +----------+--------+--------+--------+------------------+--------+ ICA Distal111     16                                tortuous +----------+--------+--------+--------+------------------+--------+ ECA       147     0                                          +----------+--------+--------+--------+------------------+--------+ +----------+--------+--------+--------+-------------------+           PSV cm/sEDV cm/sDescribeArm Pressure (mmHG) +----------+--------+--------+--------+-------------------+ Subclavian210                                          +----------+--------+--------+--------+-------------------+ +---------+--------+--+--------+--+ VertebralPSV cm/s66EDV cm/s11 +---------+--------+--+--------+--+   Summary: Right Carotid: Velocities in the right ICA are consistent with a 40-59%  stenosis. Higher velocities may be obscured by calcification. Left Carotid: Velocities in the left ICA are consistent with a 1-39% stenosis. Vertebrals:  Bilateral vertebral arteries demonstrate antegrade flow. Subclavians: Normal flow hemodynamics were seen in bilateral subclavian              arteries. *See table(s) above for measurements and observations.     Preliminary    ECHOCARDIOGRAM COMPLETE  Result Date: 10/20/2023    ECHOCARDIOGRAM REPORT   Patient Name:   SWANSON KOSTOFF Date of Exam: 10/20/2023 Medical Rec #:  [id number]       Height:       71.0 in Accession #:    [id number]      Weight:       168.0 lb Date of Birth:  1932-11-27      BSA:          1.958 m Patient Age:    89 years        BP:           158/81 mmHg Patient Gender: M               HR:           72 bpm. Exam Location:  Inpatient Procedure: 2D Echo, Color Doppler and Cardiac Doppler Indications:    Syncope  History:        Patient has no prior history of Echocardiogram examinations.                 Elevated Troponin, CKD and Carotid Disease,                 Signs/Symptoms:Syncope; Risk Factors:Diabetes, Dyslipidemia and                 Hypertension.  Sonographer:    Milbert Coulter Referring Phys: [id number] VASUNDHRA RATHORE IMPRESSIONS  1. Left ventricular ejection fraction, by estimation, is 60 to 65%. The left ventricle has normal function. The left ventricle has no regional wall motion abnormalities. There is moderate left ventricular hypertrophy. Left ventricular diastolic parameters are consistent with Grade I diastolic dysfunction (impaired relaxation).  2. Right ventricular systolic function is normal. The right ventricular size is normal.  3. Left atrial size was mildly  dilated.  4. The mitral valve is abnormal. No evidence of mitral valve regurgitation. No evidence of mitral stenosis. Moderate mitral annular calcification.  5. The aortic valve is tricuspid. There is moderate calcification of the aortic valve. There is moderate thickening of the aortic valve. Aortic valve regurgitation is not visualized. Aortic valve sclerosis is present, with no evidence of aortic valve stenosis.  6. The inferior vena cava is dilated in size with >50% respiratory variability, suggesting right atrial pressure of 8 mmHg. FINDINGS  Left Ventricle: Left ventricular ejection fraction, by estimation, is 60 to 65%. The left ventricle has normal function. The left ventricle has no regional wall motion abnormalities. The left ventricular internal cavity size was normal in size. There is  moderate left ventricular hypertrophy. Left ventricular diastolic parameters are consistent with Grade I diastolic dysfunction (impaired relaxation). Right Ventricle: The right ventricular size is normal. No increase in right ventricular wall thickness. Right ventricular systolic function is normal. Left Atrium: Left atrial size was mildly dilated. Right Atrium: Right atrial size was normal in size. Pericardium: There is no evidence of pericardial effusion. Mitral Valve: The mitral valve is abnormal. There is mild thickening of the mitral valve leaflet(s). There is mild calcification of the mitral valve leaflet(s).  Moderate mitral annular calcification. No evidence of mitral valve regurgitation. No evidence  of mitral valve stenosis. Tricuspid Valve: The tricuspid valve is normal in structure. Tricuspid valve regurgitation is not demonstrated. No evidence of tricuspid stenosis. Aortic Valve: The aortic valve is tricuspid. There is moderate calcification of the aortic valve. There is moderate thickening of the aortic valve. Aortic valve regurgitation is not visualized. Aortic valve sclerosis is present, with no evidence of  aortic valve stenosis. Aortic valve mean gradient measures 4.0 mmHg. Aortic valve peak gradient measures 6.8 mmHg. Aortic valve area, by VTI measures 3.18 cm. Pulmonic Valve: The pulmonic valve was normal in structure. Pulmonic valve regurgitation is not visualized. No evidence of pulmonic stenosis. Aorta: The aortic root is normal in size and structure. Venous: The inferior vena cava is dilated in size with greater than 50% respiratory variability, suggesting right atrial pressure of 8 mmHg. IAS/Shunts: No atrial level shunt detected by color flow Doppler.  LEFT VENTRICLE PLAX 2D LVIDd:         4.30 cm   Diastology LVIDs:         2.90 cm   LV e' medial:    7.62 cm/s LV PW:         1.50 cm   LV E/e' medial:  9.8 LV IVS:        1.50 cm   LV e' lateral:   11.20 cm/s LVOT diam:     2.00 cm   LV E/e' lateral: 6.7 LV SV:         96 LV SV Index:   49 LVOT Area:     3.14 cm  RIGHT VENTRICLE RV Basal diam:  3.40 cm RV S prime:     11.30 cm/s TAPSE (M-mode): 2.2 cm LEFT ATRIUM             Index        RIGHT ATRIUM           Index LA diam:        4.00 cm 2.04 cm/m   RA Area:     11.40 cm LA Vol (A2C):   69.2 ml 35.34 ml/m  RA Volume:   26.20 ml  13.38 ml/m LA Vol (A4C):   28.2 ml 14.40 ml/m LA Biplane Vol: 48.8 ml 24.92 ml/m  AORTIC VALVE AV Area (Vmax):    2.95 cm AV Area (Vmean):   3.19 cm AV Area (VTI):     3.18 cm AV Vmax:           130.00 cm/s AV Vmean:          88.200 cm/s AV VTI:            0.303 m AV Peak Grad:      6.8 mmHg AV Mean Grad:      4.0 mmHg LVOT Vmax:         122.00 cm/s LVOT Vmean:        89.600 cm/s LVOT VTI:          0.307 m LVOT/AV VTI ratio: 1.01  AORTA Ao Root diam: 3.40 cm Ao Asc diam:  3.40 cm MITRAL VALVE MV Area (PHT): 2.63 cm     SHUNTS MV Decel Time: 288 msec     Systemic VTI:  0.31 m MV E velocity: 74.90 cm/s   Systemic Diam: 2.00 cm MV A velocity: 117.00 cm/s MV E/A ratio:  0.64 Charlton Haws MD Electronically signed by Charlton Haws MD Signature Date/Time: 10/20/2023/10:23:12 AM  Final    CT HEAD WO CONTRAST ( )  Result Date: 10/20/2023 CLINICAL DATA:  Recent syncopal episode EXAM: CT HEAD WITHOUT CONTRAST TECHNIQUE: Contiguous axial images were obtained from the base of the skull through the vertex without intravenous contrast. RADIATION DOSE REDUCTION: This exam was performed according to the departmental dose-optimization program which includes automated exposure control, adjustment of the mA and/or kV according to patient size and/or use of iterative reconstruction technique. COMPARISON:  04/26/2022 FINDINGS: Brain: No evidence of acute infarction, hemorrhage, hydrocephalus, extra-axial collection or mass lesion/mass effect. Chronic atrophic and ischemic changes are noted with evidence of ventricular dilatation. Vascular: No hyperdense vessel or unexpected calcification. Skull: Normal. Negative for fracture or focal lesion. Sinuses/Orbits: No acute finding. Other: None. IMPRESSION: Chronic atrophic and ischemic changes similar to that seen on prior exam. Stable ventricular dilatation Electronically Signed   By: Alcide Clever M.D.   On: 10/20/2023 00:22   DG Chest 2 View  Result Date: 10/19/2023 CLINICAL DATA:  Syncope, fatigue EXAM: CHEST - 2 VIEW COMPARISON:  04/26/2022 FINDINGS: Patchy airspace disease in the lower lobes, left greater than right concerning for pneumonia. No effusions. Heart and mediastinal contours within normal limits. No acute bony abnormality. IMPRESSION: Bilateral lower lobe airspace opacities, left greater than right concerning for pneumonia. Electronically Signed   By: Charlett Nose M.D.   On: 10/19/2023 19:48    Pending Labs Unresulted Labs (From admission, onward)     Start     Ordered   10/21/23 0500  Basic metabolic panel  Daily,   R      10/20/23 0926   10/21/23 0500  CBC  Daily,   R      10/20/23 0926   10/21/23 0500  Phosphorus  Tomorrow morning,   R        10/20/23 0926   10/20/23 0208  Urine Culture  Once,   R        10/20/23 0208    10/19/23 2346  Strep pneumoniae urinary antigen  Once,   R        10/19/23 2349   10/19/23 2346  Legionella Pneumophila Serogp 1 Ur Ag  Once,   R        10/19/23 2349   10/19/23 2345  Expectorated Sputum Assessment w Gram Stain, Rflx to Resp Cult  Once,   R        10/19/23 2349   10/19/23 2344  Hemoglobin A1c  Once,   R       Comments: To assess prior glycemic control    10/19/23 2349   10/19/23 2343  Culture, blood (Routine X 2) w Reflex to ID Panel  BLOOD CULTURE X 2,   R (with TIMED occurrences)      10/19/23 2349            Vitals/Pain Today's Vitals   10/20/23 1305 10/20/23 1315 10/20/23 1330 10/20/23 1454  BP:  (!) 159/60 (!) 156/55   Pulse:  70 65   Resp:  14 14   Temp: 98.5 F (36.9 C)     TempSrc: Oral     SpO2:  100% 100%   Weight:      Height:      PainSc:    0-No pain    Isolation Precautions No active isolations  Medications Medications  azithromycin (ZITHROMAX) 500 mg in sodium chloride 0.9 % 250 mL IVPB (has no administration in time range)  cefTRIAXone (ROCEPHIN) 2 g in sodium chloride 0.9 % 100  mL IVPB (has no administration in time range)  rosuvastatin (CRESTOR) tablet 10 mg (10 mg Oral Given 10/20/23 0855)  sodium bicarbonate tablet 650 mg (650 mg Oral Given 10/20/23 0856)  acetaminophen (TYLENOL) tablet 650 mg (650 mg Oral Given 10/20/23 1610)    Or  acetaminophen (TYLENOL) suppository 650 mg ( Rectal See Alternative 10/20/23 0212)  insulin aspart (novoLOG) injection 0-9 Units (1 Units Subcutaneous Given 10/20/23 1230)  insulin aspart (novoLOG) injection 0-5 Units (0 Units Subcutaneous Hold 10/20/23 0144)  metoprolol tartrate (LOPRESSOR) injection 5 mg (has no administration in time range)  hydrALAZINE (APRESOLINE) injection 10 mg (has no administration in time range)  ipratropium-albuterol (DUONEB) 0.5-2.5 (3) MG/3ML nebulizer solution 3 mL (has no administration in time range)  ondansetron (ZOFRAN) injection 4 mg (has no administration in time range)   traZODone (DESYREL) tablet 50 mg (has no administration in time range)  senna-docusate (Senokot-S) tablet 1 tablet (has no administration in time range)  guaiFENesin (ROBITUSSIN) 100 MG/5ML liquid 5 mL (has no administration in time range)  aspirin chewable tablet 81 mg (81 mg Oral Given 10/20/23 1229)  amLODipine (NORVASC) tablet 10 mg (10 mg Oral Given 10/20/23 1229)  rivaroxaban (XARELTO) tablet 2.5 mg (2.5 mg Oral Given 10/20/23 1229)  0.9 %  sodium chloride infusion ( Intravenous New Bag/Given 10/20/23 1152)  hydrALAZINE (APRESOLINE) tablet 50 mg (50 mg Oral Given 10/20/23 1548)  sodium chloride 0.9 % bolus 500 mL (0 mLs Intravenous Stopped 10/19/23 2101)  cefTRIAXone (ROCEPHIN) 1 g in sodium chloride 0.9 % 100 mL IVPB (0 g Intravenous Stopped 10/19/23 2217)  azithromycin (ZITHROMAX) 500 mg in sodium chloride 0.9 % 250 mL IVPB (0 mg Intravenous Stopped 10/19/23 2338)  sodium chloride 0.9 % bolus 1,000 mL (0 mLs Intravenous Stopped 10/19/23 2338)    Mobility walks with device     Focused Assessments     R Recommendations: See Admitting Provider Note  Report given to:   Additional Notes: Call or message with any questions.

## 2023-10-20 NOTE — Progress Notes (Signed)
VASCULAR LAB    Carotid duplex has been performed.  See CV proc for preliminary results.   Clever Geraldo, RVT 10/20/2023, 10:32 AM

## 2023-10-20 NOTE — Hospital Course (Addendum)
Brief Narrative:  89 year old with history of dementia, HTN, HLD, DM2, CKD 4, PVD, right internal carotid stenosis on Xarelto, colon cancer status post hemicolectomy and chemotherapy in 2005 comes to the ED with complaints of syncope, cough and vomiting.  Chest x-ray shows concerns of possible bilateral pneumonia.  Started on Rocephin, azithromycin.   Assessment & Plan:  Principal Problem:   CAP (community acquired pneumonia) Active Problems:   Essential hypertension   Mixed hyperlipidemia   Syncope   Acute renal failure superimposed on stage 4 chronic kidney disease (HCC)   Acute encephalopathy   Elevated troponin   Type 2 diabetes mellitus (HCC)     Community-acquired pneumonia Urinary tract infection Chest x-ray showing bilateral pneumonia.  COVID/RSV/flu are negative.  Sepsis physiology slowly improving.  Currently on Rocephin and azithromycin.  Follow urine cultures.   AKI on CKD stage IV Baseline creatinine around 2.5, admission creatinine 3.17> 2.81 slowly improving.  Continue IV fluids.  Holding home HCTZ and lisinopril   Vomiting and diarrhea No further episodes.  Will closely monitor this   Syncope Unclear etiology.  Possible dehydration.  CT head negative.  Echocardiogram shows EF 65%, moderate LVH.  And carotid Dopplers-pending PT/OT.   Acute encephalopathy Possibly from dehydration underlying infection with probably underlying dementia.  TSH, CT head negative.    Mild troponin elevation Troponins peaked at 30.  Demand ischemia.  No further workup besides echocardiogram   Chronic microcytic anemia with iron deficiency Hemoglobin is currently stable around baseline of 9.0.  Microcytosis noted with iron deficiency.  Will start on p.o. supplements with bowel regimen   Hypertension Holding home HCTZ/lisinopril due to AKI Temporarily will change hydralazine to 3 times daily Continue Norvasc 10 mg daily IV as needed   Hyperlipidemia Continue Crestor.    Insulin-dependent type 2 diabetes A1c pending.  He is not on long-acting insulin at home.  Placed on sensitive sliding scale insulin ACHS.   History of PVD and right internal carotid artery stenosis Resume home Xarelto (should really be on eliquis due to abnormal Renal Fx).  Repeat carotid Dopplers ordered given syncopal event.  PT/OT-pending  DVT prophylaxis: SCDs Start: 10/19/23 2343 Code Status: Full Family Communication:   Cont hosp stay for eval of Syncope Uti, Cap.  Pending PT/OT evaluation   Subjective: Still feels weak this morning but overall slightly feeling better.   Examination: , General exam: Appears calm and comfortable, elderly frail Respiratory system: Clear to auscultation. Respiratory effort normal. Cardiovascular system: S1 & S2 heard, RRR. No JVD, murmurs, rubs, gallops or clicks. No pedal edema. Gastrointestinal system: Abdomen is nondistended, soft and nontender. No organomegaly or masses felt. Normal bowel sounds heard. Central nervous system: Alert and oriented. No focal neurological deficits. Extremities: Symmetric 5 x 5 power. Skin: No rashes, lesions or ulcers Psychiatry: Judgement and insight appear normal. Mood & affect appropriate.

## 2023-10-20 NOTE — ED Notes (Signed)
Transported to vascular US.

## 2023-10-21 DIAGNOSIS — J189 Pneumonia, unspecified organism: Secondary | ICD-10-CM | POA: Diagnosis not present

## 2023-10-21 LAB — URINE CULTURE: Culture: >=100000 — AB

## 2023-10-21 LAB — CBC
HCT: 25.5 % — ABNORMAL LOW (ref 39.0–52.0)
Hemoglobin: 8.6 g/dL — ABNORMAL LOW (ref 13.0–17.0)
MCH: 25.8 pg — ABNORMAL LOW (ref 26.0–34.0)
MCHC: 33.7 g/dL (ref 30.0–36.0)
MCV: 76.6 fL — ABNORMAL LOW (ref 80.0–100.0)
Platelets: 230 10*3/uL (ref 150–400)
RBC: 3.33 MIL/uL — ABNORMAL LOW (ref 4.22–5.81)
RDW: 14.5 % (ref 11.5–15.5)
WBC: 8.4 10*3/uL (ref 4.0–10.5)
nRBC: 0 % (ref 0.0–0.2)

## 2023-10-21 LAB — PHOSPHORUS: Phosphorus: 2.9 mg/dL (ref 2.5–4.6)

## 2023-10-21 LAB — GLUCOSE, CAPILLARY
Glucose-Capillary: 125 mg/dL — ABNORMAL HIGH (ref 70–99)
Glucose-Capillary: 153 mg/dL — ABNORMAL HIGH (ref 70–99)
Glucose-Capillary: 128 mg/dL — ABNORMAL HIGH (ref 70–99)
Glucose-Capillary: 140 mg/dL — ABNORMAL HIGH (ref 70–99)

## 2023-10-21 LAB — BASIC METABOLIC PANEL
Anion gap: 8 (ref 5–15)
BUN: 42 mg/dL — ABNORMAL HIGH (ref 8–23)
CO2: 24 mmol/L (ref 22–32)
Calcium: 8.9 mg/dL (ref 8.9–10.3)
Chloride: 105 mmol/L (ref 98–111)
Creatinine, Ser: 2.81 mg/dL — ABNORMAL HIGH (ref 0.61–1.24)
GFR, Estimated: 21 mL/min — ABNORMAL LOW (ref >60)
Glucose, Bld: 136 mg/dL — ABNORMAL HIGH (ref 70–99)
Potassium: 4 mmol/L (ref 3.5–5.1)
Sodium: 137 mmol/L (ref 135–145)

## 2023-10-21 LAB — HEMOGLOBIN A1C
Hgb A1c MFr Bld: 8.2 % — ABNORMAL HIGH (ref 4.8–5.6)
Mean Plasma Glucose: 189 mg/dL

## 2023-10-21 MED ORDER — FERROUS SULFATE 325 (65 FE) MG PO TABS
325.0000 mg | ORAL_TABLET | Freq: Every day | ORAL | Status: DC
Start: 2023-10-22 — End: 2023-10-25
  Administered 2023-10-22 – 2023-10-25 (×4): 325 mg via ORAL
  Filled 2023-10-21 (×4): qty 1

## 2023-10-21 MED ORDER — SENNOSIDES-DOCUSATE SODIUM 8.6-50 MG PO TABS
2.0000 | ORAL_TABLET | Freq: Every day | ORAL | Status: DC
  Administered 2023-10-21 – 2023-10-24 (×4): 2 via ORAL
  Filled 2023-10-21 (×4): qty 2

## 2023-10-21 NOTE — TOC Initial Note (Signed)
Transition of Care [facility]) - Initial/Assessment Note    Patient Details  Name: Jonathan Baird MRN: [id number] Date of Birth: 21-Jun-1933  [facility]) CM/SW Contact:    Leone Haven, RN Phone Number: 10/21/2023, 2:39 PM  Clinical Narrative:                 From home with spouse, has PCP and insurance on file, states has no HH services in place at this time ,has cane and walker at home.  States wife  will transport them home at [employer] and family is support system, states gets [facility] .  Pta ambulates with walker.  Awaiting PT/OT eval.  Expected Discharge Plan: Home w Home Health Services Barriers to Discharge: Continued Medical Work up   Patient Goals and CMS Choice Patient states their goals for this hospitalization and ongoing recovery are:: return home   Choice offered to / list presented to : NA      Expected Discharge Plan and Services In-house Referral: NA Discharge Planning Services: CM Consult Post Acute Care Choice: NA Living arrangements for the past 2 months: Single Family Home                 DME Arranged: N/A DME Agency: NA       HH Arranged: NA          Prior Living Arrangements/Services Living arrangements for the past 2 months: Single Family Home Lives with:: Spouse Patient language and need for interpreter reviewed:: Yes Do you feel safe going back to the place where you live?: Yes      Need for Family Participation in Patient Care: Yes (Comment) Care giver support system in place?: Yes (comment) Current home services: DME (cane , walker) Criminal Activity/Legal Involvement Pertinent to Current Situation/Hospitalization: No - Comment as needed  Activities of Daily Living      Permission Sought/Granted Permission sought to share information with : Case Manager Permission granted to share information with : Yes, Verbal Permission Granted              Emotional Assessment Appearance:: Appears stated  age Attitude/Demeanor/Rapport: Engaged Affect (typically observed): Appropriate Orientation: : Oriented to Self, Oriented to Place, Oriented to  Time, Oriented to Situation Alcohol / Substance Use: Not Applicable Psych Involvement: No (comment)  Admission diagnosis:  Syncope [R55] AKI (acute kidney injury) (HCC) [N17.9] Syncope, unspecified syncope type [R55] Pneumonia due to infectious organism, unspecified laterality, unspecified part of lung [J18.9] Patient Active Problem List   Diagnosis Date Noted   Syncope 10/19/2023   CAP (community acquired pneumonia) 10/19/2023   Acute renal failure superimposed on stage 4 chronic kidney disease (HCC) 10/19/2023   Acute encephalopathy 10/19/2023   Elevated troponin 10/19/2023   Type 2 diabetes mellitus (HCC) 10/19/2023   History of vitrectomy 04/30/2022   Abnormal gait 02/13/2022   Altered mental status 02/13/2022   Unspecified visual loss 02/13/2022   Stage 3b chronic kidney disease (HCC) 09/25/2021   Stenosis of carotid artery 09/25/2021   Essential hypertension 01/17/2021   Chronic kidney disease due to hypertension 01/17/2021   Diabetic renal disease (HCC) 01/17/2021   Diabetic retinopathy associated with type 2 diabetes mellitus (HCC) 01/17/2021   Disorder of arteries and arterioles, unspecified (HCC) 01/17/2021   Drug-induced hypoglycemia without coma 01/17/2021   Adult general medical exam 01/17/2021   Hyperglycemia due to type 2 diabetes mellitus (HCC) 01/17/2021   Long term (current) use of insulin (HCC) 01/17/2021   Mixed hyperlipidemia  01/17/2021   Peripheral vascular disease (HCC) 01/17/2021   Controlled diabetes mellitus with stable proliferative retinopathy of both eyes (HCC) 08/10/2020   Right epiretinal membrane 08/10/2020   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 degeneration of both eyes with subfoveal involvement 08/10/2020   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 - [address] [place] [address] Phone: [phone] Fax: [phone]  [facility] - [place], [place] - [address] [place] [address] Phone: [phone] Fax: [phone]     Social Determinants of Health (SDOH) Social History: SDOH Screenings   Food Insecurity: No Food Insecurity (10/20/2023)  Housing: Patient Declined (10/20/2023)  Transportation Needs: No Transportation Needs (10/20/2023)  Utilities: Not At Risk (10/20/2023)  Tobacco Use: Low Risk  (10/19/2023)   SDOH Interventions:     Readmission Risk Interventions     No data to display

## 2023-10-21 NOTE — Progress Notes (Signed)
PT Cancellation Note  Patient Details Name: DORAN FAUSTINI MRN: [id number] DOB: 04-25-1933   Cancelled Treatment:    Reason Eval/Treat Not Completed: Patient declined, no reason specified. Pt declines PT intervention, preparing to eat his dinner which just arrived. Pt requests PT return tomorrow.   Arlyss Gandy 10/21/2023, 5:09 PM

## 2023-10-21 NOTE — Progress Notes (Signed)
PROGRESS NOTE    Jonathan Baird  GLO:[id number] DOB: 08-08-1933 DOA: 10/19/2023 PCP: Georgianne Fick, MD    Brief Narrative:  89 year old with history of dementia, HTN, HLD, DM2, CKD 4, PVD, right internal carotid stenosis on Xarelto, colon cancer status post hemicolectomy and chemotherapy in 2005 comes to the ED with complaints of syncope, cough and vomiting.  Chest x-ray shows concerns of possible bilateral pneumonia.  Started on Rocephin, azithromycin.   Assessment & Plan:  Principal Problem:   CAP (community acquired pneumonia) Active Problems:   Essential hypertension   Mixed hyperlipidemia   Syncope   Acute renal failure superimposed on stage 4 chronic kidney disease (HCC)   Acute encephalopathy   Elevated troponin   Type 2 diabetes mellitus (HCC)     Community-acquired pneumonia Urinary tract infection Chest x-ray showing bilateral pneumonia.  COVID/RSV/flu are negative.  Sepsis physiology slowly improving.  Currently on Rocephin and azithromycin.  Follow urine cultures.   AKI on CKD stage IV Baseline creatinine around 2.5, admission creatinine 3.17> 2.81 slowly improving.  Continue IV fluids.  Holding home HCTZ and lisinopril   Vomiting and diarrhea No further episodes.  Will closely monitor this   Syncope Unclear etiology.  Possible dehydration.  CT head negative.  Echocardiogram shows EF 65%, moderate LVH.  And carotid Dopplers-pending PT/OT.   Acute encephalopathy Possibly from dehydration underlying infection with probably underlying dementia.  TSH, CT head negative.    Mild troponin elevation Troponins peaked at 30.  Demand ischemia.  No further workup besides echocardiogram   Chronic microcytic anemia with iron deficiency Hemoglobin is currently stable around baseline of 9.0.  Microcytosis noted with iron deficiency.  Will start on p.o. supplements with bowel regimen   Hypertension Holding home HCTZ/lisinopril due to AKI Temporarily will change  hydralazine to 3 times daily Continue Norvasc 10 mg daily IV as needed   Hyperlipidemia Continue Crestor.   Insulin-dependent type 2 diabetes A1c pending.  He is not on long-acting insulin at home.  Placed on sensitive sliding scale insulin ACHS.   History of PVD and right internal carotid artery stenosis Resume home Xarelto (should really be on eliquis due to abnormal Renal Fx).  Repeat carotid Dopplers ordered given syncopal event.  PT/OT-pending  DVT prophylaxis: SCDs Start: 10/19/23 2343 Code Status: Full Family Communication:   Cont hosp stay for eval of Syncope Uti, Cap.  Pending PT/OT evaluation   Subjective: Still feels weak this morning but overall slightly feeling better.   Examination: , General exam: Appears calm and comfortable, elderly frail Respiratory system: Clear to auscultation. Respiratory effort normal. Cardiovascular system: S1 & S2 heard, RRR. No JVD, murmurs, rubs, gallops or clicks. No pedal edema. Gastrointestinal system: Abdomen is nondistended, soft and nontender. No organomegaly or masses felt. Normal bowel sounds heard. Central nervous system: Alert and oriented. No focal neurological deficits. Extremities: Symmetric 5 x 5 power. Skin: No rashes, lesions or ulcers Psychiatry: Judgement and insight appear normal. Mood & affect appropriate.                Diet Orders (From admission, onward)     Start     Ordered   10/19/23 2343  Diet heart healthy/carb modified Room service appropriate? Yes; Fluid consistency: Thin  Diet effective now       Question Answer Comment  Diet-HS Snack? Nothing   Room service appropriate? Yes   Fluid consistency: Thin      10/19/23 2349  Objective: Vitals:   10/21/23 0440 10/21/23 0735 10/21/23 1006 10/21/23 1057  BP: (!) 160/64 137/66 (!) 166/53 (!) 169/65  Pulse: 68   60  Resp: 16   17  Temp: 100.1 F (37.8 C) 98.9 F (37.2 C)  98.7 F (37.1 C)  TempSrc: Oral Oral  Oral   SpO2: 100%   100%  Weight: 70.5 kg     Height:        Intake/Output Summary (Last 24 hours) at 10/21/2023 1202 Last data filed at 10/21/2023 0819 Gross per 24 hour  Intake 1929.72 ml  Output 750 ml  Net 1179.72 ml   Filed Weights   10/19/23 1836 10/20/23 1806 10/21/23 0440  Weight: 76.2 kg 70.5 kg 70.5 kg    Scheduled Meds:  amLODipine  10 mg Oral Daily   aspirin  81 mg Oral Daily   [START ON 10/22/2023] ferrous sulfate  325 mg Oral Q breakfast   hydrALAZINE  50 mg Oral TID   insulin aspart  0-5 Units Subcutaneous QHS   insulin aspart  0-9 Units Subcutaneous TID WC   rivaroxaban  2.5 mg Oral BID   rosuvastatin  10 mg Oral Daily   senna-docusate  2 tablet Oral QHS   sodium bicarbonate  650 mg Oral Daily   Continuous Infusions:  azithromycin Stopped (10/20/23 2328)   cefTRIAXone (ROCEPHIN)  IV 200 mL/hr at 10/21/23 0541    Nutritional status     Body mass index is 21.68 kg/m.  Data Reviewed:   CBC: Recent Labs  Lab 10/19/23 1854 10/20/23 0125 10/21/23 0245  WBC 16.6* 14.4* 8.4  NEUTROABS 14.1*  --   --   HGB 10.0* 8.6* 8.6*  HCT 30.6* 26.2* 25.5*  MCV 78.9* 77.7* 76.6*  PLT 269 235 230   Basic Metabolic Panel: Recent Labs  Lab 10/19/23 1854 10/20/23 0125 10/21/23 0245  NA 138 137 137  K 4.7 4.3 4.0  CL 104 107 105  CO2 24 22 24   GLUCOSE 190* 196* 136*  BUN 48* 45* 42*  CREATININE 3.17* 2.88* 2.81*  CALCIUM 9.5 8.9 8.9  MG 2.1  --   --   PHOS  --   --  2.9   GFR: Estimated Creatinine Clearance: 18.1 mL/min (A) (by C-G formula based on SCr of 2.81 mg/dL (H)). Liver Function Tests: Recent Labs  Lab 10/19/23 1854  AST 15  ALT 10  ALKPHOS 70  BILITOT 0.5  PROT 7.6  ALBUMIN 3.5   No results for input(s): "LIPASE", "AMYLASE" in the last 168 hours. No results for input(s): "AMMONIA" in the last 168 hours. Coagulation Profile: No results for input(s): "INR", "PROTIME" in the last 168 hours. Cardiac Enzymes: No results for input(s):  "CKTOTAL", "CKMB", "CKMBINDEX", "TROPONINI" in the last 168 hours. BNP (last 3 results) No results for input(s): "PROBNP" in the last 8760 hours. HbA1C: Recent Labs    10/20/23 0125  HGBA1C 8.2*   CBG: Recent Labs  Lab 10/20/23 1140 10/20/23 1625 10/20/23 2103 10/21/23 0614 10/21/23 1100  GLUCAP 126* 139* 145* 125* 153*   Lipid Profile: No results for input(s): "CHOL", "HDL", "LDLCALC", "TRIG", "CHOLHDL", "LDLDIRECT" in the last 72 hours. Thyroid Function Tests: Recent Labs    10/19/23 1854  TSH 3.701   Anemia Panel: Recent Labs    10/20/23 0125  VITAMINB12 395  FERRITIN 65  TIBC 245*  IRON 19*   Sepsis Labs: Recent Labs  Lab 10/19/23 1851 10/19/23 2102  LATICACIDVEN 2.3* 1.2    Recent  Results (from the past 240 hour(s))  Resp panel by RT-PCR (RSV, Flu A&B, Covid) Anterior Nasal Swab     Status: None   Collection Time: 10/19/23  9:02 PM   Specimen: Anterior Nasal Swab  Result Value Ref Range Status   SARS Coronavirus 2 by RT PCR NEGATIVE NEGATIVE Final   Influenza A by PCR NEGATIVE NEGATIVE Final   Influenza B by PCR NEGATIVE NEGATIVE Final    Comment: (NOTE) The Xpert Xpress SARS-CoV-2/FLU/RSV plus assay is intended as an aid in the diagnosis of influenza from Nasopharyngeal swab specimens and should not be used as a sole basis for treatment. Nasal washings and aspirates are unacceptable for Xpert Xpress SARS-CoV-2/FLU/RSV testing.  Fact Sheet for Patients: BloggerCourse.com  Fact Sheet for Healthcare Providers: SeriousBroker.it  This test is not yet approved or cleared by the [place] FDA and has been authorized for detection and/or diagnosis of SARS-CoV-2 by FDA under an Emergency Use Authorization (EUA). This EUA will remain in effect (meaning this test can be used) for the duration of the COVID-19 declaration under Section 564(b)(1) of the Act, 21 U.S.C. section 360bbb-3(b)(1), unless the  authorization is terminated or revoked.     Resp Syncytial Virus by PCR NEGATIVE NEGATIVE Final    Comment: (NOTE) Fact Sheet for Patients: BloggerCourse.com  Fact Sheet for Healthcare Providers: SeriousBroker.it  This test is not yet approved or cleared by the [place] FDA and has been authorized for detection and/or diagnosis of SARS-CoV-2 by FDA under an Emergency Use Authorization (EUA). This EUA will remain in effect (meaning this test can be used) for the duration of the COVID-19 declaration under Section 564(b)(1) of the Act, 21 U.S.C. section 360bbb-3(b)(1), unless the authorization is terminated or revoked.  Performed at [facility], [address] [place], [place] [address]   Culture, blood (Routine X 2) w Reflex to ID Panel     Status: None (Preliminary result)   Collection Time: 10/20/23  1:23 AM   Specimen: BLOOD LEFT ARM  Result Value Ref Range Status   Specimen Description BLOOD LEFT ARM  Final   Special Requests   Final    BOTTLES DRAWN AEROBIC AND ANAEROBIC Blood Culture results may not be optimal due to an inadequate volume of blood received in culture bottles   Culture   Final    NO GROWTH 1 DAY Performed at [facility], [address], [place], [place] [address]    Report Status PENDING  Incomplete  Culture, blood (Routine X 2) w Reflex to ID Panel     Status: None (Preliminary result)   Collection Time: 10/20/23  1:23 AM   Specimen: BLOOD RIGHT HAND  Result Value Ref Range Status   Specimen Description BLOOD RIGHT HAND  Final   Special Requests   Final    BOTTLES DRAWN AEROBIC AND ANAEROBIC Blood Culture adequate volume   Culture   Final    NO GROWTH 1 DAY Performed at [facility], [address], [place], [place] [address]    Report Status PENDING  Incomplete  Urine Culture     Status: Abnormal   Collection Time: 10/20/23  2:08 AM   Specimen: Urine, Random  Result Value  Ref Range Status   Specimen Description URINE, RANDOM  Final   Special Requests NONE Reflexed from [phone]  Final   Culture (A)  Final    >=100,000 COLONIES/mL STREPTOCOCCUS AGALACTIAE TESTING AGAINST S. AGALACTIAE NOT ROUTINELY PERFORMED DUE TO PREDICTABILITY  OF AMP/PEN/VAN SUSCEPTIBILITY. Performed at [facility], [address], [place], [place] [address]    Report Status 10/21/2023 FINAL  Final         Radiology Studies: VAS US CAROTID  Result Date: 10/20/2023 Carotid Arterial Duplex Study Patient Name:  PATRICIK BOWLIN  Date of Exam:   10/20/2023 Medical Rec #: [id number]        Accession #:    [id number] Date of Birth: December 01, 1932       Patient Gender: M Patient Age:   89 years Exam Location:  [facility] Procedure:      VAS US CAROTID Referring Phys: Ulyess Blossom RATHORE --------------------------------------------------------------------------------  Indications:       Syncope. Risk Factors:      Hypertension, hyperlipidemia, Diabetes. Other Factors:     CKD IV. Limitations        Today's exam was limited due to constant movement of head,                    the patient's inability or unwillingness to cooperate and                    heavy calcification and the resulting shadowing. Comparison Study:  Prior carotid duplex done 11/02/21 indicating 40-59% right                    ICA stenosis and 1-39% left ICA stenosis. Performing Technologist: Sherren Kerns RVS  Examination Guidelines: A complete evaluation includes B-mode imaging, spectral Doppler, color Doppler, and power Doppler as needed of all accessible portions of each vessel. Bilateral testing is considered an integral part of a complete examination. Limited examinations for reoccurring indications may be performed as noted.  Right Carotid Findings: +----------+--------+--------+--------+------------------+---------+           PSV cm/sEDV cm/sStenosisPlaque DescriptionComments   +----------+--------+--------+--------+------------------+---------+ CCA Prox  87      10              homogeneous                 +----------+--------+--------+--------+------------------+---------+ CCA Distal121     12              homogeneous                 +----------+--------+--------+--------+------------------+---------+ ICA Prox  351     37      40-59%  calcific          Shadowing +----------+--------+--------+--------+------------------+---------+ ICA Mid   286     23                                tortuous  +----------+--------+--------+--------+------------------+---------+ ICA Distal81      15                                tortuous  +----------+--------+--------+--------+------------------+---------+ ECA       192     28                                          +----------+--------+--------+--------+------------------+---------+ +----------+--------+-------+--------+-------------------+           PSV cm/sEDV cmsDescribeArm Pressure (mmHG) +----------+--------+-------+--------+-------------------+ [id number]                                         +----------+--------+-------+--------+-------------------+ +---------+--------+--+--------+--+  VertebralPSV cm/s85EDV cm/s10 +---------+--------+--+--------+--+  Left Carotid Findings: +----------+--------+--------+--------+------------------+--------+           PSV cm/sEDV cm/sStenosisPlaque DescriptionComments +----------+--------+--------+--------+------------------+--------+ CCA Prox  140     17              heterogenous               +----------+--------+--------+--------+------------------+--------+ CCA Distal150     13              heterogenous               +----------+--------+--------+--------+------------------+--------+ ICA Prox  159     13      1-39%                              +----------+--------+--------+--------+------------------+--------+ ICA Mid    112     20                                tortuous +----------+--------+--------+--------+------------------+--------+ ICA Distal111     16                                tortuous +----------+--------+--------+--------+------------------+--------+ ECA       147     0                                          +----------+--------+--------+--------+------------------+--------+ +----------+--------+--------+--------+-------------------+           PSV cm/sEDV cm/sDescribeArm Pressure (mmHG) +----------+--------+--------+--------+-------------------+ Subclavian210                                         +----------+--------+--------+--------+-------------------+ +---------+--------+--+--------+--+ VertebralPSV cm/s66EDV cm/s11 +---------+--------+--+--------+--+   Summary: Right Carotid: Velocities in the right ICA are consistent with a 40-59%                stenosis. Higher velocities may be obscured by calcification. Left Carotid: Velocities in the left ICA are consistent with a 1-39% stenosis. Vertebrals:  Bilateral vertebral arteries demonstrate antegrade flow. Subclavians: Normal flow hemodynamics were seen in bilateral subclavian              arteries. *See table(s) above for measurements and observations.     Preliminary    ECHOCARDIOGRAM COMPLETE  Result Date: 10/20/2023    ECHOCARDIOGRAM REPORT   Patient Name:   ANVIT PASQUAL Date of Exam: 10/20/2023 Medical Rec #:  [id number]       Height:       71.0 in Accession #:    [id number]      Weight:       168.0 lb Date of Birth:  June 05, 1933      BSA:          1.958 m Patient Age:    89 years        BP:           158/81 mmHg Patient Gender: M               HR:           72 bpm. Exam Location:  Inpatient Procedure: 2D Echo, Color Doppler and Cardiac Doppler Indications:  Syncope  History:        Patient has no prior history of Echocardiogram examinations.                 Elevated Troponin, CKD and Carotid Disease,                  Signs/Symptoms:Syncope; Risk Factors:Diabetes, Dyslipidemia and                 Hypertension.  Sonographer:    Milbert Coulter Referring Phys: [id number] VASUNDHRA RATHORE IMPRESSIONS  1. Left ventricular ejection fraction, by estimation, is 60 to 65%. The left ventricle has normal function. The left ventricle has no regional wall motion abnormalities. There is moderate left ventricular hypertrophy. Left ventricular diastolic parameters are consistent with Grade I diastolic dysfunction (impaired relaxation).  2. Right ventricular systolic function is normal. The right ventricular size is normal.  3. Left atrial size was mildly dilated.  4. The mitral valve is abnormal. No evidence of mitral valve regurgitation. No evidence of mitral stenosis. Moderate mitral annular calcification.  5. The aortic valve is tricuspid. There is moderate calcification of the aortic valve. There is moderate thickening of the aortic valve. Aortic valve regurgitation is not visualized. Aortic valve sclerosis is present, with no evidence of aortic valve stenosis.  6. The inferior vena cava is dilated in size with >50% respiratory variability, suggesting right atrial pressure of 8 mmHg. FINDINGS  Left Ventricle: Left ventricular ejection fraction, by estimation, is 60 to 65%. The left ventricle has normal function. The left ventricle has no regional wall motion abnormalities. The left ventricular internal cavity size was normal in size. There is  moderate left ventricular hypertrophy. Left ventricular diastolic parameters are consistent with Grade I diastolic dysfunction (impaired relaxation). Right Ventricle: The right ventricular size is normal. No increase in right ventricular wall thickness. Right ventricular systolic function is normal. Left Atrium: Left atrial size was mildly dilated. Right Atrium: Right atrial size was normal in size. Pericardium: There is no evidence of pericardial effusion. Mitral Valve: The mitral valve is abnormal.  There is mild thickening of the mitral valve leaflet(s). There is mild calcification of the mitral valve leaflet(s). Moderate mitral annular calcification. No evidence of mitral valve regurgitation. No evidence  of mitral valve stenosis. Tricuspid Valve: The tricuspid valve is normal in structure. Tricuspid valve regurgitation is not demonstrated. No evidence of tricuspid stenosis. Aortic Valve: The aortic valve is tricuspid. There is moderate calcification of the aortic valve. There is moderate thickening of the aortic valve. Aortic valve regurgitation is not visualized. Aortic valve sclerosis is present, with no evidence of aortic valve stenosis. Aortic valve mean gradient measures 4.0 mmHg. Aortic valve peak gradient measures 6.8 mmHg. Aortic valve area, by VTI measures 3.18 cm. Pulmonic Valve: The pulmonic valve was normal in structure. Pulmonic valve regurgitation is not visualized. No evidence of pulmonic stenosis. Aorta: The aortic root is normal in size and structure. Venous: The inferior vena cava is dilated in size with greater than 50% respiratory variability, suggesting right atrial pressure of 8 mmHg. IAS/Shunts: No atrial level shunt detected by color flow Doppler.  LEFT VENTRICLE PLAX 2D LVIDd:         4.30 cm   Diastology LVIDs:         2.90 cm   LV e' medial:    7.62 cm/s LV PW:         1.50 cm   LV E/e' medial:  9.8 LV IVS:  1.50 cm   LV e' lateral:   11.20 cm/s LVOT diam:     2.00 cm   LV E/e' lateral: 6.7 LV SV:         96 LV SV Index:   49 LVOT Area:     3.14 cm  RIGHT VENTRICLE RV Basal diam:  3.40 cm RV S prime:     11.30 cm/s TAPSE (M-mode): 2.2 cm LEFT ATRIUM             Index        RIGHT ATRIUM           Index LA diam:        4.00 cm 2.04 cm/m   RA Area:     11.40 cm LA Vol (A2C):   69.2 ml 35.34 ml/m  RA Volume:   26.20 ml  13.38 ml/m LA Vol (A4C):   28.2 ml 14.40 ml/m LA Biplane Vol: 48.8 ml 24.92 ml/m  AORTIC VALVE AV Area (Vmax):    2.95 cm AV Area (Vmean):   3.19 cm AV  Area (VTI):     3.18 cm AV Vmax:           130.00 cm/s AV Vmean:          88.200 cm/s AV VTI:            0.303 m AV Peak Grad:      6.8 mmHg AV Mean Grad:      4.0 mmHg LVOT Vmax:         122.00 cm/s LVOT Vmean:        89.600 cm/s LVOT VTI:          0.307 m LVOT/AV VTI ratio: 1.01  AORTA Ao Root diam: 3.40 cm Ao Asc diam:  3.40 cm MITRAL VALVE MV Area (PHT): 2.63 cm     SHUNTS MV Decel Time: 288 msec     Systemic VTI:  0.31 m MV E velocity: 74.90 cm/s   Systemic Diam: 2.00 cm MV A velocity: 117.00 cm/s MV E/A ratio:  0.64 Charlton Haws MD Electronically signed by Charlton Haws MD Signature Date/Time: 10/20/2023/10:23:12 AM    Final    CT HEAD WO CONTRAST ( )  Result Date: 10/20/2023 CLINICAL DATA:  Recent syncopal episode EXAM: CT HEAD WITHOUT CONTRAST TECHNIQUE: Contiguous axial images were obtained from the base of the skull through the vertex without intravenous contrast. RADIATION DOSE REDUCTION: This exam was performed according to the departmental dose-optimization program which includes automated exposure control, adjustment of the mA and/or kV according to patient size and/or use of iterative reconstruction technique. COMPARISON:  04/26/2022 FINDINGS: Brain: No evidence of acute infarction, hemorrhage, hydrocephalus, extra-axial collection or mass lesion/mass effect. Chronic atrophic and ischemic changes are noted with evidence of ventricular dilatation. Vascular: No hyperdense vessel or unexpected calcification. Skull: Normal. Negative for fracture or focal lesion. Sinuses/Orbits: No acute finding. Other: None. IMPRESSION: Chronic atrophic and ischemic changes similar to that seen on prior exam. Stable ventricular dilatation Electronically Signed   By: Alcide Clever M.D.   On: 10/20/2023 00:22   DG Chest 2 View  Result Date: 10/19/2023 CLINICAL DATA:  Syncope, fatigue EXAM: CHEST - 2 VIEW COMPARISON:  04/26/2022 FINDINGS: Patchy airspace disease in the lower lobes, left greater than right concerning  for pneumonia. No effusions. Heart and mediastinal contours within normal limits. No acute bony abnormality. IMPRESSION: Bilateral lower lobe airspace opacities, left greater than right concerning for pneumonia. Electronically Signed   By: Caryn Bee  [place]over M.D.   On: 10/19/2023 19:48           LOS: 1 day   Time spent= 35 mins    Miguel Rota, MD [facility]  If 7PM-7AM, please contact night-coverage  10/21/2023, 12:02 PM

## 2023-10-21 NOTE — Evaluation (Signed)
Occupational Therapy Evaluation Patient Details Name: RHYDIAN ZEPP MRN: [id number] DOB: 07/06/1933 Today's Date: 10/21/2023   History of Present Illness Pt is an 89 y/o M presenting to ED on 12/7 with syncope, cough and vomiting, CXR with conerns for possible bil PNA. Admitted for CAP.    PMH includes mild dementia, HTN, HLD, insulin dependent DM2, CKD IV, PVD and R internal carotid artery stenosis on xarelto, colon CA s/p hemicolectomy and chemo in 2005   Clinical Impression   Pt reports ind at baseline with ADLs and functional mobility, lives with daughter and spouse who can assist at d/c. Pt currently needing set up - mod A for ADLs, min A for transfers with RW, cues for safety. Pt able to perform standing grooming task at sink, needing x1 seated rest break. See BP's below, pt asymptomatic throughout. Pt presenting with impairments listed below, will follow acutely. Recommend HHOT at d/c.   BP seated: 150/65 (88) BP standing : 151/62 (87) BP post transfer/ambulation in room:  115/60 (78)      If plan is discharge home, recommend the following: A little help with walking and/or transfers;A lot of help with bathing/dressing/bathroom;Assistance with cooking/housework;Direct supervision/assist for medications management;Direct supervision/assist for financial management;Assist for transportation;Help with stairs or ramp for entrance    Functional Status Assessment  Patient has had a recent decline in their functional status and demonstrates the ability to make significant improvements in function in a reasonable and predictable amount of time.  Equipment Recommendations  BSC/3in1    Recommendations for Other Services PT consult     Precautions / Restrictions Precautions Precautions: Fall Precaution Comments: watch BP Restrictions Weight Bearing Restrictions: No      Mobility Bed Mobility               General bed mobility comments: OOB in chair upon arrival and  departure    Transfers Overall transfer level: Needs assistance Equipment used: Rolling walker (2 wheels) Transfers: Sit to/from Stand Sit to Stand: Min assist                  Balance Overall balance assessment: Needs assistance Sitting-balance support: Feet supported Sitting balance-Leahy Scale: Good     Standing balance support: During functional activity, Reliant on assistive device for balance Standing balance-Leahy Scale: Poor Standing balance comment: reliant on external support                           ADL either performed or assessed with clinical judgement   ADL Overall ADL's : Needs assistance/impaired Eating/Feeding: Set up   Grooming: Minimal assistance;Oral care;Sitting;Standing Grooming Details (indicate cue type and reason): completed paritally in sitting due to fatigue Upper Body Bathing: Moderate assistance   Lower Body Bathing: Moderate assistance   Upper Body Dressing : Minimal assistance   Lower Body Dressing: Moderate assistance   Toilet Transfer: Minimal assistance;Ambulation;Regular Toilet   Toileting- Architect and Hygiene: Moderate assistance       Functional mobility during ADLs: Minimal assistance;Rolling walker (2 wheels)       Vision   Vision Assessment?: Vision impaired- to be further tested in functional context Additional Comments: hx of diabetic retinopathy, reports peripheral vision is harder to see out of, difficulty reading large font on menu and clock     Perception Perception: Not tested       Praxis Praxis: Not tested       Pertinent Vitals/Pain Pain Assessment Pain Assessment: No/denies  pain     Extremity/Trunk Assessment Upper Extremity Assessment Upper Extremity Assessment: Generalized weakness   Lower Extremity Assessment Lower Extremity Assessment: Defer to PT evaluation   Cervical / Trunk Assessment Cervical / Trunk Assessment: Normal   Communication  Communication Communication: No apparent difficulties   Cognition Arousal: Alert Behavior During Therapy: WFL for tasks assessed/performed Overall Cognitive Status: No family/caregiver present to determine baseline cognitive functioning                                 General Comments: pt oriented, follows commands, provides PLOF     General Comments  see note for BP measures    Exercises     Shoulder Instructions      Home Living Family/patient expects to be discharged to:: Private residence Living Arrangements: Spouse/significant other;Children (daughter) Available Help at Discharge: Family;Available 24 hours/day Type of Home: House Home Access: Stairs to enter [employer] of Steps: ~2? ( pt does not recall)   Home Layout: One level     Bathroom Shower/Tub: Walk-in shower         Home Equipment: Shower seat;Cane - single Librarian, academic (2 wheels)          Prior Functioning/Environment Prior Level of Function : Needs assist             Mobility Comments: no AD use, but said he "should use the cane" ADLs Comments: ind, daughter does meds        OT Problem List: Decreased strength;Decreased range of motion;Decreased activity tolerance;Impaired balance (sitting and/or standing);Decreased cognition;Decreased safety awareness;Cardiopulmonary status limiting activity      OT Treatment/Interventions: Self-care/ADL training;Therapeutic exercise;Energy conservation;DME and/or AE instruction;Therapeutic activities;Patient/family education;Balance training;Visual/perceptual remediation/compensation;Cognitive remediation/compensation    OT Goals(Current goals can be found in the care plan section) Acute Rehab OT Goals Patient Stated Goal: none stated OT Goal Formulation: With patient Time For Goal Achievement: 11/04/23 Potential to Achieve Goals: Good ADL Goals Pt Will Perform Upper Body Dressing: with  supervision;standing;sitting Pt Will Perform Lower Body Dressing: with supervision;sitting/lateral leans;sit to/from stand Pt Will Transfer to Toilet: with supervision;ambulating;regular height toilet Pt Will Perform Tub/Shower Transfer: with supervision;ambulating;Shower transfer;shower seat  OT Frequency: Min 1X/week    Co-evaluation              AM-PAC OT "6 Clicks" Daily Activity     Outcome Measure Help from another person eating meals?: A Little Help from another person taking care of personal grooming?: A Little Help from another person toileting, which includes using toliet, bedpan, or urinal?: A Little Help from another person bathing (including washing, rinsing, drying)?: A Lot Help from another person to put on and taking off regular upper body clothing?: A Little Help from another person to put on and taking off regular lower body clothing?: A Lot 6 Click Score: 16   End of Session Equipment Utilized During Treatment: Gait belt;Rolling walker (2 wheels) Nurse Communication: Mobility status  Activity Tolerance: Patient tolerated treatment well Patient left: in chair;with call bell/phone within reach;with chair alarm set  OT Visit Diagnosis: Unsteadiness on feet (R26.81);Other abnormalities of gait and mobility (R26.89);Muscle weakness (generalized) (M62.81)                Time: 0109-3235 OT Time Calculation (min): 33 min Charges:  OT General Charges $OT Visit: 1 Visit OT Evaluation $OT Eval Low Complexity: 1 Low OT Treatments $Self Care/Home Management : 8-22 mins  Carver Fila,  OTD, OTR/L SecureChat Preferred Acute Rehab [phone]   Dalphine Handing 10/21/2023, 4:23 PM

## 2023-10-22 ENCOUNTER — Inpatient Hospital Stay (HOSPITAL_COMMUNITY): Payer: Medicare PPO

## 2023-10-22 DIAGNOSIS — J189 Pneumonia, unspecified organism: Secondary | ICD-10-CM | POA: Diagnosis not present

## 2023-10-22 LAB — BASIC METABOLIC PANEL
Anion gap: 9 (ref 5–15)
BUN: 47 mg/dL — ABNORMAL HIGH (ref 8–23)
CO2: 23 mmol/L (ref 22–32)
Calcium: 8.8 mg/dL — ABNORMAL LOW (ref 8.9–10.3)
Chloride: 105 mmol/L (ref 98–111)
Creatinine, Ser: 3.17 mg/dL — ABNORMAL HIGH (ref 0.61–1.24)
GFR, Estimated: 18 mL/min — ABNORMAL LOW (ref >60)
Glucose, Bld: 154 mg/dL — ABNORMAL HIGH (ref 70–99)
Potassium: 4 mmol/L (ref 3.5–5.1)
Sodium: 137 mmol/L (ref 135–145)

## 2023-10-22 LAB — CBC
HCT: 23.7 % — ABNORMAL LOW (ref 39.0–52.0)
Hemoglobin: 7.9 g/dL — ABNORMAL LOW (ref 13.0–17.0)
MCH: 25.4 pg — ABNORMAL LOW (ref 26.0–34.0)
MCHC: 33.3 g/dL (ref 30.0–36.0)
MCV: 76.2 fL — ABNORMAL LOW (ref 80.0–100.0)
Platelets: 214 10*3/uL (ref 150–400)
RBC: 3.11 MIL/uL — ABNORMAL LOW (ref 4.22–5.81)
RDW: 14.6 % (ref 11.5–15.5)
WBC: 8.7 10*3/uL (ref 4.0–10.5)
nRBC: 0 % (ref 0.0–0.2)

## 2023-10-22 LAB — GLUCOSE, CAPILLARY
Glucose-Capillary: 156 mg/dL — ABNORMAL HIGH (ref 70–99)
Glucose-Capillary: 153 mg/dL — ABNORMAL HIGH (ref 70–99)
Glucose-Capillary: 131 mg/dL — ABNORMAL HIGH (ref 70–99)
Glucose-Capillary: 169 mg/dL — ABNORMAL HIGH (ref 70–99)
Glucose-Capillary: 149 mg/dL — ABNORMAL HIGH (ref 70–99)

## 2023-10-22 MED ORDER — SODIUM CHLORIDE 0.9 % IV SOLN: INTRAVENOUS | Status: AC

## 2023-10-22 MED ORDER — HYDRALAZINE HCL 50 MG PO TABS
100.0000 mg | ORAL_TABLET | Freq: Three times a day (TID) | ORAL | Status: DC
  Administered 2023-10-22 – 2023-10-25 (×10): 100 mg via ORAL
  Filled 2023-10-22 (×11): qty 2

## 2023-10-22 NOTE — Progress Notes (Signed)
PROGRESS NOTE    Jonathan Baird  ZOX:[id number] DOB: 01/06/1933 DOA: 10/19/2023 PCP: Georgianne Fick, MD    Brief Narrative:  89 year old with history of dementia, HTN, HLD, DM2, CKD 4, PVD, right internal carotid stenosis on Xarelto, colon cancer status post hemicolectomy and chemotherapy in 2005 comes to the ED with complaints of syncope, cough and vomiting.  Chest x-ray shows concerns of possible bilateral pneumonia.  Started on Rocephin, azithromycin.  Hospital course complicated by AKI due to poor oral intake.  Getting gentle hydration.  Ongoing PT OT evaluation.   Assessment & Plan:  Principal Problem:   CAP (community acquired pneumonia) Active Problems:   Essential hypertension   Mixed hyperlipidemia   Syncope   Acute renal failure superimposed on stage 4 chronic kidney disease (HCC)   Acute encephalopathy   Elevated troponin   Type 2 diabetes mellitus (HCC)     Community-acquired pneumonia Urinary tract infection Chest x-ray showing bilateral pneumonia.  COVID/RSV/flu are negative. Currently on Rocephin and azithromycin.  Urine cultures growing Streptococcus agalactiae, susceptibility pending   AKI on CKD stage IV Baseline creatinine 2.5, admission creatinine 3.17 which initially improved but due to lack of poor oral intake it started rising back again.  Today is 3.17 again.  Will give IV fluids   Vomiting and diarrhea No further episodes, resolved   Syncope, improved Unclear etiology.  Possible dehydration.  CT head negative.  Echocardiogram shows EF 65%, moderate LVH.  Carotid Doppler shows chronic stenosis right and left side but no significant flow compromise. Therapy evaluation-pending   Acute encephalopathy, significantly improved Possibly from dehydration underlying infection with probably underlying dementia.  TSH, CT head negative.  There is still concerns of possible upper extremity weakness, MRI brain ordered  Hypertension Urgency Holding home  HCTZ/lisinopril due to AKI Temporarily will change hydralazine to 3 times daily, and inrease to 100mg   Continue Norvasc 10 mg daily IV as needed   Mild troponin elevation Demand ischemia, echo as above   Chronic microcytic anemia with iron deficiency Hemoglobin is currently stable around baseline of 9.0.  Microcytosis noted with iron deficiency.   p.o. supplements with bowel regimen      Hyperlipidemia Continue Crestor.   Insulin-dependent type 2 diabetes A1c8.2.  He is not on long-acting insulin at home.  Placed on sensitive sliding scale insulin ACHS.   History of PVD and right internal carotid artery stenosis Resume home Xarelto (should really be on eliquis due to abnormal Renal Fx).  Repeat carotid Dopplers ordered given syncopal event.   DVT prophylaxis: SCDs Start: 10/19/23 2343 Code Status: Full Family Communication:   Awaiting renal function to improve in therapy evaluation.  Hopefully discharge in next 24-48 hours   Subjective: During my evaluation patient did not have any complaints.  He was sitting up in the chair.  Prior to that during therapy evaluation he noted that patient may have had right upper extremity weakness.  Examination: , General exam: Appears calm and comfortable, elderly frail Respiratory system: Clear to auscultation. Respiratory effort normal. Cardiovascular system: S1 & S2 heard, RRR. No JVD, murmurs, rubs, gallops or clicks. No pedal edema. Gastrointestinal system: Abdomen is nondistended, soft and nontender. No organomegaly or masses felt. Normal bowel sounds heard. Central nervous system: Alert and oriented. No focal neurological deficits. Extremities: Symmetric 5 x 5 power. Skin: No rashes, lesions or ulcers Psychiatry: Judgement and insight appear normal. Mood & affect appropriate.  Diet Orders (From admission, onward)     Start     Ordered   10/19/23 2343  Diet heart healthy/carb modified Room service  appropriate? Yes; Fluid consistency: Thin  Diet effective now       Question Answer Comment  Diet-HS Snack? Nothing   Room service appropriate? Yes   Fluid consistency: Thin      10/19/23 2349            Objective: Vitals:   10/22/23 0736 10/22/23 0900 10/22/23 1106 10/22/23 1242  BP:  (!) 191/67 (!) 189/66 (!) 154/115  Pulse: 69 65 (!) 49 71  Resp: 15 18 14 14   Temp:   98.9 F (37.2 C)   TempSrc:   Oral   SpO2: 100% 93% 100% 100%  Weight:      Height:        Intake/Output Summary (Last 24 hours) at 10/22/2023 1320 Last data filed at 10/22/2023 1241 Gross per 24 hour  Intake 1400 ml  Output 2550 ml  Net -1150 ml   Filed Weights   10/20/23 1806 10/21/23 0440 10/22/23 0445  Weight: 70.5 kg 70.5 kg 71.3 kg    Scheduled Meds:  amLODipine  10 mg Oral Daily   aspirin  81 mg Oral Daily   ferrous sulfate  325 mg Oral Q breakfast   hydrALAZINE  100 mg Oral TID   insulin aspart  0-5 Units Subcutaneous QHS   insulin aspart  0-9 Units Subcutaneous TID WC   rivaroxaban  2.5 mg Oral BID   rosuvastatin  10 mg Oral Daily   senna-docusate  2 tablet Oral QHS   sodium bicarbonate  650 mg Oral Daily   Continuous Infusions:  sodium chloride 75 mL/hr at 10/22/23 0904   azithromycin Stopped (10/21/23 2226)   cefTRIAXone (ROCEPHIN)  IV Stopped (10/21/23 2050)    Nutritional status     Body mass index is 21.92 kg/m.  Data Reviewed:   CBC: Recent Labs  Lab 10/19/23 1854 10/20/23 0125 10/21/23 0245 10/22/23 0232  WBC 16.6* 14.4* 8.4 8.7  NEUTROABS 14.1*  --   --   --   HGB 10.0* 8.6* 8.6* 7.9*  HCT 30.6* 26.2* 25.5* 23.7*  MCV 78.9* 77.7* 76.6* 76.2*  PLT 269 235 230 214   Basic Metabolic Panel: Recent Labs  Lab 10/19/23 1854 10/20/23 0125 10/21/23 0245 10/22/23 0232  NA 138 137 137 137  K 4.7 4.3 4.0 4.0  CL 104 107 105 105  CO2 24 22 24 23   GLUCOSE 190* 196* 136* 154*  BUN 48* 45* 42* 47*  CREATININE 3.17* 2.88* 2.81* 3.17*  CALCIUM 9.5 8.9 8.9  8.8*  MG 2.1  --   --   --   PHOS  --   --  2.9  --    GFR: Estimated Creatinine Clearance: 16.2 mL/min (A) (by C-G formula based on SCr of 3.17 mg/dL (H)). Liver Function Tests: Recent Labs  Lab 10/19/23 1854  AST 15  ALT 10  ALKPHOS 70  BILITOT 0.5  PROT 7.6  ALBUMIN 3.5   No results for input(s): "LIPASE", "AMYLASE" in the last 168 hours. No results for input(s): "AMMONIA" in the last 168 hours. Coagulation Profile: No results for input(s): "INR", "PROTIME" in the last 168 hours. Cardiac Enzymes: No results for input(s): "CKTOTAL", "CKMB", "CKMBINDEX", "TROPONINI" in the last 168 hours. BNP (last 3 results) No results for input(s): "PROBNP" in the last 8760 hours. HbA1C: Recent Labs    10/20/23 0125  HGBA1C 8.2*   CBG: Recent Labs  Lab 10/21/23 1611 10/21/23 2056 10/22/23 0550 10/22/23 0729 10/22/23 1111  GLUCAP 128* 140* 156* 153* 131*   Lipid Profile: No results for input(s): "CHOL", "HDL", "LDLCALC", "TRIG", "CHOLHDL", "LDLDIRECT" in the last 72 hours. Thyroid Function Tests: Recent Labs    10/19/23 1854  TSH 3.701   Anemia Panel: Recent Labs    10/20/23 0125  VITAMINB12 395  FERRITIN 65  TIBC 245*  IRON 19*   Sepsis Labs: Recent Labs  Lab 10/19/23 1851 10/19/23 2102  LATICACIDVEN 2.3* 1.2    Recent Results (from the past 240 hour(s))  Resp panel by RT-PCR (RSV, Flu A&B, Covid) Anterior Nasal Swab     Status: None   Collection Time: 10/19/23  9:02 PM   Specimen: Anterior Nasal Swab  Result Value Ref Range Status   SARS Coronavirus 2 by RT PCR NEGATIVE NEGATIVE Final   Influenza A by PCR NEGATIVE NEGATIVE Final   Influenza B by PCR NEGATIVE NEGATIVE Final    Comment: (NOTE) The Xpert Xpress SARS-CoV-2/FLU/RSV plus assay is intended as an aid in the diagnosis of influenza from Nasopharyngeal swab specimens and should not be used as a sole basis for treatment. Nasal washings and aspirates are unacceptable for Xpert Xpress  SARS-CoV-2/FLU/RSV testing.  Fact Sheet for Patients: BloggerCourse.com  Fact Sheet for Healthcare Providers: SeriousBroker.it  This test is not yet approved or cleared by the [place] FDA and has been authorized for detection and/or diagnosis of SARS-CoV-2 by FDA under an Emergency Use Authorization (EUA). This EUA will remain in effect (meaning this test can be used) for the duration of the COVID-19 declaration under Section 564(b)(1) of the Act, 21 U.S.C. section 360bbb-3(b)(1), unless the authorization is terminated or revoked.     Resp Syncytial Virus by PCR NEGATIVE NEGATIVE Final    Comment: (NOTE) Fact Sheet for Patients: BloggerCourse.com  Fact Sheet for Healthcare Providers: SeriousBroker.it  This test is not yet approved or cleared by the [place] FDA and has been authorized for detection and/or diagnosis of SARS-CoV-2 by FDA under an Emergency Use Authorization (EUA). This EUA will remain in effect (meaning this test can be used) for the duration of the COVID-19 declaration under Section 564(b)(1) of the Act, 21 U.S.C. section 360bbb-3(b)(1), unless the authorization is terminated or revoked.  Performed at [facility], [address], [place], [place] [address]   Culture, blood (Routine X 2) w Reflex to ID Panel     Status: None (Preliminary result)   Collection Time: 10/20/23  1:23 AM   Specimen: BLOOD LEFT ARM  Result Value Ref Range Status   Specimen Description BLOOD LEFT ARM  Final   Special Requests   Final    BOTTLES DRAWN AEROBIC AND ANAEROBIC Blood Culture results may not be optimal due to an inadequate volume of blood received in culture bottles   Culture   Final    NO GROWTH 2 DAYS Performed at [facility], [address] [place], [place] [address]    Report Status PENDING  Incomplete  Culture, blood (Routine X 2) w Reflex  to ID Panel     Status: None (Preliminary result)   Collection Time: 10/20/23  1:23 AM   Specimen: BLOOD RIGHT HAND  Result Value Ref Range Status   Specimen Description BLOOD RIGHT HAND  Final   Special Requests   Final    BOTTLES DRAWN AEROBIC AND ANAEROBIC Blood Culture adequate volume  Culture   Final    NO GROWTH 2 DAYS Performed at [facility], [address] [place], [place] [address]    Report Status PENDING  Incomplete  Urine Culture     Status: Abnormal   Collection Time: 10/20/23  2:08 AM   Specimen: Urine, Random  Result Value Ref Range Status   Specimen Description URINE, RANDOM  Final   Special Requests NONE Reflexed from [id number]  Final   Culture (A)  Final    >=100,000 COLONIES/mL STREPTOCOCCUS AGALACTIAE TESTING AGAINST S. AGALACTIAE NOT ROUTINELY PERFORMED DUE TO PREDICTABILITY OF AMP/PEN/VAN SUSCEPTIBILITY. Performed at [facility], [address] [place], [place] [address]    Report Status 10/21/2023 FINAL  Final         Radiology Studies: No results found.         LOS: 2 days   Time spent= 35 mins    Miguel Rota, MD [facility]  If 7PM-7AM, please contact night-coverage  10/22/2023, 1:20 PM

## 2023-10-22 NOTE — Evaluation (Signed)
Physical Therapy Evaluation Patient Details Name: Jonathan Baird MRN: [id number] DOB: [birth year] Today's Date: 10/22/2023  History of Present Illness  Pt is an 89 y/o M presenting to ED on 12/7 with syncope, cough and vomiting, CXR with conerns for possible bil PNA. Admitted for CAP.    PMH includes mild dementia, HTN, HLD, insulin dependent DM2, CKD IV, PVD and R internal carotid artery stenosis on xarelto, colon CA s/p hemicolectomy and chemo in 2005   Clinical Impression  Jonathan Baird is 89 y.o. male admitted with above HPI and diagnosis. Patient is currently limited by functional impairments below (see PT problem list). Patient lives with his spouse and daughter and is mod ind with SPC in home at baseline. Pt limited today by impaired coordination and weakness and required Mod assist for bed mob, sit<>stand, and +2 assist for bed>chair with RW today. Patient will benefit from continued skilled PT interventions to address impairments and progress independence with mobility. Patient will benefit from continued inpatient follow up therapy, <3 hours/day. Acute PT will follow and progress as able.         If plan is discharge home, recommend the following: A lot of help with walking and/or transfers;A lot of help with bathing/dressing/bathroom;Assistance with cooking/housework;Direct supervision/assist for medications management;Assist for transportation;Help with stairs or ramp for entrance   Can travel by private vehicle   No    Equipment Recommendations None recommended by PT  Recommendations for Other Services       Functional Status Assessment Patient has had a recent decline in their functional status and demonstrates the ability to make significant improvements in function in a reasonable and predictable amount of time.     Precautions / Restrictions Precautions Precautions: Fall Precaution Comments: watch BP Restrictions Weight Bearing Restrictions: No      Mobility   Bed Mobility Overal bed mobility: Needs Assistance Bed Mobility: Supine to Sit     Supine to sit: Mod assist, Used rails, HOB elevated     General bed mobility comments: step by step cues to sequence supine>sit EOB. hand over hand to reach for bed rail. Pt required mod assist to raise trunk upright, leaning Lt seated EOB and cues needed to scoot and palce feet on floor.    Transfers Overall transfer level: Needs assistance Equipment used: Rolling walker (2 wheels) Transfers: Sit to/from Stand, Bed to chair/wheelchair/BSC Sit to Stand: Mod assist   Step pivot transfers: Mod assist, +2 safety/equipment       General transfer comment: Mod assist to rise from EOB, pt unable to follow cues for bil UE hand placement on EOB to power up. Pt with posterior lean in standing. Mod +2 required for safety with stand step bed>chair, pt with difficulty lifting/coordinating steps for transfer.    Ambulation/Gait                  Stairs            Wheelchair Mobility     Tilt Bed    Modified Rankin (Stroke Patients Only)       Balance Overall balance assessment: Needs assistance Sitting-balance support: Feet supported Sitting balance-Leahy Scale: Good     Standing balance support: During functional activity, Reliant on assistive device for balance Standing balance-Leahy Scale: Poor Standing balance comment: reliant on external support                             Pertinent  Vitals/Pain Pain Assessment Pain Assessment: No/denies pain    Home Living Family/patient expects to be discharged to:: Private residence Living Arrangements: Spouse/significant other;Children (daughter) Available Help at Discharge: Family;Available 24 hours/day Type of Home: House Home Access: Stairs to enter Entrance Stairs-Rails: None Entrance Stairs-Number of Steps: ~2? ( pt does not recall)   Home Layout: One level Home Equipment: Shower seat;Cane - single Social worker (2 wheels) Additional Comments: pt reports doesn't go out much uses SPC in home mostly    Prior Function Prior Level of Function : Needs assist             Mobility Comments: uses SPC in home occasionally or furniture ADLs Comments: ind, daughter does meds     Extremity/Trunk Assessment   Upper Extremity Assessment Upper Extremity Assessment: Generalized weakness;RUE deficits/detail;LUE deficits/detail RUE Deficits / Details: grossly 3/5 or less with UE; finger to nose impaired bil but worse on Rt UE RUE Coordination: decreased gross motor;decreased fine motor LUE Deficits / Details: grossly 3+ or 4-/5; slightly stronger than Rt UE; finger to nose impaired bil but worse on Rt UE LUE Coordination: decreased gross motor;decreased fine motor    Lower Extremity Assessment Lower Extremity Assessment: Generalized weakness;LLE deficits/detail;RLE deficits/detail RLE Deficits / Details: grossly 3/5 or less with UE; heel to shin impaired bil RLE Sensation: history of peripheral neuropathy RLE Coordination: decreased gross motor;decreased fine motor LLE Deficits / Details: grossly 3+ or 4-/5; slightly stronger than Rt UE; heel to shin impaired bil LLE Sensation: decreased proprioception LLE Coordination: decreased gross motor;decreased fine motor    Cervical / Trunk Assessment Cervical / Trunk Assessment: Normal  Communication   Communication Communication: Difficulty following commands/understanding Cueing Techniques: Verbal cues;Tactile cues  Cognition Arousal: Alert Behavior During Therapy: WFL for tasks assessed/performed Overall Cognitive Status: No family/caregiver present to determine baseline cognitive functioning Area of Impairment: Following commands, Attention, Awareness, Problem solving, Safety/judgement                   Current Attention Level: Selective   Following Commands: Follows one step commands with increased time, Follows one step commands  consistently Safety/Judgement: Decreased awareness of deficits Awareness: Emergent Problem Solving: Slow processing, Decreased initiation, Difficulty sequencing, Requires verbal cues General Comments: Pt perseverating on needing to pee an dnot controlling it and being embarassed by this.        General Comments      Exercises     Assessment/Plan    PT Assessment Patient needs continued PT services  PT Problem List Decreased strength;Decreased activity tolerance;Decreased balance;Decreased mobility;Decreased cognition;Decreased knowledge of precautions;Cardiopulmonary status limiting activity       PT Treatment Interventions DME instruction;Gait training;Stair training;Functional mobility training;Therapeutic activities;Therapeutic exercise;Balance training;Neuromuscular re-education;Cognitive remediation;Patient/family education    PT Goals (Current goals can be found in the Care Plan section)  Acute Rehab PT Goals Patient Stated Goal: get better PT Goal Formulation: With patient Time For Goal Achievement: 11/05/23 Potential to Achieve Goals: Good    Frequency Min 1X/week     Co-evaluation               AM-PAC PT "6 Clicks" Mobility  Outcome Measure Help needed turning from your back to your side while in a flat bed without using bedrails?: A Little Help needed moving from lying on your back to sitting on the side of a flat bed without using bedrails?: A Lot Help needed moving to and from a bed to a chair (including a wheelchair)?: A Lot Help needed  standing up from a chair using your arms (e.g., wheelchair or bedside chair)?: A Lot Help needed to walk in hospital room?: A Lot Help needed climbing 3-5 steps with a railing? : Total 6 Click Score: 12    End of Session Equipment Utilized During Treatment: Gait belt Activity Tolerance: Patient tolerated treatment well Patient left: in chair;with call bell/phone within reach;with chair alarm set;with family/visitor  present Nurse Communication: Mobility status;Other (comment) (weakness and coordination deficits) PT Visit Diagnosis: Unsteadiness on feet (R26.81);Other abnormalities of gait and mobility (R26.89);Difficulty in walking, not elsewhere classified (R26.2);Other symptoms and signs involving the nervous system (R29.898)    Time: 1610-9604 PT Time Calculation (min) (ACUTE ONLY): 33 min   Charges:   PT Evaluation $PT Eval Moderate Complexity: 1 Mod PT Treatments $Therapeutic Activity: 8-22 mins PT General Charges $$ ACUTE PT VISIT: 1 Visit         Wynn Maudlin, DPT Acute Rehabilitation Services Office [phone]  10/22/23 11:55 AM

## 2023-10-22 NOTE — Plan of Care (Signed)

## 2023-10-22 NOTE — Plan of Care (Signed)
  Problem: Education: Goal: Ability to describe self-care measures that may prevent or decrease complications (Diabetes Survival Skills Education) will improve Outcome: Progressing Goal: Individualized Educational Video(s) Outcome: Progressing   Problem: Coping: Goal: Ability to adjust to condition or change in health will improve Outcome: Progressing   Problem: Fluid Volume: Goal: Ability to maintain a balanced intake and output will improve Outcome: Progressing   Problem: Health Behavior/Discharge Planning: Goal: Ability to identify and utilize available resources and services will improve Outcome: Progressing Goal: Ability to manage health-related needs will improve Outcome: Progressing   Problem: Metabolic: Goal: Ability to maintain appropriate glucose levels will improve Outcome: Progressing   Problem: Nutritional: Goal: Maintenance of adequate nutrition will improve Outcome: Progressing Goal: Progress toward achieving an optimal weight will improve Outcome: Progressing   Problem: Skin Integrity: Goal: Risk for impaired skin integrity will decrease Outcome: Progressing   Problem: Tissue Perfusion: Goal: Adequacy of tissue perfusion will improve Outcome: Progressing   Problem: Education: Goal: Knowledge of General Education information will improve Description: Including pain rating scale, medication(s)/side effects and non-pharmacologic comfort measures Outcome: Progressing   Problem: Health Behavior/Discharge Planning: Goal: Ability to manage health-related needs will improve Outcome: Progressing   Problem: Clinical Measurements: Goal: Ability to maintain clinical measurements within normal limits will improve Outcome: Progressing Goal: Will remain free from infection Outcome: Progressing Goal: Respiratory complications will improve Outcome: Progressing Goal: Cardiovascular complication will be avoided Outcome: Progressing   Problem: Activity: Goal:  Risk for activity intolerance will decrease Outcome: Progressing   Problem: Nutrition: Goal: Adequate nutrition will be maintained Outcome: Progressing   Problem: Coping: Goal: Level of anxiety will decrease Outcome: Progressing   Problem: Elimination: Goal: Will not experience complications related to bowel motility Outcome: Progressing Goal: Will not experience complications related to urinary retention Outcome: Progressing   Problem: Pain Management: Goal: General experience of comfort will improve Outcome: Progressing   Problem: Safety: Goal: Ability to remain free from injury will improve Outcome: Progressing   Problem: Skin Integrity: Goal: Risk for impaired skin integrity will decrease Outcome: Progressing

## 2023-10-22 NOTE — Progress Notes (Signed)
CCMD notified RN that patient had a 7 beat run of VTACH. RN rounded, patient is lying in bed, eating dinner. Patient verbalize he has no chest pain. Call bell within. RN to continue to monitor.

## 2023-10-22 NOTE — Progress Notes (Signed)
PT notified RN that patients R arm and R leg are weaker than L arm/leg during therapy session. RN performed neurovascular assessment and noted no deficits. RN did not note any facial drooping or slurred speech. Patient BP has been elevated and trendings 180s-200s during the shift. RN made rapid response aware. Patient is sitting in chair, chest pain free. MD made aware. Brain MRI ordered. MD to round.

## 2023-10-23 DIAGNOSIS — N179 Acute kidney failure, unspecified: Secondary | ICD-10-CM | POA: Diagnosis not present

## 2023-10-23 DIAGNOSIS — J189 Pneumonia, unspecified organism: Secondary | ICD-10-CM | POA: Diagnosis not present

## 2023-10-23 LAB — BASIC METABOLIC PANEL
Anion gap: 7 (ref 5–15)
BUN: 45 mg/dL — ABNORMAL HIGH (ref 8–23)
CO2: 21 mmol/L — ABNORMAL LOW (ref 22–32)
Calcium: 8.7 mg/dL — ABNORMAL LOW (ref 8.9–10.3)
Chloride: 108 mmol/L (ref 98–111)
Creatinine, Ser: 2.95 mg/dL — ABNORMAL HIGH (ref 0.61–1.24)
GFR, Estimated: 20 mL/min — ABNORMAL LOW (ref >60)
Glucose, Bld: 126 mg/dL — ABNORMAL HIGH (ref 70–99)
Potassium: 4 mmol/L (ref 3.5–5.1)
Sodium: 136 mmol/L (ref 135–145)

## 2023-10-23 LAB — CBC
HCT: 22.9 % — ABNORMAL LOW (ref 39.0–52.0)
Hemoglobin: 7.8 g/dL — ABNORMAL LOW (ref 13.0–17.0)
MCH: 26.1 pg (ref 26.0–34.0)
MCHC: 34.1 g/dL (ref 30.0–36.0)
MCV: 76.6 fL — ABNORMAL LOW (ref 80.0–100.0)
Platelets: 232 10*3/uL (ref 150–400)
RBC: 2.99 MIL/uL — ABNORMAL LOW (ref 4.22–5.81)
RDW: 14.6 % (ref 11.5–15.5)
WBC: 8.4 10*3/uL (ref 4.0–10.5)
nRBC: 0 % (ref 0.0–0.2)

## 2023-10-23 LAB — MAGNESIUM: Magnesium: 1.8 mg/dL (ref 1.7–2.4)

## 2023-10-23 LAB — GLUCOSE, CAPILLARY
Glucose-Capillary: 119 mg/dL — ABNORMAL HIGH (ref 70–99)
Glucose-Capillary: 155 mg/dL — ABNORMAL HIGH (ref 70–99)
Glucose-Capillary: 135 mg/dL — ABNORMAL HIGH (ref 70–99)
Glucose-Capillary: 135 mg/dL — ABNORMAL HIGH (ref 70–99)

## 2023-10-23 MED ORDER — MAGNESIUM SULFATE 2 GM/50ML IV SOLN
2.0000 g | Freq: Once | INTRAVENOUS | Status: AC
  Administered 2023-10-23: 2 g via INTRAVENOUS
  Filled 2023-10-23: qty 50

## 2023-10-23 MED ORDER — SODIUM CHLORIDE 0.9 % IV SOLN: INTRAVENOUS | Status: DC

## 2023-10-23 NOTE — Progress Notes (Signed)
Occupational Therapy Treatment Patient Details Name: Jonathan Baird MRN: [id number] DOB: Sep 28, 1933 Today's Date: 10/23/2023   History of present illness Pt is an 89 y/o M presenting to ED on 12/7 with syncope, cough and vomiting, CXR with conerns for possible bil PNA. Admitted for CAP.    PMH includes mild dementia, HTN, HLD, insulin dependent DM2, CKD IV, PVD and R internal carotid artery stenosis on xarelto, colon CA s/p hemicolectomy and chemo in 2005   OT comments  Pt making slow progress towards goals, needing up to mod A this session, able to pivot chair > bed, pt with strong posterior lean in standing, cues for hand placement. Pt able to complete seated UB/LB bathing task with up to set up - mod A, needs frequent cues to locate items due to visual impairments. Pt presenting with impairments listed below, will follow acutely. Patient will benefit from continued inpatient follow up therapy, <3 hours/day to maximize safety/ind with ADL/functional mobility.       If plan is discharge home, recommend the following:  A little help with walking and/or transfers;A lot of help with bathing/dressing/bathroom;Assistance with cooking/housework;Direct supervision/assist for medications management;Direct supervision/assist for financial management;Assist for transportation;Help with stairs or ramp for entrance   Equipment Recommendations  BSC/3in1    Recommendations for Other Services PT consult    Precautions / Restrictions Precautions Precautions: Fall Precaution Comments: watch BP Restrictions Weight Bearing Restrictions: No       Mobility Bed Mobility Overal bed mobility: Needs Assistance Bed Mobility: Sit to Supine       Sit to supine: Min assist   General bed mobility comments: cues for alignment once in supine    Transfers Overall transfer level: Needs assistance Equipment used: Rolling walker (2 wheels) Transfers: Sit to/from Stand, Bed to chair/wheelchair/BSC Sit to  Stand: Mod assist           General transfer comment: mod A from chair surface, pt with posterior lean     Balance Overall balance assessment: Needs assistance Sitting-balance support: Feet supported Sitting balance-Leahy Scale: Good     Standing balance support: During functional activity, Reliant on assistive device for balance Standing balance-Leahy Scale: Poor Standing balance comment: reliant on external support                           ADL either performed or assessed with clinical judgement   ADL Overall ADL's : Needs assistance/impaired         Upper Body Bathing: Minimal assistance Upper Body Bathing Details (indicate cue type and reason): seated at sink Lower Body Bathing: Moderate assistance Lower Body Bathing Details (indicate cue type and reason): seated at sink         Toilet Transfer: Moderate assistance;Stand-pivot;BSC/3in1 Statistician Details (indicate cue type and reason): simulated chair > bed         Functional mobility during ADLs: Moderate assistance;Rolling walker (2 wheels)      Extremity/Trunk Assessment Upper Extremity Assessment Upper Extremity Assessment: Generalized weakness   Lower Extremity Assessment Lower Extremity Assessment: Defer to PT evaluation        Vision   Vision Assessment?: Vision impaired- to be further tested in functional context Additional Comments: hx of diabetic retinopathy, reports peripheral vision is harder to see out of, difficulty reading large font on menu and clock   Perception Perception Perception: Not tested   Praxis Praxis Praxis: Not tested    Cognition Arousal: Alert Behavior During Therapy:  WFL for tasks assessed/performed Overall Cognitive Status: No family/caregiver present to determine baseline cognitive functioning Area of Impairment: Following commands, Attention, Awareness, Problem solving, Safety/judgement                   Current Attention Level:  Selective   Following Commands: Follows one step commands with increased time, Follows one step commands consistently Safety/Judgement: Decreased awareness of deficits Awareness: Emergent Problem Solving: Slow processing, Decreased initiation, Difficulty sequencing, Requires verbal cues          Exercises      Shoulder Instructions       General Comments VSS    Pertinent Vitals/ Pain       Pain Assessment Pain Assessment: No/denies pain  Home Living                                          Prior Functioning/Environment              Frequency  Min 1X/week        Progress Toward Goals  OT Goals(current goals can now be found in the care plan section)  Progress towards OT goals: Progressing toward goals  Acute Rehab OT Goals Patient Stated Goal: none stated OT Goal Formulation: With patient Time For Goal Achievement: 11/04/23 Potential to Achieve Goals: Good ADL Goals Pt Will Perform Upper Body Dressing: with supervision;standing;sitting Pt Will Perform Lower Body Dressing: with supervision;sitting/lateral leans;sit to/from stand Pt Will Transfer to Toilet: with supervision;ambulating;regular height toilet Pt Will Perform Tub/Shower Transfer: with supervision;ambulating;Shower transfer;shower seat  Plan      Co-evaluation                 AM-PAC OT "6 Clicks" Daily Activity     Outcome Measure   Help from another person eating meals?: A Little Help from another person taking care of personal grooming?: A Little Help from another person toileting, which includes using toliet, bedpan, or urinal?: A Lot Help from another person bathing (including washing, rinsing, drying)?: A Lot Help from another person to put on and taking off regular upper body clothing?: A Little Help from another person to put on and taking off regular lower body clothing?: A Lot 6 Click Score: 15    End of Session Equipment Utilized During Treatment: Gait  belt;Rolling walker (2 wheels)  OT Visit Diagnosis: Unsteadiness on feet (R26.81);Other abnormalities of gait and mobility (R26.89);Muscle weakness (generalized) (M62.81)   Activity Tolerance Patient tolerated treatment well   Patient Left in bed;with call bell/phone within reach;with bed alarm set   Nurse Communication Mobility status        Time: 1310-1341 OT Time Calculation (min): 31 min  Charges: OT General Charges $OT Visit: 1 Visit OT Treatments $Self Care/Home Management : 23-37 mins  Carver Fila, OTD, OTR/L [employer] Preferred Acute Rehab [phone]   Carver Fila Koonce 10/23/2023, 4:33 PM

## 2023-10-23 NOTE — Progress Notes (Addendum)
PROGRESS NOTE    Jonathan Baird  ZOX:[id number] DOB: April 30, 1933 DOA: 10/19/2023 PCP: Georgianne Fick, MD    Brief Narrative:  89 year old with history of dementia, HTN, HLD, DM2, CKD 4, PVD, right internal carotid stenosis on Xarelto, colon cancer status post hemicolectomy and chemotherapy in 2005 comes to the ED with complaints of syncope, cough and vomiting.  Chest x-ray shows concerns of possible bilateral pneumonia.  Started on Rocephin, azithromycin.  Hospital course complicated by AKI due to poor oral intake.  PT/OT recommends SNF.   Assessment & Plan:  Principal Problem:   CAP (community acquired pneumonia) Active Problems:   Essential hypertension   Mixed hyperlipidemia   Syncope   Acute renal failure superimposed on stage 4 chronic kidney disease (HCC)   Acute encephalopathy   Elevated troponin   Type 2 diabetes mellitus (HCC)     Community-acquired pneumonia Urinary tract infection Chest x-ray showing bilateral pneumonia - COVID/RSV/flu are negative. - Urine cultures growing Streptococcus agalactiae -on IV abx   AKI on CKD stage IV Baseline creatinine 2.5, admission creatinine 3.17 which initially improved but due to lack of poor oral intake it started rising back again -improved on IVF -encourage PO intake   Vomiting and diarrhea No further episodes, resolved   Syncope, improved Unclear etiology.  Possible dehydration.  CT head negative.  Echocardiogram shows EF 65%, moderate LVH.  Carotid Doppler shows chronic stenosis right and left side but no significant flow compromise. Therapy evaluation-pending   Acute encephalopathy, significantly improved Possibly from dehydration underlying infection with probably underlying dementia.  TSH, CT head negative.  There is still concerns of possible upper extremity weakness -MRI brain shows chronic issues  Hypertension Urgency Holding home HCTZ/lisinopril due to AKI Temporarily will change hydralazine to 3 times  daily, and increase to 100mg   Continue Norvasc 10 mg daily   Mild troponin elevation Demand ischemia, echo as above   Chronic microcytic anemia with iron deficiency -trend     Hyperlipidemia Continue Crestor.   Insulin-dependent type 2 diabetes A1c8.2.  He is not on long-acting insulin at home.  Placed on sensitive sliding scale insulin ACHS.   History of PVD and right internal carotid artery stenosis Resume home Xarelto -will need follow up of right carotid   DVT prophylaxis: SCDs Start: 10/19/23 2343 Code Status: Full Family Communication:   SNF placement?  Await TOC   Subjective: In chair, feeling better  Examination:  General: Appearance:    elderly male in no acute distress     Lungs:     respirations unlabored  Heart:    Normal heart rate.    MS:   All extremities are intact.   Neurologic:   Alert to person, and place but not year                   Diet Orders (From admission, onward)     Start     Ordered   10/19/23 2343  Diet heart healthy/carb modified Room service appropriate? Yes; Fluid consistency: Thin  Diet effective now       Question Answer Comment  Diet-HS Snack? Nothing   Room service appropriate? Yes   Fluid consistency: Thin      10/19/23 2349            Objective: Vitals:   10/23/23 0419 10/23/23 0423 10/23/23 0424 10/23/23 0715  BP: 131/66   (!) 170/90  Pulse: 81 65  68  Resp: 18 18  18   Temp: 99.1 F (  37.3 C) 99.1 F (37.3 C)  97.9 F (36.6 C)  TempSrc: Oral  Oral Oral  SpO2: 100% 98%  100%  Weight: 72.3 kg     Height:        Intake/Output Summary (Last 24 hours) at 10/23/2023 1034 Last data filed at 10/23/2023 0908 Gross per 24 hour  Intake 1210 ml  Output 2050 ml  Net -840 ml   Filed Weights   10/21/23 0440 10/22/23 0445 10/23/23 0419  Weight: 70.5 kg 71.3 kg 72.3 kg    Scheduled Meds:  amLODipine  10 mg Oral Daily   aspirin  81 mg Oral Daily   ferrous sulfate  325 mg Oral Q breakfast    hydrALAZINE  100 mg Oral TID   insulin aspart  0-5 Units Subcutaneous QHS   insulin aspart  0-9 Units Subcutaneous TID WC   rivaroxaban  2.5 mg Oral BID   rosuvastatin  10 mg Oral Daily   senna-docusate  2 tablet Oral QHS   sodium bicarbonate  650 mg Oral Daily   Continuous Infusions:  azithromycin Stopped (10/22/23 2213)   cefTRIAXone (ROCEPHIN)  IV Stopped (10/22/23 2135)    Nutritional status     Body mass index is 22.23 kg/m.  Data Reviewed:   CBC: Recent Labs  Lab 10/19/23 1854 10/20/23 0125 10/21/23 0245 10/22/23 0232 10/23/23 0246  WBC 16.6* 14.4* 8.4 8.7 8.4  NEUTROABS 14.1*  --   --   --   --   HGB 10.0* 8.6* 8.6* 7.9* 7.8*  HCT 30.6* 26.2* 25.5* 23.7* 22.9*  MCV 78.9* 77.7* 76.6* 76.2* 76.6*  PLT 269 235 230 214 232   Basic Metabolic Panel: Recent Labs  Lab 10/19/23 1854 10/20/23 0125 10/21/23 0245 10/22/23 0232 10/23/23 0246  NA 138 137 137 137 136  K 4.7 4.3 4.0 4.0 4.0  CL 104 107 105 105 108  CO2 24 22 24 23  21*  GLUCOSE 190* 196* 136* 154* 126*  BUN 48* 45* 42* 47* 45*  CREATININE 3.17* 2.88* 2.81* 3.17* 2.95*  CALCIUM 9.5 8.9 8.9 8.8* 8.7*  MG 2.1  --   --   --  1.8  PHOS  --   --  2.9  --   --    GFR: Estimated Creatinine Clearance: 17.7 mL/min (A) (by C-G formula based on SCr of 2.95 mg/dL (H)). Liver Function Tests: Recent Labs  Lab 10/19/23 1854  AST 15  ALT 10  ALKPHOS 70  BILITOT 0.5  PROT 7.6  ALBUMIN 3.5   No results for input(s): "LIPASE", "AMYLASE" in the last 168 hours. No results for input(s): "AMMONIA" in the last 168 hours. Coagulation Profile: No results for input(s): "INR", "PROTIME" in the last 168 hours. Cardiac Enzymes: No results for input(s): "CKTOTAL", "CKMB", "CKMBINDEX", "TROPONINI" in the last 168 hours. BNP (last 3 results) No results for input(s): "PROBNP" in the last 8760 hours. HbA1C: No results for input(s): "HGBA1C" in the last 72 hours.  CBG: Recent Labs  Lab 10/22/23 0729 10/22/23 1111  10/22/23 1535 10/22/23 2056 10/23/23 0558  GLUCAP 153* 131* 169* 149* 119*   Lipid Profile: No results for input(s): "CHOL", "HDL", "LDLCALC", "TRIG", "CHOLHDL", "LDLDIRECT" in the last 72 hours. Thyroid Function Tests: No results for input(s): "TSH", "T4TOTAL", "FREET4", "T3FREE", "THYROIDAB" in the last 72 hours.  Anemia Panel: No results for input(s): "VITAMINB12", "FOLATE", "FERRITIN", "TIBC", "IRON", "RETICCTPCT" in the last 72 hours.  Sepsis Labs: Recent Labs  Lab 10/19/23 1851 10/19/23 2102  LATICACIDVEN 2.3*  1.2    Recent Results (from the past 240 hour(s))  Resp panel by RT-PCR (RSV, Flu A&B, Covid) Anterior Nasal Swab     Status: None   Collection Time: 10/19/23  9:02 PM   Specimen: Anterior Nasal Swab  Result Value Ref Range Status   SARS Coronavirus 2 by RT PCR NEGATIVE NEGATIVE Final   Influenza A by PCR NEGATIVE NEGATIVE Final   Influenza B by PCR NEGATIVE NEGATIVE Final    Comment: (NOTE) The Xpert Xpress SARS-CoV-2/FLU/RSV plus assay is intended as an aid in the diagnosis of influenza from Nasopharyngeal swab specimens and should not be used as a sole basis for treatment. Nasal washings and aspirates are unacceptable for Xpert Xpress SARS-CoV-2/FLU/RSV testing.  Fact Sheet for Patients: BloggerCourse.com  Fact Sheet for Healthcare Providers: SeriousBroker.it  This test is not yet approved or cleared by the [place] FDA and has been authorized for detection and/or diagnosis of SARS-CoV-2 by FDA under an Emergency Use Authorization (EUA). This EUA will remain in effect (meaning this test can be used) for the duration of the COVID-19 declaration under Section 564(b)(1) of the Act, 21 U.S.C. section 360bbb-3(b)(1), unless the authorization is terminated or revoked.     Resp Syncytial Virus by PCR NEGATIVE NEGATIVE Final    Comment: (NOTE) Fact Sheet for  Patients: BloggerCourse.com  Fact Sheet for Healthcare Providers: SeriousBroker.it  This test is not yet approved or cleared by the [place] FDA and has been authorized for detection and/or diagnosis of SARS-CoV-2 by FDA under an Emergency Use Authorization (EUA). This EUA will remain in effect (meaning this test can be used) for the duration of the COVID-19 declaration under Section 564(b)(1) of the Act, 21 U.S.C. section 360bbb-3(b)(1), unless the authorization is terminated or revoked.  Performed at [facility], [address], [place], [place] [address]   Culture, blood (Routine X 2) w Reflex to ID Panel     Status: None (Preliminary result)   Collection Time: 10/20/23  1:23 AM   Specimen: BLOOD LEFT ARM  Result Value Ref Range Status   Specimen Description BLOOD LEFT ARM  Final   Special Requests   Final    BOTTLES DRAWN AEROBIC AND ANAEROBIC Blood Culture results may not be optimal due to an inadequate volume of blood received in culture bottles   Culture   Final    NO GROWTH 3 DAYS Performed at [facility], [address], [place], [place] [address]    Report Status PENDING  Incomplete  Culture, blood (Routine X 2) w Reflex to ID Panel     Status: None (Preliminary result)   Collection Time: 10/20/23  1:23 AM   Specimen: BLOOD RIGHT HAND  Result Value Ref Range Status   Specimen Description BLOOD RIGHT HAND  Final   Special Requests   Final    BOTTLES DRAWN AEROBIC AND ANAEROBIC Blood Culture adequate volume   Culture   Final    NO GROWTH 3 DAYS Performed at [facility], [address] [place], [place] [address]    Report Status PENDING  Incomplete  Urine Culture     Status: Abnormal   Collection Time: 10/20/23  2:08 AM   Specimen: Urine, Random  Result Value Ref Range Status   Specimen Description URINE, RANDOM  Final   Special Requests NONE Reflexed from [id number]  Final   Culture (A)   Final    >=100,000 COLONIES/mL STREPTOCOCCUS AGALACTIAE TESTING AGAINST S. AGALACTIAE NOT  ROUTINELY PERFORMED DUE TO PREDICTABILITY OF AMP/PEN/VAN SUSCEPTIBILITY. Performed at [facility], [address], [place], [place] [address]    Report Status 10/21/2023 FINAL  Final         Radiology Studies: MR BRAIN WO CONTRAST  Result Date: 10/22/2023 CLINICAL DATA:  Transient ischemic attack. EXAM: MRI HEAD WITHOUT CONTRAST TECHNIQUE: Multiplanar, multiecho pulse sequences of the brain and surrounding structures were obtained without intravenous contrast. COMPARISON:  Head CT October 20, 2023. FINDINGS: Brain: No acute infarction, hemorrhage, extra-axial collection or mass lesion. Small focus of hyperintensity in the right frontal lobe on the axial DWI has no correlate on the coronal DWI or ADC maps. 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 Scattered and confluent foci of T2 hyperintensity are seen within the white matter the cerebral hemispheres and within the pons, nonspecific. Prominence of the supratentorial ventricles and sylvian fissures with relative preservation the high convexity sulci and decreased callosal angle, unchanged. Vascular: Normal flow voids. Skull and upper cervical spine: Normal marrow signal. Sinuses/Orbits: Negative. Other: None. IMPRESSION: 1. No acute intracranial abnormality. 2. 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 3. Moderate chronic microvascular ischemic changes of the white matter. 4. Prominence of the supratentorial ventricles and Sylvian fissures with relative preservation of the high convexity sulci and decreased callosal angle, unchanged. Findings may be seen in the setting of normal pressure hydrocephalus. Electronically Signed   By: Baldemar Lenis M.D.   On: 10/22/2023 15:21           LOS: 3 days   Time spent= 35 mins    Joseph Art, DO [facility]  If  7PM-7AM, please contact night-coverage  10/23/2023, 10:34 AM

## 2023-10-23 NOTE — NC FL2 (Signed)
[place] MEDICAID FL2 LEVEL OF CARE FORM     IDENTIFICATION  Patient Name: ISAMI TORO Birthdate: 29-Mar-1933 Sex: male Admission Date (Current Location): 10/19/2023  [facility] and [place] Number:  Producer, television/film/video and Address:  The [place], [address], [place], [place] [address]      Provider Number: [id number]  Attending Physician Name and Address:  Joseph Art, DO  Relative Name and Phone Number:       Current Level of Care: Hospital Recommended Level of Care: Skilled Nursing Facility Prior Approval Number:    Date Approved/Denied:   PASRR Number: [id number]  Discharge Plan: SNF    Current Diagnoses: Patient Active Problem List   Diagnosis Date Noted   Syncope 10/19/2023   CAP (community acquired pneumonia) 10/19/2023   Acute renal failure superimposed on stage 4 chronic kidney disease (HCC) 10/19/2023   Acute encephalopathy 10/19/2023   Elevated troponin 10/19/2023   Type 2 diabetes mellitus (HCC) 10/19/2023   History of vitrectomy 04/30/2022   Abnormal gait 02/13/2022   Altered mental status 02/13/2022   Unspecified visual loss 02/13/2022   Stage 3b chronic kidney disease (HCC) 09/25/2021   Stenosis of carotid artery 09/25/2021   Essential hypertension 01/17/2021   Chronic kidney disease due to hypertension 01/17/2021   Diabetic renal disease (HCC) 01/17/2021   Diabetic retinopathy associated with type 2 diabetes mellitus (HCC) 01/17/2021   Disorder of arteries and arterioles, unspecified (HCC) 01/17/2021   Drug-induced hypoglycemia without coma 01/17/2021   Adult general medical exam 01/17/2021   Hyperglycemia due to type 2 diabetes mellitus (HCC) 01/17/2021   Long term (current) use of insulin (HCC) 01/17/2021   Mixed hyperlipidemia 01/17/2021   Peripheral vascular disease (HCC) 01/17/2021   Controlled diabetes mellitus with stable proliferative retinopathy of both eyes (HCC) 08/10/2020   Right epiretinal membrane  08/10/2020   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 degeneration of both eyes with subfoveal involvement 08/10/2020   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/10/2020   Colon cancer (HCC) 10/12/2004    Orientation RESPIRATION BLADDER Height & Weight     Self, Time, Situation, Place  Normal Incontinent, External catheter Weight: 159 lb 6.3 oz (72.3 kg) Height:  5\' 11"  (180.3 cm)  BEHAVIORAL SYMPTOMS/MOOD NEUROLOGICAL BOWEL NUTRITION STATUS      Continent Diet (See dc summary)  AMBULATORY STATUS COMMUNICATION OF NEEDS Skin   Extensive Assist Verbally Normal                       Personal Care Assistance Level of Assistance  Bathing, Feeding, Dressing Bathing Assistance: Limited assistance Feeding assistance: Limited assistance Dressing Assistance: Limited assistance     Functional Limitations Info  Sight, Hearing, Speech Sight Info: Impaired (Impaired both eyes) Hearing Info: Adequate Speech Info: Adequate    SPECIAL CARE FACTORS FREQUENCY  PT (By licensed PT), OT (By licensed OT)     PT Frequency: 5x week OT Frequency: 5x week            Contractures Contractures Info: Not present    Additional Factors Info  Code Status, Insulin Sliding Scale Code Status Info: Full     Insulin Sliding Scale Info: See dc summary       Current Medications (10/23/2023):  This is the current hospital active medication list Current Facility-Administered Medications  Medication Dose Route Frequency Provider Last Rate Last Admin   0.9 %  sodium chloride infusion   Intravenous Continuous Amin, Ankit C, MD 75  mL/hr at 10/22/23 0904 New Bag at 10/22/23 0904   acetaminophen (TYLENOL) tablet 650 mg  650 mg Oral Q6H PRN John Giovanni, MD   650 mg at 10/20/23 0212   Or   acetaminophen (TYLENOL) suppository 650 mg  650 mg Rectal Q6H PRN John Giovanni, MD       amLODipine (NORVASC) tablet 10 mg  10 mg Oral Daily Amin, Ankit C, MD   10 mg at 10/22/23 5188   aspirin chewable  tablet 81 mg  81 mg Oral Daily Amin, Ankit C, MD   81 mg at 10/22/23 0905   azithromycin (ZITHROMAX) 500 mg in sodium chloride 0.9 % 250 mL IVPB  500 mg Intravenous Q24H John Giovanni, MD   Stopped at 10/22/23 2213   cefTRIAXone (ROCEPHIN) 2 g in sodium chloride 0.9 % 100 mL IVPB  2 g Intravenous Q24H John Giovanni, MD   Stopped at 10/22/23 2135   ferrous sulfate tablet 325 mg  325 mg Oral Q breakfast Amin, Ankit C, MD   325 mg at 10/23/23 0524   guaiFENesin (ROBITUSSIN) 100 MG/5ML liquid 5 mL  5 mL Oral Q4H PRN Amin, Ankit C, MD       hydrALAZINE (APRESOLINE) injection 10 mg  10 mg Intravenous Q4H PRN Amin, Ankit C, MD       hydrALAZINE (APRESOLINE) tablet 100 mg  100 mg Oral TID Amin, Ankit C, MD   100 mg at 10/22/23 2059   insulin aspart (novoLOG) injection 0-5 Units  0-5 Units Subcutaneous QHS John Giovanni, MD       insulin aspart (novoLOG) injection 0-9 Units  0-9 Units Subcutaneous TID WC John Giovanni, MD   2 Units at 10/22/23 1605   ipratropium-albuterol (DUONEB) 0.5-2.5 (3) MG/3ML nebulizer solution 3 mL  3 mL Nebulization Q4H PRN Amin, Ankit C, MD       metoprolol tartrate (LOPRESSOR) injection 5 mg  5 mg Intravenous Q4H PRN Amin, Ankit C, MD       ondansetron (ZOFRAN) injection 4 mg  4 mg Intravenous Q6H PRN Amin, Ankit C, MD       rivaroxaban (XARELTO) tablet 2.5 mg  2.5 mg Oral BID Amin, Ankit C, MD   2.5 mg at 10/22/23 2100   rosuvastatin (CRESTOR) tablet 10 mg  10 mg Oral Daily John Giovanni, MD   10 mg at 10/22/23 4166   senna-docusate (Senokot-S) tablet 1 tablet  1 tablet Oral QHS PRN Amin, Ankit C, MD       senna-docusate (Senokot-S) tablet 2 tablet  2 tablet Oral QHS Amin, Ankit C, MD   2 tablet at 10/22/23 2100   sodium bicarbonate tablet 650 mg  650 mg Oral Daily John Giovanni, MD   650 mg at 10/22/23 0904   traZODone (DESYREL) tablet 50 mg  50 mg Oral QHS PRN Amin, Ankit C, MD   50 mg at 10/22/23 2100     Discharge Medications: Please see  discharge summary for a list of discharge medications.  Relevant Imaging Results:  Relevant Lab Results:   Additional Information SS# [id number]  Michaela Corner, LCSWA

## 2023-10-23 NOTE — TOC Progression Note (Addendum)
Transition of Care [facility]) - Progression Note    Patient Details  Name: Jonathan Baird MRN: [id number] Date of Birth: [birth year]  [facility]) CM/SW Contact  Michaela Corner, [place] Phone Number: 10/23/2023, 9:52 AM  Clinical Narrative:   CSW spoke with pt about PT recs for SNF. Pt states he wants to go home with his wife and dtr, who he states can provide support.   CSW left VM for pts spouse to discuss dc plan.   1:01PM: CSW called pts home phone listed, spoke with dtr [name]. [name] states that her and her mother need to discuss recommendations from PT fro SNF. [name] states she does not think her father will mentally be okay with going to SNF because he is so use to her and her mother. CSW explained that after PT worked with him they recommended SNF for him to get more assistance once he DC from hospital. [name] states that her and her mom will need to discuss things over and will follow up with CSW tomorrow.     Expected Discharge Plan: Home w Home Health Services Barriers to Discharge: Continued Medical Work up  Expected Discharge Plan and Services In-house Referral: NA Discharge Planning Services: CM Consult Post Acute Care Choice: NA Living arrangements for the past 2 months: Single Family Home                 DME Arranged: N/A DME Agency: NA       HH Arranged: NA           Social Determinants of Health (SDOH) Interventions SDOH Screenings   Food Insecurity: No Food Insecurity (10/20/2023)  Housing: Patient Declined (10/20/2023)  Transportation Needs: No Transportation Needs (10/20/2023)  Utilities: Not At Risk (10/20/2023)  Tobacco Use: Low Risk  (10/19/2023)    Readmission Risk Interventions     No data to display

## 2023-10-23 NOTE — Care Management Important Message (Signed)
Important Message  Patient Details  Name: Jonathan Baird MRN: [id number] Date of Birth: 1932-12-16   Important Message Given:  Yes - Medicare IM     Dorena Bodo 10/23/2023, 2:49 PM

## 2023-10-23 NOTE — Plan of Care (Signed)

## 2023-10-24 DIAGNOSIS — J189 Pneumonia, unspecified organism: Secondary | ICD-10-CM | POA: Diagnosis not present

## 2023-10-24 DIAGNOSIS — N179 Acute kidney failure, unspecified: Secondary | ICD-10-CM | POA: Diagnosis not present

## 2023-10-24 LAB — BASIC METABOLIC PANEL
Anion gap: 7 (ref 5–15)
BUN: 39 mg/dL — ABNORMAL HIGH (ref 8–23)
CO2: 23 mmol/L (ref 22–32)
Calcium: 8.9 mg/dL (ref 8.9–10.3)
Chloride: 106 mmol/L (ref 98–111)
Creatinine, Ser: 2.83 mg/dL — ABNORMAL HIGH (ref 0.61–1.24)
GFR, Estimated: 21 mL/min — ABNORMAL LOW (ref >60)
Glucose, Bld: 138 mg/dL — ABNORMAL HIGH (ref 70–99)
Potassium: 4.8 mmol/L (ref 3.5–5.1)
Sodium: 136 mmol/L (ref 135–145)

## 2023-10-24 LAB — CBC
HCT: 25.6 % — ABNORMAL LOW (ref 39.0–52.0)
Hemoglobin: 8.5 g/dL — ABNORMAL LOW (ref 13.0–17.0)
MCH: 25.7 pg — ABNORMAL LOW (ref 26.0–34.0)
MCHC: 33.2 g/dL (ref 30.0–36.0)
MCV: 77.3 fL — ABNORMAL LOW (ref 80.0–100.0)
Platelets: 261 10*3/uL (ref 150–400)
RBC: 3.31 MIL/uL — ABNORMAL LOW (ref 4.22–5.81)
RDW: 14.6 % (ref 11.5–15.5)
WBC: 9 10*3/uL (ref 4.0–10.5)
nRBC: 0 % (ref 0.0–0.2)

## 2023-10-24 LAB — GLUCOSE, CAPILLARY
Glucose-Capillary: 131 mg/dL — ABNORMAL HIGH (ref 70–99)
Glucose-Capillary: 139 mg/dL — ABNORMAL HIGH (ref 70–99)
Glucose-Capillary: 166 mg/dL — ABNORMAL HIGH (ref 70–99)
Glucose-Capillary: 144 mg/dL — ABNORMAL HIGH (ref 70–99)

## 2023-10-24 NOTE — TOC Progression Note (Signed)
Transition of Care [facility]) - Progression Note    Patient Details  Name: Jonathan Baird MRN: [id number] Date of Birth: 07-15-1933  Transition of Care [facility]) CM/SW Contact  Michaela Corner, [place] Phone Number: 10/24/2023, 8:43 AM  Clinical Narrative:   CSW spoke with pts dtr, [name], about dc plan. Her and her mother have decided to take pts home and do HH at this time. CSW will update NCM and Attending.    Expected Discharge Plan: Home w Home Health Services Barriers to Discharge: Continued Medical Work up  Expected Discharge Plan and Services In-house Referral: NA Discharge Planning Services: CM Consult Post Acute Care Choice: NA Living arrangements for the past 2 months: Single Family Home                 DME Arranged: N/A DME Agency: NA       HH Arranged: NA           Social Determinants of Health (SDOH) Interventions SDOH Screenings   Food Insecurity: No Food Insecurity (10/20/2023)  Housing: Patient Declined (10/20/2023)  Transportation Needs: No Transportation Needs (10/20/2023)  Utilities: Not At Risk (10/20/2023)  Tobacco Use: Low Risk  (10/19/2023)    Readmission Risk Interventions     No data to display

## 2023-10-24 NOTE — Progress Notes (Signed)
Mobility Specialist Progress Note:   10/24/23 1112  Mobility  Activity Transferred from bed to chair  Level of Assistance Moderate assist, patient does 50-74%  Assistive Device Front wheel walker  Distance Ambulated (ft) 4 ft  Activity Response Tolerated well  Mobility Referral Yes  Mobility visit 1 Mobility  Mobility Specialist Start Time (ACUTE ONLY) [id number]  Mobility Specialist Stop Time (ACUTE ONLY) 0920  Mobility Specialist Time Calculation (min) (ACUTE ONLY) 15 min   Pt received in bed eager for mobility. Pt required MinA w/ bed mobility and ModA w/ STS. Was able to take a couple of steps w/o fault. No c/o throughout. Situated in chair w/ call bell and personal belongings in reach. All needs met w/ chair alarm on.   Thompson Grayer Mobility Specialist  Please contact vis Secure Chat or  Rehab Office [phone]

## 2023-10-24 NOTE — TOC Progression Note (Signed)
Transition of Care [facility]) - Progression Note    Patient Details  Name: Jonathan Baird MRN: [id number] Date of Birth: 1933/08/07  Transition of Care [facility]) CM/SW Contact  Leone Haven, RN Phone Number: 10/24/2023, 9:08 AM  Clinical Narrative:    NCM spoke with daughter she states they would like Rico, NCM made referral to [facility] with [facility], he is able to take referral for HHPT, HHOT.  Soc will begin 24 to 48 hrs post dc.    Expected Discharge Plan: Home w Home Health Services Barriers to Discharge: Continued Medical Work up  Expected Discharge Plan and Services In-house Referral: NA Discharge Planning Services: CM Consult Post Acute Care Choice: NA Living arrangements for the past 2 months: Single Family Home                 DME Arranged: N/A DME Agency: NA       HH Arranged: PT, OT HH Agency: [facility] Date [facility] Agency Contacted: 10/24/23 Time HH Agency Contacted: [phone] Representative spoke with at [facility] Agency: Kandee Keen   Social Determinants of Health (SDOH) Interventions SDOH Screenings   Food Insecurity: No Food Insecurity (10/20/2023)  Housing: Patient Declined (10/20/2023)  Transportation Needs: No Transportation Needs (10/20/2023)  Utilities: Not At Risk (10/20/2023)  Tobacco Use: Low Risk  (10/19/2023)    Readmission Risk Interventions     No data to display

## 2023-10-24 NOTE — Progress Notes (Signed)
PROGRESS NOTE    Jonathan Baird  FTD:[id number] DOB: 1933-04-15 DOA: 10/19/2023 PCP: Georgianne Fick, MD    Brief Narrative:  89 year old with history of dementia, HTN, HLD, DM2, CKD 4, PVD, right internal carotid stenosis on Xarelto, colon cancer status post hemicolectomy and chemotherapy in 2005 comes to the ED with complaints of syncope, cough and vomiting.  Chest x-ray shows concerns of possible bilateral pneumonia.  Started on Rocephin, azithromycin.  Hospital course complicated by AKI due to poor oral intake.  PT/OT recommends SNF but family prefers to go home   Assessment & Plan:  Principal Problem:   CAP (community acquired pneumonia) Active Problems:   Essential hypertension   Mixed hyperlipidemia   Syncope   Acute renal failure superimposed on stage 4 chronic kidney disease (HCC)   Acute encephalopathy   Elevated troponin   Type 2 diabetes mellitus (HCC)     Community-acquired pneumonia Urinary tract infection Chest x-ray showing bilateral pneumonia - COVID/RSV/flu are negative. - Urine cultures growing Streptococcus agalactiae -on IV abx- finish course   AKI on CKD stage IV Baseline creatinine 2.5, admission creatinine 3.17 which initially improved but due to lack of poor oral intake it started rising back again -improved on IVF -encourage PO intake   Vomiting and diarrhea No further episodes, resolved   Syncope, improved Unclear etiology.  Possible dehydration.  CT head negative.  Echocardiogram shows EF 65%, moderate LVH.  Carotid Doppler shows chronic stenosis right and left side but no significant flow compromise. Therapy evaluation-SNF but family prefers   Acute encephalopathy, significantly improved Possibly from dehydration underlying infection with probably underlying dementia.  TSH, CT head done-- ? NPH-- will need followed up outpatient .  There is still concerns of possible upper extremity weakness -MRI brain shows chronic  issues  Hypertension Urgency Holding home HCTZ/lisinopril due to AKI Temporarily will change hydralazine to 3 times daily, and increase to 100mg   Continue Norvasc 10 mg daily   Mild troponin elevation Demand ischemia, echo as above   Chronic microcytic anemia with iron deficiency -trend     Hyperlipidemia Continue Crestor.   Insulin-dependent type 2 diabetes A1c8.2.  He is not on long-acting insulin at home.  Placed on sensitive sliding scale insulin ACHS.   History of PVD and right internal carotid artery stenosis Resume home Xarelto -will need follow up of right carotid outpatient    DVT prophylaxis: SCDs Start: 10/19/23 2343 Code Status: Full Family Communication:   SNF placement?  Await TOC   Subjective: In chair, feeling better  Examination:   General: Appearance:    Elderly male in no acute distress     Lungs:    respirations unlabored  Heart:    Normal heart rate. Normal rhythm. No murmurs, rubs, or gallops.   MS:   All extremities are intact.   Neurologic:   Awake, alert         Objective: Vitals:   10/24/23 0052 10/24/23 0410 10/24/23 0735 10/24/23 1140  BP: (!) 149/65 99/75 (!) 140/93 (!) 120/103  Pulse: 60 70 72 66  Resp: 16 20 20 15   Temp: 98.8 F (37.1 C) 98.9 F (37.2 C) 97.8 F (36.6 C) 98 F (36.7 C)  TempSrc: Oral Oral Oral Oral  SpO2: 98% 100% 98% 100%  Weight:  73.2 kg    Height:        Intake/Output Summary (Last 24 hours) at 10/24/2023 1332 Last data filed at 10/24/2023 0623 Gross per 24 hour  Intake 1693.68 ml  Output 1400 ml  Net 293.68 ml   Filed Weights   10/22/23 0445 10/23/23 0419 10/24/23 0410  Weight: 71.3 kg 72.3 kg 73.2 kg    Scheduled Meds:  amLODipine  10 mg Oral Daily   aspirin  81 mg Oral Daily   ferrous sulfate  325 mg Oral Q breakfast   hydrALAZINE  100 mg Oral TID   insulin aspart  0-5 Units Subcutaneous QHS   insulin aspart  0-9 Units Subcutaneous TID WC   rivaroxaban  2.5 mg Oral BID    rosuvastatin  10 mg Oral Daily   senna-docusate  2 tablet Oral QHS   sodium bicarbonate  650 mg Oral Daily   Continuous Infusions:  cefTRIAXone (ROCEPHIN)  IV 2 g (10/23/23 2021)    Nutritional status     Body mass index is 22.51 kg/m.  Data Reviewed:   CBC: Recent Labs  Lab 10/19/23 1854 10/20/23 0125 10/21/23 0245 10/22/23 0232 10/23/23 0246 10/24/23 0228  WBC 16.6* 14.4* 8.4 8.7 8.4 9.0  NEUTROABS 14.1*  --   --   --   --   --   HGB 10.0* 8.6* 8.6* 7.9* 7.8* 8.5*  HCT 30.6* 26.2* 25.5* 23.7* 22.9* 25.6*  MCV 78.9* 77.7* 76.6* 76.2* 76.6* 77.3*  PLT 269 235 230 214 232 261   Basic Metabolic Panel: Recent Labs  Lab 10/19/23 1854 10/20/23 0125 10/21/23 0245 10/22/23 0232 10/23/23 0246 10/24/23 0228  NA 138 137 137 137 136 136  K 4.7 4.3 4.0 4.0 4.0 4.8  CL 104 107 105 105 108 106  CO2 24 22 24 23  21* 23  GLUCOSE 190* 161* 136* 154* 126* 138*  BUN 48* 45* 42* 47* 45* 39*  CREATININE 3.17* 2.88* 2.81* 3.17* 2.95* 2.83*  CALCIUM 9.5 8.9 8.9 8.8* 8.7* 8.9  MG 2.1  --   --   --  1.8  --   PHOS  --   --  2.9  --   --   --    GFR: Estimated Creatinine Clearance: 18.7 mL/min (A) (by C-G formula based on SCr of 2.83 mg/dL (H)). Liver Function Tests: Recent Labs  Lab 10/19/23 1854  AST 15  ALT 10  ALKPHOS 70  BILITOT 0.5  PROT 7.6  ALBUMIN 3.5   No results for input(s): "LIPASE", "AMYLASE" in the last 168 hours. No results for input(s): "AMMONIA" in the last 168 hours. Coagulation Profile: No results for input(s): "INR", "PROTIME" in the last 168 hours. Cardiac Enzymes: No results for input(s): "CKTOTAL", "CKMB", "CKMBINDEX", "TROPONINI" in the last 168 hours. BNP (last 3 results) No results for input(s): "PROBNP" in the last 8760 hours. HbA1C: No results for input(s): "HGBA1C" in the last 72 hours.  CBG: Recent Labs  Lab 10/23/23 1129 10/23/23 1515 10/23/23 2117 10/24/23 0612 10/24/23 1137  GLUCAP 155* 135* 135* 131* 139*   Lipid  Profile: No results for input(s): "CHOL", "HDL", "LDLCALC", "TRIG", "CHOLHDL", "LDLDIRECT" in the last 72 hours. Thyroid Function Tests: No results for input(s): "TSH", "T4TOTAL", "FREET4", "T3FREE", "THYROIDAB" in the last 72 hours.  Anemia Panel: No results for input(s): "VITAMINB12", "FOLATE", "FERRITIN", "TIBC", "IRON", "RETICCTPCT" in the last 72 hours.  Sepsis Labs: Recent Labs  Lab 10/19/23 1851 10/19/23 2102  LATICACIDVEN 2.3* 1.2    Recent Results (from the past 240 hours)  Resp panel by RT-PCR (RSV, Flu A&B, Covid) Anterior Nasal Swab     Status: None   Collection Time: 10/19/23  9:02 PM   Specimen: Anterior  Nasal Swab  Result Value Ref Range Status   SARS Coronavirus 2 by RT PCR NEGATIVE NEGATIVE Final   Influenza A by PCR NEGATIVE NEGATIVE Final   Influenza B by PCR NEGATIVE NEGATIVE Final    Comment: (NOTE) The Xpert Xpress SARS-CoV-2/FLU/RSV plus assay is intended as an aid in the diagnosis of influenza from Nasopharyngeal swab specimens and should not be used as a sole basis for treatment. Nasal washings and aspirates are unacceptable for Xpert Xpress SARS-CoV-2/FLU/RSV testing.  Fact Sheet for Patients: BloggerCourse.com  Fact Sheet for Healthcare Providers: SeriousBroker.it  This test is not yet approved or cleared by the [place] FDA and has been authorized for detection and/or diagnosis of SARS-CoV-2 by FDA under an Emergency Use Authorization (EUA). This EUA will remain in effect (meaning this test can be used) for the duration of the COVID-19 declaration under Section 564(b)(1) of the Act, 21 U.S.C. section 360bbb-3(b)(1), unless the authorization is terminated or revoked.     Resp Syncytial Virus by PCR NEGATIVE NEGATIVE Final    Comment: (NOTE) Fact Sheet for Patients: BloggerCourse.com  Fact Sheet for Healthcare  Providers: SeriousBroker.it  This test is not yet approved or cleared by the [place] FDA and has been authorized for detection and/or diagnosis of SARS-CoV-2 by FDA under an Emergency Use Authorization (EUA). This EUA will remain in effect (meaning this test can be used) for the duration of the COVID-19 declaration under Section 564(b)(1) of the Act, 21 U.S.C. section 360bbb-3(b)(1), unless the authorization is terminated or revoked.  Performed at [facility], [address] [place], [place] [address]   Culture, blood (Routine X 2) w Reflex to ID Panel     Status: None (Preliminary result)   Collection Time: 10/20/23  1:23 AM   Specimen: BLOOD LEFT ARM  Result Value Ref Range Status   Specimen Description BLOOD LEFT ARM  Final   Special Requests   Final    BOTTLES DRAWN AEROBIC AND ANAEROBIC Blood Culture results may not be optimal due to an inadequate volume of blood received in culture bottles   Culture   Final    NO GROWTH 4 DAYS Performed at [facility], [address] [place], [place] [address]    Report Status PENDING  Incomplete  Culture, blood (Routine X 2) w Reflex to ID Panel     Status: None (Preliminary result)   Collection Time: 10/20/23  1:23 AM   Specimen: BLOOD RIGHT HAND  Result Value Ref Range Status   Specimen Description BLOOD RIGHT HAND  Final   Special Requests   Final    BOTTLES DRAWN AEROBIC AND ANAEROBIC Blood Culture adequate volume   Culture   Final    NO GROWTH 4 DAYS Performed at [facility], [address], [place], [place] [address]    Report Status PENDING  Incomplete  Urine Culture     Status: Abnormal   Collection Time: 10/20/23  2:08 AM   Specimen: Urine, Random  Result Value Ref Range Status   Specimen Description URINE, RANDOM  Final   Special Requests NONE Reflexed from [id number]  Final   Culture (A)  Final    >=100,000 COLONIES/mL STREPTOCOCCUS AGALACTIAE TESTING AGAINST S.  AGALACTIAE NOT ROUTINELY PERFORMED DUE TO PREDICTABILITY OF AMP/PEN/VAN SUSCEPTIBILITY. Performed at [facility], [address], [place], [place] [address]    Report Status 10/21/2023 FINAL  Final         Radiology Studies: MR  BRAIN WO CONTRAST Result Date: 10/22/2023 CLINICAL DATA:  Transient ischemic attack. EXAM: MRI HEAD WITHOUT CONTRAST TECHNIQUE: Multiplanar, multiecho pulse sequences of the brain and surrounding structures were obtained without intravenous contrast. COMPARISON:  Head CT October 20, 2023. FINDINGS: Brain: No acute infarction, hemorrhage, extra-axial collection or mass lesion. Small focus of hyperintensity in the right frontal lobe on the axial DWI has no correlate on the coronal DWI or ADC maps. 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 Scattered and confluent foci of T2 hyperintensity are seen within the white matter the cerebral hemispheres and within the pons, nonspecific. Prominence of the supratentorial ventricles and sylvian fissures with relative preservation the high convexity sulci and decreased callosal angle, unchanged. Vascular: Normal flow voids. Skull and upper cervical spine: Normal marrow signal. Sinuses/Orbits: Negative. Other: None. IMPRESSION: 1. No acute intracranial abnormality. 2. 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 3. Moderate chronic microvascular ischemic changes of the white matter. 4. Prominence of the supratentorial ventricles and Sylvian fissures with relative preservation of the high convexity sulci and decreased callosal angle, unchanged. Findings may be seen in the setting of normal pressure hydrocephalus. Electronically Signed   By: Baldemar Lenis M.D.   On: 10/22/2023 15:21           LOS: 4 days   Time spent= 35 mins    Joseph Art, DO [facility]  If 7PM-7AM, please contact night-coverage  10/24/2023, 1:32 PM

## 2023-10-24 NOTE — Progress Notes (Signed)
Physical Therapy Treatment Patient Details Name: Jonathan Baird MRN: [id number] DOB: 05-16-1933 Today's Date: 10/24/2023   History of Present Illness Pt is an 89 y/o M presenting to ED on 12/7 with syncope, cough and vomiting, CXR with conerns for possible bil PNA. Admitted for CAP.    PMH includes mild dementia, HTN, HLD, insulin dependent DM2, CKD IV, PVD and R internal carotid artery stenosis on xarelto, colon CA s/p hemicolectomy and chemo in 2005    PT Comments  Patient resting in recliner leaning Lt against pillow proper on bed. Pt able to sit up midline when cued. Pt eager to mobilize and work with therapy. Cues for technique with sit<>stand and mod assist to rise and prevent posterior LOB. Pt sat urgently and voiced urge to urinate and being embarrassed. Educated pt on normal sensation of gravity on the bladder and that if he has an urge on standing to voice that and therapist will turn to give accommodate some modesty; pt stood again and successfully voiding and reported feeling better. Close chair follow provided for gait and pt required mod-max assist to prevent Lt LOB and multimodal cues to facilitate more normalized step width. Pt fatigued after ~15' and returned to sitting. EOS pt remained in recliner, Alarm on and call bell within reach. Will continue to progress pt as able during stay.    If plan is discharge home, recommend the following: A lot of help with walking and/or transfers;A lot of help with bathing/dressing/bathroom;Assistance with cooking/housework;Direct supervision/assist for medications management;Assist for transportation;Help with stairs or ramp for entrance   Can travel by private vehicle     No  Equipment Recommendations  None recommended by PT    Recommendations for Other Services       Precautions / Restrictions Precautions Precautions: Fall Precaution Comments: watch BP Restrictions Weight Bearing Restrictions Per Provider Order: No      Mobility  Bed Mobility               General bed mobility comments: pt OOB in recliner    Transfers Overall transfer level: Needs assistance Equipment used: Rolling walker (2 wheels) Transfers: Sit to/from Stand Sit to Stand: Mod assist           General transfer comment: Cues for flexing knees and foot placement, cues for hand placement on armrest to power up, mod assist to complete rise and facilitate anterior weight shift as pt leaning posteriorly.    Ambulation/Gait Ambulation/Gait assistance: Mod assist, Max assist, +2 safety/equipment Gait Distance (Feet): 15 Feet Assistive device: Rolling walker (2 wheels) Gait Pattern/deviations: Step-to pattern, Decreased step length - left, Decreased step length - right, Decreased stride length, Narrow base of support, Trunk flexed, Scissoring (Left Leaning; Lt LE adducting) Gait velocity: decr     General Gait Details: mod-max assist to stabilize balance. Visual cues for Lt step placement as pt tends to adduct past midline. visal cues improved step width. pt leaning Lt and support provided to maintain midline posture. Close chair follow for safety. pt fatigues quickly.   Stairs             Wheelchair Mobility     Tilt Bed    Modified Rankin (Stroke Patients Only)       Balance Overall balance assessment: Needs assistance Sitting-balance support: Feet supported Sitting balance-Leahy Scale: Fair     Standing balance support: During functional activity, Reliant on assistive device for balance Standing balance-Leahy Scale: Poor Standing balance comment: heavily reliant on external support  Cognition Arousal: Alert Behavior During Therapy: WFL for tasks assessed/performed Overall Cognitive Status: No family/caregiver present to determine baseline cognitive functioning Area of Impairment: Following commands, Attention, Awareness, Problem solving, Safety/judgement                    Current Attention Level: Selective   Following Commands: Follows one step commands consistently, Follows multi-step commands with increased time Safety/Judgement: Decreased awareness of deficits Awareness: Emergent Problem Solving: Slow processing, Decreased initiation, Difficulty sequencing, Requires verbal cues          Exercises      General Comments        Pertinent Vitals/Pain Pain Assessment Pain Assessment: No/denies pain    Home Living                          Prior Function            PT Goals (current goals can now be found in the care plan section) Acute Rehab PT Goals Patient Stated Goal: get better PT Goal Formulation: With patient Time For Goal Achievement: 11/05/23 Potential to Achieve Goals: Good Progress towards PT goals: Progressing toward goals    Frequency    Min 1X/week      PT Plan      Co-evaluation              AM-PAC PT "6 Clicks" Mobility   Outcome Measure  Help needed turning from your back to your side while in a flat bed without using bedrails?: A Little Help needed moving from lying on your back to sitting on the side of a flat bed without using bedrails?: A Lot Help needed moving to and from a bed to a chair (including a wheelchair)?: A Lot Help needed standing up from a chair using your arms (e.g., wheelchair or bedside chair)?: A Lot Help needed to walk in hospital room?: A Lot Help needed climbing 3-5 steps with a railing? : Total 6 Click Score: 12    End of Session Equipment Utilized During Treatment: Gait belt Activity Tolerance: Patient tolerated treatment well Patient left: in chair;with call bell/phone within reach;with chair alarm set;with family/visitor present Nurse Communication: Mobility status;Other (comment) PT Visit Diagnosis: Unsteadiness on feet (R26.81);Other abnormalities of gait and mobility (R26.89);Difficulty in walking, not elsewhere classified (R26.2);Other  symptoms and signs involving the nervous system (R29.898)     Time: 4696-2952 PT Time Calculation (min) (ACUTE ONLY): 22 min  Charges:    $Gait Training: 8-22 mins PT General Charges $$ ACUTE PT VISIT: 1 Visit                     Wynn Maudlin, DPT Acute Rehabilitation Services Office [phone]  10/24/23 2:50 PM

## 2023-10-24 NOTE — Plan of Care (Signed)
Care plan reviewed.

## 2023-10-25 ENCOUNTER — Other Ambulatory Visit (HOSPITAL_COMMUNITY): Payer: Self-pay

## 2023-10-25 DIAGNOSIS — N179 Acute kidney failure, unspecified: Secondary | ICD-10-CM | POA: Diagnosis not present

## 2023-10-25 DIAGNOSIS — R55 Syncope and collapse: Secondary | ICD-10-CM | POA: Diagnosis not present

## 2023-10-25 DIAGNOSIS — J189 Pneumonia, unspecified organism: Secondary | ICD-10-CM | POA: Diagnosis not present

## 2023-10-25 LAB — BASIC METABOLIC PANEL
Anion gap: 8 (ref 5–15)
BUN: 43 mg/dL — ABNORMAL HIGH (ref 8–23)
CO2: 22 mmol/L (ref 22–32)
Calcium: 8.6 mg/dL — ABNORMAL LOW (ref 8.9–10.3)
Chloride: 104 mmol/L (ref 98–111)
Creatinine, Ser: 2.79 mg/dL — ABNORMAL HIGH (ref 0.61–1.24)
GFR, Estimated: 21 mL/min — ABNORMAL LOW (ref >60)
Glucose, Bld: 151 mg/dL — ABNORMAL HIGH (ref 70–99)
Potassium: 4 mmol/L (ref 3.5–5.1)
Sodium: 134 mmol/L — ABNORMAL LOW (ref 135–145)

## 2023-10-25 LAB — CULTURE, BLOOD (ROUTINE X 2)
Culture: NO GROWTH
Culture: NO GROWTH
Special Requests: ADEQUATE

## 2023-10-25 LAB — GLUCOSE, CAPILLARY
Glucose-Capillary: 138 mg/dL — ABNORMAL HIGH (ref 70–99)
Glucose-Capillary: 147 mg/dL — ABNORMAL HIGH (ref 70–99)

## 2023-10-25 LAB — CBC
HCT: 24.1 % — ABNORMAL LOW (ref 39.0–52.0)
Hemoglobin: 8.2 g/dL — ABNORMAL LOW (ref 13.0–17.0)
MCH: 25.9 pg — ABNORMAL LOW (ref 26.0–34.0)
MCHC: 34 g/dL (ref 30.0–36.0)
MCV: 76 fL — ABNORMAL LOW (ref 80.0–100.0)
Platelets: 259 10*3/uL (ref 150–400)
RBC: 3.17 MIL/uL — ABNORMAL LOW (ref 4.22–5.81)
RDW: 14.6 % (ref 11.5–15.5)
WBC: 8.7 10*3/uL (ref 4.0–10.5)
nRBC: 0 % (ref 0.0–0.2)

## 2023-10-25 MED ORDER — FERROUS SULFATE 325 (65 FE) MG PO TABS: 325.0000 mg | ORAL_TABLET | Freq: Every day | ORAL | Status: DC

## 2023-10-25 MED ORDER — FERROUS SULFATE 325 (65 FE) MG PO TABS
325.0000 mg | ORAL_TABLET | Freq: Every day | ORAL | 1 refills | Status: DC
  Filled 2023-10-25: qty 30, 30d supply, fill #0

## 2023-10-25 NOTE — TOC Progression Note (Signed)
Transition of Care [facility]) - Progression Note    Patient Details  Name: Jonathan Baird MRN: [id number] Date of Birth: 1933-01-31  [facility] CM/SW Contact  Leone Haven, RN Phone Number: 10/25/2023, 10:09 AM  Clinical Narrative:    HHaide and SW was added to [facility] services, confirmed with [place] with [place].    Expected Discharge Plan: Home w Home Health Services Barriers to Discharge: Continued Medical Work up  Expected Discharge Plan and Services In-house Referral: NA Discharge Planning Services: CM Consult Post Acute Care Choice: NA Living arrangements for the past 2 months: Single Family Home Expected Discharge Date: 10/25/23               DME Arranged: N/A DME Agency: NA       HH Arranged: PT, OT HH Agency: [facility] Date [facility] Agency Contacted: 10/24/23 Time HH Agency Contacted: [phone] Representative spoke with at [facility] Agency: Kandee Keen   Social Determinants of Health (SDOH) Interventions SDOH Screenings   Food Insecurity: No Food Insecurity (10/20/2023)  Housing: Patient Declined (10/20/2023)  Transportation Needs: No Transportation Needs (10/20/2023)  Utilities: Not At Risk (10/20/2023)  Tobacco Use: Low Risk  (10/19/2023)    Readmission Risk Interventions     No data to display

## 2023-10-25 NOTE — TOC Transition Note (Addendum)
Transition of Care [facility]) - Discharge Note   Patient Details  Name: Jonathan Baird MRN: [id number] Date of Birth: November 28, 1932  Transition of Care [facility]) CM/SW Contact:  Leone Haven, RN Phone Number: 10/25/2023, 10:10 AM   Clinical Narrative:    For dc today, NCM notified Cory with [facility], he may need ambulance transport.  NCM will schedule ptar. Ptar scheduled.   Final next level of care: Home w Home Health Services Barriers to Discharge: Continued Medical Work up   Patient Goals and CMS Choice Patient states their goals for this hospitalization and ongoing recovery are:: return home   Choice offered to / list presented to : NA      Discharge Placement                       Discharge Plan and Services Additional resources added to the After Visit Summary for   In-house Referral: NA Discharge Planning Services: CM Consult Post Acute Care Choice: NA          DME Arranged: N/A DME Agency: NA       HH Arranged: PT, OT HH Agency: [facility] Date [facility] Agency Contacted: 10/24/23 Time HH Agency Contacted: [phone] Representative spoke with at [facility] Agency: Kandee Keen  Social Drivers of Health (SDOH) Interventions SDOH Screenings   Food Insecurity: No Food Insecurity (10/20/2023)  Housing: Patient Declined (10/20/2023)  Transportation Needs: No Transportation Needs (10/20/2023)  Utilities: Not At Risk (10/20/2023)  Tobacco Use: Low Risk  (10/19/2023)     Readmission Risk Interventions     No data to display

## 2023-10-25 NOTE — Progress Notes (Signed)
Mobility Specialist Progress Note:    10/25/23 1106  Mobility  Activity Transferred from bed to chair  Level of Assistance Moderate assist, patient does 50-74%  Assistive Device Front wheel walker  Distance Ambulated (ft) 4 ft  Activity Response Tolerated well  Mobility Referral Yes  Mobility visit 1 Mobility  Mobility Specialist Start Time (ACUTE ONLY) 0931  Mobility Specialist Stop Time (ACUTE ONLY) 0945  Mobility Specialist Time Calculation (min) (ACUTE ONLY) 14 min   Pt received in bed agreeable to mobility. Pt was able to perform bed mobility w/ minA, for STS ModA d/t posterior lean. Was able to take a couple shuffling steps towards the chair w/o fault. Needed assistance changing gown. Call bell and personal belongings in reach. All needs met chair alarm. NT in room.  Thompson Grayer Mobility Specialist  Please contact vis Secure Chat or  Rehab Office [phone]

## 2023-10-25 NOTE — Progress Notes (Signed)
Pt has orders to be discharged. Discharge instructions given and pt has no additional questions at this time. Medication regimen reviewed and pt educated. Pt verbalized understanding and has no additional questions. Telemetry box removed. IV removed and site in good condition. Pt stable and waiting for transportation. Wife states she will transport him home and does not need PTAR.

## 2023-10-25 NOTE — Plan of Care (Signed)
Skin shows no obvious signs of breakdown

## 2023-10-25 NOTE — Discharge Summary (Signed)
Physician Discharge Summary  [name] NWG:[id number] DOB: 1933/09/18 DOA: 10/19/2023  PCP: Georgianne Fick, MD  Admit date: 10/19/2023 Discharge date: 10/25/2023  Admitted From: home Discharge disposition: home   Recommendations for Outpatient Follow-Up:   Home health -will need follow up of right carotid outpatient  Adjust BP meds Cbc/bmp 1 week   Discharge Diagnosis:   Principal Problem:   CAP (community acquired pneumonia) Active Problems:   Essential hypertension   Mixed hyperlipidemia   Syncope   Acute renal failure superimposed on stage 4 chronic kidney disease (HCC)   Acute encephalopathy   Elevated troponin   Type 2 diabetes mellitus (HCC)    Discharge Condition: Improved.  Diet recommendation: regular  Wound care: None.  Code status: Full.   History of Present Illness:   Jonathan Baird is a 89 y.o. male with medical history significant of mild dementia, hypertension, hyperlipidemia, insulin-dependent type 2 diabetes, CKD stage IV, PVD and right internal carotid artery stenosis on Xarelto, history of colon cancer status post hemicolectomy and chemotherapy in 2005 presented to the ED with complaints of syncope, cough, and vomiting.  Vital signs on arrival: Temperature 98.4 F, pulse 88, respiratory rate 18, blood pressure 170/72, and SpO2 98% on room air.  Labs notable for WBC count 16.6, hemoglobin 10.0, MCV 78.9, glucose 190, BUN 48, creatinine 3.1, initial lactic acid 2.3 and repeat pending, UA pending, magnesium 2.1, TSH normal, initial troponin negative and repeat pending, COVID/influenza/RSV PCR pending.  Chest x-ray showing bilateral lower lobe airspace opacities, left greater than right concerning for pneumonia. Patient was given ceftriaxone, azithromycin, and 1.5 L normal saline.  TRH called to admit.   Patient is AAOx3 but appears slightly confused.  He knows the ambulance brought him here but is not sure why.  He does report passing  out at home but is not able to give any additional details.  He reports 1 episode of vomiting and diarrhea at home earlier today but symptoms seem to have resolved.  He denies any abdominal pain.  He is reporting chills and poor appetite.  Denies cough, shortness of breath, or chest pain.  States he stays at home with his wife and daughter.     Hospital Course by Problem:   Community-acquired pneumonia Urinary tract infection Chest x-ray showing bilateral pneumonia - COVID/RSV/flu are negative. - Urine cultures growing Streptococcus agalactiae -finish course of IV abx   AKI on CKD stage IV Baseline creatinine 2.5, admission creatinine 3.17 which initially improved but due to lack of poor oral intake it started rising back again -improved on IVF -encourage PO intake    Vomiting and diarrhea No further episodes, resolved   Syncope, improved Unclear etiology.  Possible dehydration.  CT head negative.  Echocardiogram shows EF 65%, moderate LVH.  Carotid Doppler shows chronic stenosis right and left side but no significant flow compromise. Therapy evaluation-SNF but family prefers to go home with h./h   Acute encephalopathy, significantly improved Possibly from dehydration underlying infection with probably underlying dementia.  TSH, CT head done-- ? NPH-- will need followed up outpatient .  There is still concerns of possible upper extremity weakness -MRI brain shows chronic issues   Hypertension Urgency Holding home HCTZ    Mild troponin elevation Demand ischemia, echo as above   Chronic microcytic anemia with iron deficiency -trend      Hyperlipidemia Continue Crestor.   Insulin-dependent type 2 diabetes A1c8.2.   History of PVD and right internal carotid  artery stenosis Resume home Xarelto -will need follow up of right carotid outpatient       Medical Consultants:      Discharge Exam:   Vitals:   10/25/23 0445 10/25/23 0720  BP: (!) 158/73 (!) 153/69   Pulse: 62 65  Resp: 12 18  Temp: 98.7 F (37.1 C) 98 F (36.7 C)  SpO2: 100% 99%   Vitals:   10/24/23 1920 10/25/23 0004 10/25/23 0445 10/25/23 0720  BP: (!) 142/56 (!) 150/61 (!) 158/73 (!) 153/69  Pulse: 66 65 62 65  Resp: 20 17 12 18   Temp: 99 F (37.2 C) 99.2 F (37.3 C) 98.7 F (37.1 C) 98 F (36.7 C)  TempSrc: Oral Oral Oral Oral  SpO2: 97% 100% 100% 99%  Weight:   72.2 kg   Height:        General exam: Appears calm and comfortable.   The results of significant diagnostics from this hospitalization (including imaging, microbiology, ancillary and laboratory) are listed below for reference.     Procedures and Diagnostic Studies:   VAS US CAROTID Result Date: 10/21/2023 Carotid Arterial Duplex Study Patient Name:  Jonathan Baird  Date of Exam:   10/20/2023 Medical Rec #: [id number]        Accession #:    [id number] Date of Birth: 07-20-1933       Patient Gender: M Patient Age:   6 years Exam Location:  [facility] Procedure:      VAS US CAROTID Referring Phys: Ulyess Blossom RATHORE --------------------------------------------------------------------------------  Indications:       Syncope. Risk Factors:      Hypertension, hyperlipidemia, Diabetes. Other Factors:     CKD IV. Limitations        Today's exam was limited due to constant movement of head,                    the patient's inability or unwillingness to cooperate and                    heavy calcification and the resulting shadowing. Comparison Study:  Prior carotid duplex done 11/02/21 indicating 40-59% right                    ICA stenosis and 1-39% left ICA stenosis. Performing Technologist: Sherren Kerns RVS  Examination Guidelines: A complete evaluation includes B-mode imaging, spectral Doppler, color Doppler, and power Doppler as needed of all accessible portions of each vessel. Bilateral testing is considered an integral part of a complete examination. Limited examinations for reoccurring indications may be  performed as noted.  Right Carotid Findings: +----------+--------+--------+--------+------------------+---------+           PSV cm/sEDV cm/sStenosisPlaque DescriptionComments  +----------+--------+--------+--------+------------------+---------+ CCA Prox  87      10              homogeneous                 +----------+--------+--------+--------+------------------+---------+ CCA Distal121     12              homogeneous                 +----------+--------+--------+--------+------------------+---------+ ICA Prox  351     37      40-59%  calcific          Shadowing +----------+--------+--------+--------+------------------+---------+ ICA Mid   286     23  tortuous  +----------+--------+--------+--------+------------------+---------+ ICA Distal81      15                                tortuous  +----------+--------+--------+--------+------------------+---------+ ECA       192     28                                          +----------+--------+--------+--------+------------------+---------+ +----------+--------+-------+--------+-------------------+           PSV cm/sEDV cmsDescribeArm Pressure (mmHG) +----------+--------+-------+--------+-------------------+ [id number]                                         +----------+--------+-------+--------+-------------------+ +---------+--------+--+--------+--+ VertebralPSV cm/s85EDV cm/s10 +---------+--------+--+--------+--+  Left Carotid Findings: +----------+--------+--------+--------+------------------+--------+           PSV cm/sEDV cm/sStenosisPlaque DescriptionComments +----------+--------+--------+--------+------------------+--------+ CCA Prox  140     17              heterogenous               +----------+--------+--------+--------+------------------+--------+ CCA Distal150     13              heterogenous                +----------+--------+--------+--------+------------------+--------+ ICA Prox  159     13      1-39%                              +----------+--------+--------+--------+------------------+--------+ ICA Mid   112     20                                tortuous +----------+--------+--------+--------+------------------+--------+ ICA Distal111     16                                tortuous +----------+--------+--------+--------+------------------+--------+ ECA       147     0                                          +----------+--------+--------+--------+------------------+--------+ +----------+--------+--------+--------+-------------------+           PSV cm/sEDV cm/sDescribeArm Pressure (mmHG) +----------+--------+--------+--------+-------------------+ Subclavian210                                         +----------+--------+--------+--------+-------------------+ +---------+--------+--+--------+--+ VertebralPSV cm/s66EDV cm/s11 +---------+--------+--+--------+--+   Summary: Right Carotid: Velocities in the right ICA are consistent with a 40-59%                stenosis. Higher velocities may be obscured by calcification. Left Carotid: Velocities in the left ICA are consistent with a 1-39% stenosis. Vertebrals:  Bilateral vertebral arteries demonstrate antegrade flow. Subclavians: Normal flow hemodynamics were seen in bilateral subclavian              arteries. *See table(s) above for measurements and observations.  Electronically signed by Faylene Million  Brabham MD on 10/21/2023 at 12:19:05 PM.    Final    ECHOCARDIOGRAM COMPLETE Result Date: 10/20/2023    ECHOCARDIOGRAM REPORT   Patient Name:   Jonathan Baird Date of Exam: 10/20/2023 Medical Rec #:  [id number]       Height:       71.0 in Accession #:    [id number]      Weight:       168.0 lb Date of Birth:  1933-07-13      BSA:          1.958 m Patient Age:    89 years        BP:           158/81 mmHg Patient Gender: M                HR:           72 bpm. Exam Location:  Inpatient Procedure: 2D Echo, Color Doppler and Cardiac Doppler Indications:    Syncope  History:        Patient has no prior history of Echocardiogram examinations.                 Elevated Troponin, CKD and Carotid Disease,                 Signs/Symptoms:Syncope; Risk Factors:Diabetes, Dyslipidemia and                 Hypertension.  Sonographer:    Milbert Coulter Referring Phys: [id number] VASUNDHRA RATHORE IMPRESSIONS  1. Left ventricular ejection fraction, by estimation, is 60 to 65%. The left ventricle has normal function. The left ventricle has no regional wall motion abnormalities. There is moderate left ventricular hypertrophy. Left ventricular diastolic parameters are consistent with Grade I diastolic dysfunction (impaired relaxation).  2. Right ventricular systolic function is normal. The right ventricular size is normal.  3. Left atrial size was mildly dilated.  4. The mitral valve is abnormal. No evidence of mitral valve regurgitation. No evidence of mitral stenosis. Moderate mitral annular calcification.  5. The aortic valve is tricuspid. There is moderate calcification of the aortic valve. There is moderate thickening of the aortic valve. Aortic valve regurgitation is not visualized. Aortic valve sclerosis is present, with no evidence of aortic valve stenosis.  6. The inferior vena cava is dilated in size with >50% respiratory variability, suggesting right atrial pressure of 8 mmHg. FINDINGS  Left Ventricle: Left ventricular ejection fraction, by estimation, is 60 to 65%. The left ventricle has normal function. The left ventricle has no regional wall motion abnormalities. The left ventricular internal cavity size was normal in size. There is  moderate left ventricular hypertrophy. Left ventricular diastolic parameters are consistent with Grade I diastolic dysfunction (impaired relaxation). Right Ventricle: The right ventricular size is normal. No increase in right  ventricular wall thickness. Right ventricular systolic function is normal. Left Atrium: Left atrial size was mildly dilated. Right Atrium: Right atrial size was normal in size. Pericardium: There is no evidence of pericardial effusion. Mitral Valve: The mitral valve is abnormal. There is mild thickening of the mitral valve leaflet(s). There is mild calcification of the mitral valve leaflet(s). Moderate mitral annular calcification. No evidence of mitral valve regurgitation. No evidence  of mitral valve stenosis. Tricuspid Valve: The tricuspid valve is normal in structure. Tricuspid valve regurgitation is not demonstrated. No evidence of tricuspid stenosis. Aortic Valve: The aortic valve is tricuspid. There is moderate calcification of the aortic valve. There  is moderate thickening of the aortic valve. Aortic valve regurgitation is not visualized. Aortic valve sclerosis is present, with no evidence of aortic valve stenosis. Aortic valve mean gradient measures 4.0 mmHg. Aortic valve peak gradient measures 6.8 mmHg. Aortic valve area, by VTI measures 3.18 cm. Pulmonic Valve: The pulmonic valve was normal in structure. Pulmonic valve regurgitation is not visualized. No evidence of pulmonic stenosis. Aorta: The aortic root is normal in size and structure. Venous: The inferior vena cava is dilated in size with greater than 50% respiratory variability, suggesting right atrial pressure of 8 mmHg. IAS/Shunts: No atrial level shunt detected by color flow Doppler.  LEFT VENTRICLE PLAX 2D LVIDd:         4.30 cm   Diastology LVIDs:         2.90 cm   LV e' medial:    7.62 cm/s LV PW:         1.50 cm   LV E/e' medial:  9.8 LV IVS:        1.50 cm   LV e' lateral:   11.20 cm/s LVOT diam:     2.00 cm   LV E/e' lateral: 6.7 LV SV:         96 LV SV Index:   49 LVOT Area:     3.14 cm  RIGHT VENTRICLE RV Basal diam:  3.40 cm RV S prime:     11.30 cm/s TAPSE (M-mode): 2.2 cm LEFT ATRIUM             Index        RIGHT ATRIUM            Index LA diam:        4.00 cm 2.04 cm/m   RA Area:     11.40 cm LA Vol (A2C):   69.2 ml 35.34 ml/m  RA Volume:   26.20 ml  13.38 ml/m LA Vol (A4C):   28.2 ml 14.40 ml/m LA Biplane Vol: 48.8 ml 24.92 ml/m  AORTIC VALVE AV Area (Vmax):    2.95 cm AV Area (Vmean):   3.19 cm AV Area (VTI):     3.18 cm AV Vmax:           130.00 cm/s AV Vmean:          88.200 cm/s AV VTI:            0.303 m AV Peak Grad:      6.8 mmHg AV Mean Grad:      4.0 mmHg LVOT Vmax:         122.00 cm/s LVOT Vmean:        89.600 cm/s LVOT VTI:          0.307 m LVOT/AV VTI ratio: 1.01  AORTA Ao Root diam: 3.40 cm Ao Asc diam:  3.40 cm MITRAL VALVE MV Area (PHT): 2.63 cm     SHUNTS MV Decel Time: 288 msec     Systemic VTI:  0.31 m MV E velocity: 74.90 cm/s   Systemic Diam: 2.00 cm MV A velocity: 117.00 cm/s MV E/A ratio:  0.64 Charlton Haws MD Electronically signed by Charlton Haws MD Signature Date/Time: 10/20/2023/10:23:12 AM    Final    CT HEAD WO CONTRAST ( ) Result Date: 10/20/2023 CLINICAL DATA:  Recent syncopal episode EXAM: CT HEAD WITHOUT CONTRAST TECHNIQUE: Contiguous axial images were obtained from the base of the skull through the vertex without intravenous contrast. RADIATION DOSE REDUCTION: This exam was performed according to the departmental  dose-optimization program which includes automated exposure control, adjustment of the mA and/or kV according to patient size and/or use of iterative reconstruction technique. COMPARISON:  04/26/2022 FINDINGS: Brain: No evidence of acute infarction, hemorrhage, hydrocephalus, extra-axial collection or mass lesion/mass effect. Chronic atrophic and ischemic changes are noted with evidence of ventricular dilatation. Vascular: No hyperdense vessel or unexpected calcification. Skull: Normal. Negative for fracture or focal lesion. Sinuses/Orbits: No acute finding. Other: None. IMPRESSION: Chronic atrophic and ischemic changes similar to that seen on prior exam. Stable ventricular dilatation  Electronically Signed   By: Alcide Clever M.D.   On: 10/20/2023 00:22   DG Chest 2 View Result Date: 10/19/2023 CLINICAL DATA:  Syncope, fatigue EXAM: CHEST - 2 VIEW COMPARISON:  04/26/2022 FINDINGS: Patchy airspace disease in the lower lobes, left greater than right concerning for pneumonia. No effusions. Heart and mediastinal contours within normal limits. No acute bony abnormality. IMPRESSION: Bilateral lower lobe airspace opacities, left greater than right concerning for pneumonia. Electronically Signed   By: Charlett Nose M.D.   On: 10/19/2023 19:48     Labs:   Basic Metabolic Panel: Recent Labs  Lab 10/19/23 1854 10/20/23 0125 10/21/23 0245 10/22/23 0232 10/23/23 0246 10/24/23 0228 10/25/23 0234  NA 138   < > 137 137 136 136 134*  K 4.7   < > 4.0 4.0 4.0 4.8 4.0  CL 104   < > 105 105 108 106 104  CO2 24   < > 24 23 21* 23 22  GLUCOSE 190*   < > 136* 154* 126* 138* 151*  BUN 48*   < > 42* 47* 45* 39* 43*  CREATININE 3.17*   < > 2.81* 3.17* 2.95* 2.83* 2.79*  CALCIUM 9.5   < > 8.9 8.8* 8.7* 8.9 8.6*  MG 2.1  --   --   --  1.8  --   --   PHOS  --   --  2.9  --   --   --   --    < > = values in this interval not displayed.   GFR Estimated Creatinine Clearance: 18.7 mL/min (A) (by C-G formula based on SCr of 2.79 mg/dL (H)). Liver Function Tests: Recent Labs  Lab 10/19/23 1854  AST 15  ALT 10  ALKPHOS 70  BILITOT 0.5  PROT 7.6  ALBUMIN 3.5   No results for input(s): "LIPASE", "AMYLASE" in the last 168 hours. No results for input(s): "AMMONIA" in the last 168 hours. Coagulation profile No results for input(s): "INR", "PROTIME" in the last 168 hours.  CBC: Recent Labs  Lab 10/19/23 1854 10/20/23 0125 10/21/23 0245 10/22/23 0232 10/23/23 0246 10/24/23 0228 10/25/23 0234  WBC 16.6*   < > 8.4 8.7 8.4 9.0 8.7  NEUTROABS 14.1*  --   --   --   --   --   --   HGB 10.0*   < > 8.6* 7.9* 7.8* 8.5* 8.2*  HCT 30.6*   < > 25.5* 23.7* 22.9* 25.6* 24.1*  MCV 78.9*   < >  76.6* 76.2* 76.6* 77.3* 76.0*  PLT 269   < > 230 214 232 261 259   < > = values in this interval not displayed.   Cardiac Enzymes: No results for input(s): "CKTOTAL", "CKMB", "CKMBINDEX", "TROPONINI" in the last 168 hours. BNP: Invalid input(s): "POCBNP" CBG: Recent Labs  Lab 10/24/23 0612 10/24/23 1137 10/24/23 1531 10/24/23 2058 10/25/23 0552  GLUCAP 131* 139* 166* 144* 138*   D-Dimer No results  for input(s): "DDIMER" in the last 72 hours. Hgb A1c No results for input(s): "HGBA1C" in the last 72 hours. Lipid Profile No results for input(s): "CHOL", "HDL", "LDLCALC", "TRIG", "CHOLHDL", "LDLDIRECT" in the last 72 hours. Thyroid function studies No results for input(s): "TSH", "T4TOTAL", "T3FREE", "THYROIDAB" in the last 72 hours.  Invalid input(s): "FREET3" Anemia work up No results for input(s): "VITAMINB12", "FOLATE", "FERRITIN", "TIBC", "IRON", "RETICCTPCT" in the last 72 hours. Microbiology Recent Results (from the past 240 hours)  Resp panel by RT-PCR (RSV, Flu A&B, Covid) Anterior Nasal Swab     Status: None   Collection Time: 10/19/23  9:02 PM   Specimen: Anterior Nasal Swab  Result Value Ref Range Status   SARS Coronavirus 2 by RT PCR NEGATIVE NEGATIVE Final   Influenza A by PCR NEGATIVE NEGATIVE Final   Influenza B by PCR NEGATIVE NEGATIVE Final    Comment: (NOTE) The Xpert Xpress SARS-CoV-2/FLU/RSV plus assay is intended as an aid in the diagnosis of influenza from Nasopharyngeal swab specimens and should not be used as a sole basis for treatment. Nasal washings and aspirates are unacceptable for Xpert Xpress SARS-CoV-2/FLU/RSV testing.  Fact Sheet for Patients: BloggerCourse.com  Fact Sheet for Healthcare Providers: SeriousBroker.it  This test is not yet approved or cleared by the [place] FDA and has been authorized for detection and/or diagnosis of SARS-CoV-2 by FDA under an Emergency Use  Authorization (EUA). This EUA will remain in effect (meaning this test can be used) for the duration of the COVID-19 declaration under Section 564(b)(1) of the Act, 21 U.S.C. section 360bbb-3(b)(1), unless the authorization is terminated or revoked.     Resp Syncytial Virus by PCR NEGATIVE NEGATIVE Final    Comment: (NOTE) Fact Sheet for Patients: BloggerCourse.com  Fact Sheet for Healthcare Providers: SeriousBroker.it  This test is not yet approved or cleared by the [place] FDA and has been authorized for detection and/or diagnosis of SARS-CoV-2 by FDA under an Emergency Use Authorization (EUA). This EUA will remain in effect (meaning this test can be used) for the duration of the COVID-19 declaration under Section 564(b)(1) of the Act, 21 U.S.C. section 360bbb-3(b)(1), unless the authorization is terminated or revoked.  Performed at [facility], [address], [place], [place] [address]   Culture, blood (Routine X 2) w Reflex to ID Panel     Status: None (Preliminary result)   Collection Time: 10/20/23  1:23 AM   Specimen: BLOOD LEFT ARM  Result Value Ref Range Status   Specimen Description BLOOD LEFT ARM  Final   Special Requests   Final    BOTTLES DRAWN AEROBIC AND ANAEROBIC Blood Culture results may not be optimal due to an inadequate volume of blood received in culture bottles   Culture   Final    NO GROWTH 4 DAYS Performed at [facility], [address], [place], [place] [address]    Report Status PENDING  Incomplete  Culture, blood (Routine X 2) w Reflex to ID Panel     Status: None (Preliminary result)   Collection Time: 10/20/23  1:23 AM   Specimen: BLOOD RIGHT HAND  Result Value Ref Range Status   Specimen Description BLOOD RIGHT HAND  Final   Special Requests   Final    BOTTLES DRAWN AEROBIC AND ANAEROBIC Blood Culture adequate volume   Culture   Final    NO GROWTH 4 DAYS Performed at  [facility], [address], [place], [place] [address]  Report Status PENDING  Incomplete  Urine Culture     Status: Abnormal   Collection Time: 10/20/23  2:08 AM   Specimen: Urine, Random  Result Value Ref Range Status   Specimen Description URINE, RANDOM  Final   Special Requests NONE Reflexed from [phone]  Final   Culture (A)  Final    >=100,000 COLONIES/mL STREPTOCOCCUS AGALACTIAE TESTING AGAINST S. AGALACTIAE NOT ROUTINELY PERFORMED DUE TO PREDICTABILITY OF AMP/PEN/VAN SUSCEPTIBILITY. Performed at [facility], [address] [place], [place] [address]    Report Status 10/21/2023 FINAL  Final     Discharge Instructions:   Discharge Instructions     Diet - low sodium heart healthy   Complete by: As directed    Diet Carb Modified   Complete by: As directed    Increase activity slowly   Complete by: As directed       Allergies as of 10/25/2023   No Known Allergies      Medication List     STOP taking these medications    B-D ULTRAFINE III SHORT PEN 31G X 8 MM Misc Generic drug: Insulin Pen Needle   calcitRIOL 0.25 MCG capsule Commonly known as: ROCALTROL   FreeStyle Libre 2 Sensor Misc   hydrochlorothiazide 25 MG tablet Commonly known as: HYDRODIURIL   Trulicity 1.5 MG/0.5ML Soaj Generic drug: Dulaglutide       TAKE these medications    amLODipine 10 MG tablet Commonly known as: NORVASC Take 10 mg by mouth daily.   Aspirin 81 81 MG chewable tablet Generic drug: aspirin 1 tablet   ferrous sulfate 325 (65 FE) MG tablet Take 1 tablet (325 mg total) by mouth daily with breakfast. Start taking on: October 26, 2023   Gvoke HypoPen 2-Pack 1 MG/0.2ML Soaj Generic drug: Glucagon Inject 1 mg into the skin once as needed (for hypoglycemia).   HumaLOG KwikPen 100 UNIT/ML KwikPen Generic drug: insulin lispro Inject 4 Units into the skin 3 (three) times daily after meals.   hydrALAZINE 25 MG tablet Commonly known as:  APRESOLINE Take 50 mg by mouth 2 (two) times daily with a meal.   lisinopril 40 MG tablet Commonly known as: ZESTRIL Take 40 mg by mouth daily.   rosuvastatin 10 MG tablet Commonly known as: CRESTOR Take 10 mg by mouth daily.   sodium bicarbonate 650 MG tablet Take 650 mg by mouth daily.   Xarelto 2.5 MG Tabs tablet Generic drug: rivaroxaban TAKE 1 TABLET BY MOUTH TWICE A DAY. TAKE WITH ASPIRIN 81MG  DAILY.        Follow-up Information     [facility] Follow up.   Specialty: Home Health Services Why: Agency will call you to set up apt times Contact information: [address] [place] [address] [phone]                  Time coordinating discharge: 45 min  Signed:  Joseph Art DO  [facility] 10/25/2023, 9:17 AM

## 2023-10-25 NOTE — Progress Notes (Signed)
Occupational Therapy Treatment Patient Details Name: Jonathan Baird MRN: [id number] DOB: Apr 16, 1933 Today's Date: 10/25/2023   History of present illness Pt is an 89 y/o M presenting to ED on 12/7 with syncope, cough and vomiting, CXR with conerns for possible bil PNA. Admitted for CAP.    PMH includes mild dementia, HTN, HLD, insulin dependent DM2, CKD IV, PVD and R internal carotid artery stenosis on xarelto, colon CA s/p hemicolectomy and chemo in 2005   OT comments  Pt making incremental progress towards OT goals though continues to require hands on assist for all standing activities due to balance deficits. Pt requires Min-Mod A for standing and mobility in room with RW, Min A for ADLs standing at sink with noted endurance deficits. Educated pt re: fall prevention, use of gait belt by family for all mobility and energy conservation strategies. No family at bedside at time of OT session; noted family prefer pt to DC home rather than postacute rehab facility. Given mobility deficits, pt may benefit from wheelchair for long distances and when out in community.       If plan is discharge home, recommend the following:  A lot of help with walking and/or transfers;A lot of help with bathing/dressing/bathroom   Equipment Recommendations  Wheelchair cushion (measurements OT);Wheelchair (measurements OT)    Recommendations for Other Services      Precautions / Restrictions Precautions Precautions: Fall Restrictions Weight Bearing Restrictions Per Provider Order: No       Mobility Bed Mobility               General bed mobility comments: in recliner on entry    Transfers Overall transfer level: Needs assistance Equipment used: Rolling walker (2 wheels) Transfers: Sit to/from Stand Sit to Stand: Min assist, Mod assist           General transfer comment: variable Min-Mod A to stand from recliner and chair with armrests at sink. Trial of various hand placements due to pt  tendency to pull up on RW and noted posterior bias     Balance Overall balance assessment: Needs assistance Sitting-balance support: Feet supported Sitting balance-Leahy Scale: Fair     Standing balance support: During functional activity, No upper extremity supported, Bilateral upper extremity supported Standing balance-Leahy Scale: Poor Standing balance comment: reliant on RW and leaning heavily on sink with ADls                           ADL either performed or assessed with clinical judgement   ADL Overall ADL's : Needs assistance/impaired     Grooming: Minimal assistance;Standing;Wash/dry face Grooming Details (indicate cue type and reason): Min A for balance as pt with gradual knee flexion and kyphotic posturing with fatigue.                             Functional mobility during ADLs: Minimal assistance;Rolling walker (2 wheels);Cueing for sequencing;Cueing for safety General ADL Comments: Emphasis on energy conservation, fall prevention and hands on assist at home for mobility. Provided gait belt for pt use at home- educated pt to have family use this for walking and to manage steps into home.    Extremity/Trunk Assessment Upper Extremity Assessment Upper Extremity Assessment: Right hand dominant;RUE deficits/detail;LUE deficits/detail RUE Deficits / Details: 3+/5, impaired coordination RUE Coordination: decreased gross motor;decreased fine motor LUE Deficits / Details: grossly 3+ or 4-/5; slightly stronger than Rt UE;  finger to nose impaired bil but worse on Rt UE LUE Coordination: decreased gross motor;decreased fine motor   Lower Extremity Assessment Lower Extremity Assessment: Defer to PT evaluation        Vision   Vision Assessment?: Vision impaired- to be further tested in functional context Additional Comments: hx of diabetes per pt- some difficulty navigating obstacles in rooom but able to read large print calendar in room. difficult to  fully tease out visual deficits   Perception     Praxis      Cognition Arousal: Alert Behavior During Therapy: WFL for tasks assessed/performed, Flat affect Overall Cognitive Status: No family/caregiver present to determine baseline cognitive functioning Area of Impairment: Following commands, Attention, Awareness, Problem solving, Safety/judgement                   Current Attention Level: Selective   Following Commands: Follows one step commands consistently, Follows multi-step commands with increased time Safety/Judgement: Decreased awareness of deficits Awareness: Emergent Problem Solving: Slow processing, Decreased initiation, Difficulty sequencing, Requires verbal cues General Comments: pleasant, able to recall prior hospital events. cues for problem solving, safety awareness and increased time for task completion        Exercises      Shoulder Instructions       General Comments      Pertinent Vitals/ Pain       Pain Assessment Pain Assessment: No/denies pain  Home Living                                          Prior Functioning/Environment              Frequency  Min 1X/week        Progress Toward Goals  OT Goals(current goals can now be found in the care plan section)  Progress towards OT goals: Progressing toward goals  Acute Rehab OT Goals Patient Stated Goal: go home today, get beard trimmed up OT Goal Formulation: With patient Time For Goal Achievement: 11/04/23 Potential to Achieve Goals: Good ADL Goals Pt Will Perform Upper Body Dressing: with supervision;standing;sitting Pt Will Perform Lower Body Dressing: with supervision;sitting/lateral leans;sit to/from stand Pt Will Transfer to Toilet: with supervision;ambulating;regular height toilet Pt Will Perform Tub/Shower Transfer: with supervision;ambulating;Shower transfer;shower seat  Plan      Co-evaluation                 AM-PAC OT "6 Clicks" Daily  Activity     Outcome Measure   Help from another person eating meals?: A Little Help from another person taking care of personal grooming?: A Little Help from another person toileting, which includes using toliet, bedpan, or urinal?: A Lot Help from another person bathing (including washing, rinsing, drying)?: A Lot Help from another person to put on and taking off regular upper body clothing?: A Little Help from another person to put on and taking off regular lower body clothing?: A Lot 6 Click Score: 15    End of Session Equipment Utilized During Treatment: Gait belt;Rolling walker (2 wheels)  OT Visit Diagnosis: Unsteadiness on feet (R26.81);Other abnormalities of gait and mobility (R26.89);Muscle weakness (generalized) (M62.81)   Activity Tolerance Patient tolerated treatment well   Patient Left in chair;with call bell/phone within reach;with chair alarm set   Nurse Communication Mobility status        Time: 0935-1000 OT Time Calculation (min): 25  min  Charges: OT General Charges $OT Visit: 1 Visit OT Treatments $Self Care/Home Management : 23-37 mins  Bradd Canary, OTR/L Acute Rehab Services Office: [phone]   Lorre Munroe 10/25/2023, 10:29 AM

## 2023-10-29 DIAGNOSIS — F03A0 Unspecified dementia, mild, without behavioral disturbance, psychotic disturbance, mood disturbance, and anxiety: Secondary | ICD-10-CM | POA: Diagnosis not present

## 2023-10-29 DIAGNOSIS — I6523 Occlusion and stenosis of bilateral carotid arteries: Secondary | ICD-10-CM | POA: Diagnosis not present

## 2023-10-29 DIAGNOSIS — E782 Mixed hyperlipidemia: Secondary | ICD-10-CM | POA: Diagnosis not present

## 2023-10-29 DIAGNOSIS — D631 Anemia in chronic kidney disease: Secondary | ICD-10-CM | POA: Diagnosis not present

## 2023-10-29 DIAGNOSIS — I1310 Hypertensive heart and chronic kidney disease without heart failure, with stage 1 through stage 4 chronic kidney disease, or unspecified chronic kidney disease: Secondary | ICD-10-CM | POA: Diagnosis not present

## 2023-10-29 DIAGNOSIS — E1151 Type 2 diabetes mellitus with diabetic peripheral angiopathy without gangrene: Secondary | ICD-10-CM | POA: Diagnosis not present

## 2023-10-29 DIAGNOSIS — E1122 Type 2 diabetes mellitus with diabetic chronic kidney disease: Secondary | ICD-10-CM | POA: Diagnosis not present

## 2023-10-29 DIAGNOSIS — J189 Pneumonia, unspecified organism: Secondary | ICD-10-CM | POA: Diagnosis not present

## 2023-10-29 DIAGNOSIS — N184 Chronic kidney disease, stage 4 (severe): Secondary | ICD-10-CM | POA: Diagnosis not present

## 2023-10-30 DIAGNOSIS — E782 Mixed hyperlipidemia: Secondary | ICD-10-CM | POA: Diagnosis not present

## 2023-10-30 DIAGNOSIS — D631 Anemia in chronic kidney disease: Secondary | ICD-10-CM | POA: Diagnosis not present

## 2023-10-30 DIAGNOSIS — E1151 Type 2 diabetes mellitus with diabetic peripheral angiopathy without gangrene: Secondary | ICD-10-CM | POA: Diagnosis not present

## 2023-10-30 DIAGNOSIS — N184 Chronic kidney disease, stage 4 (severe): Secondary | ICD-10-CM | POA: Diagnosis not present

## 2023-10-30 DIAGNOSIS — F03A0 Unspecified dementia, mild, without behavioral disturbance, psychotic disturbance, mood disturbance, and anxiety: Secondary | ICD-10-CM | POA: Diagnosis not present

## 2023-10-30 DIAGNOSIS — I1310 Hypertensive heart and chronic kidney disease without heart failure, with stage 1 through stage 4 chronic kidney disease, or unspecified chronic kidney disease: Secondary | ICD-10-CM | POA: Diagnosis not present

## 2023-10-30 DIAGNOSIS — E1122 Type 2 diabetes mellitus with diabetic chronic kidney disease: Secondary | ICD-10-CM | POA: Diagnosis not present

## 2023-10-30 DIAGNOSIS — I6523 Occlusion and stenosis of bilateral carotid arteries: Secondary | ICD-10-CM | POA: Diagnosis not present

## 2023-10-30 DIAGNOSIS — J189 Pneumonia, unspecified organism: Secondary | ICD-10-CM | POA: Diagnosis not present

## 2023-10-31 DIAGNOSIS — F01A0 Vascular dementia, mild, without behavioral disturbance, psychotic disturbance, mood disturbance, and anxiety: Secondary | ICD-10-CM | POA: Diagnosis not present

## 2023-10-31 DIAGNOSIS — E1122 Type 2 diabetes mellitus with diabetic chronic kidney disease: Secondary | ICD-10-CM | POA: Diagnosis not present

## 2023-10-31 DIAGNOSIS — E782 Mixed hyperlipidemia: Secondary | ICD-10-CM | POA: Diagnosis not present

## 2023-10-31 DIAGNOSIS — I739 Peripheral vascular disease, unspecified: Secondary | ICD-10-CM | POA: Diagnosis not present

## 2023-10-31 DIAGNOSIS — I6529 Occlusion and stenosis of unspecified carotid artery: Secondary | ICD-10-CM | POA: Diagnosis not present

## 2023-10-31 DIAGNOSIS — N184 Chronic kidney disease, stage 4 (severe): Secondary | ICD-10-CM | POA: Diagnosis not present

## 2023-10-31 DIAGNOSIS — G9340 Encephalopathy, unspecified: Secondary | ICD-10-CM | POA: Diagnosis not present

## 2023-10-31 DIAGNOSIS — D6869 Other thrombophilia: Secondary | ICD-10-CM | POA: Diagnosis not present

## 2023-10-31 DIAGNOSIS — D692 Other nonthrombocytopenic purpura: Secondary | ICD-10-CM | POA: Diagnosis not present

## 2023-11-03 DIAGNOSIS — E1165 Type 2 diabetes mellitus with hyperglycemia: Secondary | ICD-10-CM | POA: Diagnosis not present

## 2023-11-08 DIAGNOSIS — D631 Anemia in chronic kidney disease: Secondary | ICD-10-CM | POA: Diagnosis not present

## 2023-11-08 DIAGNOSIS — N184 Chronic kidney disease, stage 4 (severe): Secondary | ICD-10-CM | POA: Diagnosis not present

## 2023-11-08 DIAGNOSIS — I1310 Hypertensive heart and chronic kidney disease without heart failure, with stage 1 through stage 4 chronic kidney disease, or unspecified chronic kidney disease: Secondary | ICD-10-CM | POA: Diagnosis not present

## 2023-11-08 DIAGNOSIS — J189 Pneumonia, unspecified organism: Secondary | ICD-10-CM | POA: Diagnosis not present

## 2023-11-08 DIAGNOSIS — I6523 Occlusion and stenosis of bilateral carotid arteries: Secondary | ICD-10-CM | POA: Diagnosis not present

## 2023-11-08 DIAGNOSIS — E782 Mixed hyperlipidemia: Secondary | ICD-10-CM | POA: Diagnosis not present

## 2023-11-08 DIAGNOSIS — F03A0 Unspecified dementia, mild, without behavioral disturbance, psychotic disturbance, mood disturbance, and anxiety: Secondary | ICD-10-CM | POA: Diagnosis not present

## 2023-11-08 DIAGNOSIS — E1151 Type 2 diabetes mellitus with diabetic peripheral angiopathy without gangrene: Secondary | ICD-10-CM | POA: Diagnosis not present

## 2023-11-08 DIAGNOSIS — E1122 Type 2 diabetes mellitus with diabetic chronic kidney disease: Secondary | ICD-10-CM | POA: Diagnosis not present

## 2023-11-11 DIAGNOSIS — D631 Anemia in chronic kidney disease: Secondary | ICD-10-CM | POA: Diagnosis not present

## 2023-11-11 DIAGNOSIS — J189 Pneumonia, unspecified organism: Secondary | ICD-10-CM | POA: Diagnosis not present

## 2023-11-11 DIAGNOSIS — E1122 Type 2 diabetes mellitus with diabetic chronic kidney disease: Secondary | ICD-10-CM | POA: Diagnosis not present

## 2023-11-11 DIAGNOSIS — I6523 Occlusion and stenosis of bilateral carotid arteries: Secondary | ICD-10-CM | POA: Diagnosis not present

## 2023-11-11 DIAGNOSIS — I1310 Hypertensive heart and chronic kidney disease without heart failure, with stage 1 through stage 4 chronic kidney disease, or unspecified chronic kidney disease: Secondary | ICD-10-CM | POA: Diagnosis not present

## 2023-11-11 DIAGNOSIS — E1151 Type 2 diabetes mellitus with diabetic peripheral angiopathy without gangrene: Secondary | ICD-10-CM | POA: Diagnosis not present

## 2023-11-11 DIAGNOSIS — N184 Chronic kidney disease, stage 4 (severe): Secondary | ICD-10-CM | POA: Diagnosis not present

## 2023-11-11 DIAGNOSIS — F03A0 Unspecified dementia, mild, without behavioral disturbance, psychotic disturbance, mood disturbance, and anxiety: Secondary | ICD-10-CM | POA: Diagnosis not present

## 2023-11-11 DIAGNOSIS — E782 Mixed hyperlipidemia: Secondary | ICD-10-CM | POA: Diagnosis not present

## 2023-11-12 DIAGNOSIS — E1165 Type 2 diabetes mellitus with hyperglycemia: Secondary | ICD-10-CM | POA: Diagnosis not present

## 2023-11-12 DIAGNOSIS — E1139 Type 2 diabetes mellitus with other diabetic ophthalmic complication: Secondary | ICD-10-CM | POA: Diagnosis not present

## 2023-11-12 DIAGNOSIS — E782 Mixed hyperlipidemia: Secondary | ICD-10-CM | POA: Diagnosis not present

## 2023-11-12 DIAGNOSIS — I129 Hypertensive chronic kidney disease with stage 1 through stage 4 chronic kidney disease, or unspecified chronic kidney disease: Secondary | ICD-10-CM | POA: Diagnosis not present

## 2023-11-19 DIAGNOSIS — J189 Pneumonia, unspecified organism: Secondary | ICD-10-CM | POA: Diagnosis not present

## 2023-11-19 DIAGNOSIS — I1310 Hypertensive heart and chronic kidney disease without heart failure, with stage 1 through stage 4 chronic kidney disease, or unspecified chronic kidney disease: Secondary | ICD-10-CM | POA: Diagnosis not present

## 2023-11-19 DIAGNOSIS — E1122 Type 2 diabetes mellitus with diabetic chronic kidney disease: Secondary | ICD-10-CM | POA: Diagnosis not present

## 2023-11-20 DIAGNOSIS — E782 Mixed hyperlipidemia: Secondary | ICD-10-CM | POA: Diagnosis not present

## 2023-11-20 DIAGNOSIS — E1122 Type 2 diabetes mellitus with diabetic chronic kidney disease: Secondary | ICD-10-CM | POA: Diagnosis not present

## 2023-11-20 DIAGNOSIS — E1151 Type 2 diabetes mellitus with diabetic peripheral angiopathy without gangrene: Secondary | ICD-10-CM | POA: Diagnosis not present

## 2023-11-20 DIAGNOSIS — D631 Anemia in chronic kidney disease: Secondary | ICD-10-CM | POA: Diagnosis not present

## 2023-11-20 DIAGNOSIS — F03A0 Unspecified dementia, mild, without behavioral disturbance, psychotic disturbance, mood disturbance, and anxiety: Secondary | ICD-10-CM | POA: Diagnosis not present

## 2023-11-20 DIAGNOSIS — I1310 Hypertensive heart and chronic kidney disease without heart failure, with stage 1 through stage 4 chronic kidney disease, or unspecified chronic kidney disease: Secondary | ICD-10-CM | POA: Diagnosis not present

## 2023-11-20 DIAGNOSIS — N184 Chronic kidney disease, stage 4 (severe): Secondary | ICD-10-CM | POA: Diagnosis not present

## 2023-11-20 DIAGNOSIS — J189 Pneumonia, unspecified organism: Secondary | ICD-10-CM | POA: Diagnosis not present

## 2023-11-20 DIAGNOSIS — I6523 Occlusion and stenosis of bilateral carotid arteries: Secondary | ICD-10-CM | POA: Diagnosis not present

## 2023-11-26 ENCOUNTER — Other Ambulatory Visit (HOSPITAL_COMMUNITY): Payer: Self-pay

## 2023-11-26 DIAGNOSIS — N184 Chronic kidney disease, stage 4 (severe): Secondary | ICD-10-CM | POA: Diagnosis not present

## 2023-11-26 DIAGNOSIS — D631 Anemia in chronic kidney disease: Secondary | ICD-10-CM | POA: Diagnosis not present

## 2023-11-26 DIAGNOSIS — E1151 Type 2 diabetes mellitus with diabetic peripheral angiopathy without gangrene: Secondary | ICD-10-CM | POA: Diagnosis not present

## 2023-11-26 DIAGNOSIS — I1310 Hypertensive heart and chronic kidney disease without heart failure, with stage 1 through stage 4 chronic kidney disease, or unspecified chronic kidney disease: Secondary | ICD-10-CM | POA: Diagnosis not present

## 2023-11-26 DIAGNOSIS — E1122 Type 2 diabetes mellitus with diabetic chronic kidney disease: Secondary | ICD-10-CM | POA: Diagnosis not present

## 2023-11-26 DIAGNOSIS — F03A0 Unspecified dementia, mild, without behavioral disturbance, psychotic disturbance, mood disturbance, and anxiety: Secondary | ICD-10-CM | POA: Diagnosis not present

## 2023-11-26 DIAGNOSIS — I6523 Occlusion and stenosis of bilateral carotid arteries: Secondary | ICD-10-CM | POA: Diagnosis not present

## 2023-11-26 DIAGNOSIS — J189 Pneumonia, unspecified organism: Secondary | ICD-10-CM | POA: Diagnosis not present

## 2023-11-26 DIAGNOSIS — E782 Mixed hyperlipidemia: Secondary | ICD-10-CM | POA: Diagnosis not present

## 2023-11-28 ENCOUNTER — Other Ambulatory Visit (HOSPITAL_COMMUNITY): Payer: Self-pay

## 2023-12-03 DIAGNOSIS — D631 Anemia in chronic kidney disease: Secondary | ICD-10-CM | POA: Diagnosis not present

## 2023-12-03 DIAGNOSIS — J189 Pneumonia, unspecified organism: Secondary | ICD-10-CM | POA: Diagnosis not present

## 2023-12-03 DIAGNOSIS — I1310 Hypertensive heart and chronic kidney disease without heart failure, with stage 1 through stage 4 chronic kidney disease, or unspecified chronic kidney disease: Secondary | ICD-10-CM | POA: Diagnosis not present

## 2023-12-03 DIAGNOSIS — E1151 Type 2 diabetes mellitus with diabetic peripheral angiopathy without gangrene: Secondary | ICD-10-CM | POA: Diagnosis not present

## 2023-12-03 DIAGNOSIS — E782 Mixed hyperlipidemia: Secondary | ICD-10-CM | POA: Diagnosis not present

## 2023-12-03 DIAGNOSIS — I6523 Occlusion and stenosis of bilateral carotid arteries: Secondary | ICD-10-CM | POA: Diagnosis not present

## 2023-12-03 DIAGNOSIS — F03A0 Unspecified dementia, mild, without behavioral disturbance, psychotic disturbance, mood disturbance, and anxiety: Secondary | ICD-10-CM | POA: Diagnosis not present

## 2023-12-03 DIAGNOSIS — N184 Chronic kidney disease, stage 4 (severe): Secondary | ICD-10-CM | POA: Diagnosis not present

## 2023-12-03 DIAGNOSIS — E1122 Type 2 diabetes mellitus with diabetic chronic kidney disease: Secondary | ICD-10-CM | POA: Diagnosis not present

## 2023-12-04 DIAGNOSIS — E1165 Type 2 diabetes mellitus with hyperglycemia: Secondary | ICD-10-CM | POA: Diagnosis not present

## 2023-12-18 DIAGNOSIS — D631 Anemia in chronic kidney disease: Secondary | ICD-10-CM | POA: Diagnosis not present

## 2023-12-18 DIAGNOSIS — E1122 Type 2 diabetes mellitus with diabetic chronic kidney disease: Secondary | ICD-10-CM | POA: Diagnosis not present

## 2023-12-18 DIAGNOSIS — I1310 Hypertensive heart and chronic kidney disease without heart failure, with stage 1 through stage 4 chronic kidney disease, or unspecified chronic kidney disease: Secondary | ICD-10-CM | POA: Diagnosis not present

## 2023-12-18 DIAGNOSIS — F03A0 Unspecified dementia, mild, without behavioral disturbance, psychotic disturbance, mood disturbance, and anxiety: Secondary | ICD-10-CM | POA: Diagnosis not present

## 2023-12-18 DIAGNOSIS — N184 Chronic kidney disease, stage 4 (severe): Secondary | ICD-10-CM | POA: Diagnosis not present

## 2023-12-18 DIAGNOSIS — I6523 Occlusion and stenosis of bilateral carotid arteries: Secondary | ICD-10-CM | POA: Diagnosis not present

## 2023-12-18 DIAGNOSIS — J189 Pneumonia, unspecified organism: Secondary | ICD-10-CM | POA: Diagnosis not present

## 2023-12-18 DIAGNOSIS — E782 Mixed hyperlipidemia: Secondary | ICD-10-CM | POA: Diagnosis not present

## 2023-12-18 DIAGNOSIS — E1151 Type 2 diabetes mellitus with diabetic peripheral angiopathy without gangrene: Secondary | ICD-10-CM | POA: Diagnosis not present

## 2023-12-24 DIAGNOSIS — I6523 Occlusion and stenosis of bilateral carotid arteries: Secondary | ICD-10-CM | POA: Diagnosis not present

## 2023-12-24 DIAGNOSIS — J189 Pneumonia, unspecified organism: Secondary | ICD-10-CM | POA: Diagnosis not present

## 2023-12-24 DIAGNOSIS — E1122 Type 2 diabetes mellitus with diabetic chronic kidney disease: Secondary | ICD-10-CM | POA: Diagnosis not present

## 2023-12-24 DIAGNOSIS — I1310 Hypertensive heart and chronic kidney disease without heart failure, with stage 1 through stage 4 chronic kidney disease, or unspecified chronic kidney disease: Secondary | ICD-10-CM | POA: Diagnosis not present

## 2023-12-24 DIAGNOSIS — E1151 Type 2 diabetes mellitus with diabetic peripheral angiopathy without gangrene: Secondary | ICD-10-CM | POA: Diagnosis not present

## 2023-12-24 DIAGNOSIS — E782 Mixed hyperlipidemia: Secondary | ICD-10-CM | POA: Diagnosis not present

## 2023-12-24 DIAGNOSIS — F03A0 Unspecified dementia, mild, without behavioral disturbance, psychotic disturbance, mood disturbance, and anxiety: Secondary | ICD-10-CM | POA: Diagnosis not present

## 2023-12-24 DIAGNOSIS — D631 Anemia in chronic kidney disease: Secondary | ICD-10-CM | POA: Diagnosis not present

## 2023-12-24 DIAGNOSIS — N184 Chronic kidney disease, stage 4 (severe): Secondary | ICD-10-CM | POA: Diagnosis not present

## 2023-12-30 ENCOUNTER — Encounter (HOSPITAL_COMMUNITY): Payer: Self-pay

## 2023-12-30 ENCOUNTER — Other Ambulatory Visit: Payer: Self-pay

## 2023-12-30 ENCOUNTER — Observation Stay (HOSPITAL_COMMUNITY)
Admission: EM | Admit: 2023-12-30 | Discharge: 2023-12-31 | Disposition: A | Discharge status: Home Health | Payer: Medicare PPO | Attending: Internal Medicine | Admitting: Internal Medicine

## 2023-12-30 DIAGNOSIS — Z85038 Personal history of other malignant neoplasm of large intestine: Secondary | ICD-10-CM | POA: Insufficient documentation

## 2023-12-30 DIAGNOSIS — I129 Hypertensive chronic kidney disease with stage 1 through stage 4 chronic kidney disease, or unspecified chronic kidney disease: Secondary | ICD-10-CM | POA: Insufficient documentation

## 2023-12-30 DIAGNOSIS — N400 Enlarged prostate without lower urinary tract symptoms: Secondary | ICD-10-CM

## 2023-12-30 DIAGNOSIS — I672 Cerebral atherosclerosis: Secondary | ICD-10-CM | POA: Diagnosis not present

## 2023-12-30 DIAGNOSIS — Z79899 Other long term (current) drug therapy: Secondary | ICD-10-CM | POA: Insufficient documentation

## 2023-12-30 DIAGNOSIS — D631 Anemia in chronic kidney disease: Secondary | ICD-10-CM | POA: Diagnosis not present

## 2023-12-30 DIAGNOSIS — Z8679 Personal history of other diseases of the circulatory system: Secondary | ICD-10-CM

## 2023-12-30 DIAGNOSIS — E1122 Type 2 diabetes mellitus with diabetic chronic kidney disease: Secondary | ICD-10-CM | POA: Insufficient documentation

## 2023-12-30 DIAGNOSIS — F0390 Unspecified dementia without behavioral disturbance: Secondary | ICD-10-CM | POA: Diagnosis not present

## 2023-12-30 DIAGNOSIS — E875 Hyperkalemia: Secondary | ICD-10-CM | POA: Insufficient documentation

## 2023-12-30 DIAGNOSIS — R9082 White matter disease, unspecified: Secondary | ICD-10-CM | POA: Diagnosis not present

## 2023-12-30 DIAGNOSIS — M6281 Muscle weakness (generalized): Principal | ICD-10-CM | POA: Insufficient documentation

## 2023-12-30 DIAGNOSIS — N184 Chronic kidney disease, stage 4 (severe): Secondary | ICD-10-CM | POA: Diagnosis not present

## 2023-12-30 DIAGNOSIS — Z7982 Long term (current) use of aspirin: Secondary | ICD-10-CM | POA: Diagnosis not present

## 2023-12-30 DIAGNOSIS — R4182 Altered mental status, unspecified: Secondary | ICD-10-CM | POA: Diagnosis not present

## 2023-12-30 DIAGNOSIS — E785 Hyperlipidemia, unspecified: Secondary | ICD-10-CM | POA: Diagnosis present

## 2023-12-30 DIAGNOSIS — R0989 Other specified symptoms and signs involving the circulatory and respiratory systems: Secondary | ICD-10-CM | POA: Diagnosis not present

## 2023-12-30 DIAGNOSIS — R410 Disorientation, unspecified: Secondary | ICD-10-CM | POA: Diagnosis not present

## 2023-12-30 DIAGNOSIS — R531 Weakness: Secondary | ICD-10-CM | POA: Diagnosis not present

## 2023-12-30 DIAGNOSIS — Z86711 Personal history of pulmonary embolism: Secondary | ICD-10-CM | POA: Insufficient documentation

## 2023-12-30 DIAGNOSIS — Z9889 Other specified postprocedural states: Secondary | ICD-10-CM | POA: Diagnosis not present

## 2023-12-30 DIAGNOSIS — I10 Essential (primary) hypertension: Secondary | ICD-10-CM | POA: Diagnosis present

## 2023-12-30 DIAGNOSIS — Z8659 Personal history of other mental and behavioral disorders: Secondary | ICD-10-CM

## 2023-12-30 DIAGNOSIS — I739 Peripheral vascular disease, unspecified: Secondary | ICD-10-CM | POA: Diagnosis present

## 2023-12-30 DIAGNOSIS — R3911 Hesitancy of micturition: Secondary | ICD-10-CM | POA: Insufficient documentation

## 2023-12-30 DIAGNOSIS — N179 Acute kidney failure, unspecified: Secondary | ICD-10-CM

## 2023-12-30 DIAGNOSIS — R350 Frequency of micturition: Secondary | ICD-10-CM | POA: Diagnosis not present

## 2023-12-30 DIAGNOSIS — Z7901 Long term (current) use of anticoagulants: Secondary | ICD-10-CM | POA: Diagnosis not present

## 2023-12-30 DIAGNOSIS — Z794 Long term (current) use of insulin: Secondary | ICD-10-CM | POA: Insufficient documentation

## 2023-12-30 DIAGNOSIS — E119 Type 2 diabetes mellitus without complications: Secondary | ICD-10-CM

## 2023-12-30 LAB — CBC WITH DIFFERENTIAL/PLATELET
Abs Immature Granulocytes: 0.03 10*3/uL (ref 0.00–0.07)
Basophils Absolute: 0 10*3/uL (ref 0.0–0.1)
Basophils Relative: 0 %
Eosinophils Absolute: 0.3 10*3/uL (ref 0.0–0.5)
Eosinophils Relative: 3 %
HCT: 29.7 % — ABNORMAL LOW (ref 39.0–52.0)
Hemoglobin: 9.7 g/dL — ABNORMAL LOW (ref 13.0–17.0)
Immature Granulocytes: 0 %
Lymphocytes Relative: 17 %
Lymphs Abs: 1.5 10*3/uL (ref 0.7–4.0)
MCH: 26 pg (ref 26.0–34.0)
MCHC: 32.7 g/dL (ref 30.0–36.0)
MCV: 79.6 fL — ABNORMAL LOW (ref 80.0–100.0)
Monocytes Absolute: 0.6 10*3/uL (ref 0.1–1.0)
Monocytes Relative: 6 %
Neutro Abs: 6.5 10*3/uL (ref 1.7–7.7)
Neutrophils Relative %: 74 %
Platelets: 240 10*3/uL (ref 150–400)
RBC: 3.73 MIL/uL — ABNORMAL LOW (ref 4.22–5.81)
RDW: 15.4 % (ref 11.5–15.5)
WBC: 8.9 10*3/uL (ref 4.0–10.5)
nRBC: 0 % (ref 0.0–0.2)

## 2023-12-30 LAB — COMPREHENSIVE METABOLIC PANEL
ALT: 18 U/L (ref 0–44)
AST: 21 U/L (ref 15–41)
Albumin: 3.4 g/dL — ABNORMAL LOW (ref 3.5–5.0)
Alkaline Phosphatase: 75 U/L (ref 38–126)
Anion gap: 11 (ref 5–15)
BUN: 42 mg/dL — ABNORMAL HIGH (ref 8–23)
CO2: 19 mmol/L — ABNORMAL LOW (ref 22–32)
Calcium: 9.1 mg/dL (ref 8.9–10.3)
Chloride: 108 mmol/L (ref 98–111)
Creatinine, Ser: 2.93 mg/dL — ABNORMAL HIGH (ref 0.61–1.24)
GFR, Estimated: 20 mL/min — ABNORMAL LOW (ref >60)
Glucose, Bld: 140 mg/dL — ABNORMAL HIGH (ref 70–99)
Potassium: 5.2 mmol/L — ABNORMAL HIGH (ref 3.5–5.1)
Sodium: 138 mmol/L (ref 135–145)
Total Bilirubin: 0.5 mg/dL (ref 0.0–1.2)
Total Protein: 7.4 g/dL (ref 6.5–8.1)

## 2023-12-30 LAB — CBG MONITORING, ED: Glucose-Capillary: 151 mg/dL — ABNORMAL HIGH (ref 70–99)

## 2023-12-30 NOTE — ED Triage Notes (Signed)
Patient family reports patient has had brown urine with foul odor.  Went to PCP but bc he couldn't urinate sent him here.  Patient denies pain, denies fever chills.  Family reports some confusion.

## 2023-12-30 NOTE — ED Provider Triage Note (Cosign Needed Addendum)
Emergency Medicine Provider Triage Evaluation Note  Jonathan Baird , a 89 y.o. male  was evaluated in triage.  Pt complains of dysuria and confusion.  Family member mentioned that his urine has been more brown and is malodorous.  Noticed this this past Friday.  Also mention that he is more confused than usual.  Does have baseline dementia.  Denies fever.  Patient denies abdominal or chest pain.  Review of Systems  Positive: See above Negative: See above  Physical Exam  BP (!) 176/69 (BP Location: Right Arm)   Pulse 61   Temp 98.5 F (36.9 C) (Oral)   Resp 16   Ht 5\' 11"  (1.803 m)   Wt 72.1 kg   SpO2 100%   BMI 22.18 kg/m  Gen:   Awake, no distress   Resp:  Normal effort  MSK:   Moves extremities without difficulty  Other:  Abdomen is nontender and soft.  No CVA tenderness.  Medical Decision Making  Medically screening exam initiated at 5:09 PM.  Appropriate orders placed.  Peyton Najjar was informed that the remainder of the evaluation will be completed by another provider, this initial triage assessment does not replace that evaluation, and the importance of remaining in the ED until their evaluation is complete.  Work up started      Gareth Eagle, PA-C 12/30/23 1712

## 2023-12-31 ENCOUNTER — Emergency Department (HOSPITAL_COMMUNITY): Payer: Medicare PPO

## 2023-12-31 ENCOUNTER — Observation Stay (HOSPITAL_COMMUNITY): Payer: Medicare PPO

## 2023-12-31 ENCOUNTER — Encounter (HOSPITAL_COMMUNITY): Payer: Self-pay | Admitting: Internal Medicine

## 2023-12-31 DIAGNOSIS — I10 Essential (primary) hypertension: Secondary | ICD-10-CM

## 2023-12-31 DIAGNOSIS — Z794 Long term (current) use of insulin: Secondary | ICD-10-CM

## 2023-12-31 DIAGNOSIS — N184 Chronic kidney disease, stage 4 (severe): Secondary | ICD-10-CM | POA: Insufficient documentation

## 2023-12-31 DIAGNOSIS — Z8679 Personal history of other diseases of the circulatory system: Secondary | ICD-10-CM | POA: Diagnosis not present

## 2023-12-31 DIAGNOSIS — I6782 Cerebral ischemia: Secondary | ICD-10-CM | POA: Diagnosis not present

## 2023-12-31 DIAGNOSIS — Z85038 Personal history of other malignant neoplasm of large intestine: Secondary | ICD-10-CM | POA: Diagnosis not present

## 2023-12-31 DIAGNOSIS — E7849 Other hyperlipidemia: Secondary | ICD-10-CM | POA: Diagnosis not present

## 2023-12-31 DIAGNOSIS — N400 Enlarged prostate without lower urinary tract symptoms: Secondary | ICD-10-CM

## 2023-12-31 DIAGNOSIS — R531 Weakness: Secondary | ICD-10-CM

## 2023-12-31 DIAGNOSIS — E875 Hyperkalemia: Secondary | ICD-10-CM | POA: Insufficient documentation

## 2023-12-31 DIAGNOSIS — R0989 Other specified symptoms and signs involving the circulatory and respiratory systems: Secondary | ICD-10-CM | POA: Diagnosis not present

## 2023-12-31 DIAGNOSIS — R3911 Hesitancy of micturition: Secondary | ICD-10-CM | POA: Insufficient documentation

## 2023-12-31 DIAGNOSIS — R9082 White matter disease, unspecified: Secondary | ICD-10-CM | POA: Diagnosis not present

## 2023-12-31 DIAGNOSIS — Z8659 Personal history of other mental and behavioral disorders: Secondary | ICD-10-CM

## 2023-12-31 DIAGNOSIS — G319 Degenerative disease of nervous system, unspecified: Secondary | ICD-10-CM | POA: Diagnosis not present

## 2023-12-31 DIAGNOSIS — R4182 Altered mental status, unspecified: Secondary | ICD-10-CM | POA: Diagnosis not present

## 2023-12-31 DIAGNOSIS — E119 Type 2 diabetes mellitus without complications: Secondary | ICD-10-CM

## 2023-12-31 DIAGNOSIS — I672 Cerebral atherosclerosis: Secondary | ICD-10-CM | POA: Diagnosis not present

## 2023-12-31 DIAGNOSIS — I739 Peripheral vascular disease, unspecified: Secondary | ICD-10-CM

## 2023-12-31 DIAGNOSIS — G9389 Other specified disorders of brain: Secondary | ICD-10-CM | POA: Diagnosis not present

## 2023-12-31 LAB — CBC
HCT: 29.6 % — ABNORMAL LOW (ref 39.0–52.0)
Hemoglobin: 10 g/dL — ABNORMAL LOW (ref 13.0–17.0)
MCH: 26 pg (ref 26.0–34.0)
MCHC: 33.8 g/dL (ref 30.0–36.0)
MCV: 76.9 fL — ABNORMAL LOW (ref 80.0–100.0)
Platelets: 259 10*3/uL (ref 150–400)
RBC: 3.85 MIL/uL — ABNORMAL LOW (ref 4.22–5.81)
RDW: 15.2 % (ref 11.5–15.5)
WBC: 7.3 10*3/uL (ref 4.0–10.5)
nRBC: 0 % (ref 0.0–0.2)

## 2023-12-31 LAB — URINALYSIS, ROUTINE W REFLEX MICROSCOPIC
Bilirubin Urine: NEGATIVE
Glucose, UA: 50 mg/dL — AB
Ketones, ur: NEGATIVE mg/dL
Leukocytes,Ua: NEGATIVE
Nitrite: NEGATIVE
Protein, ur: 100 mg/dL — AB
Specific Gravity, Urine: 1.012 (ref 1.005–1.030)
pH: 5 (ref 5.0–8.0)

## 2023-12-31 LAB — GLUCOSE, CAPILLARY
Glucose-Capillary: 158 mg/dL — ABNORMAL HIGH (ref 70–99)
Glucose-Capillary: 170 mg/dL — ABNORMAL HIGH (ref 70–99)
Glucose-Capillary: 196 mg/dL — ABNORMAL HIGH (ref 70–99)

## 2023-12-31 LAB — COMPREHENSIVE METABOLIC PANEL
ALT: 16 U/L (ref 0–44)
AST: 17 U/L (ref 15–41)
Albumin: 3.4 g/dL — ABNORMAL LOW (ref 3.5–5.0)
Alkaline Phosphatase: 71 U/L (ref 38–126)
Anion gap: 13 (ref 5–15)
BUN: 43 mg/dL — ABNORMAL HIGH (ref 8–23)
CO2: 22 mmol/L (ref 22–32)
Calcium: 10 mg/dL (ref 8.9–10.3)
Chloride: 105 mmol/L (ref 98–111)
Creatinine, Ser: 2.87 mg/dL — ABNORMAL HIGH (ref 0.61–1.24)
GFR, Estimated: 20 mL/min — ABNORMAL LOW (ref >60)
Glucose, Bld: 171 mg/dL — ABNORMAL HIGH (ref 70–99)
Potassium: 3.8 mmol/L (ref 3.5–5.1)
Sodium: 140 mmol/L (ref 135–145)
Total Bilirubin: 0.4 mg/dL (ref 0.0–1.2)
Total Protein: 7.2 g/dL (ref 6.5–8.1)

## 2023-12-31 LAB — CK: Total CK: 116 U/L (ref 49–397)

## 2023-12-31 MED ORDER — ONDANSETRON HCL 4 MG/2ML IJ SOLN: 4.0000 mg | Freq: Four times a day (QID) | INTRAMUSCULAR | Status: DC | PRN

## 2023-12-31 MED ORDER — DOCUSATE SODIUM 100 MG PO CAPS
100.0000 mg | ORAL_CAPSULE | Freq: Every day | ORAL | Status: DC
  Administered 2023-12-31: 100 mg via ORAL
  Filled 2023-12-31: qty 1

## 2023-12-31 MED ORDER — INSULIN ASPART 100 UNIT/ML IJ SOLN: 0.0000 [IU] | Freq: Every day | INTRAMUSCULAR | Status: DC

## 2023-12-31 MED ORDER — HYDRALAZINE HCL 50 MG PO TABS
50.0000 mg | ORAL_TABLET | Freq: Two times a day (BID) | ORAL | Status: DC
  Administered 2023-12-31 (×2): 50 mg via ORAL
  Filled 2023-12-31 (×2): qty 1

## 2023-12-31 MED ORDER — ONDANSETRON HCL 4 MG PO TABS: 4.0000 mg | ORAL_TABLET | Freq: Four times a day (QID) | ORAL | Status: DC | PRN

## 2023-12-31 MED ORDER — ACETAMINOPHEN 650 MG RE SUPP: 650.0000 mg | Freq: Four times a day (QID) | RECTAL | Status: DC | PRN

## 2023-12-31 MED ORDER — RIVAROXABAN 2.5 MG PO TABS: 2.5000 mg | ORAL_TABLET | Freq: Two times a day (BID) | ORAL | Status: DC

## 2023-12-31 MED ORDER — SODIUM BICARBONATE 650 MG PO TABS: 650.0000 mg | ORAL_TABLET | Freq: Three times a day (TID) | ORAL | Status: DC

## 2023-12-31 MED ORDER — SODIUM CHLORIDE 0.9% FLUSH
3.0000 mL | Freq: Two times a day (BID) | INTRAVENOUS | Status: DC
  Administered 2023-12-31: 3 mL via INTRAVENOUS

## 2023-12-31 MED ORDER — HYDRALAZINE HCL 20 MG/ML IJ SOLN
10.0000 mg | Freq: Four times a day (QID) | INTRAMUSCULAR | Status: DC | PRN
  Administered 2023-12-31: 10 mg via INTRAVENOUS
  Filled 2023-12-31: qty 1

## 2023-12-31 MED ORDER — SODIUM ZIRCONIUM CYCLOSILICATE 5 G PO PACK
5.0000 g | PACK | Freq: Once | ORAL | Status: AC
  Administered 2023-12-31: 5 g via ORAL
  Filled 2023-12-31 (×2): qty 1

## 2023-12-31 MED ORDER — HYDRALAZINE HCL 25 MG PO TABS: 50.0000 mg | ORAL_TABLET | Freq: Two times a day (BID) | ORAL | Status: DC

## 2023-12-31 MED ORDER — SODIUM BICARBONATE 650 MG PO TABS: 650.0000 mg | ORAL_TABLET | Freq: Every day | ORAL | Status: DC

## 2023-12-31 MED ORDER — INSULIN ASPART 100 UNIT/ML IJ SOLN
0.0000 [IU] | Freq: Three times a day (TID) | INTRAMUSCULAR | Status: DC
  Administered 2023-12-31 (×2): 1 [IU] via SUBCUTANEOUS

## 2023-12-31 MED ORDER — AMLODIPINE BESYLATE 10 MG PO TABS
10.0000 mg | ORAL_TABLET | Freq: Every day | ORAL | Status: DC
  Administered 2023-12-31: 10 mg via ORAL
  Filled 2023-12-31: qty 1

## 2023-12-31 MED ORDER — AMLODIPINE BESYLATE 5 MG PO TABS: 10.0000 mg | ORAL_TABLET | Freq: Every day | ORAL | Status: DC

## 2023-12-31 MED ORDER — SODIUM CHLORIDE 0.9% FLUSH: 3.0000 mL | INTRAVENOUS | Status: DC | PRN

## 2023-12-31 MED ORDER — ACETAMINOPHEN 325 MG PO TABS: 650.0000 mg | ORAL_TABLET | Freq: Four times a day (QID) | ORAL | Status: DC | PRN

## 2023-12-31 MED ORDER — SODIUM ZIRCONIUM CYCLOSILICATE 5 G PO PACK
5.0000 g | PACK | Freq: Once | ORAL | Status: AC
  Administered 2023-12-31: 5 g via ORAL
  Filled 2023-12-31: qty 1

## 2023-12-31 MED ORDER — POLYETHYLENE GLYCOL 3350 17 G PO PACK: 17.0000 g | PACK | Freq: Every day | ORAL | Status: DC | PRN

## 2023-12-31 MED ORDER — FERROUS SULFATE 325 (65 FE) MG PO TABS
325.0000 mg | ORAL_TABLET | Freq: Every day | ORAL | Status: DC
  Administered 2023-12-31: 325 mg via ORAL
  Filled 2023-12-31: qty 1

## 2023-12-31 MED ORDER — ASPIRIN 81 MG PO CHEW
81.0000 mg | CHEWABLE_TABLET | Freq: Every day | ORAL | Status: DC
  Administered 2023-12-31: 81 mg via ORAL
  Filled 2023-12-31: qty 1

## 2023-12-31 MED ORDER — TAMSULOSIN HCL 0.4 MG PO CAPS
0.4000 mg | ORAL_CAPSULE | Freq: Every day | ORAL | Status: DC
  Administered 2023-12-31: 0.4 mg via ORAL
  Filled 2023-12-31: qty 1

## 2023-12-31 MED ORDER — SODIUM CHLORIDE 0.9 % IV SOLN: 250.0000 mL | INTRAVENOUS | Status: DC | PRN

## 2023-12-31 MED ORDER — TAMSULOSIN HCL 0.4 MG PO CAPS: 0.4000 mg | ORAL_CAPSULE | Freq: Every day | ORAL | 0 refills | Status: DC

## 2023-12-31 MED ORDER — CALCIUM GLUCONATE-NACL 1-0.675 GM/50ML-% IV SOLN
1.0000 g | Freq: Once | INTRAVENOUS | Status: AC
  Administered 2023-12-31: 1000 mg via INTRAVENOUS
  Filled 2023-12-31: qty 50

## 2023-12-31 MED ORDER — FUROSEMIDE 10 MG/ML IJ SOLN
40.0000 mg | Freq: Once | INTRAMUSCULAR | Status: AC
  Administered 2023-12-31: 40 mg via INTRAVENOUS
  Filled 2023-12-31: qty 4

## 2023-12-31 MED ORDER — INSULIN ASPART 100 UNIT/ML IJ SOLN
2.0000 [IU] | Freq: Once | INTRAMUSCULAR | Status: AC
  Administered 2023-12-31: 2 [IU] via SUBCUTANEOUS

## 2023-12-31 NOTE — Care Management Obs Status (Signed)
MEDICARE OBSERVATION STATUS NOTIFICATION   Patient Details  Name: Jonathan Baird MRN: [id number] Date of Birth: 01-21-1933   Medicare Observation Status Notification Given:  Yes    Tom-Johnson, Hershal Coria, RN 12/31/2023, 2:58 PM

## 2023-12-31 NOTE — Progress Notes (Signed)
New Admission Note:   Arrival Method: Arrived from [facility] ED via stretcher Mental Orientation: Alert and oriented to person and place Telemetry: Box #22-NSR Assessment: Completed Skin: Intact IV: NSL -Rt hand Pain: 0/10 Tubes: External catheter( primofit) Safety Measures: Safety Fall Prevention Plan has been discussed.  Admission: Completed Orientation: Patient has been oriented to the room, unit and staff.  Family: None at bedside  Orders have been reviewed and implemented. Will continue to monitor the patient. Call light has been placed within reach and bed alarm has been activated.   Mylez Venable [employer], RN-BC Phone number: [phone]

## 2023-12-31 NOTE — ED Provider Notes (Signed)
[place] EMERGENCY DEPARTMENT AT [facility] Provider Note   CSN: [id number] Arrival date & time: 12/30/23  1639     History  Chief Complaint  Patient presents with   Urinary Retention  Level 5 caveat due to confusion  Jonathan Baird is a 89 y.o. male.  The history is provided by the patient and a relative.  Patient with a history of diabetes, carotid disease, on anticoagulation presents with confusion.  Family reports patient's had increasing confusion over the past week.  Patient denies any complaints and does not know why he is in the ER.  He denies fever/headache/chest pain/abdominal pain.  No abdominal pain or back pain.  He reports he feels well    Past Medical History:  Diagnosis Date   Carotid artery occlusion    Colon cancer (HCC) 10/12/2004   stage III   Diabetes mellitus     Home Medications Prior to Admission medications   Medication Sig Start Date End Date Taking? Authorizing Provider  amLODipine (NORVASC) 10 MG tablet Take 10 mg by mouth daily. 03/05/18   [provider]  aspirin (ASPIRIN 81) 81 MG chewable tablet 1 tablet    [provider]  ferrous sulfate 325 (65 FE) MG tablet Take 1 tablet (325 mg total) by mouth daily with breakfast. 10/26/23   Vann, Jessica U, DO  GVOKE HYPOPEN 2-PACK 1 MG/0.2ML SOAJ Inject 1 mg into the skin once as needed (for hypoglycemia). 02/08/22   [provider]  hydrALAZINE (APRESOLINE) 25 MG tablet Take 50 mg by mouth 2 (two) times daily with a meal.    [provider]  insulin lispro (HUMALOG KWIKPEN) 100 UNIT/ML KwikPen Inject 4 Units into the skin 3 (three) times daily after meals.    [provider]  lisinopril (PRINIVIL,ZESTRIL) 40 MG tablet Take 40 mg by mouth daily.    [provider]  rosuvastatin (CRESTOR) 10 MG tablet Take 10 mg by mouth daily.    [provider]  sodium bicarbonate 650 MG tablet Take 650 mg by mouth daily. 07/16/15   [provider]  XARELTO 2.5 MG TABS tablet TAKE 1 TABLET BY MOUTH TWICE A DAY. TAKE WITH ASPIRIN 81MG  DAILY. 03/04/18   [provider]      Allergies    Patient has no known allergies.    Review of Systems   Review of Systems  Unable to perform ROS: Mental status change    Physical Exam Updated Vital Signs BP (!) 188/62   Pulse 78   Temp 98.2 F (36.8 C) (Oral)   Resp 16   Ht 1.803 m (5\' 11" )   Wt 72.1 kg   SpO2 95%   BMI 22.18 kg/m  Physical Exam CONSTITUTIONAL: Elderly, no acute distress HEAD: Normocephalic/atraumatic EYES: EOMI ENMT: Mucous membranes moist NECK: supple no meningeal signs CV: S1/S2 noted, no murmurs/rubs/gallops noted LUNGS: Lungs are clear to auscultation bilaterally, no apparent distress ABDOMEN: soft, nontender, no rebound or guarding, bowel sounds noted throughout abdomen GU patient wearing diaper, no obvious inguinal hernia, no scrotal tenderness NEURO: Pt is awake/alert, but appears confused moves all extremitiesx4.  No facial droop.  No arm or leg drift EXTREMITIES: pulses normal/equal, full ROM, no deformities SKIN: warm, color normal  ED Results / Procedures / Treatments   Labs (all labs ordered are listed, but only abnormal results are displayed) Labs Reviewed  URINALYSIS, ROUTINE W REFLEX MICROSCOPIC - Abnormal; Notable for the following components:  Result Value   Glucose, UA 50 (*)    Hgb urine dipstick SMALL (*)    Protein, ur 100 (*)    Bacteria, UA RARE (*)    All other components within normal limits  CBC WITH DIFFERENTIAL/PLATELET - Abnormal; Notable for the following components:   RBC 3.73 (*)    Hemoglobin 9.7 (*)    HCT 29.7 (*)    MCV 79.6 (*)    All other components within normal limits  COMPREHENSIVE METABOLIC PANEL - Abnormal; Notable for the following components:   Potassium 5.2 (*)    CO2 19 (*)    Glucose, Bld 140 (*)    BUN 42 (*)    Creatinine, Ser 2.93 (*)    Albumin 3.4 (*)    GFR, Estimated  20 (*)    All other components within normal limits  CBG MONITORING, ED - Abnormal; Notable for the following components:   Glucose-Capillary 151 (*)    All other components within normal limits    EKG EKG Interpretation Date/Time:  Tuesday December 31 2023 00:26:34 EST Ventricular Rate:  61 PR Interval:  235 QRS Duration:  85 QT Interval:  451 QTC Calculation: 455 R Axis:   61  Text Interpretation: Sinus rhythm Prolonged PR interval Probable LVH with secondary repol abnrm Interpretation limited secondary to artifact Confirmed by Zadie Rhine ([id number]) on 12/31/2023 12:36:53 AM  Radiology CT Head Wo Contrast Result Date: 12/31/2023 CLINICAL DATA:  Mental status changes, unknown cause, dysuria and confusion. EXAM: CT HEAD WITHOUT CONTRAST TECHNIQUE: Contiguous axial images were obtained from the base of the skull through the vertex without intravenous contrast. RADIATION DOSE REDUCTION: This exam was performed according to the departmental dose-optimization program which includes automated exposure control, adjustment of the mA and/or kV according to patient size and/or use of iterative reconstruction technique. COMPARISON:  Head CT 10/20/2023 FINDINGS: Brain: There is moderate cerebral atrophy, moderate atrophic ventriculomegaly and moderate to severe small vessel disease of the cerebral white matter with mild cerebellar atrophy. There is a small chronic lacunar infarct in the superior right cerebellar hemisphere. There is dystrophic calcification in the frontal falx. No focal asymmetry concerning for a cortical based acute infarct, hemorrhage, mass or mass effect is seen. There is no midline shift. The basal cisterns are clear. Senescent mineralization both basal ganglia. Vascular: The carotid siphons are heavily calcified. There is patchy calcification in the distal vertebral arteries. No hyperdense central vessels. Skull: Negative for fractures or focal lesions. Sinuses/Orbits: No acute  finding. Old lens replacements are again noted. Clear sinuses and mastoids. Other: None. IMPRESSION: 1. No acute intracranial CT findings or interval changes. 2. Atrophy and small-vessel disease. 3. Carotid and vertebral artery atherosclerosis. Electronically Signed   By: Almira Bar M.D.   On: 12/31/2023 02:26   DG Chest Portable 1 View Result Date: 12/31/2023 CLINICAL DATA:  Weakness with discolored urine, initial encounter EXAM: PORTABLE CHEST 1 VIEW COMPARISON:  10/19/2023 FINDINGS: Cardiac shadow is stable. Mild central vascular congestion is noted. No focal infiltrate or effusion is seen. No bony abnormality is noted. IMPRESSION: Mild central vascular prominence without edema. Electronically Signed   By: Alcide Clever M.D.   On: 12/31/2023 00:19    Procedures Procedures    Medications Ordered in ED Medications  sodium zirconium cyclosilicate (LOKELMA) packet 5 g (5 g Oral Given 12/31/23 0112)  furosemide (LASIX) injection 40 mg (40 mg Intravenous Given 12/31/23 0113)    ED Course/ Medical Decision Making/ A&P Clinical Course as  of 12/31/23 0249  Mon Dec 30, 2023  2346 Potassium(!): 5.2 Mild hyperkalemia [DW]  2346 Creatinine(!): 2.93 Acute on chronic renal failure [DW]  2346 Hemoglobin(!): 9.7 Anemia [DW]  Tue Dec 31, 2023  0001 Discussed with his daughter [name] via the phone.  She reports for the past week he has had increasing confusion and sluggishness.  She reports he has had no major falls, no fevers or vomiting.  No focal weakness.  She is concerned about a UTI.  He has had this previously.  No new medications. [DW]  0115 Patient had about 900 mL of urine in his bladder.  No obvious UTI.  X-ray is negative.  Will obtain CT head for his new onset confusion over the past week.  Then will likely be admitted [DW]  0248 CT head negative.  No signs of UTI.  However patient is still not at his baseline for over a week. He has had difficulty walking around.  Will need to be admitted  for further workup of altered mental status of unclear etiology.  Patient was treated with Lasix and Lokelma for his mild hyperkalemia [DW]  0248 Discussed with Dr. Janalyn Shy for admission  [DW]    Clinical Course User Index [DW] Zadie Rhine, MD                                 Medical Decision Making Amount and/or Complexity of Data Reviewed Labs: ordered. Decision-making details documented in ED Course. Radiology: ordered. ECG/medicine tests: ordered.  Risk Prescription drug management. Decision regarding hospitalization.   This patient presents to the ED for concern of weakness, this involves an extensive number of treatment options, and is a complaint that carries with it a high risk of complications and morbidity.  The differential diagnosis includes but is not limited to CVA, intracranial hemorrhage, acute coronary syndrome, renal failure, urinary tract infection, electrolyte disturbance, pneumonia   Comorbidities that complicate the patient evaluation: Patient's presentation is complicated by their history of chronic kidney disease  Social Determinants of Health: Patient's  confusion and poor mobility   increases the complexity of managing their presentation  Additional history obtained: Additional history obtained from family Records reviewed previous admission documents  Lab Tests: I Ordered, and personally interpreted labs.  The pertinent results include: Chronic kidney disease, mild hyperkalemia  Imaging Studies ordered: I ordered imaging studies including X-ray chest   I independently visualized and interpreted imaging which showed vascular congestion I agree with the radiologist interpretation  Cardiac Monitoring: The patient was maintained on a cardiac monitor.  I personally viewed and interpreted the cardiac monitor which showed an underlying rhythm of:  sinus rhythm   Critical Interventions:   admission for altered mental status of unclear  etiology  Consultations Obtained: I requested consultation with the admitting physician [facility] , and discussed  findings as well as pertinent plan - they recommend: Admit  Reevaluation: After the interventions noted above, I reevaluated the patient and found that they have :stayed the same  Complexity of problems addressed: Patient's presentation is most consistent with  acute presentation with potential threat to life or bodily function  Disposition: After consideration of the diagnostic results and the patient's response to treatment,  I feel that the patent would benefit from admission   .           Final Clinical Impression(s) / ED Diagnoses Final diagnoses:  Delirium  AKI (acute kidney injury) (  [facility])    Rx / DC Orders ED Discharge Orders     None         Zadie Rhine, MD 12/31/23 [phone]

## 2023-12-31 NOTE — H&P (Signed)
History and Physical    Jonathan Baird:[id number] DOB: Jan 25, 1933 DOA: 12/30/2023  PCP: Georgianne Fick, MD   Patient coming from: Home   Chief Complaint:  Chief Complaint  Patient presents with   Urinary Retention   ED TRIAGE note:  Patient family reports patient has had brown urine with foul odor.  Went to PCP but bc he couldn't urinate sent him here.  Patient denies pain, denies fever chills.  Family reports some confusion.       HPI:  Jonathan Baird is a 89 y.o. male with medical history significant of history of dementia, essential hypertension, hyperlipidemia, insulin-dependent DM type II, CKD stage IV, peripheral vascular disease, right internal carotid artery stenosis on Xarelto, history of colon cancer status post hemicolectomy and on chemotherapy in 2005 and chronic iron deficiency anemia patient has been brought to ED for increasing confusion over the past week. Patient ported that he has some urinary hesitancy but there is no urgency and increased urinary frequency.  Denies any fever, chill, and lower abdominal pain. Patient denies any fever, headache, chest pain, shortness of breath, palpitation, abdominal pain, nausea, vomiting, constipation diarrhea.  Dr. Bebe Shaggy reported that family stating that patient patient has generalized weakness and confusion ongoing for 2 weeks and family was concerned that he might have some UTI.  At the baseline he ambulates with walker but for last 1 week he is not doing so.  Patient did not have any fall or head trauma.   ED Course:  At presentation to ED patient found to elevated blood pressure 176/69 otherwise hemodynamically stable.  UA showed blood glucose 50, dipstick hemoglobin positive, 100 protein, rare bacteria.  No leukocyte esterase or nitrate. CBC unremarkable stable H&H. BMP showing elevated potassium 5.2 low bicarb 19, creatinine 1.93 which is baseline, GFR 20 which is baseline, low albumin 3.4.  EKG showing  normal sinus rhythm heart rate 61.  CT head no acute intracranial abnormality.  Atrophy and small vessel disease.  Cavity and vertebral artery atherosclerosis.  Chest x-ray mild central vascular prominence without edema.   Patient's UA did not show any evidence of UTI.  Also he is asymptomatic.  Hhospitalist has been contacted for further evaluation management of worsening generalized weakness.  Significant labs in the ED: Lab Orders         Urinalysis, Routine w reflex microscopic -Urine, Clean Catch         CBC with Differential         Comprehensive metabolic panel         CK         Comprehensive metabolic panel         CBC         POC CBG, ED       Review of Systems:  Review of Systems  Constitutional:  Negative for chills, fever, malaise/fatigue and weight loss.  HENT:  Negative for hearing loss.   Eyes:  Negative for blurred vision.  Respiratory:  Negative for cough, sputum production and shortness of breath.   Cardiovascular:  Negative for chest pain and palpitations.  Gastrointestinal:  Negative for abdominal pain, constipation, diarrhea, heartburn, nausea and vomiting.  Genitourinary:  Negative for dysuria, frequency and urgency.       Urinary hesitancy  Musculoskeletal:  Negative for back pain, falls, joint pain, myalgias and neck pain.  Neurological:  Negative for dizziness, tingling, tremors, sensory change, speech change, focal weakness, seizures, loss of consciousness, weakness and headaches.  Psychiatric/Behavioral:  The patient is not nervous/anxious.   All other systems reviewed and are negative.   Past Medical History:  Diagnosis Date   Carotid artery occlusion    Colon cancer (HCC) 10/12/2004   stage III   Diabetes mellitus     Past Surgical History:  Procedure Laterality Date   hemocolectomy  2005     reports that he has never smoked. He has never used smokeless tobacco. He reports that he does not drink alcohol and does not use drugs.  No  Known Allergies  Family History  Problem Relation Age of Onset   Cancer Mother    Diabetes Father    Cancer Sister     Prior to Admission medications   Medication Sig Start Date End Date Taking? Authorizing Provider  amLODipine (NORVASC) 10 MG tablet Take 10 mg by mouth daily. 03/05/18   [provider]  aspirin (ASPIRIN 81) 81 MG chewable tablet 1 tablet    [provider]  ferrous sulfate 325 (65 FE) MG tablet Take 1 tablet (325 mg total) by mouth daily with breakfast. 10/26/23   Vann, Jessica U, DO  GVOKE HYPOPEN 2-PACK 1 MG/0.2ML SOAJ Inject 1 mg into the skin once as needed (for hypoglycemia). 02/08/22   [provider]  hydrALAZINE (APRESOLINE) 25 MG tablet Take 50 mg by mouth 2 (two) times daily with a meal.    [provider]  insulin lispro (HUMALOG KWIKPEN) 100 UNIT/ML KwikPen Inject 4 Units into the skin 3 (three) times daily after meals.    [provider]  lisinopril (PRINIVIL,ZESTRIL) 40 MG tablet Take 40 mg by mouth daily.    [provider]  rosuvastatin (CRESTOR) 10 MG tablet Take 10 mg by mouth daily.    [provider]  sodium bicarbonate 650 MG tablet Take 650 mg by mouth daily. 07/16/15   [provider]  XARELTO 2.5 MG TABS tablet TAKE 1 TABLET BY MOUTH TWICE A DAY. TAKE WITH ASPIRIN 81MG  DAILY. 03/04/18   [provider]     Physical Exam: Vitals:   12/30/23 1918 12/30/23 2340 12/31/23 0104 12/31/23 0201  BP: (!) 149/49 (!) 194/90  (!) 188/62  Pulse: 63 (!) 58  78  Resp: 16 14  16   Temp: 98.5 F (36.9 C)  98.2 F (36.8 C)   TempSrc: Oral  Oral   SpO2: 100% 100%  95%  Weight:      Height:        Physical Exam Vitals and nursing note reviewed.  Constitutional:      Appearance: He is not ill-appearing.  HENT:     Nose: Nose normal.  Eyes:     Pupils: Pupils are equal, round, and reactive to light.  Cardiovascular:     Rate and Rhythm: Normal rate and regular rhythm.      Pulses: Normal pulses.     Heart sounds: Normal heart sounds.  Pulmonary:     Effort: Pulmonary effort is normal.     Breath sounds: Normal breath sounds.  Abdominal:     General: Bowel sounds are normal. There is no distension.     Tenderness: There is no abdominal tenderness.  Musculoskeletal:        General: No swelling, deformity or signs of injury.     Cervical back: Neck supple.     Right lower leg: No edema.     Left lower leg: No edema.  Skin:    Capillary Refill: Capillary refill takes less than 2  seconds.  Neurological:     General: No focal deficit present.     Mental Status: He is alert.     Cranial Nerves: No cranial nerve deficit.     Sensory: No sensory deficit.     Motor: No weakness.     Deep Tendon Reflexes: Reflexes normal.     Comments: Patient is alert oriented to name. History of dementia.  Patient is able to recall his daughter's name.  Psychiatric:        Mood and Affect: Mood normal.        Behavior: Behavior normal.        Thought Content: Thought content normal.      Labs on Admission: I have personally reviewed following labs and imaging studies  CBC: Recent Labs  Lab 12/30/23 1755  WBC 8.9  NEUTROABS 6.5  HGB 9.7*  HCT 29.7*  MCV 79.6*  PLT 240   Basic Metabolic Panel: Recent Labs  Lab 12/30/23 1713  NA 138  K 5.2*  CL 108  CO2 19*  GLUCOSE 140*  BUN 42*  CREATININE 2.93*  CALCIUM 9.1   GFR: Estimated Creatinine Clearance: 17.8 mL/min (A) (by C-G formula based on SCr of 2.93 mg/dL (H)). Liver Function Tests: Recent Labs  Lab 12/30/23 1713  AST 21  ALT 18  ALKPHOS 75  BILITOT 0.5  PROT 7.4  ALBUMIN 3.4*   No results for input(s): "LIPASE", "AMYLASE" in the last 168 hours. No results for input(s): "AMMONIA" in the last 168 hours. Coagulation Profile: No results for input(s): "INR", "PROTIME" in the last 168 hours. Cardiac Enzymes: No results for input(s): "CKTOTAL", "CKMB", "CKMBINDEX", "TROPONINI", "TROPONINIHS" in  the last 168 hours. BNP (last 3 results) No results for input(s): "BNP" in the last 8760 hours. HbA1C: No results for input(s): "HGBA1C" in the last 72 hours. CBG: Recent Labs  Lab 12/30/23 1656  GLUCAP 151*   Lipid Profile: No results for input(s): "CHOL", "HDL", "LDLCALC", "TRIG", "CHOLHDL", "LDLDIRECT" in the last 72 hours. Thyroid Function Tests: No results for input(s): "TSH", "T4TOTAL", "FREET4", "T3FREE", "THYROIDAB" in the last 72 hours. Anemia Panel: No results for input(s): "VITAMINB12", "FOLATE", "FERRITIN", "TIBC", "IRON", "RETICCTPCT" in the last 72 hours. Urine analysis:    Component Value Date/Time   COLORURINE YELLOW 12/31/2023 0040   APPEARANCEUR CLEAR 12/31/2023 0040   LABSPEC 1.012 12/31/2023 0040   PHURINE 5.0 12/31/2023 0040   GLUCOSEU 50 (A) 12/31/2023 0040   HGBUR SMALL (A) 12/31/2023 0040   BILIRUBINUR NEGATIVE 12/31/2023 0040   KETONESUR NEGATIVE 12/31/2023 0040   PROTEINUR 100 (A) 12/31/2023 0040   NITRITE NEGATIVE 12/31/2023 0040   LEUKOCYTESUR NEGATIVE 12/31/2023 0040    Radiological Exams on Admission: I have personally reviewed images CT Head Wo Contrast Result Date: 12/31/2023 CLINICAL DATA:  Mental status changes, unknown cause, dysuria and confusion. EXAM: CT HEAD WITHOUT CONTRAST TECHNIQUE: Contiguous axial images were obtained from the base of the skull through the vertex without intravenous contrast. RADIATION DOSE REDUCTION: This exam was performed according to the departmental dose-optimization program which includes automated exposure control, adjustment of the mA and/or kV according to patient size and/or use of iterative reconstruction technique. COMPARISON:  Head CT 10/20/2023 FINDINGS: Brain: There is moderate cerebral atrophy, moderate atrophic ventriculomegaly and moderate to severe small vessel disease of the cerebral white matter with mild cerebellar atrophy. There is a small chronic lacunar infarct in the superior right cerebellar  hemisphere. There is dystrophic calcification in the frontal falx. No focal asymmetry concerning  for a cortical based acute infarct, hemorrhage, mass or mass effect is seen. There is no midline shift. The basal cisterns are clear. Senescent mineralization both basal ganglia. Vascular: The carotid siphons are heavily calcified. There is patchy calcification in the distal vertebral arteries. No hyperdense central vessels. Skull: Negative for fractures or focal lesions. Sinuses/Orbits: No acute finding. Old lens replacements are again noted. Clear sinuses and mastoids. Other: None. IMPRESSION: 1. No acute intracranial CT findings or interval changes. 2. Atrophy and small-vessel disease. 3. Carotid and vertebral artery atherosclerosis. Electronically Signed   By: Almira Bar M.D.   On: 12/31/2023 02:26   DG Chest Portable 1 View Result Date: 12/31/2023 CLINICAL DATA:  Weakness with discolored urine, initial encounter EXAM: PORTABLE CHEST 1 VIEW COMPARISON:  10/19/2023 FINDINGS: Cardiac shadow is stable. Mild central vascular congestion is noted. No focal infiltrate or effusion is seen. No bony abnormality is noted. IMPRESSION: Mild central vascular prominence without edema. Electronically Signed   By: Alcide Clever M.D.   On: 12/31/2023 00:19     EKG: My personal interpretation of EKG shows: Sinus rhythm heart rate 61.  Prolonged PR interval.   Assessment/Plan: Principal Problem:   Generalized weakness Active Problems:   Essential hypertension   Hyperlipidemia   PVD (peripheral vascular disease) (HCC)   Insulin dependent type 2 diabetes mellitus (HCC)   History of dementia   CKD (chronic kidney disease), stage IV (HCC)   History of carotid artery stenosis   History of colon cancer status post colectomy   Hyperkalemia   Urinary hesitancy    Assessment and Plan: Generalized weakness > Patient was brought to ED via EMS with multiple complaints including generalized weakness for last 1 week,  unable to emptying his bladder, increased urinary hesitancy.  Daughter reported that for last 2 weeks patient having the symptoms and she is concerned that patient might have some UTI.  At baseline patient uses walker at home but he is using the walker for last 1 week. - Presentation to ED patient found to elevated blood pressure otherwise hemodynamically stable.  Patient is alert oriented to place and able to recall his daughter's name.  Have good short-term memory. - CMP showing elevated potassium 5.2, low bicarb 18, creatinine 2.93.  BUN 42 which is also at baseline.  (Baseline creatinine around 2.8-3.1) and GFR at baseline.  CBC showing stable H&H 9.7 and 29. -Chest x-ray showed central vascular prominence without any edema which is why in the ED patient has been given Lasix 40 mg IV. - Physical exam no evidence of volume overload. - CT head no acute intracranial abnormality. -Physical exam did not showed any upper and lower extremity weakness, no focal and generalized weakness.  No sensory change, and tingling tingling.  Has bilateral upper extremity tremor. - Given patient has more generalized weakness which could be underlying dementia versus uremia from CKD stage IV.  However as patient has history of carotid artery stenosis and peripheral vascular disease checking MRI to rule out any evidence of stroke. -Checking CK level.  Holding Lipitor until then. -Continue fall precaution. - Consulting inpatient PT and OT for evaluation.   CKD stage IV Nonanion gap metabolic acidosis - CMP showing elevated potassium 5.2, low bicarb 18, creatinine 2.93.  (Baseline creatinine around 2.8-3.1). -Renal function at baseline. - Changing sodium bicarb twice daily to 3 times daily. - Holding lisinopril as patient renal function has been significantly progressed and currently having hyperkalemia.  Continuing amlodipine and hydralazine. -Monitor renal function,  avoid nephrotoxic agent.  Increased urinary  hesitancy -Patient reported increased urinary hesitancy.  Denies any increased urinary urgency, fever, chill, and dysuria. -UA no evidence of UTI. - Concern for BPH with advanced age in male patient. - Starting oral Flomax 0.5 mg daily. - In the ED male PureWick has been placed and having good urine output almost 1 L.  Hyperkalemia -Elevated potassium 5.2 - Treating with calcium gluconate, insulin 2 unit and Lokelma 10 g.  Chronic microcytic anemia Anemia of chronic disease secondary to CKD stage IV -Stable H&H 9.7 and 29.  Low MCV 79. - Continue oral iron supplement.  Essential hypertension -Found to have elevated blood pressure.  Patient has missed dose of amlodipine and hydralazine today. - Presuming amlodipine 10 mg, hydralazine 50 mg twice daily.  -If renal function remains stable in next 24 hours can resume lisinopril.  Hyperlipidemia -Holding Lipitor until CK rules out any rhabdomyolysis.  Peripheral vascular disease History of calculatedly stenosis -Continue aspirin 81 mg daily, Xarelto 2.5 mg twice daily. - Holding Lipitor until CK rules out any rhabdomyolysis.  Insulin-dependent DM type II -Continue low sliding scale SSI and at bedtime insulin as needed coverage.  History of colon cancer s/p hemicolectomy in 2005 -Patient follows Dr. Pamelia Hoit.  Last seen in 2015.  Patient supposed to be follow-up with oncology every year to the clinic for the follow-up including CEA. -On discharge today to make sure that PCP arrange follow-up with oncology for annual visit.  History of dementia - At home patient is not any medications.  Currently patient is alert and oriented to name, place and able to recall patient's wife name.   DVT prophylaxis:  Xarelto Code Status:  Full Code Diet: Renal carb and modified diet Family Communication:   No one present at bedside Disposition Plan: Pending PT and OT evaluation.  Pending result of the MRI and CK level.  If all workup negative  patient can be discharged home possibly with home health with home PT and OT. Consults: PT and OT Admission status:   Observation, Telemetry bed  Severity of Illness: The appropriate patient status for this patient is OBSERVATION. Observation status is judged to be reasonable and necessary in order to provide the required intensity of service to ensure the patient's safety. The patient's presenting symptoms, physical exam findings, and initial radiographic and laboratory data in the context of their medical condition is felt to place them at decreased risk for further clinical deterioration. Furthermore, it is anticipated that the patient will be medically stable for discharge from the hospital within 2 midnights of admission.     Tereasa Coop, MD [facility]  How to contact the [facility] Attending or Consulting provider 7A - 7P or covering provider during after hours 7P -7A, for this patient.  Check the care team in [facility] and look for a) attending/consulting [facility] provider listed and b) the [facility] team listed Log into [email or web address] and use [place]'s universal password to access. If you do not have the password, please contact the hospital operator. Locate the [facility] provider you are looking for under [facility] and page to a number that you can be directly reached. If you still have difficulty reaching the provider, please page the [facility] (Director on Call) for the Hospitalists listed on amion for assistance.  12/31/2023, 3:23 AM

## 2023-12-31 NOTE — Progress Notes (Signed)
Patient's BP 192/72 HR 73, patient asymptomatic, Sundil,MD made aware. Order received to give IV hydralazine in addition to previously hydralazine p.o given. Will continue to monitor

## 2023-12-31 NOTE — Plan of Care (Signed)
  Problem: Clinical Measurements: Goal: Respiratory complications will improve Outcome: Progressing   Problem: Pain Managment: Goal: General experience of comfort will improve and/or be controlled Outcome: Progressing   Problem: Skin Integrity: Goal: Risk for impaired skin integrity will decrease Outcome: Progressing

## 2023-12-31 NOTE — Progress Notes (Addendum)
Patient with Hx of Dementia, unable to sign Observation letter. CM contacted patient's wife who referred CM to daughter, [name]. [name] gave CM verbal consent to sign letter.

## 2023-12-31 NOTE — Evaluation (Signed)
Physical Therapy Evaluation and Discharge from Acute PT Patient Details Name: Jonathan Baird MRN: [id number] DOB: 07/30/1933 Today's Date: 12/31/2023  History of Present Illness  Jonathan Baird is a 89 y.o. male who presented to ED with confusion increasing over the past week and urinary hesitancy, concern for UTI. CT reveals atrophy and small-vessel disease, carotid and vertebral artery atherosclerosis. PMHx includes carotid artery occlusion, colon cancer stage III, dementia, HTN, HLD, and insulin-dependent DMII, CKD stage I, peripheral vascular disease.  Clinical Impression   Patient evaluated by Physical Therapy with no further acute PT needs identified, as pt is to dc back home today; In considering options for discharge, I value going back to familiar environment, caregivers, and routines for patients with dementia.  REc HHPT/OT/RN follow up;   PT is signing off. Thank you for this referral.         If plan is discharge home, recommend the following: A little help with walking and/or transfers;A little help with bathing/dressing/bathroom;Assistance with cooking/housework;Help with stairs or ramp for entrance;Supervision due to cognitive status   Can travel by private vehicle        Equipment Recommendations None recommended by PT (per daughter they are well-equipped)  Recommendations for Other Services       Functional Status Assessment Patient has had a recent decline in their functional status and demonstrates the ability to make significant improvements in function in a reasonable and predictable amount of time.     Precautions / Restrictions Precautions Precautions: Fall Restrictions Weight Bearing Restrictions Per Provider Order: No      Mobility  Bed Mobility                    Transfers Overall transfer level: Needs assistance Equipment used: Rolling walker (2 wheels) Transfers: Sit to/from Stand Sit to Stand: Mod assist           General  transfer comment: Mod assist to power up from low commode; heavy posterior bias initially, needing mod assist to support and prevent fall until able to organize center over his feet    Ambulation/Gait Ambulation/Gait assistance: Min assist, Mod assist Gait Distance (Feet): 25 Feet Assistive device: Rolling walker (2 wheels) Gait Pattern/deviations: Step-through pattern, Decreased step length - right, Decreased step length - left, Trunk flexed       General Gait Details: Max cues for path, finding the sink to wash hands; min assist for safetya dn up to mod assist for rW management  Stairs            Wheelchair Mobility     Tilt Bed    Modified Rankin (Stroke Patients Only)       Balance Overall balance assessment: Needs assistance Sitting-balance support: Feet supported Sitting balance-Leahy Scale: Fair     Standing balance support: Bilateral upper extremity supported, During functional activity Standing balance-Leahy Scale: Poor                               Pertinent Vitals/Pain Pain Assessment Faces Pain Scale: No hurt    Home Living Family/patient expects to be discharged to:: Private residence Living Arrangements: Spouse/significant other Available Help at Discharge: Family;Available 24 hours/day Type of Home: House Home Access: Stairs to enter   [employer] of Steps: 2-3   Home Layout: One level Home Equipment: Shower seat;Cane - single Librarian, academic (2 wheels) Additional Comments: questionable historian. Reports SPC use in the home.Contacted TOC,  who related that pt's daughter states they have all needed DME    Prior Function Prior Level of Function : Needs assist             Mobility Comments: SPC in the home and "a few" recent falls ADLs Comments: mod I, reports he does not have assist for BADLs     Extremity/Trunk Assessment   Upper Extremity Assessment Upper Extremity Assessment: Defer to OT evaluation     Lower Extremity Assessment Lower Extremity Assessment: Generalized weakness    Cervical / Trunk Assessment Cervical / Trunk Assessment: Kyphotic  Communication   Communication Communication: Impaired Factors Affecting Communication: Hearing impaired    Cognition Arousal: Alert Behavior During Therapy: Flat affect                             Following commands: Intact       Cueing       General Comments General comments (skin integrity, edema, etc.): NAD on RA    Exercises     Assessment/Plan    PT Assessment All further PT needs can be met in the next venue of care  PT Problem List Decreased strength;Decreased activity tolerance;Decreased balance;Decreased mobility;Decreased coordination;Decreased cognition;Decreased knowledge of use of DME;Decreased safety awareness;Decreased knowledge of precautions       PT Treatment Interventions      PT Goals (Current goals can be found in the Care Plan section)  Acute Rehab PT Goals Patient Stated Goal: Did not state PT Goal Formulation: All assessment and education complete, DC therapy (Noting DC summary and orders; discussed case with TOC)    Frequency       Co-evaluation               AM-PAC PT "6 Clicks" Mobility  Outcome Measure Help needed turning from your back to your side while in a flat bed without using bedrails?: A Lot Help needed moving from lying on your back to sitting on the side of a flat bed without using bedrails?: A Lot Help needed moving to and from a bed to a chair (including a wheelchair)?: A Little Help needed standing up from a chair using your arms (e.g., wheelchair or bedside chair)?: A Little Help needed to walk in hospital room?: A Little Help needed climbing 3-5 steps with a railing? : A Little 6 Click Score: 16    End of Session Equipment Utilized During Treatment: Gait belt Activity Tolerance: Patient tolerated treatment well Patient left: in chair;with call  bell/phone within reach;with chair alarm set Nurse Communication: Mobility status PT Visit Diagnosis: Unsteadiness on feet (R26.81);Muscle weakness (generalized) (M62.81)    Time: 9604-5409 PT Time Calculation (min) (ACUTE ONLY): 19 min   Charges:   PT Evaluation $PT Eval Moderate Complexity: 1 Mod   PT General Charges $$ ACUTE PT VISIT: 1 Visit         Van Clines, PT  Acute Rehabilitation Services Office [phone] Secure Chat welcomed   Levi Aland 12/31/2023, 1:59 PM

## 2023-12-31 NOTE — Evaluation (Signed)
Occupational Therapy Evaluation Patient Details Name: Jonathan Baird MRN: [id number] DOB: 02-10-1933 Today's Date: 12/31/2023   History of Present Illness   Jonathan Baird is a 89 y.o. male who presented to ED with confusion increasing over the past week and urinary hesitancy, concern for UTI. CT reveals atrophy and small-vessel disease, carotid and vertebral artery atherosclerosis. PMHx includes carotid artery occlusion, colon cancer stage III, dementia, HTN, HLD, and insulin-dependent DMII, CKD stage I, peripheral vascular disease.     Clinical Impressions Jonathan Baird was evaluated s/p the above admission list. He lives with his wife, uses a SPC in the home and is mod I for ADLs at baseline (per report of patient, questionable historian). Upon evaluation the pt was limited by generalized weakness, baseline dementia, unsteady balance and limited activity tolerance. Overall he needed min A foe bed mobility and to stand from the EOB, he mobilized within the room with CGA and RW. Due to the deficits listed below the pt also needs up to mod A for LB ADLs and superivsion A for UB ADLs in sitting with cues. Pt will benefit from continued acute OT services and HHOT and support from family.      If plan is discharge home, recommend the following:   A little help with walking and/or transfers;A little help with bathing/dressing/bathroom;Assistance with cooking/housework;Direct supervision/assist for medications management;Direct supervision/assist for financial management;Assist for transportation;Supervision due to cognitive status     Functional Status Assessment   Patient has had a recent decline in their functional status and demonstrates the ability to make significant improvements in function in a reasonable and predictable amount of time.     Equipment Recommendations   None recommended by OT      Precautions/Restrictions   Precautions Precautions: Fall Restrictions Weight  Bearing Restrictions Per Provider Order: No     Mobility Bed Mobility Overal bed mobility: Needs Assistance Bed Mobility: Supine to Sit     Supine to sit: Min assist     General bed mobility comments: benefits from increased time and cues    Transfers Overall transfer level: Needs assistance Equipment used: Rolling walker (2 wheels) Transfers: Sit to/from Stand Sit to Stand: Min assist                  Balance Overall balance assessment: Needs assistance Sitting-balance support: Feet supported Sitting balance-Leahy Scale: Fair     Standing balance support: Bilateral upper extremity supported, During functional activity Standing balance-Leahy Scale: Poor                             ADL either performed or assessed with clinical judgement   ADL Overall ADL's : Needs assistance/impaired Eating/Feeding: Independent;Sitting   Grooming: Set up;Sitting   Upper Body Bathing: Supervision/ safety;Sitting   Lower Body Bathing: Sit to/from stand;Moderate assistance   Upper Body Dressing : Supervision/safety;Sitting   Lower Body Dressing: Moderate assistance;Sit to/from stand   Toilet Transfer: Minimal assistance;Ambulation;Rolling walker (2 wheels)   Toileting- Clothing Manipulation and Hygiene: Supervision/safety;Sitting/lateral lean       Functional mobility during ADLs: Minimal assistance;Rolling walker (2 wheels) General ADL Comments: cues for baseline dementia provided throughout, pt benefits from increased time and cues for all tasks     Vision Baseline Vision/History: 0 No visual deficits Vision Assessment?: No apparent visual deficits     Perception Perception: Not tested       Praxis Praxis: Not tested  Pertinent Vitals/Pain Pain Assessment Pain Assessment: Faces Faces Pain Scale: No hurt     Extremity/Trunk Assessment Upper Extremity Assessment Upper Extremity Assessment: Generalized weakness   Lower Extremity  Assessment Lower Extremity Assessment: Defer to PT evaluation   Cervical / Trunk Assessment Cervical / Trunk Assessment: Kyphotic   Communication Communication Communication: Impaired Factors Affecting Communication: Hearing impaired   Cognition Arousal: Alert Behavior During Therapy: Flat affect Cognition: History of cognitive impairments             OT - Cognition Comments: dementia at baseline, follows all simple 12 step commands. Had difficulty with PLOF and home set up questions.                 Following commands: Intact                  Home Living Family/patient expects to be discharged to:: Private residence Living Arrangements: Spouse/significant other Available Help at Discharge: Family;Available 24 hours/day Type of Home: House Home Access: Stairs to enter [employer] of Steps: 2-3   Home Layout: One level     Bathroom Shower/Tub: Walk-in shower         Home Equipment: Shower seat;Cane - single Librarian, academic (2 wheels)   Additional Comments: questionable historian. Reports SPC use in the home.      Prior Functioning/Environment Prior Level of Function : Needs assist             Mobility Comments: SPC in the home and "a few" recent falls ADLs Comments: mod I, reports he does not have assist for BADLs    OT Problem List: Decreased range of motion;Decreased activity tolerance;Impaired balance (sitting and/or standing);Decreased safety awareness;Decreased cognition   OT Treatment/Interventions: Self-care/ADL training;Therapeutic exercise;DME and/or AE instruction;Therapeutic activities      OT Goals(Current goals can be found in the care plan section)   Acute Rehab OT Goals Patient Stated Goal: to go home OT Goal Formulation: With patient Time For Goal Achievement: 01/14/24 Potential to Achieve Goals: Good ADL Goals Pt Will Perform Grooming: with supervision;standing Pt Will Perform Lower Body Dressing:  with supervision;sit to/from stand Pt Will Transfer to Toilet: with supervision;ambulating;bedside commode Additional ADL Goal #1: Pt will tolerate at least 5 minutes of standing activity to demonstrated improved enduracne for ADLs at home   OT Frequency:  Min 1X/week       AM-PAC OT "6 Clicks" Daily Activity     Outcome Measure Help from another person eating meals?: None Help from another person taking care of personal grooming?: A Little Help from another person toileting, which includes using toliet, bedpan, or urinal?: A Little Help from another person bathing (including washing, rinsing, drying)?: A Lot Help from another person to put on and taking off regular upper body clothing?: A Little Help from another person to put on and taking off regular lower body clothing?: A Lot 6 Click Score: 17   End of Session Equipment Utilized During Treatment: Rolling walker (2 wheels);Gait belt Nurse Communication: Mobility status  Activity Tolerance: Patient tolerated treatment well Patient left: in chair;with call bell/phone within reach  OT Visit Diagnosis: Unsteadiness on feet (R26.81);Other abnormalities of gait and mobility (R26.89);Muscle weakness (generalized) (M62.81);History of falling (Z91.81)                Time: 1610-9604 OT Time Calculation (min): 19 min Charges:  OT General Charges $OT Visit: 1 Visit OT Evaluation $OT Eval Moderate Complexity: 1 Mod  Derenda Mis, OTR/L Acute Rehabilitation Services  Office [phone] Secure Chat Communication Preferred   Donia Pounds 12/31/2023, 9:35 AM

## 2023-12-31 NOTE — ED Provider Notes (Incomplete)
[place] EMERGENCY DEPARTMENT AT [facility] Provider Note   CSN: [id number] Arrival date & time: 12/30/23  1639     History {Add pertinent medical, surgical, social history, OB history to HPI:1} Chief Complaint  Patient presents with  . Urinary Retention  Level 5 caveat due to confusion  Jonathan Baird is a 89 y.o. male.  The history is provided by the patient and a relative.  Patient with a history of diabetes, carotid disease, on anticoagulation presents with confusion.  Family reports patient's had increasing confusion over the past week.  Patient denies any complaints and does not know why he is in the ER.  He denies fever/headache/chest pain/abdominal pain.  No abdominal pain or back pain.  He reports he feels well    Past Medical History:  Diagnosis Date  . Carotid artery occlusion   . Colon cancer (HCC) 10/12/2004   stage III  . Diabetes mellitus     Home Medications Prior to Admission medications   Medication Sig Start Date End Date Taking? Authorizing Provider  amLODipine (NORVASC) 10 MG tablet Take 10 mg by mouth daily. 03/05/18   [provider]  aspirin (ASPIRIN 81) 81 MG chewable tablet 1 tablet    [provider]  ferrous sulfate 325 (65 FE) MG tablet Take 1 tablet (325 mg total) by mouth daily with breakfast. 10/26/23   Vann, Jessica U, DO  GVOKE HYPOPEN 2-PACK 1 MG/0.2ML SOAJ Inject 1 mg into the skin once as needed (for hypoglycemia). 02/08/22   [provider]  hydrALAZINE (APRESOLINE) 25 MG tablet Take 50 mg by mouth 2 (two) times daily with a meal.    [provider]  insulin lispro (HUMALOG KWIKPEN) 100 UNIT/ML KwikPen Inject 4 Units into the skin 3 (three) times daily after meals.    [provider]  lisinopril (PRINIVIL,ZESTRIL) 40 MG tablet Take 40 mg by mouth daily.    [provider]  rosuvastatin (CRESTOR) 10 MG tablet Take 10 mg by mouth daily.    [provider]  sodium  bicarbonate 650 MG tablet Take 650 mg by mouth daily. 07/16/15   [provider]  XARELTO 2.5 MG TABS tablet TAKE 1 TABLET BY MOUTH TWICE A DAY. TAKE WITH ASPIRIN 81MG  DAILY. 03/04/18   [provider]      Allergies    Patient has no known allergies.    Review of Systems   Review of Systems  Unable to perform ROS: Mental status change    Physical Exam Updated Vital Signs BP (!) 194/90   Pulse (!) 58   Temp 98.5 F (36.9 C) (Oral)   Resp 14   Ht 1.803 m (5\' 11" )   Wt 72.1 kg   SpO2 100%   BMI 22.18 kg/m  Physical Exam CONSTITUTIONAL: Elderly, no acute distress HEAD: Normocephalic/atraumatic EYES: EOMI/PERRL ENMT: Mucous membranes moist NECK: supple no meningeal signs CV: S1/S2 noted, no murmurs/rubs/gallops noted LUNGS: Lungs are clear to auscultation bilaterally, no apparent distress ABDOMEN: soft, nontender, no rebound or guarding, bowel sounds noted throughout abdomen GU patient wearing diaper, no obvious inguinal hernia, no scrotal tenderness NEURO: Pt is awake/alert, but appears confused moves all extremitiesx4.  No facial droop.  No arm or leg drift EXTREMITIES: pulses normal/equal, full ROM, no deformities SKIN: warm, color normal  ED Results / Procedures / Treatments   Labs (all labs ordered are listed, but only abnormal results are displayed) Labs Reviewed  CBC WITH DIFFERENTIAL/PLATELET - Abnormal; Notable for  the following components:      Result Value   RBC 3.73 (*)    Hemoglobin 9.7 (*)    HCT 29.7 (*)    MCV 79.6 (*)    All other components within normal limits  COMPREHENSIVE METABOLIC PANEL - Abnormal; Notable for the following components:   Potassium 5.2 (*)    CO2 19 (*)    Glucose, Bld 140 (*)    BUN 42 (*)    Creatinine, Ser 2.93 (*)    Albumin 3.4 (*)    GFR, Estimated 20 (*)    All other components within normal limits  CBG MONITORING, ED - Abnormal; Notable for the following components:   Glucose-Capillary 151 (*)    All  other components within normal limits  URINALYSIS, ROUTINE W REFLEX MICROSCOPIC    EKG None  Radiology No results found.  Procedures Procedures  {Document cardiac monitor, telemetry assessment procedure when appropriate:1}  Medications Ordered in ED Medications - No data to display  ED Course/ Medical Decision Making/ A&P Clinical Course as of 12/31/23 0002  Mon Dec 30, 2023  2346 Potassium(!): 5.2 Mild hyperkalemia [DW]  2346 Creatinine(!): 2.93 Acute on chronic renal failure [DW]  2346 Hemoglobin(!): 9.7 Anemia [DW]  Tue Dec 31, 2023  0001 Discussed with his daughter [name] via the phone.  She reports for the past week he has had increasing confusion and sluggishness.  She reports he has had no major falls, no fevers or vomiting.  No focal weakness.  She is concerned about a UTI.  He has had this previously.  No new medications. [DW]    Clinical Course User Index [DW] Zadie Rhine, MD   {   Click here for ABCD2, HEART and other calculatorsREFRESH Note before signing :1}                              Medical Decision Making Amount and/or Complexity of Data Reviewed Labs: ordered. Decision-making details documented in ED Course. Radiology: ordered. ECG/medicine tests: ordered.   This patient presents to the ED for concern of weakness, this involves an extensive number of treatment options, and is a complaint that carries with it a high risk of complications and morbidity.  The differential diagnosis includes but is not limited to CVA, intracranial hemorrhage, acute coronary syndrome, renal failure, urinary tract infection, electrolyte disturbance, pneumonia   Comorbidities that complicate the patient evaluation: Patient's presentation is complicated by their history of chronic kidney disease  Social Determinants of Health: Patient's  confusion and poor mobility   increases the complexity of managing their presentation  Additional history obtained: Additional  history obtained from family Records reviewed previous admission documents  Lab Tests: I Ordered, and personally interpreted labs.  The pertinent results include: Chronic kidney disease, mild hyperkalemia  Imaging Studies ordered: I ordered imaging studies including X-ray chest   I independently visualized and interpreted imaging which showed *** I agree with the radiologist interpretation  Cardiac Monitoring: The patient was maintained on a cardiac monitor.  I personally viewed and interpreted the cardiac monitor which showed an underlying rhythm of:  {cardiac monitor:26849}  Medicines ordered and prescription drug management: I ordered medication including ***  for ***  Reevaluation of the patient after these medicines showed that the patient    {resolved/improved/worsened:23923::"improved"}  Test Considered: Patient is low risk / negative by ***, therefore do not feel that *** is indicated.  Critical Interventions:  .***  Consultations Obtained: I requested consultation with the {consultation:26851}, and discussed  findings as well as pertinent plan - they recommend: ***  Reevaluation: After the interventions noted above, I reevaluated the patient and found that they have :{resolved/improved/worsened:23923::"improved"}  Complexity of problems addressed: Patient's presentation is most consistent with  {WUJW:11914}  Disposition: After consideration of the diagnostic results and the patient's response to treatment,  I feel that the patent would benefit from {disposition:26850}.     {Document critical care time when appropriate:1} {Document review of labs and clinical decision tools ie heart score, Chads2Vasc2 etc:1}  {Document your independent review of radiology images, and any outside records:1} {Document your discussion with family members, caretakers, and with consultants:1} {Document social determinants of health affecting pt's care:1} {Document your decision making  why or why not admission, treatments were needed:1} Final Clinical Impression(s) / ED Diagnoses Final diagnoses:  None    Rx / DC Orders ED Discharge Orders     None

## 2023-12-31 NOTE — Progress Notes (Signed)
DISCHARGE NOTE HOME Jonathan Baird to be discharged Home per MD order. Discussed prescriptions and follow up appointments with the patient and daughter [name]. Prescriptions given to patient; medication list explained in detail. Patient and daughter verbalized understanding.  Skin clean, dry and intact without evidence of skin break down, no evidence of skin tears noted. IV catheter discontinued intact. Site without signs and symptoms of complications. Dressing and pressure applied. Pt denies pain at the site currently. No complaints noted.  Patient free of lines, drains, and wounds.   An After Visit Summary (AVS) was printed and given to the patient. Patient escorted via wheelchair to discharge lounge.  Margarita Grizzle, RN

## 2023-12-31 NOTE — TOC Transition Note (Addendum)
Transition of Care [facility]) - Discharge Note   Patient Details  Name: Jonathan Baird MRN: [id number] Date of Birth: Jan 31, 1933  [facility] CM/SW Contact:  Tom-Johnson, Hershal Coria, RN Phone Number: 12/31/2023, 1:38 PM   Clinical Narrative:     Patient is scheduled for discharge today.  Home health info and discharge instructions on AVS. Daughter, [name] states patient has all necessary DME's at home.  Daughter, [name] to transport at discharge.  No further TOC needs noted.           Final next level of care: Home w Home Health Services Barriers to Discharge: Barriers Resolved   Patient Goals and CMS Choice Patient states their goals for this hospitalization and ongoing recovery are:: To return home CMS Medicare.gov Compare Post Acute Care list provided to:: Patient Choice offered to / list presented to : Patient, Adult Children (Daughter, [name].)      Discharge Placement                Patient to be transferred to facility by: Daughter Name of family member notified: [name]    Discharge Plan and Services Additional resources added to the After Visit Summary for                  DME Arranged: N/A DME Agency: NA       HH Arranged: PT, OT, RN, Disease Management HH Agency: [facility] Date [facility] Agency Contacted: 12/31/23 Time HH Agency Contacted: 1328 Representative spoke with at [facility] Agency: Kandee Keen  Social Drivers of Health (SDOH) Interventions SDOH Screenings   Food Insecurity: No Food Insecurity (10/20/2023)  Housing: Patient Declined (10/20/2023)  Transportation Needs: No Transportation Needs (10/20/2023)  Utilities: Not At Risk (10/20/2023)  Tobacco Use: Low Risk  (12/31/2023)     Readmission Risk Interventions     No data to display

## 2023-12-31 NOTE — Discharge Summary (Signed)
Physician Discharge Summary  Jonathan Baird WJX:[id number] DOB: 12/06/1932 DOA: 12/30/2023  PCP: Georgianne Fick, MD  Admit date: 12/30/2023 Discharge date: 12/31/2023  Admitted From: Home Disposition: Home with home health therapies  Recommendations for Outpatient Follow-up:  Follow up with PCP in 1-2 weeks Please obtain BMP/CBC in one week 3.  Follow-up with neurology  Home Health: PT/OT Equipment/Devices: Available at home  Discharge Condition: Stable CODE STATUS: Full code Diet recommendation: Low-salt and low-carb diet  Discharge summary: See H&P done earlier morning by nighttime hospitalist.  In brief, 89 year old with history of dementia, essential hypertension, hyperlipidemia, insulin-dependent type 2 diabetes, stage IV CKD, peripheral vascular disease, right internal carotid artery stenosis on Xarelto, history of colon cancer previously treated with surgery has progressive weakness and urinary frequency at home so brought to the ER.  In the emergency room hemodynamically stable.  Admitted for observation to further investigate.  Progressive weakness with underlying dementia: No evidence of active infection.  UA was normal.  Clinical exam not consistent with any new infection or new neurological findings. CT head was still normal.  MRI of the brain shows cerebral atrophy, chronic microvascular changes but no acute findings. Patient is currently stable. Probably has prostatic symptoms, he has intermittent dysuria without abnormal urine.  Will prescribe Flomax 0.4 mg daily. Discussed with family.  They would like to work with PT OT at home.  Stable for discharge. Resume all home medications.   Discharge Diagnoses:  Principal Problem:   Generalized weakness Active Problems:   Essential hypertension   Hyperlipidemia   PVD (peripheral vascular disease) (HCC)   Insulin dependent type 2 diabetes mellitus (HCC)   History of dementia   CKD (chronic kidney disease), stage  IV (HCC)   History of carotid artery stenosis   History of colon cancer status post colectomy   Hyperkalemia   Urinary hesitancy    Discharge Instructions  Discharge Instructions     Diet - low sodium heart healthy   Complete by: As directed    Increase activity slowly   Complete by: As directed       Allergies as of 12/31/2023   No Known Allergies      Medication List     TAKE these medications    amLODipine 10 MG tablet Commonly known as: NORVASC Take 10 mg by mouth daily.   Aspirin 81 81 MG chewable tablet Generic drug: aspirin 1 tablet   calcitRIOL 0.25 MCG capsule Commonly known as: ROCALTROL Take 0.25 mcg by mouth daily.   ferrous sulfate 325 (65 FE) MG tablet Take 1 tablet (325 mg total) by mouth daily with breakfast.   Gvoke HypoPen 2-Pack 1 MG/0.2ML Soaj Generic drug: Glucagon Inject 1 mg into the skin once as needed (for hypoglycemia).   HumaLOG KwikPen 100 UNIT/ML KwikPen Generic drug: insulin lispro Inject 4 Units into the skin 3 (three) times daily after meals.   hydrALAZINE 50 MG tablet Commonly known as: APRESOLINE Take 50 mg by mouth 3 (three) times daily. What changed: Another medication with the same name was removed. Continue taking this medication, and follow the directions you see here.   hydrochlorothiazide 25 MG tablet Commonly known as: HYDRODIURIL Take 25 mg by mouth daily.   lisinopril 40 MG tablet Commonly known as: ZESTRIL Take 40 mg by mouth daily.   rosuvastatin 10 MG tablet Commonly known as: CRESTOR Take 10 mg by mouth daily.   sodium bicarbonate 650 MG tablet Take 650 mg by mouth daily.   Trulicity  1.5 MG/0.5ML Soaj Generic drug: Dulaglutide Inject into the skin.   Xarelto 2.5 MG Tabs tablet Generic drug: rivaroxaban TAKE 1 TABLET BY MOUTH TWICE A DAY. TAKE WITH ASPIRIN 81MG  DAILY.        No Known Allergies  Consultations: None   Procedures/Studies: MR BRAIN WO CONTRAST Result Date:  12/31/2023 CLINICAL DATA:  Mental status change, persistent or worsening. Generalized weakness has been worsening. EXAM: MRI HEAD WITHOUT CONTRAST TECHNIQUE: Multiplanar, multiecho pulse sequences of the brain and surrounding structures were obtained without intravenous contrast. COMPARISON:  Head CT 12/31/2023 and MRI 10/22/2023 FINDINGS: Brain: There is no evidence of an acute infarct, mass, midline shift, or extra-axial fluid collection. Scattered chronic cerebral microhemorrhages are similar to the prior MRI. Patchy and confluent T2 hyperintensities in the cerebral white matter and pons are also similar to the prior MRI and are nonspecific but compatible with moderate to severe chronic small vessel ischemic disease. Small chronic infarcts in the thalami and right cerebellar hemisphere are unchanged. Ventricular enlargement is unchanged and is again noted to be disproportionate to the degree of sulcal enlargement with an acute callosal angle and gyral crowding at the vertex. Vascular: Major intracranial vascular flow voids are preserved. Skull and upper cervical spine: Unremarkable bone marrow signal. Sinuses/Orbits: Bilateral cataract extraction. No evidence of acute inflammatory sinus disease. No significant mastoid fluid. Other: None. IMPRESSION: 1. No acute intracranial abnormality. 2. Moderate to severe chronic small vessel ischemic disease. 3. Unchanged ventricular enlargement which could reflect a component of normal pressure hydrocephalus in the appropriate clinical setting in addition to ex vacuo dilatation from cerebral atrophy. Electronically Signed   By: Sebastian Ache M.D.   On: 12/31/2023 10:19   CT Head Wo Contrast Result Date: 12/31/2023 CLINICAL DATA:  Mental status changes, unknown cause, dysuria and confusion. EXAM: CT HEAD WITHOUT CONTRAST TECHNIQUE: Contiguous axial images were obtained from the base of the skull through the vertex without intravenous contrast. RADIATION DOSE REDUCTION: This  exam was performed according to the departmental dose-optimization program which includes automated exposure control, adjustment of the mA and/or kV according to patient size and/or use of iterative reconstruction technique. COMPARISON:  Head CT 10/20/2023 FINDINGS: Brain: There is moderate cerebral atrophy, moderate atrophic ventriculomegaly and moderate to severe small vessel disease of the cerebral white matter with mild cerebellar atrophy. There is a small chronic lacunar infarct in the superior right cerebellar hemisphere. There is dystrophic calcification in the frontal falx. No focal asymmetry concerning for a cortical based acute infarct, hemorrhage, mass or mass effect is seen. There is no midline shift. The basal cisterns are clear. Senescent mineralization both basal ganglia. Vascular: The carotid siphons are heavily calcified. There is patchy calcification in the distal vertebral arteries. No hyperdense central vessels. Skull: Negative for fractures or focal lesions. Sinuses/Orbits: No acute finding. Old lens replacements are again noted. Clear sinuses and mastoids. Other: None. IMPRESSION: 1. No acute intracranial CT findings or interval changes. 2. Atrophy and small-vessel disease. 3. Carotid and vertebral artery atherosclerosis. Electronically Signed   By: Almira Bar M.D.   On: 12/31/2023 02:26   DG Chest Portable 1 View Result Date: 12/31/2023 CLINICAL DATA:  Weakness with discolored urine, initial encounter EXAM: PORTABLE CHEST 1 VIEW COMPARISON:  10/19/2023 FINDINGS: Cardiac shadow is stable. Mild central vascular congestion is noted. No focal infiltrate or effusion is seen. No bony abnormality is noted. IMPRESSION: Mild central vascular prominence without edema. Electronically Signed   By: Alcide Clever M.D.   On: 12/31/2023  00:19   (Echo, Carotid, EGD, Colonoscopy, ERCP)    Subjective: Patient seen in the morning rounds.  Denies any complaints.  Patient looks quiet and composed. Poor  historian.  Called and discussed with patient's daughter.   Discharge Exam: Vitals:   12/31/23 0726 12/31/23 0934  BP: (!) 157/76 (!) 148/74  Pulse: 76 77  Resp:    Temp:  98 F (36.7 C)  SpO2:  100%   Vitals:   12/31/23 0620 12/31/23 0631 12/31/23 0726 12/31/23 0934  BP: (!) 192/72 (!) 192/72 (!) 157/76 (!) 148/74  Pulse: 73  76 77  Resp:      Temp: 98 F (36.7 C)   98 F (36.7 C)  TempSrc: Oral   Oral  SpO2: 100%   100%  Weight:      Height:        General: Pt is alert, awake, not in acute distress Cardiovascular: RRR, S1/S2 +, no rubs, no gallops Respiratory: CTA bilaterally, no wheezing, no rhonchi Abdominal: Soft, NT, ND, bowel sounds + Extremities: no edema, no cyanosis He is alert awake.  He is oriented to himself and family and place.  Slow to respond.  Moves all extremities equally.    The results of significant diagnostics from this hospitalization (including imaging, microbiology, ancillary and laboratory) are listed below for reference.     Microbiology: No results found for this or any previous visit (from the past 240 hours).   Labs: BNP (last 3 results) No results for input(s): "BNP" in the last 8760 hours. Basic Metabolic Panel: Recent Labs  Lab 12/30/23 1713 12/31/23 0747  NA 138 140  K 5.2* 3.8  CL 108 105  CO2 19* 22  GLUCOSE 140* 171*  BUN 42* 43*  CREATININE 2.93* 2.87*  CALCIUM 9.1 10.0   Liver Function Tests: Recent Labs  Lab 12/30/23 1713 12/31/23 0747  AST 21 17  ALT 18 16  ALKPHOS 75 71  BILITOT 0.5 0.4  PROT 7.4 7.2  ALBUMIN 3.4* 3.4*   No results for input(s): "LIPASE", "AMYLASE" in the last 168 hours. No results for input(s): "AMMONIA" in the last 168 hours. CBC: Recent Labs  Lab 12/30/23 1755 12/31/23 0747  WBC 8.9 7.3  NEUTROABS 6.5  --   HGB 9.7* 10.0*  HCT 29.7* 29.6*  MCV 79.6* 76.9*  PLT 240 259   Cardiac Enzymes: Recent Labs  Lab 12/31/23 0747  CKTOTAL 116   BNP: Invalid input(s):  "POCBNP" CBG: Recent Labs  Lab 12/30/23 1656 12/31/23 0530 12/31/23 0713 12/31/23 1123  GLUCAP 151* 158* 170* 196*   D-Dimer No results for input(s): "DDIMER" in the last 72 hours. Hgb A1c No results for input(s): "HGBA1C" in the last 72 hours. Lipid Profile No results for input(s): "CHOL", "HDL", "LDLCALC", "TRIG", "CHOLHDL", "LDLDIRECT" in the last 72 hours. Thyroid function studies No results for input(s): "TSH", "T4TOTAL", "T3FREE", "THYROIDAB" in the last 72 hours.  Invalid input(s): "FREET3" Anemia work up No results for input(s): "VITAMINB12", "FOLATE", "FERRITIN", "TIBC", "IRON", "RETICCTPCT" in the last 72 hours. Urinalysis    Component Value Date/Time   COLORURINE YELLOW 12/31/2023 0040   APPEARANCEUR CLEAR 12/31/2023 0040   LABSPEC 1.012 12/31/2023 0040   PHURINE 5.0 12/31/2023 0040   GLUCOSEU 50 (A) 12/31/2023 0040   HGBUR SMALL (A) 12/31/2023 0040   BILIRUBINUR NEGATIVE 12/31/2023 0040   KETONESUR NEGATIVE 12/31/2023 0040   PROTEINUR 100 (A) 12/31/2023 0040   NITRITE NEGATIVE 12/31/2023 0040   LEUKOCYTESUR NEGATIVE 12/31/2023 0040  Sepsis Labs Recent Labs  Lab 12/30/23 1755 12/31/23 0747  WBC 8.9 7.3   Microbiology No results found for this or any previous visit (from the past 240 hours).   Time coordinating discharge: 35 minutes  SIGNED:   Dorcas Carrow, MD  [facility] 12/31/2023, 12:04 PM

## 2023-12-31 NOTE — Progress Notes (Incomplete)
 Patient

## 2023-12-31 NOTE — Progress Notes (Deleted)
DISCHARGE NOTE HOME [facility] PIPER to be discharged Skilled nursing facility per MD order. Discussed prescriptions and follow up appointments with the patient. Prescriptions given to PTAR; medication list explained in detail. Report called to receiving facility.  Skin clean, dry and intact without evidence of skin break down, no evidence of skin tears noted. IV catheter discontinued intact. Site without signs and symptoms of complications. Dressing and pressure applied. Pt denies pain at the site currently. No complaints noted.  Patient free of lines, drains, and wounds.   An After Visit Summary (AVS) was printed and given to the patient. Patient escorted via wheelchair, and discharged to SNF via ambulance Margarita Grizzle, RN

## 2024-01-04 DIAGNOSIS — E1165 Type 2 diabetes mellitus with hyperglycemia: Secondary | ICD-10-CM | POA: Diagnosis not present

## 2024-01-05 DIAGNOSIS — R3911 Hesitancy of micturition: Secondary | ICD-10-CM | POA: Diagnosis not present

## 2024-01-05 DIAGNOSIS — R350 Frequency of micturition: Secondary | ICD-10-CM | POA: Diagnosis not present

## 2024-01-05 DIAGNOSIS — N401 Enlarged prostate with lower urinary tract symptoms: Secondary | ICD-10-CM | POA: Diagnosis not present

## 2024-01-05 DIAGNOSIS — F03918 Unspecified dementia, unspecified severity, with other behavioral disturbance: Secondary | ICD-10-CM | POA: Diagnosis not present

## 2024-01-05 DIAGNOSIS — R338 Other retention of urine: Secondary | ICD-10-CM | POA: Diagnosis not present

## 2024-01-05 DIAGNOSIS — I129 Hypertensive chronic kidney disease with stage 1 through stage 4 chronic kidney disease, or unspecified chronic kidney disease: Secondary | ICD-10-CM | POA: Diagnosis not present

## 2024-01-05 DIAGNOSIS — E1122 Type 2 diabetes mellitus with diabetic chronic kidney disease: Secondary | ICD-10-CM | POA: Diagnosis not present

## 2024-01-05 DIAGNOSIS — F05 Delirium due to known physiological condition: Secondary | ICD-10-CM | POA: Diagnosis not present

## 2024-01-05 DIAGNOSIS — N179 Acute kidney failure, unspecified: Secondary | ICD-10-CM | POA: Diagnosis not present

## 2024-01-07 DIAGNOSIS — I129 Hypertensive chronic kidney disease with stage 1 through stage 4 chronic kidney disease, or unspecified chronic kidney disease: Secondary | ICD-10-CM | POA: Diagnosis not present

## 2024-01-10 DIAGNOSIS — F03918 Unspecified dementia, unspecified severity, with other behavioral disturbance: Secondary | ICD-10-CM | POA: Diagnosis not present

## 2024-01-10 DIAGNOSIS — R338 Other retention of urine: Secondary | ICD-10-CM | POA: Diagnosis not present

## 2024-01-10 DIAGNOSIS — N179 Acute kidney failure, unspecified: Secondary | ICD-10-CM | POA: Diagnosis not present

## 2024-01-10 DIAGNOSIS — E1122 Type 2 diabetes mellitus with diabetic chronic kidney disease: Secondary | ICD-10-CM | POA: Diagnosis not present

## 2024-01-10 DIAGNOSIS — R3911 Hesitancy of micturition: Secondary | ICD-10-CM | POA: Diagnosis not present

## 2024-01-10 DIAGNOSIS — I129 Hypertensive chronic kidney disease with stage 1 through stage 4 chronic kidney disease, or unspecified chronic kidney disease: Secondary | ICD-10-CM | POA: Diagnosis not present

## 2024-01-10 DIAGNOSIS — N401 Enlarged prostate with lower urinary tract symptoms: Secondary | ICD-10-CM | POA: Diagnosis not present

## 2024-01-10 DIAGNOSIS — R350 Frequency of micturition: Secondary | ICD-10-CM | POA: Diagnosis not present

## 2024-01-10 DIAGNOSIS — F05 Delirium due to known physiological condition: Secondary | ICD-10-CM | POA: Diagnosis not present

## 2024-01-17 DIAGNOSIS — R350 Frequency of micturition: Secondary | ICD-10-CM | POA: Diagnosis not present

## 2024-01-17 DIAGNOSIS — N401 Enlarged prostate with lower urinary tract symptoms: Secondary | ICD-10-CM | POA: Diagnosis not present

## 2024-01-17 DIAGNOSIS — R338 Other retention of urine: Secondary | ICD-10-CM | POA: Diagnosis not present

## 2024-01-17 DIAGNOSIS — E1122 Type 2 diabetes mellitus with diabetic chronic kidney disease: Secondary | ICD-10-CM | POA: Diagnosis not present

## 2024-01-17 DIAGNOSIS — I129 Hypertensive chronic kidney disease with stage 1 through stage 4 chronic kidney disease, or unspecified chronic kidney disease: Secondary | ICD-10-CM | POA: Diagnosis not present

## 2024-01-17 DIAGNOSIS — N179 Acute kidney failure, unspecified: Secondary | ICD-10-CM | POA: Diagnosis not present

## 2024-01-17 DIAGNOSIS — F03918 Unspecified dementia, unspecified severity, with other behavioral disturbance: Secondary | ICD-10-CM | POA: Diagnosis not present

## 2024-01-17 DIAGNOSIS — R3911 Hesitancy of micturition: Secondary | ICD-10-CM | POA: Diagnosis not present

## 2024-01-17 DIAGNOSIS — F05 Delirium due to known physiological condition: Secondary | ICD-10-CM | POA: Diagnosis not present

## 2024-01-20 DIAGNOSIS — R350 Frequency of micturition: Secondary | ICD-10-CM | POA: Diagnosis not present

## 2024-01-20 DIAGNOSIS — N401 Enlarged prostate with lower urinary tract symptoms: Secondary | ICD-10-CM | POA: Diagnosis not present

## 2024-01-20 DIAGNOSIS — I129 Hypertensive chronic kidney disease with stage 1 through stage 4 chronic kidney disease, or unspecified chronic kidney disease: Secondary | ICD-10-CM | POA: Diagnosis not present

## 2024-01-20 DIAGNOSIS — R3911 Hesitancy of micturition: Secondary | ICD-10-CM | POA: Diagnosis not present

## 2024-01-20 DIAGNOSIS — N179 Acute kidney failure, unspecified: Secondary | ICD-10-CM | POA: Diagnosis not present

## 2024-01-20 DIAGNOSIS — F05 Delirium due to known physiological condition: Secondary | ICD-10-CM | POA: Diagnosis not present

## 2024-01-20 DIAGNOSIS — E1122 Type 2 diabetes mellitus with diabetic chronic kidney disease: Secondary | ICD-10-CM | POA: Diagnosis not present

## 2024-01-20 DIAGNOSIS — F03918 Unspecified dementia, unspecified severity, with other behavioral disturbance: Secondary | ICD-10-CM | POA: Diagnosis not present

## 2024-01-20 DIAGNOSIS — R338 Other retention of urine: Secondary | ICD-10-CM | POA: Diagnosis not present

## 2024-01-22 DIAGNOSIS — R338 Other retention of urine: Secondary | ICD-10-CM | POA: Diagnosis not present

## 2024-01-22 DIAGNOSIS — R350 Frequency of micturition: Secondary | ICD-10-CM | POA: Diagnosis not present

## 2024-01-22 DIAGNOSIS — F03918 Unspecified dementia, unspecified severity, with other behavioral disturbance: Secondary | ICD-10-CM | POA: Diagnosis not present

## 2024-01-22 DIAGNOSIS — E1122 Type 2 diabetes mellitus with diabetic chronic kidney disease: Secondary | ICD-10-CM | POA: Diagnosis not present

## 2024-01-22 DIAGNOSIS — R3911 Hesitancy of micturition: Secondary | ICD-10-CM | POA: Diagnosis not present

## 2024-01-22 DIAGNOSIS — I129 Hypertensive chronic kidney disease with stage 1 through stage 4 chronic kidney disease, or unspecified chronic kidney disease: Secondary | ICD-10-CM | POA: Diagnosis not present

## 2024-01-22 DIAGNOSIS — N401 Enlarged prostate with lower urinary tract symptoms: Secondary | ICD-10-CM | POA: Diagnosis not present

## 2024-01-22 DIAGNOSIS — F05 Delirium due to known physiological condition: Secondary | ICD-10-CM | POA: Diagnosis not present

## 2024-01-22 DIAGNOSIS — N179 Acute kidney failure, unspecified: Secondary | ICD-10-CM | POA: Diagnosis not present

## 2024-01-28 DIAGNOSIS — I129 Hypertensive chronic kidney disease with stage 1 through stage 4 chronic kidney disease, or unspecified chronic kidney disease: Secondary | ICD-10-CM | POA: Diagnosis not present

## 2024-01-28 DIAGNOSIS — R4182 Altered mental status, unspecified: Secondary | ICD-10-CM | POA: Diagnosis not present

## 2024-01-28 DIAGNOSIS — N184 Chronic kidney disease, stage 4 (severe): Secondary | ICD-10-CM | POA: Diagnosis not present

## 2024-01-28 DIAGNOSIS — R54 Age-related physical debility: Secondary | ICD-10-CM | POA: Diagnosis not present

## 2024-01-28 DIAGNOSIS — E1122 Type 2 diabetes mellitus with diabetic chronic kidney disease: Secondary | ICD-10-CM | POA: Diagnosis not present

## 2024-01-28 DIAGNOSIS — Z794 Long term (current) use of insulin: Secondary | ICD-10-CM | POA: Diagnosis not present

## 2024-01-28 DIAGNOSIS — R600 Localized edema: Secondary | ICD-10-CM | POA: Diagnosis not present

## 2024-01-29 DIAGNOSIS — R338 Other retention of urine: Secondary | ICD-10-CM | POA: Diagnosis not present

## 2024-01-29 DIAGNOSIS — N401 Enlarged prostate with lower urinary tract symptoms: Secondary | ICD-10-CM | POA: Diagnosis not present

## 2024-01-29 DIAGNOSIS — N179 Acute kidney failure, unspecified: Secondary | ICD-10-CM | POA: Diagnosis not present

## 2024-01-29 DIAGNOSIS — R3911 Hesitancy of micturition: Secondary | ICD-10-CM | POA: Diagnosis not present

## 2024-01-29 DIAGNOSIS — F03918 Unspecified dementia, unspecified severity, with other behavioral disturbance: Secondary | ICD-10-CM | POA: Diagnosis not present

## 2024-01-29 DIAGNOSIS — I129 Hypertensive chronic kidney disease with stage 1 through stage 4 chronic kidney disease, or unspecified chronic kidney disease: Secondary | ICD-10-CM | POA: Diagnosis not present

## 2024-01-29 DIAGNOSIS — F05 Delirium due to known physiological condition: Secondary | ICD-10-CM | POA: Diagnosis not present

## 2024-01-29 DIAGNOSIS — E1122 Type 2 diabetes mellitus with diabetic chronic kidney disease: Secondary | ICD-10-CM | POA: Diagnosis not present

## 2024-01-29 DIAGNOSIS — R350 Frequency of micturition: Secondary | ICD-10-CM | POA: Diagnosis not present

## 2024-01-31 DIAGNOSIS — R3911 Hesitancy of micturition: Secondary | ICD-10-CM | POA: Diagnosis not present

## 2024-01-31 DIAGNOSIS — N401 Enlarged prostate with lower urinary tract symptoms: Secondary | ICD-10-CM | POA: Diagnosis not present

## 2024-01-31 DIAGNOSIS — R350 Frequency of micturition: Secondary | ICD-10-CM | POA: Diagnosis not present

## 2024-01-31 DIAGNOSIS — I129 Hypertensive chronic kidney disease with stage 1 through stage 4 chronic kidney disease, or unspecified chronic kidney disease: Secondary | ICD-10-CM | POA: Diagnosis not present

## 2024-01-31 DIAGNOSIS — R338 Other retention of urine: Secondary | ICD-10-CM | POA: Diagnosis not present

## 2024-01-31 DIAGNOSIS — E1122 Type 2 diabetes mellitus with diabetic chronic kidney disease: Secondary | ICD-10-CM | POA: Diagnosis not present

## 2024-01-31 DIAGNOSIS — F05 Delirium due to known physiological condition: Secondary | ICD-10-CM | POA: Diagnosis not present

## 2024-01-31 DIAGNOSIS — N179 Acute kidney failure, unspecified: Secondary | ICD-10-CM | POA: Diagnosis not present

## 2024-01-31 DIAGNOSIS — F03918 Unspecified dementia, unspecified severity, with other behavioral disturbance: Secondary | ICD-10-CM | POA: Diagnosis not present

## 2024-02-01 DIAGNOSIS — E1165 Type 2 diabetes mellitus with hyperglycemia: Secondary | ICD-10-CM | POA: Diagnosis not present

## 2024-02-07 DIAGNOSIS — E1165 Type 2 diabetes mellitus with hyperglycemia: Secondary | ICD-10-CM | POA: Diagnosis not present

## 2024-02-07 DIAGNOSIS — R54 Age-related physical debility: Secondary | ICD-10-CM | POA: Diagnosis not present

## 2024-02-07 DIAGNOSIS — E785 Hyperlipidemia, unspecified: Secondary | ICD-10-CM | POA: Diagnosis not present

## 2024-02-07 DIAGNOSIS — I10 Essential (primary) hypertension: Secondary | ICD-10-CM | POA: Diagnosis not present

## 2024-02-13 DIAGNOSIS — E1159 Type 2 diabetes mellitus with other circulatory complications: Secondary | ICD-10-CM | POA: Diagnosis not present

## 2024-02-13 DIAGNOSIS — I10 Essential (primary) hypertension: Secondary | ICD-10-CM | POA: Diagnosis not present

## 2024-02-13 DIAGNOSIS — J309 Allergic rhinitis, unspecified: Secondary | ICD-10-CM | POA: Diagnosis not present

## 2024-03-03 DIAGNOSIS — R54 Age-related physical debility: Secondary | ICD-10-CM | POA: Diagnosis not present

## 2024-03-03 DIAGNOSIS — I10 Essential (primary) hypertension: Secondary | ICD-10-CM | POA: Diagnosis not present

## 2024-03-03 DIAGNOSIS — E1165 Type 2 diabetes mellitus with hyperglycemia: Secondary | ICD-10-CM | POA: Diagnosis not present

## 2024-03-03 DIAGNOSIS — E785 Hyperlipidemia, unspecified: Secondary | ICD-10-CM | POA: Diagnosis not present

## 2024-03-12 DIAGNOSIS — R600 Localized edema: Secondary | ICD-10-CM | POA: Diagnosis not present

## 2024-03-12 DIAGNOSIS — I129 Hypertensive chronic kidney disease with stage 1 through stage 4 chronic kidney disease, or unspecified chronic kidney disease: Secondary | ICD-10-CM | POA: Diagnosis not present

## 2024-03-12 DIAGNOSIS — N1832 Chronic kidney disease, stage 3b: Secondary | ICD-10-CM | POA: Diagnosis not present

## 2024-03-12 DIAGNOSIS — E785 Hyperlipidemia, unspecified: Secondary | ICD-10-CM | POA: Diagnosis not present

## 2024-03-12 DIAGNOSIS — I6529 Occlusion and stenosis of unspecified carotid artery: Secondary | ICD-10-CM | POA: Diagnosis not present

## 2024-03-12 DIAGNOSIS — E1165 Type 2 diabetes mellitus with hyperglycemia: Secondary | ICD-10-CM | POA: Diagnosis not present

## 2024-04-08 DIAGNOSIS — R54 Age-related physical debility: Secondary | ICD-10-CM | POA: Diagnosis not present

## 2024-04-08 DIAGNOSIS — E785 Hyperlipidemia, unspecified: Secondary | ICD-10-CM | POA: Diagnosis not present

## 2024-04-08 DIAGNOSIS — E1165 Type 2 diabetes mellitus with hyperglycemia: Secondary | ICD-10-CM | POA: Diagnosis not present

## 2024-04-08 DIAGNOSIS — I10 Essential (primary) hypertension: Secondary | ICD-10-CM | POA: Diagnosis not present

## 2024-04-21 DIAGNOSIS — E785 Hyperlipidemia, unspecified: Secondary | ICD-10-CM | POA: Diagnosis not present

## 2024-04-21 DIAGNOSIS — Z7901 Long term (current) use of anticoagulants: Secondary | ICD-10-CM | POA: Diagnosis not present

## 2024-04-21 DIAGNOSIS — I779 Disorder of arteries and arterioles, unspecified: Secondary | ICD-10-CM | POA: Diagnosis not present

## 2024-04-21 DIAGNOSIS — N184 Chronic kidney disease, stage 4 (severe): Secondary | ICD-10-CM | POA: Diagnosis not present

## 2024-04-21 DIAGNOSIS — I129 Hypertensive chronic kidney disease with stage 1 through stage 4 chronic kidney disease, or unspecified chronic kidney disease: Secondary | ICD-10-CM | POA: Diagnosis not present

## 2024-04-21 DIAGNOSIS — R54 Age-related physical debility: Secondary | ICD-10-CM | POA: Diagnosis not present

## 2024-04-21 DIAGNOSIS — E1122 Type 2 diabetes mellitus with diabetic chronic kidney disease: Secondary | ICD-10-CM | POA: Diagnosis not present

## 2024-04-21 DIAGNOSIS — Z794 Long term (current) use of insulin: Secondary | ICD-10-CM | POA: Diagnosis not present

## 2024-04-23 DIAGNOSIS — I10 Essential (primary) hypertension: Secondary | ICD-10-CM | POA: Diagnosis not present

## 2024-04-23 DIAGNOSIS — E1165 Type 2 diabetes mellitus with hyperglycemia: Secondary | ICD-10-CM | POA: Diagnosis not present

## 2024-04-23 DIAGNOSIS — E785 Hyperlipidemia, unspecified: Secondary | ICD-10-CM | POA: Diagnosis not present

## 2024-04-23 DIAGNOSIS — R54 Age-related physical debility: Secondary | ICD-10-CM | POA: Diagnosis not present

## 2024-05-20 ENCOUNTER — Encounter (HOSPITAL_COMMUNITY): Payer: Self-pay

## 2024-05-20 ENCOUNTER — Inpatient Hospital Stay (HOSPITAL_COMMUNITY)
Admission: EM | Admit: 2024-05-20 | Discharge: 2024-06-04 | DRG: 870 | Disposition: A | Discharge status: Skilled Nursing Facility | Attending: Internal Medicine | Admitting: Internal Medicine

## 2024-05-20 ENCOUNTER — Emergency Department (HOSPITAL_COMMUNITY)

## 2024-05-20 DIAGNOSIS — Z6820 Body mass index (BMI) 20.0-20.9, adult: Secondary | ICD-10-CM

## 2024-05-20 DIAGNOSIS — N179 Acute kidney failure, unspecified: Secondary | ICD-10-CM | POA: Diagnosis present

## 2024-05-20 DIAGNOSIS — Z794 Long term (current) use of insulin: Secondary | ICD-10-CM

## 2024-05-20 DIAGNOSIS — K56609 Unspecified intestinal obstruction, unspecified as to partial versus complete obstruction: Secondary | ICD-10-CM | POA: Diagnosis present

## 2024-05-20 DIAGNOSIS — R6521 Severe sepsis with septic shock: Secondary | ICD-10-CM | POA: Diagnosis present

## 2024-05-20 DIAGNOSIS — Z66 Do not resuscitate: Secondary | ICD-10-CM | POA: Diagnosis not present

## 2024-05-20 DIAGNOSIS — I2510 Atherosclerotic heart disease of native coronary artery without angina pectoris: Secondary | ICD-10-CM | POA: Diagnosis present

## 2024-05-20 DIAGNOSIS — E870 Hyperosmolality and hypernatremia: Secondary | ICD-10-CM | POA: Diagnosis not present

## 2024-05-20 DIAGNOSIS — E1122 Type 2 diabetes mellitus with diabetic chronic kidney disease: Secondary | ICD-10-CM | POA: Diagnosis present

## 2024-05-20 DIAGNOSIS — I5A Non-ischemic myocardial injury (non-traumatic): Secondary | ICD-10-CM | POA: Diagnosis present

## 2024-05-20 DIAGNOSIS — G9389 Other specified disorders of brain: Secondary | ICD-10-CM | POA: Diagnosis present

## 2024-05-20 DIAGNOSIS — E1165 Type 2 diabetes mellitus with hyperglycemia: Secondary | ICD-10-CM | POA: Diagnosis present

## 2024-05-20 DIAGNOSIS — L89616 Pressure-induced deep tissue damage of right heel: Secondary | ICD-10-CM | POA: Diagnosis present

## 2024-05-20 DIAGNOSIS — R17 Unspecified jaundice: Secondary | ICD-10-CM | POA: Diagnosis present

## 2024-05-20 DIAGNOSIS — Z85038 Personal history of other malignant neoplasm of large intestine: Secondary | ICD-10-CM

## 2024-05-20 DIAGNOSIS — E785 Hyperlipidemia, unspecified: Secondary | ICD-10-CM | POA: Diagnosis present

## 2024-05-20 DIAGNOSIS — Z87440 Personal history of urinary (tract) infections: Secondary | ICD-10-CM

## 2024-05-20 DIAGNOSIS — R569 Unspecified convulsions: Secondary | ICD-10-CM | POA: Diagnosis present

## 2024-05-20 DIAGNOSIS — R0902 Hypoxemia: Secondary | ICD-10-CM

## 2024-05-20 DIAGNOSIS — E1151 Type 2 diabetes mellitus with diabetic peripheral angiopathy without gangrene: Secondary | ICD-10-CM | POA: Diagnosis present

## 2024-05-20 DIAGNOSIS — Z515 Encounter for palliative care: Secondary | ICD-10-CM

## 2024-05-20 DIAGNOSIS — R402421 Glasgow coma scale score 9-12, in the field [EMT or ambulance]: Secondary | ICD-10-CM

## 2024-05-20 DIAGNOSIS — R29736 NIHSS score 36: Secondary | ICD-10-CM | POA: Diagnosis not present

## 2024-05-20 DIAGNOSIS — J189 Pneumonia, unspecified organism: Secondary | ICD-10-CM | POA: Diagnosis present

## 2024-05-20 DIAGNOSIS — F0390 Unspecified dementia without behavioral disturbance: Secondary | ICD-10-CM | POA: Diagnosis not present

## 2024-05-20 DIAGNOSIS — G9340 Encephalopathy, unspecified: Secondary | ICD-10-CM

## 2024-05-20 DIAGNOSIS — E872 Acidosis, unspecified: Secondary | ICD-10-CM | POA: Diagnosis present

## 2024-05-20 DIAGNOSIS — G928 Other toxic encephalopathy: Secondary | ICD-10-CM | POA: Diagnosis present

## 2024-05-20 DIAGNOSIS — Z8673 Personal history of transient ischemic attack (TIA), and cerebral infarction without residual deficits: Secondary | ICD-10-CM

## 2024-05-20 DIAGNOSIS — R0603 Acute respiratory distress: Secondary | ICD-10-CM

## 2024-05-20 DIAGNOSIS — Z7985 Long-term (current) use of injectable non-insulin antidiabetic drugs: Secondary | ICD-10-CM

## 2024-05-20 DIAGNOSIS — R338 Other retention of urine: Secondary | ICD-10-CM | POA: Diagnosis present

## 2024-05-20 DIAGNOSIS — Z79899 Other long term (current) drug therapy: Secondary | ICD-10-CM

## 2024-05-20 DIAGNOSIS — J690 Pneumonitis due to inhalation of food and vomit: Secondary | ICD-10-CM | POA: Diagnosis present

## 2024-05-20 DIAGNOSIS — I129 Hypertensive chronic kidney disease with stage 1 through stage 4 chronic kidney disease, or unspecified chronic kidney disease: Secondary | ICD-10-CM | POA: Diagnosis present

## 2024-05-20 DIAGNOSIS — I6521 Occlusion and stenosis of right carotid artery: Secondary | ICD-10-CM | POA: Diagnosis present

## 2024-05-20 DIAGNOSIS — N184 Chronic kidney disease, stage 4 (severe): Secondary | ICD-10-CM | POA: Diagnosis present

## 2024-05-20 DIAGNOSIS — A419 Sepsis, unspecified organism: Principal | ICD-10-CM | POA: Diagnosis present

## 2024-05-20 DIAGNOSIS — E43 Unspecified severe protein-calorie malnutrition: Secondary | ICD-10-CM | POA: Diagnosis present

## 2024-05-20 DIAGNOSIS — I6381 Other cerebral infarction due to occlusion or stenosis of small artery: Secondary | ICD-10-CM | POA: Diagnosis present

## 2024-05-20 DIAGNOSIS — J9601 Acute respiratory failure with hypoxia: Principal | ICD-10-CM | POA: Diagnosis present

## 2024-05-20 DIAGNOSIS — Z7982 Long term (current) use of aspirin: Secondary | ICD-10-CM

## 2024-05-20 DIAGNOSIS — E861 Hypovolemia: Secondary | ICD-10-CM | POA: Diagnosis present

## 2024-05-20 HISTORY — DX: Chronic kidney disease, stage 4 (severe): N18.4

## 2024-05-20 HISTORY — DX: Occlusion and stenosis of unspecified carotid artery: I65.29

## 2024-05-20 HISTORY — DX: Hyperlipidemia, unspecified: E78.5

## 2024-05-20 HISTORY — DX: Peripheral vascular disease, unspecified: I73.9

## 2024-05-20 HISTORY — DX: Unspecified dementia, unspecified severity, without behavioral disturbance, psychotic disturbance, mood disturbance, and anxiety: F03.90

## 2024-05-20 HISTORY — DX: Essential (primary) hypertension: I10

## 2024-05-20 LAB — RESP PANEL BY RT-PCR (RSV, FLU A&B, COVID)  RVPGX2
Influenza A by PCR: NEGATIVE
Influenza B by PCR: NEGATIVE
Resp Syncytial Virus by PCR: NEGATIVE
SARS Coronavirus 2 by RT PCR: NEGATIVE

## 2024-05-20 LAB — CBC WITH DIFFERENTIAL/PLATELET
Abs Immature Granulocytes: 0.04 K/uL (ref 0.00–0.07)
Basophils Absolute: 0 K/uL (ref 0.0–0.1)
Basophils Relative: 0 %
Eosinophils Absolute: 0 K/uL (ref 0.0–0.5)
Eosinophils Relative: 0 %
HCT: 34.8 % — ABNORMAL LOW (ref 39.0–52.0)
Hemoglobin: 11.3 g/dL — ABNORMAL LOW (ref 13.0–17.0)
Immature Granulocytes: 0 %
Lymphocytes Relative: 4 %
Lymphs Abs: 0.5 K/uL — ABNORMAL LOW (ref 0.7–4.0)
MCH: 25.9 pg — ABNORMAL LOW (ref 26.0–34.0)
MCHC: 32.5 g/dL (ref 30.0–36.0)
MCV: 79.8 fL — ABNORMAL LOW (ref 80.0–100.0)
Monocytes Absolute: 0.4 K/uL (ref 0.1–1.0)
Monocytes Relative: 4 %
Neutro Abs: 10.5 K/uL — ABNORMAL HIGH (ref 1.7–7.7)
Neutrophils Relative %: 92 %
Platelets: 320 K/uL (ref 150–400)
RBC: 4.36 MIL/uL (ref 4.22–5.81)
RDW: 15.4 % (ref 11.5–15.5)
WBC: 11.4 K/uL — ABNORMAL HIGH (ref 4.0–10.5)
nRBC: 0 % (ref 0.0–0.2)

## 2024-05-20 LAB — I-STAT CHEM 8, ED
BUN: 124 mg/dL — ABNORMAL HIGH (ref 8–23)
Calcium, Ion: 1.1 mmol/L — ABNORMAL LOW (ref 1.15–1.40)
Chloride: 105 mmol/L (ref 98–111)
Creatinine, Ser: 3.9 mg/dL — ABNORMAL HIGH (ref 0.61–1.24)
Glucose, Bld: 391 mg/dL — ABNORMAL HIGH (ref 70–99)
HCT: 38 % — ABNORMAL LOW (ref 39.0–52.0)
Hemoglobin: 12.9 g/dL — ABNORMAL LOW (ref 13.0–17.0)
Potassium: 4.6 mmol/L (ref 3.5–5.1)
Sodium: 139 mmol/L (ref 135–145)
TCO2: 21 mmol/L — ABNORMAL LOW (ref 22–32)

## 2024-05-20 LAB — CBG MONITORING, ED
Glucose-Capillary: 425 mg/dL — ABNORMAL HIGH (ref 70–99)
Glucose-Capillary: 367 mg/dL — ABNORMAL HIGH (ref 70–99)
Glucose-Capillary: 262 mg/dL — ABNORMAL HIGH (ref 70–99)
Glucose-Capillary: 307 mg/dL — ABNORMAL HIGH (ref 70–99)

## 2024-05-20 LAB — COMPREHENSIVE METABOLIC PANEL WITH GFR
ALT: 11 U/L (ref 0–44)
AST: 24 U/L (ref 15–41)
Albumin: 3 g/dL — ABNORMAL LOW (ref 3.5–5.0)
Alkaline Phosphatase: 82 U/L (ref 38–126)
Anion gap: 24 — ABNORMAL HIGH (ref 5–15)
BUN: 108 mg/dL — ABNORMAL HIGH (ref 8–23)
CO2: 16 mmol/L — ABNORMAL LOW (ref 22–32)
Calcium: 10.4 mg/dL — ABNORMAL HIGH (ref 8.9–10.3)
Chloride: 103 mmol/L (ref 98–111)
Creatinine, Ser: 3.76 mg/dL — ABNORMAL HIGH (ref 0.61–1.24)
GFR, Estimated: 15 mL/min — ABNORMAL LOW (ref >60)
Glucose, Bld: 411 mg/dL — ABNORMAL HIGH (ref 70–99)
Potassium: 3.9 mmol/L (ref 3.5–5.1)
Sodium: 143 mmol/L (ref 135–145)
Total Bilirubin: 0.5 mg/dL (ref 0.0–1.2)
Total Protein: 7.1 g/dL (ref 6.5–8.1)

## 2024-05-20 LAB — URINALYSIS, W/ REFLEX TO CULTURE (INFECTION SUSPECTED)
Bacteria, UA: NONE SEEN
Bilirubin Urine: NEGATIVE
Glucose, UA: >=500 mg/dL — AB
Hgb urine dipstick: NEGATIVE
Ketones, ur: NEGATIVE mg/dL
Leukocytes,Ua: NEGATIVE
Nitrite: NEGATIVE
Protein, ur: 100 mg/dL — AB
Specific Gravity, Urine: 1.009 (ref 1.005–1.030)
pH: 5 (ref 5.0–8.0)

## 2024-05-20 LAB — OSMOLALITY: Osmolality: 360 mosm/kg (ref 275–295)

## 2024-05-20 LAB — I-STAT VENOUS BLOOD GAS, ED
Acid-base deficit: 3 mmol/L — ABNORMAL HIGH (ref 0.0–2.0)
Bicarbonate: 20.3 mmol/L (ref 20.0–28.0)
Calcium, Ion: 1.09 mmol/L — ABNORMAL LOW (ref 1.15–1.40)
HCT: 36 % — ABNORMAL LOW (ref 39.0–52.0)
Hemoglobin: 12.2 g/dL — ABNORMAL LOW (ref 13.0–17.0)
O2 Saturation: 94 %
Potassium: 4.7 mmol/L (ref 3.5–5.1)
Sodium: 138 mmol/L (ref 135–145)
TCO2: 21 mmol/L — ABNORMAL LOW (ref 22–32)
pCO2, Ven: 31.9 mmHg — ABNORMAL LOW (ref 44–60)
pH, Ven: 7.413 (ref 7.25–7.43)
pO2, Ven: 70 mmHg — ABNORMAL HIGH (ref 32–45)

## 2024-05-20 LAB — PROTIME-INR
INR: 1.2 (ref 0.8–1.2)
Prothrombin Time: 16.3 s — ABNORMAL HIGH (ref 11.4–15.2)

## 2024-05-20 LAB — I-STAT CG4 LACTIC ACID, ED
Lactic Acid, Venous: 7.8 mmol/L (ref 0.5–1.9)
Lactic Acid, Venous: 9.6 mmol/L (ref 0.5–1.9)
Lactic Acid, Venous: 7.9 mmol/L (ref 0.5–1.9)

## 2024-05-20 LAB — TROPONIN I (HIGH SENSITIVITY)
Troponin I (High Sensitivity): 37 ng/L — ABNORMAL HIGH (ref <18)
Troponin I (High Sensitivity): 45 ng/L — ABNORMAL HIGH (ref <18)

## 2024-05-20 LAB — MAGNESIUM: Magnesium: 3.6 mg/dL — ABNORMAL HIGH (ref 1.7–2.4)

## 2024-05-20 LAB — BETA-HYDROXYBUTYRIC ACID: Beta-Hydroxybutyric Acid: 0.25 mmol/L (ref 0.05–0.27)

## 2024-05-20 LAB — BRAIN NATRIURETIC PEPTIDE: B Natriuretic Peptide: 22.1 pg/mL (ref 0.0–100.0)

## 2024-05-20 MED ORDER — ACETAMINOPHEN 650 MG RE SUPP
650.0000 mg | Freq: Once | RECTAL | Status: AC
  Administered 2024-05-20: 650 mg via RECTAL
  Filled 2024-05-20: qty 1

## 2024-05-20 MED ORDER — LORAZEPAM 2 MG/ML IJ SOLN: 1.0000 mg | Freq: Once | INTRAMUSCULAR | Status: DC

## 2024-05-20 MED ORDER — LORAZEPAM 2 MG/ML IJ SOLN
1.0000 mg | Freq: Once | INTRAMUSCULAR | Status: AC
  Administered 2024-05-20: 1 mg via INTRAVENOUS
  Filled 2024-05-20: qty 1

## 2024-05-20 MED ORDER — INSULIN REGULAR(HUMAN) IN NACL 100-0.9 UT/100ML-% IV SOLN
INTRAVENOUS | Status: DC
  Administered 2024-05-20: 8.5 [IU]/h via INTRAVENOUS
  Filled 2024-05-20: qty 100

## 2024-05-20 MED ORDER — LACTATED RINGERS IV SOLN: INTRAVENOUS | Status: AC

## 2024-05-20 MED ORDER — LORAZEPAM 2 MG/ML IJ SOLN
2.0000 mg | Freq: Once | INTRAMUSCULAR | Status: AC
  Administered 2024-05-20: 2 mg via INTRAVENOUS
  Filled 2024-05-20: qty 1

## 2024-05-20 MED ORDER — NITROGLYCERIN IN D5W 200-5 MCG/ML-% IV SOLN
0.0000 ug/min | INTRAVENOUS | Status: DC
  Administered 2024-05-20: 50 ug/min via INTRAVENOUS
  Filled 2024-05-20: qty 250

## 2024-05-20 MED ORDER — LACTATED RINGERS IV BOLUS
1000.0000 mL | Freq: Once | INTRAVENOUS | Status: AC
  Administered 2024-05-20: 1000 mL via INTRAVENOUS

## 2024-05-20 MED ORDER — VANCOMYCIN HCL IN DEXTROSE 1-5 GM/200ML-% IV SOLN
1000.0000 mg | Freq: Once | INTRAVENOUS | Status: AC
  Administered 2024-05-20: 1000 mg via INTRAVENOUS
  Filled 2024-05-20: qty 200

## 2024-05-20 MED ORDER — LACTATED RINGERS IV BOLUS (SEPSIS)
1000.0000 mL | Freq: Once | INTRAVENOUS | Status: AC
  Administered 2024-05-20: 1000 mL via INTRAVENOUS

## 2024-05-20 MED ORDER — DEXTROSE 50 % IV SOLN: 0.0000 mL | INTRAVENOUS | Status: DC | PRN

## 2024-05-20 MED ORDER — LORAZEPAM 2 MG/ML IJ SOLN: 0.5000 mg | Freq: Once | INTRAMUSCULAR | Status: DC

## 2024-05-20 MED ORDER — LEVETIRACETAM (KEPPRA) 500 MG/5 ML ADULT IV PUSH
1000.0000 mg | Freq: Once | INTRAVENOUS | Status: AC
  Administered 2024-05-20: 1000 mg via INTRAVENOUS
  Filled 2024-05-20: qty 10

## 2024-05-20 MED ORDER — METRONIDAZOLE 500 MG/100ML IV SOLN
500.0000 mg | Freq: Once | INTRAVENOUS | Status: AC
  Administered 2024-05-20: 500 mg via INTRAVENOUS
  Filled 2024-05-20: qty 100

## 2024-05-20 MED ORDER — DIATRIZOATE MEGLUMINE & SODIUM 66-10 % PO SOLN
90.0000 mL | Freq: Once | ORAL | Status: DC
  Filled 2024-05-20: qty 90

## 2024-05-20 MED ORDER — POTASSIUM CHLORIDE 10 MEQ/100ML IV SOLN
10.0000 meq | INTRAVENOUS | Status: AC
  Administered 2024-05-20 (×2): 10 meq via INTRAVENOUS
  Filled 2024-05-20 (×2): qty 100

## 2024-05-20 MED ORDER — DEXTROSE IN LACTATED RINGERS 5 % IV SOLN: INTRAVENOUS | Status: DC

## 2024-05-20 MED ORDER — SODIUM CHLORIDE 0.9 % IV SOLN
2.0000 g | Freq: Once | INTRAVENOUS | Status: AC
  Administered 2024-05-20: 2 g via INTRAVENOUS
  Filled 2024-05-20: qty 12.5

## 2024-05-20 MED ORDER — LEVETIRACETAM (KEPPRA) 500 MG/5 ML ADULT IV PUSH
20.0000 mg/kg | Freq: Once | INTRAVENOUS | Status: AC
  Administered 2024-05-20: 1250 mg via INTRAVENOUS
  Filled 2024-05-20: qty 15

## 2024-05-20 NOTE — Consult Note (Incomplete)
 NEUROLOGY CONSULT NOTE   Date of service: May 20, 2024 Patient Name: Jonathan Baird MRN:  [id number] DOB:  31-May-1933 Chief Complaint: *** Requesting Provider: Melvenia Motto, MD  History of Present Illness  Jonathan Baird is a 89 y.o. male with hx of ***      ROS  ***Comprehensive ROS performed and pertinent positives documented in HPI  ***Unable to ascertain due to ***  Past History   Past Medical History:  Diagnosis Date  . Carotid artery occlusion   . Colon cancer (HCC) 10/12/2004   stage III  . Diabetes mellitus     Past Surgical History:  Procedure Laterality Date  . hemocolectomy  2005    Family History: Family History  Problem Relation Age of Onset  . Cancer Mother   . Diabetes Father   . Cancer Sister     Social History  reports that he has never smoked. He has never used smokeless tobacco. He reports that he does not drink alcohol and does not use drugs.  No Known Allergies  Medications   Current Facility-Administered Medications:  .  dextrose  5 % in lactated ringers  infusion, , Intravenous, Continuous, Dixon, Ryan, MD .  dextrose  50 % solution 0-50 mL, 0-50 mL, Intravenous, PRN, Melvenia Motto, MD .  insulin  regular, human (MYXREDLIN ) 100 units/ 100 mL infusion, , Intravenous, Continuous, Dixon, Ryan, MD, Last Rate: 8.5 mL/hr at 05/20/24 2010, 8.5 Units/hr at 05/20/24 2010 .  lactated ringers  infusion, , Intravenous, Continuous, Melvenia Motto, MD .  nitroGLYCERIN  50 mg in dextrose  5 % 250 mL (0.2 mg/mL) infusion, 0-200 mcg/min, Intravenous, Continuous, Melvenia Motto, MD, Paused at 05/20/24 2121 .  potassium chloride  10 mEq in 100 mL IVPB, 10 mEq, Intravenous, Q1H, Melvenia Motto, MD, Last Rate: 100 mL/hr at 05/20/24 2114, 10 mEq at 05/20/24 2114  Current Outpatient Medications:  .  amLODipine  (NORVASC ) 10 MG tablet, Take 10 mg by mouth daily., Disp: , Rfl: 3 .  aspirin  (ASPIRIN  81) 81 MG chewable tablet, 1 tablet, Disp: , Rfl:  .  calcitRIOL (ROCALTROL)  0.25 MCG capsule, Take 0.25 mcg by mouth daily., Disp: , Rfl:  .  ferrous sulfate  325 (65 FE) MG tablet, Take 1 tablet (325 mg total) by mouth daily with breakfast., Disp: 30 tablet, Rfl: 1 .  GVOKE HYPOPEN 2-PACK 1 MG/0.2ML SOAJ, Inject 1 mg into the skin once as needed (for hypoglycemia)., Disp: , Rfl:  .  hydrALAZINE  (APRESOLINE ) 50 MG tablet, Take 50 mg by mouth 3 (three) times daily., Disp: , Rfl:  .  hydrochlorothiazide (HYDRODIURIL) 25 MG tablet, Take 25 mg by mouth daily., Disp: , Rfl:  .  insulin  lispro (HUMALOG KWIKPEN) 100 UNIT/ML KwikPen, Inject 4 Units into the skin 3 (three) times daily after meals., Disp: , Rfl:  .  lisinopril (PRINIVIL,ZESTRIL) 40 MG tablet, Take 40 mg by mouth daily., Disp: , Rfl:  .  rosuvastatin  (CRESTOR ) 10 MG tablet, Take 10 mg by mouth daily., Disp: , Rfl:  .  sodium bicarbonate  650 MG tablet, Take 650 mg by mouth daily., Disp: , Rfl: 5 .  tamsulosin  (FLOMAX ) 0.4 MG CAPS capsule, Take 1 capsule (0.4 mg total) by mouth daily after breakfast., Disp: 90 capsule, Rfl: 0 .  TRULICITY  1.5 MG/0.5ML SOAJ, Inject into the skin., Disp: , Rfl:   Vitals   Vitals:   05/20/24 2000 05/20/24 2015 05/20/24 2117 05/20/24 2120  BP:  (!) 221/105 (!) 89/47 111/62  Pulse:  95 (!) 116 (!) 103  Resp:  19 20 (!) 24 19  Temp:      TempSrc:      SpO2:  90% 100% 100%    There is no height or weight on file to calculate BMI.   Physical Exam   Constitutional: Appears mildly cachectic.  HENT: No OP obstruction.  Head: Normocephalic.  Respiratory: While asleep and snoring, respiratory effort is normal, with non-labored breathing.  Skin: No lesions to face, arms or lower legs  Neurologic Examination   ***  Labs/Imaging/Neurodiagnostic studies   CBC:  Recent Labs  Lab 2024-06-09 1830 06/09/24 1957  WBC  --  11.4*  NEUTROABS  --  10.5*  HGB 12.2*  12.9* 11.3*  HCT 36.0*  38.0* 34.8*  MCV  --  79.8*  PLT  --  320   Basic Metabolic Panel:  Lab Results  Component  Value Date   NA 138 June 09, 2024   NA 139 06-09-24   K 4.7 Jun 09, 2024   K 4.6 06-09-2024   CO2 16 (L) 06/09/24   GLUCOSE 391 (H) 2024/06/09   BUN 124 (H) 06/09/24   CREATININE 3.90 (H) 06/09/2024   CALCIUM  10.4 (H) June 09, 2024   GFRNONAA 15 (L) 06-09-2024   Lipid Panel:  Lab Results  Component Value Date   LDLCALC 111 (H) 11/13/2006   HgbA1c:  Lab Results  Component Value Date   HGBA1C 8.2 (H) 10/20/2023   Urine Drug Screen:     Component Value Date/Time   LABOPIA NONE DETECTED 04/26/2022 1710   COCAINSCRNUR NONE DETECTED 04/26/2022 1710   LABBENZ NONE DETECTED 04/26/2022 1710   AMPHETMU NONE DETECTED 04/26/2022 1710   THCU NONE DETECTED 04/26/2022 1710   LABBARB NONE DETECTED 04/26/2022 1710    Alcohol Level No results found for: [facility] INR  Lab Results  Component Value Date   INR 1.2 2024-06-09   APTT  Lab Results  Component Value Date   APTT 32 04/26/2022     ASSESSMENT   Jonathan Baird is a 89 y.o. male *** - Exam reveals *** - Spot EEG preliminary review reveals diffuse slowing without electrographic seizures.   RECOMMENDATIONS  *** ______________________________________________________________________    Bonney SHARK, Elye Harmsen, MD [facility]

## 2024-05-20 NOTE — Sepsis Progress Note (Addendum)
 Notified provider of need to order 3rd lactic acid.   2228: Notified bedside nurse of need to draw lactic acid.

## 2024-05-20 NOTE — Progress Notes (Signed)
 ED staff able to establish 2nd access. Will consult IV team if further assistance is needed.

## 2024-05-20 NOTE — ED Notes (Signed)
 Warms blankets applied, room temp increased.

## 2024-05-20 NOTE — ED Notes (Signed)
 Patient NG tube hooked up to suction upon xray confirmation. Within 1 minute of being hooked up to suction contained filled green and watery in character.

## 2024-05-20 NOTE — ED Provider Notes (Signed)
 [place] EMERGENCY DEPARTMENT AT [facility] Provider Note   CSN: [id number] Arrival date & time: 05/20/24  1729     Patient presents with: Respiratory Distress   Jonathan Baird is a 89 y.o. male.   HPI Patient presents for respiratory distress.  Medical history includes DM, HTN, CKD, dementia, colon cancer.  Per EMS, he was in his normal state of health last night.  Family noticed that he was having shortness of breath today.  They describe a wet cough.  When EMS arrived on scene, patient's SpO2 was in the 60s on room air.  He does not wear oxygen at baseline.  He was noted to have coarse breath sounds.  He was given a DuoNeb.  On DuoNeb, SpO2 improved to the high 80s.  On arrival, patient unable to provide any history.  History per daughter: Patient has dementia at baseline.  For the past several months, he has been less conversant than normal.  He is typically ambulatory with minimal assistance.  Over the past week, he has seemed weak.  They discussed this with their primary care doctor and he was started on antibiotic for treatment of a presumed UTI.  Despite antibiotics, his weakness worsened.  For the past 3 nights he has slept in a chair because he was too weak to stand up.  Last night, a family member tried to get him up and patient was minimally responsive.  This morning, he had onset of labored breathing.    Prior to Admission medications   Medication Sig Start Date End Date Taking? Authorizing Provider  amLODipine  (NORVASC ) 10 MG tablet Take 10 mg by mouth daily. 03/05/18   [provider]  aspirin  (ASPIRIN  81) 81 MG chewable tablet 1 tablet    [provider]  calcitRIOL (ROCALTROL) 0.25 MCG capsule Take 0.25 mcg by mouth daily. 11/02/23   [provider]  ferrous sulfate  325 (65 FE) MG tablet Take 1 tablet (325 mg total) by mouth daily with breakfast. 10/26/23   Vann, Jessica U, DO  GVOKE HYPOPEN 2-PACK 1 MG/0.2ML SOAJ Inject 1 mg into the  skin once as needed (for hypoglycemia). 02/08/22   [provider]  hydrALAZINE  (APRESOLINE ) 50 MG tablet Take 50 mg by mouth 3 (three) times daily. 12/03/23   [provider]  hydrochlorothiazide (HYDRODIURIL) 25 MG tablet Take 25 mg by mouth daily. 11/04/23   [provider]  insulin  lispro (HUMALOG KWIKPEN) 100 UNIT/ML KwikPen Inject 4 Units into the skin 3 (three) times daily after meals.    [provider]  lisinopril (PRINIVIL,ZESTRIL) 40 MG tablet Take 40 mg by mouth daily.    [provider]  rosuvastatin  (CRESTOR ) 10 MG tablet Take 10 mg by mouth daily.    [provider]  sodium bicarbonate  650 MG tablet Take 650 mg by mouth daily. 07/16/15   [provider]  tamsulosin  (FLOMAX ) 0.4 MG CAPS capsule Take 1 capsule (0.4 mg total) by mouth daily after breakfast. 01/01/24   Raenelle Coria, MD  TRULICITY  1.5 MG/0.5ML SOAJ Inject into the skin. 12/04/23   [provider]    Allergies: Patient has no known allergies.    Review of Systems  Unable to perform ROS: Mental status change    Updated Vital Signs BP 116/70   Pulse 67   Temp (!) 96.4 F (35.8 C) (Axillary)   Resp (!) 28   SpO2 92%   Physical Exam Vitals and nursing note reviewed.  Constitutional:  Appearance: Normal appearance. He is well-developed. He is ill-appearing. He is not toxic-appearing or diaphoretic.  HENT:     Head: Normocephalic and atraumatic.     Right Ear: External ear normal.     Left Ear: External ear normal.     Nose: Nose normal.     Mouth/Throat:     Mouth: Mucous membranes are moist.  Eyes:     Conjunctiva/sclera: Conjunctivae normal.     Pupils: Pupils are equal, round, and reactive to light.  Cardiovascular:     Rate and Rhythm: Normal rate and regular rhythm.     Heart sounds: No murmur heard. Pulmonary:     Effort: Tachypnea, accessory muscle usage and respiratory distress present.     Breath sounds: Rhonchi and rales  present.  Abdominal:     General: There is no distension.     Palpations: Abdomen is soft.     Tenderness: There is no abdominal tenderness.  Musculoskeletal:        General: No swelling or deformity.     Cervical back: Normal range of motion and neck supple.     Right lower leg: No edema.     Left lower leg: No edema.  Skin:    General: Skin is warm and dry.     Coloration: Skin is not jaundiced or pale.  Neurological:     General: No focal deficit present.     GCS: GCS eye subscore is 3. GCS verbal subscore is 1. GCS motor subscore is 6.     (all labs ordered are listed, but only abnormal results are displayed) Labs Reviewed  COMPREHENSIVE METABOLIC PANEL WITH GFR - Abnormal; Notable for the following components:      Result Value   CO2 16 (*)    Glucose, Bld 411 (*)    BUN 108 (*)    Creatinine, Ser 3.76 (*)    Calcium  10.4 (*)    Albumin 3.0 (*)    GFR, Estimated 15 (*)    Anion gap 24 (*)    All other components within normal limits  PROTIME-INR - Abnormal; Notable for the following components:   Prothrombin Time 16.3 (*)    All other components within normal limits  URINALYSIS, W/ REFLEX TO CULTURE (INFECTION SUSPECTED) - Abnormal; Notable for the following components:   Glucose, UA >=500 (*)    Protein, ur 100 (*)    All other components within normal limits  MAGNESIUM  - Abnormal; Notable for the following components:   Magnesium  3.6 (*)    All other components within normal limits  OSMOLALITY - Abnormal; Notable for the following components:   Osmolality 360 (*)    All other components within normal limits  CBC WITH DIFFERENTIAL/PLATELET - Abnormal; Notable for the following components:   WBC 11.4 (*)    Hemoglobin 11.3 (*)    HCT 34.8 (*)    MCV 79.8 (*)    MCH 25.9 (*)    Neutro Abs 10.5 (*)    Lymphs Abs 0.5 (*)    All other components within normal limits  I-STAT CG4 LACTIC ACID, ED - Abnormal; Notable for the following components:   Lactic Acid,  Venous 7.8 (*)    All other components within normal limits  I-STAT VENOUS BLOOD GAS, ED - Abnormal; Notable for the following components:   pCO2, Ven 31.9 (*)    pO2, Ven 70 (*)    TCO2 21 (*)    Acid-base deficit 3.0 (*)  Calcium , Ion 1.09 (*)    HCT 36.0 (*)    Hemoglobin 12.2 (*)    All other components within normal limits  I-STAT CHEM 8, ED - Abnormal; Notable for the following components:   BUN 124 (*)    Creatinine, Ser 3.90 (*)    Glucose, Bld 391 (*)    Calcium , Ion 1.10 (*)    TCO2 21 (*)    Hemoglobin 12.9 (*)    HCT 38.0 (*)    All other components within normal limits  I-STAT CG4 LACTIC ACID, ED - Abnormal; Notable for the following components:   Lactic Acid, Venous 9.6 (*)    All other components within normal limits  CBG MONITORING, ED - Abnormal; Notable for the following components:   Glucose-Capillary 425 (*)    All other components within normal limits  I-STAT CG4 LACTIC ACID, ED - Abnormal; Notable for the following components:   Lactic Acid, Venous 7.9 (*)    All other components within normal limits  CBG MONITORING, ED - Abnormal; Notable for the following components:   Glucose-Capillary 367 (*)    All other components within normal limits  CBG MONITORING, ED - Abnormal; Notable for the following components:   Glucose-Capillary 262 (*)    All other components within normal limits  CBG MONITORING, ED - Abnormal; Notable for the following components:   Glucose-Capillary 307 (*)    All other components within normal limits  TROPONIN I (HIGH SENSITIVITY) - Abnormal; Notable for the following components:   Troponin I (High Sensitivity) 37 (*)    All other components within normal limits  TROPONIN I (HIGH SENSITIVITY) - Abnormal; Notable for the following components:   Troponin I (High Sensitivity) 45 (*)    All other components within normal limits  RESP PANEL BY RT-PCR (RSV, FLU A&B, COVID)  RVPGX2  CULTURE, BLOOD (ROUTINE X 2)  CULTURE, BLOOD  (ROUTINE X 2)  BETA-HYDROXYBUTYRIC ACID  BRAIN NATRIURETIC PEPTIDE  CBC WITH DIFFERENTIAL/PLATELET  BASIC METABOLIC PANEL WITH GFR  I-STAT CG4 LACTIC ACID, ED    EKG: EKG Interpretation Date/Time:  Wednesday May 20 2024 17:35:58 EDT Ventricular Rate:  111 PR Interval:  164 QRS Duration:  88 QT Interval:  367 QTC Calculation: 499 R Axis:   83  Text Interpretation: Sinus tachycardia Multiple premature complexes, vent & supraven Right atrial enlargement Borderline right axis deviation Consider left ventricular hypertrophy Repol abnrm, severe global ischemia (LM/MVD) Confirmed by Melvenia Motto ([id number]) on 05/20/2024 6:00:25 PM  Radiology: [facility] Abd Portable 1V-Small Bowel Protocol-Position Verification Result Date: 05/20/2024 CLINICAL DATA:  NG tube placement confirmation EXAM: PORTABLE ABDOMEN - 1 VIEW COMPARISON:  05/20/2024 FINDINGS: Limited field of view for tube placement verification purposes. The enteric tube has been advanced with tip now projecting over the left upper quadrant consistent with location in the body of the stomach. Mild gaseous distention of the stomach. IMPRESSION: Enteric tube has been advanced with tip now projecting over the left upper quadrant consistent with location in the body of the stomach. Electronically Signed   By: Elsie Gravely M.D.   On: 05/20/2024 23:00   DG Abd Portable 1 View Result Date: 05/20/2024 CLINICAL DATA:  NG tube placement EXAM: PORTABLE ABDOMEN - 1 VIEW COMPARISON:  CT 05/20/2024 FINDINGS: Enteric tube tip folded back upon itself and directed cranially in the region of the distal esophagus. Heterogeneous airspace disease at the lung bases. Moderate air distension of the stomach IMPRESSION: Enteric tube tip folded back upon itself and  directed cranially in the region of the distal esophagus. Recommend repositioning. Electronically Signed   By: Luke Bun M.D.   On: 05/20/2024 22:13   CT Head Wo Contrast Result Date: 05/20/2024 CLINICAL DATA:   Mental status changes, unknown cause. EXAM: CT HEAD WITHOUT CONTRAST TECHNIQUE: Contiguous axial images were obtained from the base of the skull through the vertex without intravenous contrast. RADIATION DOSE REDUCTION: This exam was performed according to the departmental dose-optimization program which includes automated exposure control, adjustment of the mA and/or kV according to patient size and/or use of iterative reconstruction technique. COMPARISON:  Head CT 12/31/2023. FINDINGS: Brain: There is mild cerebellar volume loss, small chronic right superior cerebellar lacunar infarct. There is moderately advanced cerebral atrophy and atrophic ventriculomegaly with moderate to severe small vessel disease of cerebral white matter. Chronic dystrophic calcification again noted in the frontal falx, senescent mineralization in the basal ganglia. No acute cortical based infarct, hemorrhage, mass or mass effect is seen. There is no midline shift.  Basal cisterns are patent. Vascular: There are calcifications in the distal vertebral arteries and heavily in the siphons. No hyperdense central vessels. Skull: Negative for fractures or focal lesions. Sinuses/Orbits: No acute findings. Old lens replacements with otherwise negative orbits. Other: None. IMPRESSION: 1. No acute intracranial CT findings or interval changes. 2. Atrophy and small-vessel disease with atrophic ventriculomegaly. Stable exam. 3. Atherosclerosis. Electronically Signed   By: Francis Quam M.D.   On: 05/20/2024 21:49   CT CHEST ABDOMEN PELVIS WO CONTRAST Addendum Date: 05/20/2024 ADDENDUM REPORT: 05/20/2024 21:28 ADDENDUM: Critical Value/emergent results were called by telephone at the time of interpretation on 05/20/2024 at 9:27 pm to provider BERNARDINO FIREMAN , who verbally acknowledged these results. Electronically Signed   By: Leita Birmingham M.D.   On: 05/20/2024 21:28   Result Date: 05/20/2024 CLINICAL DATA:  Sepsis. EXAM: CT CHEST, ABDOMEN AND PELVIS  WITHOUT CONTRAST TECHNIQUE: Multidetector CT imaging of the chest, abdomen and pelvis was performed following the standard protocol without IV contrast. RADIATION DOSE REDUCTION: This exam was performed according to the departmental dose-optimization program which includes automated exposure control, adjustment of the mA and/or kV according to patient size and/or use of iterative reconstruction technique. COMPARISON:  09/17/2007. FINDINGS: CT CHEST FINDINGS Cardiovascular: The heart is normal in size and there is a trace pericardial effusion. Three-vessel coronary artery calcifications are noted. There is atherosclerotic calcification of the aorta without evidence of aneurysm. The pulmonary trunk is normal in caliber. Mediastinum/Nodes: No mediastinal or axillary lymphadenopathy. The thyroid  gland and trachea are within normal limits. Esophagus is mildly distended with fluid. Lungs/Pleura: Scattered airspace opacities are noted in the right lung with consolidation in the right upper and lower lobes. There is consolidation in the left lower lobe with mild airspace disease in the left upper lobe. There is a 3 mm nodule in the left upper lobe, axial image 38. No effusion or pneumothorax is seen. Musculoskeletal: Gynecomastia is noted. Degenerative changes are present in the thoracic spine. No acute osseous abnormality. CT ABDOMEN PELVIS FINDINGS Hepatobiliary: No focal liver abnormality is seen. No gallstones, gallbladder wall thickening, or biliary dilatation. Pancreas: There is a vague hypodensity in the head of the pancreas measuring 1.8 x 1.2 cm. No pancreatic ductal dilatation or surrounding inflammatory changes. Spleen: Normal in size without focal abnormality. Adrenals/Urinary Tract: The adrenal glands are within normal limits. No renal calculus or hydronephrosis bilaterally. A Foley catheter is present in the urinary bladder. Stomach/Bowel: The stomach is distended with fluid. Multiple  dilated loops of small  bowel are present in the abdomen measuring up to 4.4 cm in diameter. A suspected transition point is seen in the mid pelvis, sagittal image 71, coronal image 74. There is a left inguinal hernia containing nonobstructed small bowel. No free air or pneumatosis is seen. A moderate amount of retained stool is noted in the rectum. Surgical changes are present at the cecum and the appendix is not seen. Vascular/Lymphatic: Aortic atherosclerosis. No enlarged abdominal or pelvic lymph nodes. Reproductive: The prostate gland is enlarged. Other: No abdominopelvic ascites. There is a left inguinal hernia containing nonobstructed small bowel. Musculoskeletal: Degenerative changes are present in the lumbar spine. No acute osseous abnormality is seen. IMPRESSION: 1. Multiple dilated loops of small bowel in the abdomen with a suspected transition point in the mid pelvis, concerning for small bowel obstruction. Surgical consultation is recommended. 2. Scattered airspace disease in the lungs bilaterally with consolidation in the right upper and lower lobes and left lower lobe, suggesting pneumonia. 3. Vague hypodensity in the head of the pancreas measuring 1.8 cm. Nonemergent MRI is recommended for further characterization. 4. Multi-vessel coronary artery calcifications. 5. Aortic atherosclerosis. Electronically Signed: By: Leita Birmingham M.D. On: 05/20/2024 21:15   DG Chest Port 1 View Result Date: 05/20/2024 CLINICAL DATA:  Possible sepsis.  Respiratory distress. EXAM: PORTABLE CHEST 1 VIEW COMPARISON:  12/31/2023. FINDINGS: The heart size and mediastinal contours are within normal limits. There is atherosclerotic calcification of the aorta. Scattered airspace disease is noted in the perihilar region on the right and at the left lung base. No effusion or pneumothorax is seen. No acute osseous abnormality. IMPRESSION: Scattered airspace disease in the perihilar region on the right and at the left lung base, concerning for  pneumonia. Electronically Signed   By: Leita Birmingham M.D.   On: 05/20/2024 19:58     Procedures   Medications Ordered in the ED  insulin  regular, human (MYXREDLIN ) 100 units/ 100 mL infusion (7.5 Units/hr Intravenous Infusion Verify 05/20/24 2301)  lactated ringers  infusion ( Intravenous New Bag/Given 05/20/24 2246)  dextrose  5 % in lactated ringers  infusion (has no administration in time range)  dextrose  50 % solution 0-50 mL (has no administration in time range)  nitroGLYCERIN  50 mg in dextrose  5 % 250 mL (0.2 mg/mL) infusion (0 mcg/min Intravenous Stopped 05/20/24 2121)  diatrizoate  meglumine -sodium (GASTROGRAFIN ) 66-10 % solution 90 mL (has no administration in time range)  lactated ringers  bolus 1,000 mL (0 mLs Intravenous Stopped 05/20/24 1954)  ceFEPIme  (MAXIPIME ) 2 g in sodium chloride  0.9 % 100 mL IVPB (0 g Intravenous Stopped 05/20/24 1853)  metroNIDAZOLE  (FLAGYL ) IVPB 500 mg (0 mg Intravenous Stopped 05/20/24 1954)  vancomycin  (VANCOCIN ) IVPB 1000 mg/200 mL premix (0 mg Intravenous Stopped 05/20/24 2007)  acetaminophen  (TYLENOL ) suppository 650 mg (650 mg Rectal Given 05/20/24 1846)  lactated ringers  bolus 1,000 mL (0 mLs Intravenous Stopped 05/20/24 2101)  potassium chloride  10 mEq in 100 mL IVPB (0 mEq Intravenous Stopped 05/20/24 2216)  lactated ringers  bolus 1,000 mL (0 mLs Intravenous Stopped 05/20/24 2235)  levETIRAcetam  (KEPPRA ) undiluted injection 1,250 mg (1,250 mg Intravenous Given 05/20/24 2103)  LORazepam  (ATIVAN ) injection 1 mg (1 mg Intravenous Given 05/20/24 2102)  LORazepam  (ATIVAN ) injection 2 mg (2 mg Intravenous Given 05/20/24 2125)  levETIRAcetam  (KEPPRA ) undiluted injection 1,000 mg (1,000 mg Intravenous Given 05/20/24 2129)  lactated ringers  bolus 1,000 mL (1,000 mLs Intravenous New Bag/Given 05/20/24 2345)  Medical Decision Making Amount and/or Complexity of Data Reviewed Labs: ordered. Radiology: ordered.  Risk OTC drugs. Prescription drug  management. Decision regarding hospitalization.   This patient presents to the ED for concern of difficulty breathing, this involves an extensive number of treatment options, and is a complaint that carries with it a high risk of complications and morbidity.  The differential diagnosis includes pneumonia, CHF, reactive airway disease, acidosis, anemia, metabolic derangements   Co morbidities / Chronic conditions that complicate the patient evaluation  DM, HTN, CKD, dementia, colon cancer   Additional history obtained:  Additional history obtained from EMR External records from outside source obtained and reviewed including patient's daughter   Lab Tests:  I Ordered, and personally interpreted labs.  The pertinent results include: AKI is present.  Azotemia may be contributing to his altered mental status.  Leukocytosis and lactic acidosis are present consistent with sepsis.  Elevated lactate raises concern for ischemia or possible seizures.  He has hyperglycemia and elevated osmolality.  This can be seen with HHS, azotemia likely contributing to his hyperosmolality.   Imaging Studies ordered:  I ordered imaging studies including chest x-ray, CT of head, chest, abdomen, pelvis I independently visualized and interpreted imaging which showed no acute findings on CT head; imaging of chest is consistent with multifocal pneumonia.  Imaging of abdomen and pelvis is consistent with small bowel obstruction. I agree with the radiologist interpretation   Cardiac Monitoring: / EKG:  The patient was maintained on a cardiac monitor.  I personally viewed and interpreted the cardiac monitored which showed an underlying rhythm of: Sinus rhythm   Problem List / ED Course / Critical interventions / Medication management  Patient presenting for difficulty breathing.  Per family, he has had ongoing and worsening generalized weakness over the past week despite empiric treatment for UTI.  He has been  unable to walk for the past 3 days.  He was minimally responsive yesterday and breathing difficulty started today.  On arrival in the ED, patient is tachycardic and tachypneic.  He does meet sepsis criteria.  IV fluids and antibiotics were ordered.  Patient required FiO2 of 80% on BiPAP initially.  His x-ray does show evidence of multifocal pneumonia.  He did have improvement of tachypnea and was able to be weaned down to 40%.  He was then able to be taken off of BiPAP and maintain normal SpO2 on room air.  Initial lab work notable for AKI, azotemia, hyperglycemia, and markedly elevated lactic acid.  While in the ED, he was having rigidity in his upper extremities with involuntary movements of his right upper extremity.  Right upper extremity would flex.  When he was straining, he would flex again.  This raises concern of seizures.  Ativan  and Keppra  were ordered.  Nursing did note gaze deviation.  At the time, he was having markedly elevated blood pressures.  He was briefly started on nitroglycerin  gtt. but pressure subsequently resolved.  I suspect that his blood pressures were reading falsely high given his upper extremity rigidity.  NTG was discontinued.  He did have resolution of seizure-like activity.  I spoke with neurology who did see the patient in consult and did order EEG.  On CT imaging, there were no acute findings identified on CT of head.  CT of abdomen pelvis showed small bowel obstruction.  Surgery was consulted.  NG tube was placed.  Surgery requested SBO protocol x-ray imaging.  This was ordered.  Patient had 2 L of bilious output  from NG tube.  Vital signs at this time remained stable.  SpO2 remains normal on room air.  I spoke with hospitalist, Dr. Segars, who will evaluate patient. I ordered medication including IV fluids and broad-spectrum antibiotics for empiric treatment of sepsis; insulin  GTT for possible HHS; additional IV fluids for hyperglycemia and lactic acidosis; Ativan  and Keppra   for seizure-like activity Reevaluation of the patient after these medicines showed that the patient improved I have reviewed the patients home medicines and have made adjustments as needed   Consultations Obtained:  I requested consultation with the neurologist, Dr. Merrianne,  and discussed lab and imaging findings as well as pertinent plan - they recommend: Recommendations pending.  EEG was ordered. I requested consultation with the general surgeon, Dr. Rubin,  and discussed lab and imaging findings as well as pertinent plan - they recommend: SBO protocol imaging.  Will see in consult.   Social Determinants of Health:  Lives at home with family, has dementia at baseline  CRITICAL CARE Performed by: Bernardino Fireman   Total critical care time: 65 minutes  Critical care time was exclusive of separately billable procedures and treating other patients.  Critical care was necessary to treat or prevent imminent or life-threatening deterioration.  Critical care was time spent personally by me on the following activities: development of treatment plan with patient and/or surrogate as well as nursing, discussions with consultants, evaluation of patient's response to treatment, examination of patient, obtaining history from patient or surrogate, ordering and performing treatments and interventions, ordering and review of laboratory studies, ordering and review of radiographic studies, pulse oximetry and re-evaluation of patient's condition.      Final diagnoses:  Small bowel obstruction (HCC)  Acute respiratory failure with hypoxia (HCC)  Glasgow coma scale total score 9-12, in the field (EMT or ambulance)  Multifocal pneumonia  Seizure-like activity [facility])    ED Discharge Orders     None          Fireman Bernardino, MD 05/20/24 2355

## 2024-05-20 NOTE — Consult Note (Incomplete)
 NEUROLOGY CONSULT NOTE   Date of service: May 20, 2024 Patient Name: Jonathan Baird MRN:  [id number] DOB:  [birth year] Chief Complaint: Possible seizure Requesting Provider: Melvenia Motto, MD  History of Present Illness  Jonathan Baird is a 89 y.o. male with a PMHx of colon cancer s/p hemicolectomy, DM, CKD, HTN, dementia and prior carotid artery occlusion who presents to the ED with respiratory distress. He is ambulatory at baseline but seemed weak over the past week, having to sleep in a chair for the past 3 nights due to not being strong enough to stand up. On Tuesday night he was minimally responsive, per family. This morning, he had onset of labored breathing with a wet cough. On EMS arrival his O2 saturation was 64% on RA, BP 150/90, HR 130. O2 sats improved to 89% with nebulizer and 4L O2 by cannula. CBG was 468.   Regarding his dementia, daughter stated that for the past several months he had been less conversant than normal.     ROS  Unable to obtain due to obtundation.   Past History   Past Medical History:  Diagnosis Date   Carotid artery occlusion    Colon cancer (HCC) 10/12/2004   stage III   Diabetes mellitus     Past Surgical History:  Procedure Laterality Date   hemocolectomy  2005    Family History: Family History  Problem Relation Age of Onset   Cancer Mother    Diabetes Father    Cancer Sister     Social History  reports that he has never smoked. He has never used smokeless tobacco. He reports that he does not drink alcohol and does not use drugs.  No Known Allergies  Medications   Current Facility-Administered Medications:    dextrose  5 % in lactated ringers  infusion, , Intravenous, Continuous, Jonathan Baird, Ryan, MD   dextrose  50 % solution 0-50 mL, 0-50 mL, Intravenous, PRN, Melvenia Motto, MD   insulin  regular, human (MYXREDLIN ) 100 units/ 100 mL infusion, , Intravenous, Continuous, Jonathan Baird, Ryan, MD, Last Rate: 8.5 mL/hr at 05/20/24 2010, 8.5 Units/hr at  05/20/24 2010   lactated ringers  infusion, , Intravenous, Continuous, Melvenia Motto, MD   nitroGLYCERIN  50 mg in dextrose  5 % 250 mL (0.2 mg/mL) infusion, 0-200 mcg/min, Intravenous, Continuous, Melvenia Motto, MD, Paused at 05/20/24 2121   potassium chloride  10 mEq in 100 mL IVPB, 10 mEq, Intravenous, Q1H, Melvenia Motto, MD, Last Rate: 100 mL/hr at 05/20/24 2114, 10 mEq at 05/20/24 2114  Current Outpatient Medications:    amLODipine  (NORVASC ) 10 MG tablet, Take 10 mg by mouth daily., Disp: , Rfl: 3   aspirin  (ASPIRIN  81) 81 MG chewable tablet, 1 tablet, Disp: , Rfl:    calcitRIOL (ROCALTROL) 0.25 MCG capsule, Take 0.25 mcg by mouth daily., Disp: , Rfl:    ferrous sulfate  325 (65 FE) MG tablet, Take 1 tablet (325 mg total) by mouth daily with breakfast., Disp: 30 tablet, Rfl: 1   GVOKE HYPOPEN 2-PACK 1 MG/0.2ML SOAJ, Inject 1 mg into the skin once as needed (for hypoglycemia)., Disp: , Rfl:    hydrALAZINE  (APRESOLINE ) 50 MG tablet, Take 50 mg by mouth 3 (three) times daily., Disp: , Rfl:    hydrochlorothiazide (HYDRODIURIL) 25 MG tablet, Take 25 mg by mouth daily., Disp: , Rfl:    insulin  lispro (HUMALOG KWIKPEN) 100 UNIT/ML KwikPen, Inject 4 Units into the skin 3 (three) times daily after meals., Disp: , Rfl:    lisinopril (PRINIVIL,ZESTRIL) 40 MG tablet, Take  40 mg by mouth daily., Disp: , Rfl:    rosuvastatin  (CRESTOR ) 10 MG tablet, Take 10 mg by mouth daily., Disp: , Rfl:    sodium bicarbonate  650 MG tablet, Take 650 mg by mouth daily., Disp: , Rfl: 5   tamsulosin  (FLOMAX ) 0.4 MG CAPS capsule, Take 1 capsule (0.4 mg total) by mouth daily after breakfast., Disp: 90 capsule, Rfl: 0   TRULICITY  1.5 MG/0.5ML SOAJ, Inject into the skin., Disp: , Rfl:   Vitals   Vitals:   05-24-24 2000 May 24, 2024 2015 2024/05/24 2117 05/24/24 2120  BP:  (!) 221/105 (!) 89/47 111/62  Pulse:  95 (!) 116 (!) 103  Resp: 19 20 (!) 24 19  Temp:      TempSrc:      SpO2:  90% 100% 100%    There is no height or weight on  file to calculate BMI.   Physical Exam   Constitutional: Appears mildly cachectic.  HENT: No OP obstruction. No nuchal rigidity Head: Normocephalic.  Respiratory: While asleep and snoring, respiratory effort is normal, with non-labored breathing.  Skin: No lesions to face, arms or lower legs  Neurologic Examination   Mental Status: (Exam performed after patient received 2 mg IV Ativan ) Obtunded versus severely somnolent state with eyes closed and snoring respirations. Does not arouse to voice or sternal rub. Will slightly move BLE semipurposefully to pinch to each leg, but does not move upper extremities to noxious.  Cranial Nerves: II: No blink to threat bilaterally. Pupils are pinpoint bilaterally.   III,IV, VI: Eyes are conjugate and at the midline. Absent oculocephalic reflex.   V, VII: Weak corneal reflexes bilaterally VIII: No response to voice IX,X: Gag reflex deferred XI: Unable to assess XII: Unable to assess Motor/Sensory: BUE with decreased tone. No jerking, twitching or posturing noted. No movement to sternal rub or arm pinch bilaterally.  BLE with slight semipurposeful movement to pinch. Tone is decreased  Deep Tendon Reflexes: 1+ and symmetric BUE and BLE Cerebellar: Unable to assess  Gait: Unable to assess  Labs/Imaging/Neurodiagnostic studies   CBC:  Recent Labs  Lab 05/24/2024 1830 2024-05-24 1957  WBC  --  11.4*  NEUTROABS  --  10.5*  HGB 12.2*  12.9* 11.3*  HCT 36.0*  38.0* 34.8*  MCV  --  79.8*  PLT  --  320   Basic Metabolic Panel:  Lab Results  Component Value Date   NA 138 05-24-24   NA 139 05-24-2024   K 4.7 05/24/2024   K 4.6 May 24, 2024   CO2 16 (L) 05-24-2024   GLUCOSE 391 (H) 05/24/24   BUN 124 (H) May 24, 2024   CREATININE 3.90 (H) 05/24/24   CALCIUM  10.4 (H) 24-May-2024   GFRNONAA 15 (L) 2024/05/24   Lipid Panel:  Lab Results  Component Value Date   LDLCALC 111 (H) 11/13/2006   HgbA1c:  Lab Results  Component Value Date    HGBA1C 8.2 (H) 10/20/2023   Urine Drug Screen:     Component Value Date/Time   LABOPIA NONE DETECTED 04/26/2022 1710   COCAINSCRNUR NONE DETECTED 04/26/2022 1710   LABBENZ NONE DETECTED 04/26/2022 1710   AMPHETMU NONE DETECTED 04/26/2022 1710   THCU NONE DETECTED 04/26/2022 1710   LABBARB NONE DETECTED 04/26/2022 1710    Alcohol Level No results found for: [facility] INR  Lab Results  Component Value Date   INR 1.2 24-May-2024   APTT  Lab Results  Component Value Date   APTT 32 04/26/2022     ASSESSMENT  89 y.o. male with a PMHx of colon cancer s/p hemicolectomy, DM, CKD, HTN, dementia and prior carotid artery occlusion who presents to the ED with respiratory distress. He is ambulatory at baseline but seemed weak over the past week, having to sleep in a chair for the past 3 nights due to not being strong enough to stand up. On Tuesday night he was minimally responsive, per family. This morning, he had onset of labored breathing with a wet cough. On EMS arrival his O2 saturation was 64% on RA, BP 150/90, HR 130. O2 sats improved to 89% with nebulizer and 4L O2 by cannula. CBG was 468. Regarding his dementia, daughter stated that for the past several months he had been less conversant than normal.  - Exam reveals an obtunded versus severely somnolent patient with eyes closed and snoring respirations. Does not arouse to voice or sternal rub. Will slightly move BLE semipurposefully to pinch to each leg, but does not move upper extremities to noxious. Pupils are pinpoint.  - CT head: No acute intracranial CT findings or interval changes. Atrophy and small-vessel disease with atrophic ventriculomegaly. Stable exam since February of 2025. Atherosclerosis. - Spot EEG preliminary review reveals diffuse slowing without electrographic seizures.  - CXR: Scattered airspace disease in the perihilar region on the right and at the left lung base, concerning for pneumonia. - Labs: - Na and K normal.  -  Glucose significantly elevated at 411. Beta hydroxybutyric acid is normal.  - BUN 108, Cr 3.76, eGFR 16 - Ca inifially elevated at 10.4, improved to 8.0 in setting of low albumin of 1.9. Ionized Ca was low on admission but is now normal as of Thursday morning.  - Mg initially elevated, now normalized.  - Transaminases are not elevated, but total bilirubin now abnormally elevated at 2.8, from 0.5 on admission - Leukocytosis 11.4 >> 15.8 - U/A is not consistent with infection - Negative RSV, flu and Covid - TSH normal - Impression:  - DDx for the patient's witnessed abnormal movements in the ED includes seizure, but the semiology described by the RN who witnessed them seems more consistent with incidental nonpurposeful eye and right limb movements in the setting of a severe encephalopathy.  - Potential etiologies for the patient's encephalopathy include an extended period of hypoxia at home given the patient's low O2 sats on EMS arrival, toxic/metabolic etiologies (including uremia and hepatic encephalopathy) and possible infection (findings concerning for pneumonia on CXR). Postictal state from possible unwitnessed seizure is also on the DDx.     - Decreased neurological reserve in the setting of the patient's dementia may also be contributing to his AMS in the setting of the above factors.    RECOMMENDATIONS  - MRI brain - Toxic/metabolic/infectious work up - Frequent neuro checks - Ammonia, B12, thiamine levels (ordered) - Management of his hyperglycemia - Awaiting official EEG report  Addendum:  EEG official report findings and conclusions: Burst attenuation, generalized. This study is suggestive of profound diffuse encephalopathy.  No seizures or epileptiform discharges were seen throughout the recording. ______________________________________________________________________    Bonney SHARK, Tangela Dolliver, MD [facility]

## 2024-05-20 NOTE — ED Triage Notes (Signed)
 Pt presents to ED via EMS from home with respiratory distress that has been going on all day. Rhonchi in all fields. Recent treatment for UTI with one day left for treatment for that. 2 duonebs given and LR bolus.   150/90 130 HR 64% on room air; 89% on nebulizer and nasal cannula 4L Solumedrol and magnesium  given IV; 18 LW  CBG 468

## 2024-05-20 NOTE — Progress Notes (Signed)
 EEG complete - results pending

## 2024-05-20 NOTE — Sepsis Progress Note (Signed)
 Elink will follow per sepsis protocol.

## 2024-05-20 NOTE — ED Notes (Signed)
 Patient trialed off bipap per Dr Melvenia at bedside. Patient maintained oxygen levels and had no change in work of breathing.

## 2024-05-20 NOTE — ED Notes (Signed)
 Patient had seizure lasting about 2 minutes. Eyes deviated to the left with no blink response. Provider notified.

## 2024-05-21 ENCOUNTER — Inpatient Hospital Stay (HOSPITAL_COMMUNITY)

## 2024-05-21 ENCOUNTER — Encounter (HOSPITAL_COMMUNITY): Payer: Self-pay

## 2024-05-21 DIAGNOSIS — I6389 Other cerebral infarction: Secondary | ICD-10-CM | POA: Diagnosis not present

## 2024-05-21 DIAGNOSIS — G9340 Encephalopathy, unspecified: Secondary | ICD-10-CM

## 2024-05-21 DIAGNOSIS — J9601 Acute respiratory failure with hypoxia: Secondary | ICD-10-CM | POA: Diagnosis present

## 2024-05-21 DIAGNOSIS — J9690 Respiratory failure, unspecified, unspecified whether with hypoxia or hypercapnia: Secondary | ICD-10-CM | POA: Diagnosis not present

## 2024-05-21 DIAGNOSIS — R569 Unspecified convulsions: Secondary | ICD-10-CM

## 2024-05-21 DIAGNOSIS — R17 Unspecified jaundice: Secondary | ICD-10-CM | POA: Diagnosis present

## 2024-05-21 DIAGNOSIS — G9341 Metabolic encephalopathy: Secondary | ICD-10-CM | POA: Diagnosis not present

## 2024-05-21 DIAGNOSIS — Z66 Do not resuscitate: Secondary | ICD-10-CM | POA: Diagnosis not present

## 2024-05-21 DIAGNOSIS — N179 Acute kidney failure, unspecified: Secondary | ICD-10-CM | POA: Diagnosis present

## 2024-05-21 DIAGNOSIS — R6521 Severe sepsis with septic shock: Secondary | ICD-10-CM | POA: Diagnosis present

## 2024-05-21 DIAGNOSIS — G928 Other toxic encephalopathy: Secondary | ICD-10-CM | POA: Diagnosis present

## 2024-05-21 DIAGNOSIS — I6381 Other cerebral infarction due to occlusion or stenosis of small artery: Secondary | ICD-10-CM | POA: Diagnosis present

## 2024-05-21 DIAGNOSIS — E870 Hyperosmolality and hypernatremia: Secondary | ICD-10-CM | POA: Diagnosis not present

## 2024-05-21 DIAGNOSIS — J690 Pneumonitis due to inhalation of food and vomit: Secondary | ICD-10-CM

## 2024-05-21 DIAGNOSIS — K56609 Unspecified intestinal obstruction, unspecified as to partial versus complete obstruction: Secondary | ICD-10-CM | POA: Diagnosis present

## 2024-05-21 DIAGNOSIS — N184 Chronic kidney disease, stage 4 (severe): Secondary | ICD-10-CM | POA: Diagnosis present

## 2024-05-21 DIAGNOSIS — E43 Unspecified severe protein-calorie malnutrition: Secondary | ICD-10-CM | POA: Diagnosis present

## 2024-05-21 DIAGNOSIS — E1122 Type 2 diabetes mellitus with diabetic chronic kidney disease: Secondary | ICD-10-CM | POA: Diagnosis present

## 2024-05-21 DIAGNOSIS — R4182 Altered mental status, unspecified: Secondary | ICD-10-CM | POA: Diagnosis not present

## 2024-05-21 DIAGNOSIS — I129 Hypertensive chronic kidney disease with stage 1 through stage 4 chronic kidney disease, or unspecified chronic kidney disease: Secondary | ICD-10-CM | POA: Diagnosis present

## 2024-05-21 DIAGNOSIS — A419 Sepsis, unspecified organism: Secondary | ICD-10-CM | POA: Diagnosis present

## 2024-05-21 DIAGNOSIS — I10 Essential (primary) hypertension: Secondary | ICD-10-CM

## 2024-05-21 DIAGNOSIS — Z515 Encounter for palliative care: Secondary | ICD-10-CM | POA: Diagnosis not present

## 2024-05-21 DIAGNOSIS — E1151 Type 2 diabetes mellitus with diabetic peripheral angiopathy without gangrene: Secondary | ICD-10-CM | POA: Diagnosis present

## 2024-05-21 DIAGNOSIS — G9389 Other specified disorders of brain: Secondary | ICD-10-CM | POA: Diagnosis present

## 2024-05-21 DIAGNOSIS — I63512 Cerebral infarction due to unspecified occlusion or stenosis of left middle cerebral artery: Secondary | ICD-10-CM | POA: Diagnosis not present

## 2024-05-21 DIAGNOSIS — E872 Acidosis, unspecified: Secondary | ICD-10-CM | POA: Diagnosis present

## 2024-05-21 DIAGNOSIS — E1165 Type 2 diabetes mellitus with hyperglycemia: Secondary | ICD-10-CM

## 2024-05-21 DIAGNOSIS — I639 Cerebral infarction, unspecified: Secondary | ICD-10-CM | POA: Diagnosis not present

## 2024-05-21 DIAGNOSIS — R1310 Dysphagia, unspecified: Secondary | ICD-10-CM | POA: Diagnosis not present

## 2024-05-21 DIAGNOSIS — Z711 Person with feared health complaint in whom no diagnosis is made: Secondary | ICD-10-CM | POA: Diagnosis not present

## 2024-05-21 DIAGNOSIS — Z7189 Other specified counseling: Secondary | ICD-10-CM | POA: Diagnosis not present

## 2024-05-21 DIAGNOSIS — F0390 Unspecified dementia without behavioral disturbance: Secondary | ICD-10-CM | POA: Diagnosis present

## 2024-05-21 DIAGNOSIS — I5A Non-ischemic myocardial injury (non-traumatic): Secondary | ICD-10-CM | POA: Diagnosis present

## 2024-05-21 DIAGNOSIS — J189 Pneumonia, unspecified organism: Secondary | ICD-10-CM

## 2024-05-21 LAB — HEMOGLOBIN A1C
Hgb A1c MFr Bld: 7.6 % — ABNORMAL HIGH (ref 4.8–5.6)
Mean Plasma Glucose: 171 mg/dL

## 2024-05-21 LAB — COMPREHENSIVE METABOLIC PANEL WITH GFR
ALT: 8 U/L (ref 0–44)
AST: 14 U/L — ABNORMAL LOW (ref 15–41)
Albumin: 1.9 g/dL — ABNORMAL LOW (ref 3.5–5.0)
Alkaline Phosphatase: 47 U/L (ref 38–126)
Anion gap: 12 (ref 5–15)
BUN: 87 mg/dL — ABNORMAL HIGH (ref 8–23)
CO2: 19 mmol/L — ABNORMAL LOW (ref 22–32)
Calcium: 8 mg/dL — ABNORMAL LOW (ref 8.9–10.3)
Chloride: 104 mmol/L (ref 98–111)
Creatinine, Ser: 2.62 mg/dL — ABNORMAL HIGH (ref 0.61–1.24)
GFR, Estimated: 23 mL/min — ABNORMAL LOW (ref >60)
Glucose, Bld: 182 mg/dL — ABNORMAL HIGH (ref 70–99)
Potassium: 3.7 mmol/L (ref 3.5–5.1)
Sodium: 135 mmol/L (ref 135–145)
Total Bilirubin: 2.8 mg/dL — ABNORMAL HIGH (ref 0.0–1.2)
Total Protein: 4.5 g/dL — ABNORMAL LOW (ref 6.5–8.1)

## 2024-05-21 LAB — BASIC METABOLIC PANEL WITH GFR
Anion gap: 18 — ABNORMAL HIGH (ref 5–15)
BUN: 104 mg/dL — ABNORMAL HIGH (ref 8–23)
CO2: 19 mmol/L — ABNORMAL LOW (ref 22–32)
Calcium: 9.6 mg/dL (ref 8.9–10.3)
Chloride: 108 mmol/L (ref 98–111)
Creatinine, Ser: 3.45 mg/dL — ABNORMAL HIGH (ref 0.61–1.24)
GFR, Estimated: 16 mL/min — ABNORMAL LOW (ref >60)
Glucose, Bld: 140 mg/dL — ABNORMAL HIGH (ref 70–99)
Potassium: 3.8 mmol/L (ref 3.5–5.1)
Sodium: 145 mmol/L (ref 135–145)

## 2024-05-21 LAB — I-STAT ARTERIAL BLOOD GAS, ED
Acid-base deficit: 2 mmol/L (ref 0.0–2.0)
Bicarbonate: 22 mmol/L (ref 20.0–28.0)
Calcium, Ion: 1.3 mmol/L (ref 1.15–1.40)
HCT: 26 % — ABNORMAL LOW (ref 39.0–52.0)
Hemoglobin: 8.8 g/dL — ABNORMAL LOW (ref 13.0–17.0)
O2 Saturation: 100 %
Patient temperature: 37.3
Potassium: 3.8 mmol/L (ref 3.5–5.1)
Sodium: 142 mmol/L (ref 135–145)
TCO2: 23 mmol/L (ref 22–32)
pCO2 arterial: 35.2 mmHg (ref 32–48)
pH, Arterial: 7.406 (ref 7.35–7.45)
pO2, Arterial: 431 mmHg — ABNORMAL HIGH (ref 83–108)

## 2024-05-21 LAB — LACTIC ACID, PLASMA
Lactic Acid, Venous: 5.4 mmol/L (ref 0.5–1.9)
Lactic Acid, Venous: 3.1 mmol/L (ref 0.5–1.9)

## 2024-05-21 LAB — GLUCOSE, CAPILLARY
Glucose-Capillary: 159 mg/dL — ABNORMAL HIGH (ref 70–99)
Glucose-Capillary: 197 mg/dL — ABNORMAL HIGH (ref 70–99)
Glucose-Capillary: 237 mg/dL — ABNORMAL HIGH (ref 70–99)
Glucose-Capillary: 263 mg/dL — ABNORMAL HIGH (ref 70–99)
Glucose-Capillary: 97 mg/dL (ref 70–99)
Glucose-Capillary: 98 mg/dL (ref 70–99)

## 2024-05-21 LAB — CBC
HCT: 24.8 % — ABNORMAL LOW (ref 39.0–52.0)
Hemoglobin: 8.2 g/dL — ABNORMAL LOW (ref 13.0–17.0)
MCH: 26.5 pg (ref 26.0–34.0)
MCHC: 33.1 g/dL (ref 30.0–36.0)
MCV: 80 fL (ref 80.0–100.0)
Platelets: 198 K/uL (ref 150–400)
RBC: 3.1 MIL/uL — ABNORMAL LOW (ref 4.22–5.81)
RDW: 15.4 % (ref 11.5–15.5)
WBC: 15.8 K/uL — ABNORMAL HIGH (ref 4.0–10.5)
nRBC: 0 % (ref 0.0–0.2)

## 2024-05-21 LAB — CBG MONITORING, ED
Glucose-Capillary: 101 mg/dL — ABNORMAL HIGH (ref 70–99)
Glucose-Capillary: 112 mg/dL — ABNORMAL HIGH (ref 70–99)
Glucose-Capillary: 130 mg/dL — ABNORMAL HIGH (ref 70–99)
Glucose-Capillary: 146 mg/dL — ABNORMAL HIGH (ref 70–99)
Glucose-Capillary: 176 mg/dL — ABNORMAL HIGH (ref 70–99)

## 2024-05-21 LAB — MAGNESIUM: Magnesium: 2.2 mg/dL (ref 1.7–2.4)

## 2024-05-21 LAB — PHOSPHORUS: Phosphorus: 3.6 mg/dL (ref 2.5–4.6)

## 2024-05-21 LAB — AMMONIA: Ammonia: <13 umol/L (ref 9–35)

## 2024-05-21 LAB — VITAMIN B12: Vitamin B-12: 510 pg/mL (ref 180–914)

## 2024-05-21 LAB — MRSA NEXT GEN BY PCR, NASAL: MRSA by PCR Next Gen: NOT DETECTED

## 2024-05-21 LAB — TSH: TSH: 0.979 u[IU]/mL (ref 0.350–4.500)

## 2024-05-21 LAB — TROPONIN I (HIGH SENSITIVITY): Troponin I (High Sensitivity): 56 ng/L — ABNORMAL HIGH (ref <18)

## 2024-05-21 MED ORDER — VANCOMYCIN VARIABLE DOSE PER UNSTABLE RENAL FUNCTION (PHARMACIST DOSING): Status: DC

## 2024-05-21 MED ORDER — ALBUTEROL SULFATE (2.5 MG/3ML) 0.083% IN NEBU: 2.5000 mg | INHALATION_SOLUTION | RESPIRATORY_TRACT | Status: DC | PRN

## 2024-05-21 MED ORDER — DOCUSATE SODIUM 50 MG/5ML PO LIQD
100.0000 mg | Freq: Two times a day (BID) | ORAL | Status: DC
  Administered 2024-05-21 – 2024-06-04 (×23): 100 mg
  Filled 2024-05-21 (×25): qty 10

## 2024-05-21 MED ORDER — IPRATROPIUM-ALBUTEROL 0.5-2.5 (3) MG/3ML IN SOLN
3.0000 mL | Freq: Four times a day (QID) | RESPIRATORY_TRACT | Status: DC
  Administered 2024-05-21 – 2024-05-28 (×32): 3 mL via RESPIRATORY_TRACT
  Filled 2024-05-21 (×33): qty 3

## 2024-05-21 MED ORDER — SODIUM CHLORIDE 0.9 % IV SOLN: 500.0000 mg | INTRAVENOUS | Status: DC

## 2024-05-21 MED ORDER — ORAL CARE MOUTH RINSE: 15.0000 mL | OROMUCOSAL | Status: DC | PRN

## 2024-05-21 MED ORDER — PIPERACILLIN-TAZOBACTAM IN DEX 2-0.25 GM/50ML IV SOLN
2.2500 g | Freq: Three times a day (TID) | INTRAVENOUS | Status: AC
  Administered 2024-05-21 – 2024-05-26 (×17): 2.25 g via INTRAVENOUS
  Filled 2024-05-21 (×17): qty 50

## 2024-05-21 MED ORDER — CHLORHEXIDINE GLUCONATE CLOTH 2 % EX PADS
6.0000 | MEDICATED_PAD | Freq: Every day | CUTANEOUS | Status: DC
  Administered 2024-05-21 – 2024-06-04 (×16): 6 via TOPICAL

## 2024-05-21 MED ORDER — LEVETIRACETAM (KEPPRA) 500 MG/5 ML ADULT IV PUSH
500.0000 mg | Freq: Two times a day (BID) | INTRAVENOUS | Status: DC
  Administered 2024-05-21: 500 mg via INTRAVENOUS
  Filled 2024-05-21: qty 5

## 2024-05-21 MED ORDER — PROPOFOL 1000 MG/100ML IV EMUL
0.0000 ug/kg/min | INTRAVENOUS | Status: DC
  Administered 2024-05-21: 30 ug/kg/min via INTRAVENOUS
  Administered 2024-05-21: 10 ug/kg/min via INTRAVENOUS
  Administered 2024-05-21 – 2024-05-22 (×2): 20 ug/kg/min via INTRAVENOUS
  Filled 2024-05-21 (×4): qty 100

## 2024-05-21 MED ORDER — HYDRALAZINE HCL 20 MG/ML IJ SOLN
10.0000 mg | Freq: Two times a day (BID) | INTRAMUSCULAR | Status: DC | PRN
  Administered 2024-05-21: 10 mg via INTRAVENOUS
  Filled 2024-05-21: qty 1

## 2024-05-21 MED ORDER — SODIUM CHLORIDE 0.9% FLUSH
3.0000 mL | Freq: Two times a day (BID) | INTRAVENOUS | Status: DC
  Administered 2024-05-21 – 2024-06-04 (×29): 3 mL via INTRAVENOUS

## 2024-05-21 MED ORDER — ORAL CARE MOUTH RINSE
15.0000 mL | OROMUCOSAL | Status: DC
  Administered 2024-05-21 – 2024-05-24 (×43): 15 mL via OROMUCOSAL

## 2024-05-21 MED ORDER — ETOMIDATE 2 MG/ML IV SOLN
10.0000 mg | Freq: Once | INTRAVENOUS | Status: AC
  Administered 2024-05-21: 10 mg via INTRAVENOUS
  Filled 2024-05-21: qty 10

## 2024-05-21 MED ORDER — FENTANYL CITRATE PF 50 MCG/ML IJ SOSY
25.0000 ug | PREFILLED_SYRINGE | INTRAMUSCULAR | Status: DC | PRN
  Administered 2024-05-21 – 2024-05-23 (×3): 50 ug via INTRAVENOUS
  Administered 2024-05-23 (×2): 100 ug via INTRAVENOUS
  Administered 2024-05-24 (×4): 50 ug via INTRAVENOUS
  Administered 2024-05-24: 100 ug via INTRAVENOUS
  Administered 2024-05-24 – 2024-05-26 (×6): 50 ug via INTRAVENOUS
  Administered 2024-05-26: 100 ug via INTRAVENOUS
  Administered 2024-05-27: 50 ug via INTRAVENOUS
  Filled 2024-05-21: qty 1
  Filled 2024-05-21: qty 2
  Filled 2024-05-21 (×9): qty 1
  Filled 2024-05-21 (×3): qty 2
  Filled 2024-05-21: qty 1
  Filled 2024-05-21 (×2): qty 2

## 2024-05-21 MED ORDER — ACETAMINOPHEN 10 MG/ML IV SOLN
1000.0000 mg | Freq: Four times a day (QID) | INTRAVENOUS | Status: AC
  Administered 2024-05-21 (×2): 1000 mg via INTRAVENOUS
  Filled 2024-05-21 (×3): qty 100

## 2024-05-21 MED ORDER — LORAZEPAM 2 MG/ML IJ SOLN
2.0000 mg | INTRAMUSCULAR | Status: DC | PRN
  Filled 2024-05-21: qty 1

## 2024-05-21 MED ORDER — VANCOMYCIN HCL 500 MG/100ML IV SOLN
500.0000 mg | Freq: Once | INTRAVENOUS | Status: AC
  Administered 2024-05-21: 500 mg via INTRAVENOUS
  Filled 2024-05-21: qty 100

## 2024-05-21 MED ORDER — HYDROMORPHONE HCL 1 MG/ML IJ SOLN: 0.5000 mg | INTRAMUSCULAR | Status: DC | PRN

## 2024-05-21 MED ORDER — LABETALOL HCL 5 MG/ML IV SOLN
5.0000 mg | INTRAVENOUS | Status: DC | PRN
  Administered 2024-05-21 – 2024-05-26 (×4): 5 mg via INTRAVENOUS
  Filled 2024-05-21 (×4): qty 4

## 2024-05-21 MED ORDER — INSULIN ASPART 100 UNIT/ML IJ SOLN
0.0000 [IU] | INTRAMUSCULAR | Status: DC
  Administered 2024-05-21: 8 [IU] via SUBCUTANEOUS
  Administered 2024-05-21 (×2): 3 [IU] via SUBCUTANEOUS
  Administered 2024-05-22 (×2): 2 [IU] via SUBCUTANEOUS
  Administered 2024-05-23: 3 [IU] via SUBCUTANEOUS
  Administered 2024-05-23: 2 [IU] via SUBCUTANEOUS
  Administered 2024-05-23: 3 [IU] via SUBCUTANEOUS
  Administered 2024-05-23 – 2024-05-24 (×3): 2 [IU] via SUBCUTANEOUS
  Administered 2024-05-24 (×4): 3 [IU] via SUBCUTANEOUS
  Administered 2024-05-25: 5 [IU] via SUBCUTANEOUS
  Administered 2024-05-25: 3 [IU] via SUBCUTANEOUS
  Administered 2024-05-25: 2 [IU] via SUBCUTANEOUS
  Administered 2024-05-25: 3 [IU] via SUBCUTANEOUS
  Administered 2024-05-25: 5 [IU] via SUBCUTANEOUS
  Administered 2024-05-26 (×2): 8 [IU] via SUBCUTANEOUS
  Administered 2024-05-26: 3 [IU] via SUBCUTANEOUS
  Administered 2024-05-26 (×2): 5 [IU] via SUBCUTANEOUS
  Administered 2024-05-26 – 2024-05-27 (×2): 3 [IU] via SUBCUTANEOUS
  Administered 2024-05-27: 5 [IU] via SUBCUTANEOUS
  Administered 2024-05-27: 2 [IU] via SUBCUTANEOUS
  Administered 2024-05-27 (×3): 3 [IU] via SUBCUTANEOUS
  Administered 2024-05-28: 2 [IU] via SUBCUTANEOUS
  Administered 2024-05-28: 3 [IU] via SUBCUTANEOUS
  Administered 2024-05-28 (×2): 2 [IU] via SUBCUTANEOUS
  Administered 2024-05-28: 3 [IU] via SUBCUTANEOUS
  Administered 2024-05-28 – 2024-05-29 (×2): 2 [IU] via SUBCUTANEOUS
  Administered 2024-05-29 (×2): 3 [IU] via SUBCUTANEOUS
  Administered 2024-05-29 – 2024-05-30 (×5): 5 [IU] via SUBCUTANEOUS
  Administered 2024-05-30: 2 [IU] via SUBCUTANEOUS
  Administered 2024-05-30 – 2024-05-31 (×2): 3 [IU] via SUBCUTANEOUS
  Administered 2024-05-31: 5 [IU] via SUBCUTANEOUS
  Administered 2024-05-31 (×3): 3 [IU] via SUBCUTANEOUS
  Administered 2024-06-01: 5 [IU] via SUBCUTANEOUS
  Administered 2024-06-01: 2 [IU] via SUBCUTANEOUS
  Administered 2024-06-01: 5 [IU] via SUBCUTANEOUS
  Administered 2024-06-01: 8 [IU] via SUBCUTANEOUS
  Administered 2024-06-01 – 2024-06-02 (×2): 3 [IU] via SUBCUTANEOUS
  Administered 2024-06-02: 5 [IU] via SUBCUTANEOUS
  Administered 2024-06-02: 2 [IU] via SUBCUTANEOUS
  Administered 2024-06-02: 3 [IU] via SUBCUTANEOUS
  Administered 2024-06-02: 11 [IU] via SUBCUTANEOUS
  Administered 2024-06-02: 15 [IU] via SUBCUTANEOUS
  Administered 2024-06-03: 8 [IU] via SUBCUTANEOUS
  Administered 2024-06-03: 3 [IU] via SUBCUTANEOUS
  Administered 2024-06-03: 8 [IU] via SUBCUTANEOUS
  Administered 2024-06-03: 5 [IU] via SUBCUTANEOUS
  Administered 2024-06-03: 3 [IU] via SUBCUTANEOUS
  Administered 2024-06-04: 5 [IU] via SUBCUTANEOUS
  Administered 2024-06-04 (×2): 3 [IU] via SUBCUTANEOUS

## 2024-05-21 MED ORDER — DOXYCYCLINE HYCLATE 100 MG IV SOLR: 200.0000 mg | Freq: Two times a day (BID) | INTRAVENOUS | Status: DC

## 2024-05-21 MED ORDER — STROKE: EARLY STAGES OF RECOVERY BOOK
Freq: Once | Status: AC
Start: 2024-05-22 — End: 2024-05-22
  Filled 2024-05-21: qty 1

## 2024-05-21 MED ORDER — POLYETHYLENE GLYCOL 3350 17 G PO PACK
17.0000 g | PACK | Freq: Every day | ORAL | Status: DC
  Administered 2024-05-21 – 2024-06-01 (×9): 17 g
  Filled 2024-05-21 (×10): qty 1

## 2024-05-21 MED ORDER — HYDRALAZINE HCL 20 MG/ML IJ SOLN
10.0000 mg | INTRAMUSCULAR | Status: DC | PRN
  Administered 2024-05-22 – 2024-05-27 (×9): 10 mg via INTRAVENOUS
  Filled 2024-05-21 (×9): qty 1

## 2024-05-21 MED ORDER — PANTOPRAZOLE SODIUM 40 MG IV SOLR
40.0000 mg | Freq: Every day | INTRAVENOUS | Status: DC
  Administered 2024-05-21 – 2024-06-03 (×14): 40 mg via INTRAVENOUS
  Filled 2024-05-21 (×14): qty 10

## 2024-05-21 MED ORDER — ONDANSETRON HCL 4 MG/2ML IJ SOLN: 4.0000 mg | Freq: Four times a day (QID) | INTRAMUSCULAR | Status: DC | PRN

## 2024-05-21 MED ORDER — ASPIRIN 81 MG PO CHEW
81.0000 mg | CHEWABLE_TABLET | Freq: Every day | ORAL | Status: DC
  Administered 2024-05-21 – 2024-05-29 (×9): 81 mg
  Filled 2024-05-21 (×9): qty 1

## 2024-05-21 MED ORDER — FENTANYL CITRATE PF 50 MCG/ML IJ SOSY: 25.0000 ug | PREFILLED_SYRINGE | INTRAMUSCULAR | Status: DC | PRN

## 2024-05-21 MED ORDER — HEPARIN SODIUM (PORCINE) 5000 UNIT/ML IJ SOLN
5000.0000 [IU] | Freq: Three times a day (TID) | INTRAMUSCULAR | Status: DC
  Administered 2024-05-21 – 2024-06-04 (×43): 5000 [IU] via SUBCUTANEOUS
  Filled 2024-05-21 (×40): qty 1

## 2024-05-21 MED ORDER — GADOBUTROL 1 MMOL/ML IV SOLN
7.0000 mL | Freq: Once | INTRAVENOUS | Status: AC | PRN
  Administered 2024-05-21: 7 mL via INTRAVENOUS

## 2024-05-21 MED ORDER — FAMOTIDINE 20 MG PO TABS: 20.0000 mg | ORAL_TABLET | Freq: Two times a day (BID) | ORAL | Status: DC

## 2024-05-21 MED ORDER — SODIUM CHLORIDE 0.9 % IV SOLN
100.0000 mg | Freq: Two times a day (BID) | INTRAVENOUS | Status: AC
  Administered 2024-05-21 – 2024-05-26 (×11): 100 mg via INTRAVENOUS
  Filled 2024-05-21 (×11): qty 100

## 2024-05-21 MED ORDER — MIDAZOLAM HCL 2 MG/2ML IJ SOLN: 1.0000 mg | INTRAMUSCULAR | Status: DC | PRN

## 2024-05-21 NOTE — Procedures (Signed)
 Intubation Procedure Note  SYLVESTER MINTON  [id number]  10-Apr-1933  Date:05/21/24  Time:2:49 AM   Provider Performing: Corean CHRISTELLA Mio Schellinger   Procedure: Intubation (31500)  Indication(s): Respiratory Failure  Consent: Unable to obtain consent due to emergent nature of procedure.  Anesthesia: Etomidate  10mg   Time Out: Verified patient identification, verified procedure, site/side was marked, verified correct patient position, special equipment/implants available, medications/allergies/relevant history reviewed, required imaging and test results available.  Sterile Technique: Usual hand hygiene, masks, and gloves were used  Procedure Description: Patient positioned in bed supine.  Sedation given as noted above.  Patient was intubated with endotracheal tube using Glidescope.  View was Grade 1 full glottis .  Copious thick, tan oropharyngeal secretions noted. Number of attempts was 1.  Colorimetric CO2 detector was consistent with tracheal placement.  Complications/Tolerance: None; patient tolerated the procedure well. Chest X-ray is ordered to verify placement.  EBL: Minimal  Specimen(s): None  Corean CHRISTELLA Joffrey Kerce, [place] [facility] 05/21/24 2:50 AM  Please see [email or web address] for pager details.  From 7A-7P if no response, please call [phone] After hours, please call ELink [phone]

## 2024-05-21 NOTE — Plan of Care (Signed)
 Discussed with primary team and MRI brain personally reviewed  Incidental small vessel disease stroke, would not explain his mental status or symptoms but do recommend workup for risk factor optimization  Stroke orderset placed with assistance of resident Dr. Adele - ECHO, A1c, lipid panel, MRA head, carotid US  - Please resume Aspirin  81 mg daily if no medical contraindications  Per Dr. Merrianne earlier note, seizure like activity was not felt to be true seizure, MRI without clear seizure focus, EEG c/w toxic/metabolic state, appreciate ICU team continued excellent medical care.   Stroke team to follow tomorrow for stroke workup / optimization of stroke risk  Lola Jernigan MD-PhD [facility] [phone] Available 7 AM to 7 PM, outside these hours please contact Neurologist on call listed on AMION   No charge note

## 2024-05-21 NOTE — TOC CM/SW Note (Signed)
 Transition of Care [facility]) - Inpatient Brief Assessment   Patient Details  Name: Jonathan Baird MRN: [id number] Date of Birth: 06-19-1933  Transition of Care [facility]) CM/SW Contact:    Tom-Johnson, Harvest Muskrat, RN Phone Number: 05/21/2024, 10:21 AM   Clinical Narrative:  Patient presented to the ED with acute onset of Respiratory distress, worsening Mental Status and Unresponsiveness.  Has hx of Dementia, HTN, T2DM, CKD4, HLD, PAD, Carotid Stenosis on Anticoagulation and TIA.  Patient is currently intubated and sedated. On IV abx.   Patient not Medically ready for discharge.  CM will continue to follow for discharge needs as patient progresses with care towards discharge.         Transition of Care Asessment:

## 2024-05-21 NOTE — ED Notes (Signed)
 Per endotool, clamp line and reach out to provider. Provider notified.

## 2024-05-21 NOTE — Procedures (Signed)
 Patient Name: Jonathan Baird  MRN: [id number]  Epilepsy Attending: Arlin MALVA Krebs  Referring Physician/Provider: Merrianne Locus, MD  Date: 05/21/2024 Duration: 24.55 mins  Patient history: 89yo M with ams. EEG to evaluate for seizure  Level of alertness: comatose  AEDs during EEG study: LEV, Propofol   Technical aspects: This EEG study was done with scalp electrodes positioned according to the 10-20 International system of electrode placement. Electrical activity was reviewed with band pass filter of 1-70Hz , sensitivity of 7 uV/mm, display speed of 60mm/sec with a 60Hz  notched filter applied as appropriate. EEG data were recorded continuously and digitally stored.  Video monitoring was available and reviewed as appropriate.  Description: EEG showed burst attenuation pattern with bursts of 5 to 6 Hz theta slowing lasting 2 to 3 seconds alternating with 1 to 2 seconds of generalized EEG attenuation.  Hyperventilation and photic stimulation were not performed.     ABNORMALITY - Burst attenuation, generalized  IMPRESSION: This study is suggestive of profound diffuse encephalopathy.  No seizures or epileptiform discharges were seen throughout the recording.  [name]

## 2024-05-21 NOTE — Progress Notes (Signed)
 Pt transported to/from MRI on the vent with RN and transport without incident.

## 2024-05-21 NOTE — Progress Notes (Signed)
 Initial Nutrition Assessment  DOCUMENTATION CODES:   Severe malnutrition in context of chronic illness (dementia, CKD IV)  INTERVENTION:  Appreciate surgery recommendations for how to proceed with nutrition support initiation  Recommend initiating EN trickle feeds or TPN within 48 hours given severity of malnutrition:  Parenteral Nutrition - titrate to meet estimated nutrition needs.  Kcal: 1600-1800 kcal Protein: 75-90 g    Initiate enteral tube feeding via NG: trickle feeds only to assess toleration Vital AF 1.2 at 20 ml/h (480 ml per day)  Prosource TF20 60 ml BID  Provides 736 kcal, 76 gm protein, 389 ml free water daily  Pt is at high risk for refeeding syndrome given severity of malnutrition and unknown diet hx. Monitor magnesium  and phosphorus daily x 2 days, MD to replete as needed. 100mg  thiamine x 5 days    NUTRITION DIAGNOSIS:   Severe Malnutrition related to chronic illness (dementia, CKD IV) as evidenced by severe muscle depletion, severe fat depletion.  GOAL:   Patient will meet greater than or equal to 90% of their needs  MONITOR:   Vent status, Diet advancement  REASON FOR ASSESSMENT:   Ventilator    ASSESSMENT:   Pt with hx HTN, HLD, dementia, type 2 diabetes, CKD IV, and PVD. Hx of colon cancer s/p hemicolectomy (2005). Pt admitted from home for acute hypoxemic respiratory failure with a witnessed seizure in the ED. Pt required intubation for airway protection.  EEG negative for seizures but shows diffuse encephalopathy. Pt being monitored for sepsis and seizures. Pt shows hyperbilirubinemia and may have SBO, in SBO protocol. NGT for suction, removed 1,949mL + in 24 + 10 hours.  Pt intubated and sedated at time of assessment. No family or visitors present at bedside. Unable to obtain diet hx at this time. Wt hx per chart review shows 9% wt loss between December 2024 and February 2025 which is clinically significant. Suspect wt listed in chart  is estimated and current wt may be less than what's documented, will obtain new wt at follow up. Nutrition focused physical exam shows severe fat and severe muscle depletions, indicative of malnutrition. Suspect malnutrition most likely related to progressive dementia and other health conditions. Will continue to monitor surgery's suggestions/recommendations for nutrition support. Recommend initiating EN trickle feeds or TPN within 48 hours due to severity of depletions seen on exam. Without any diet hx, assume pt is at high refeeding risk given physical exam and wt loss hx.  Patient is currently intubated on ventilator support MV: 8.9 L/min Temp (24hrs), Avg:97.1 F (36.2 C), Min:96.1 F (35.6 C), Max:99.4 F (37.4 C)  MAP (cuff): 106 mmHg  Propofol : 3.9 ml/hr  Admit weight: 65 kg  Current weight: 65 kg   Intake/Output Summary (Last 24 hours) at 05/21/2024 1149 Last data filed at 05/21/2024 1031 Gross per 24 hour  Intake 4883.36 ml  Output 2800 ml  Net 2083.36 ml   Net IO Since Admission: 2,083.36 mL [05/21/24 1149]  Drains/Lines: NG right nare: (24 hrs) + (10hrs) UOP:  Nutritionally Relevant Medications: Scheduled Meds:  docusate  100 mg Per Tube BID   insulin  aspart  0-15 Units Subcutaneous Q4H   levETIRAcetam   500 mg Intravenous BID   pantoprazole  (PROTONIX ) IV  40 mg Intravenous Daily   polyethylene glycol  17 g Per Tube Daily   Continuous Infusions:  lactated ringers  125 mL/hr at 05/21/24 0719   piperacillin -tazobactam (ZOSYN )  IV     propofol  (DIPRIVAN ) infusion 10 mcg/kg/min (05/21/24  9279)  Propofol  3.32ml/hr which provides additional 102 kcal daily  Labs Reviewed: BUN 87/ Creatinine 2.62 CBG ranges from 101-425 mg/dL over the last 24 hours HgbA1c 8.2 (10/2023)   NUTRITION - FOCUSED PHYSICAL EXAM:  Flowsheet Row Most Recent Value  Orbital Region Unable to assess  [vent]  Upper Arm Region Severe depletion  Thoracic and Lumbar Region Severe  depletion  Buccal Region Unable to assess  [vent]  Temple Region Severe depletion  Clavicle Bone Region Severe depletion  Clavicle and Acromion Bone Region Severe depletion  Scapular Bone Region Severe depletion  Dorsal Hand Unable to assess  [mittens]  Patellar Region Severe depletion  Anterior Thigh Region Severe depletion  Posterior Calf Region Severe depletion  Edema (RD Assessment) None  Hair Reviewed  Eyes Unable to assess  [sedated]  Mouth Unable to assess  [vent]  Skin Reviewed  Nails Unable to assess  [mittens]    Diet Order:   Diet Order             Diet NPO time specified  Diet effective now                   EDUCATION NEEDS:   Not appropriate for education at this time  Skin:  Skin Assessment: Skin Integrity Issues: Skin Integrity Issues:: Stage II Stage II: buttocks  Last BM:  unknown  Height:   Ht Readings from Last 1 Encounters:  05/21/24 5' 10 (1.778 m)   Weight:   Wt Readings from Last 1 Encounters:  05/21/24 65 kg    Ideal Body Weight:  75.5 kg  BMI:  Body mass index is 20.56 kg/m.  Estimated Nutritional Needs:   Kcal:  1600-1800  Protein:  75-90g  Fluid:  1.6-1.8L    Josette Glance, MS, RDN, LDN Clinical Dietitian I Please reach out via secure chat

## 2024-05-21 NOTE — IPAL (Signed)
  Interdisciplinary Goals of Care Family Meeting   Date carried out: 05/21/2024  Location of the meeting: Bedside  Member's involved: Physician, Family Member or next of kin, and Other: Dr. Adele Rower Power of Attorney or acting medical decision maker: Wife, Vue Pavon.  Discussion: We discussed goals of care for Jonathan Baird .  I Discussed Mr. Williard' medical condition with his daughter and wife. Explained that he has suffered from small bowel obstruction, aspiration, respiratory failure, and a small stroke. He is currently on the ventilator, and we are treating him with antibiotics.  I explored the patient's baseline with his daughter and his wife. They expressed that he needed help with ADL's, but was able to eat, walk, and enjoy a decent quality of life. They expressed to me the importance of his quality of life, hoping to prioritize quality of quantity. They asked about prognosis and I explained that his likelihood of returning to his previous baseline is minimal, but it remains to be determined what the new baseline is going to be.  I explained that we will do a WUA tomorrow morning, and asked them to be present for it. We will continue having goals of care conversations at the bedside.  Code status:   Code Status: Full Code   Disposition: Continue current acute care  Time spent for the meeting: 20 minutes.    Belva November, MD  05/21/2024, 3:36 PM

## 2024-05-21 NOTE — ED Notes (Signed)
 Warm blankets applied to patient.

## 2024-05-21 NOTE — ED Notes (Signed)
 Insulin  gtt turned off per Dr. Maree.

## 2024-05-21 NOTE — H&P (Signed)
 History and Physical    [name] FMW:[id number] DOB: 27-Jan-1933 DOA: 05/20/2024  PCP: Jonathan Lombard, MD   Patient coming from: Home   Chief Complaint:  Chief Complaint  Patient presents with   Respiratory Distress    HPI: History limited by mental status, obtained via chart review Jonathan Baird is a 89 y.o. male with hx of dementia, hypertension, hyperlipidemia, diabetes type 2, CKD 4, peripheral artery disease, carotid stenosis on anticoagulation, TIA, who was brought in by family due to acute onset respiratory distress, worsening mental status with unresponsiveness.   Per ED collateral  Per EMS, he was in his normal state of health last night.  Family noticed that he was having shortness of breath today.  They describe a wet cough.  When EMS arrived on scene, patient's SpO2 was in the 60s on room air.  He does not wear oxygen at baseline.  He was noted to have coarse breath sounds.  He was given a DuoNeb.  On DuoNeb, SpO2 improved to the high 80s.  On arrival, patient unable to provide any history.   History per daughter: Patient has dementia at baseline.  For the past several months, he has been less conversant than normal.  He is typically ambulatory with minimal assistance.  Over the past week, he has seemed weak.  They discussed this with their primary care doctor and he was started on antibiotic for treatment of a presumed UTI.  Despite antibiotics, his weakness worsened.  For the past 3 nights he has slept in a chair because he was too weak to stand up.  Last night, a family member tried to get him up and patient was minimally responsive.  This morning, he had onset of labored breathing.   Review of Systems:  ROS unable to complete due to mental status  No Known Allergies  Prior to Admission medications   Medication Sig Start Date End Date Taking? Authorizing Provider  amLODipine  (NORVASC ) 10 MG tablet Take 10 mg by mouth daily. 03/05/18   [provider]  aspirin  (ASPIRIN  81) 81 MG chewable tablet 1 tablet    [provider]  calcitRIOL (ROCALTROL) 0.25 MCG capsule Take 0.25 mcg by mouth daily. 11/02/23   [provider]  ferrous sulfate  325 (65 FE) MG tablet Take 1 tablet (325 mg total) by mouth daily with breakfast. 10/26/23   Vann, Jessica U, DO  GVOKE HYPOPEN 2-PACK 1 MG/0.2ML SOAJ Inject 1 mg into the skin once as needed (for hypoglycemia). 02/08/22   [provider]  hydrALAZINE  (APRESOLINE ) 50 MG tablet Take 50 mg by mouth 3 (three) times daily. 12/03/23   [provider]  hydrochlorothiazide (HYDRODIURIL) 25 MG tablet Take 25 mg by mouth daily. 11/04/23   [provider]  insulin  lispro (HUMALOG KWIKPEN) 100 UNIT/ML KwikPen Inject 4 Units into the skin 3 (three) times daily after meals.    [provider]  lisinopril (PRINIVIL,ZESTRIL) 40 MG tablet Take 40 mg by mouth daily.    [provider]  rosuvastatin  (CRESTOR ) 10 MG tablet Take 10 mg by mouth daily.    [provider]  sodium bicarbonate  650 MG tablet Take 650 mg by mouth daily. 07/16/15   [provider]  tamsulosin  (FLOMAX ) 0.4 MG CAPS capsule Take 1 capsule (0.4 mg total) by mouth daily after breakfast. 01/01/24   Raenelle Coria, MD  TRULICITY  1.5 MG/0.5ML SOAJ Inject into the skin. 12/04/23   [provider]    Past Medical  History:  Diagnosis Date   Carotid artery occlusion    Colon cancer (HCC) 10/12/2004   stage III   Diabetes mellitus     Past Surgical History:  Procedure Laterality Date   hemocolectomy  2005     reports that he has never smoked. He has never used smokeless tobacco. He reports that he does not drink alcohol and does not use drugs.  Family History  Problem Relation Age of Onset   Cancer Mother    Diabetes Father    Cancer Sister      Physical Exam: Vitals:   05/20/24 2234 05/20/24 2245 05/20/24 2300 05/21/24 0100  BP:  121/69 116/70 128/80  Pulse:  64  67 (!) 55  Resp:  (!) 23 (!) 28 (!) 24  Temp: (!) 96.4 F (35.8 C)     TempSrc: Axillary     SpO2:  94% 92% 96%    Gen: Obtunded, with a hard sternal rub he will barely move, does not open eyes  HEENT: Pupils pinpoint, per RN positive gag with NG insertion CV: Irregular rhythm, regular rate, normal S1, S2, no murmurs  Resp: Sonorous respirations, on room air, rales in the right base Abd: Flat, hypoactive, soft, no apparent tenderness, no rigidity MSK: Symmetric, no edema  Skin: No rashes or lesions to exposed skin  Neuro: Obtunded, with a hard sternal rub he will barely move, does not open eyes, pupils pinpoint, question right lower facial droop, per RN positive gag with NG.  He does not withdraw in the upper extremities to pain.  He does in the left lower extremity, not on the right.  Tone is normal, no clonus. Psych: Unable to assess due to mental status   Data review:   Labs reviewed, notable for:   VBG 7.41/31, bicarb 16, AG 24, BUN 108, creatinine 3.7, glucose 411, High-sensitivity troponin 37 -> 45 Lactate 7.8 -> 9.6 -> 7.9 Osm 360, beta hydroxybutyrate within normal limit WBC 11 UA not consistent with infection  Micro:  Results for orders placed or performed during the hospital encounter of 05/20/24  Resp panel by RT-PCR (RSV, Flu A&B, Covid) Anterior Nasal Swab     Status: None   Collection Time: 05/20/24  5:37 PM   Specimen: Anterior Nasal Swab  Result Value Ref Range Status   SARS Coronavirus 2 by RT PCR NEGATIVE NEGATIVE Final   Influenza A by PCR NEGATIVE NEGATIVE Final   Influenza B by PCR NEGATIVE NEGATIVE Final    Comment: (NOTE) The Xpert Xpress SARS-CoV-2/FLU/RSV plus assay is intended as an aid in the diagnosis of influenza from Nasopharyngeal swab specimens and should not be used as a sole basis for treatment. Nasal washings and aspirates are unacceptable for Xpert Xpress SARS-CoV-2/FLU/RSV testing.  Fact Sheet for  Patients: BloggerCourse.com  Fact Sheet for Healthcare Providers: SeriousBroker.it  This test is not yet approved or cleared by the [place]  FDA and has been authorized for detection and/or diagnosis of SARS-CoV-2 by FDA under an Emergency Use Authorization (EUA). This EUA will remain in effect (meaning this test can be used) for the duration of the COVID-19 declaration under Section 564(b)(1) of the Act, 21 U.S.C. section 360bbb-3(b)(1), unless the authorization is terminated or revoked.     Resp Syncytial Virus by PCR NEGATIVE NEGATIVE Final    Comment: (NOTE) Fact Sheet for Patients: BloggerCourse.com  Fact Sheet for Healthcare Providers: SeriousBroker.it  This test is not yet approved or cleared by the [place]  FDA and has been  authorized for detection and/or diagnosis of SARS-CoV-2 by FDA under an Emergency Use Authorization (EUA). This EUA will remain in effect (meaning this test can be used) for the duration of the COVID-19 declaration under Section 564(b)(1) of the Act, 21 U.S.C. section 360bbb-3(b)(1), unless the authorization is terminated or revoked.  Performed at [facility], [address] [place], [place] [address]     Imaging reviewed:  DG Abd Portable 1V-Small Bowel Protocol-Position Verification Result Date: 05/20/2024 CLINICAL DATA:  NG tube placement confirmation EXAM: PORTABLE ABDOMEN - 1 VIEW COMPARISON:  05/20/2024 FINDINGS: Limited field of view for tube placement verification purposes. The enteric tube has been advanced with tip now projecting over the left upper quadrant consistent with location in the body of the stomach. Mild gaseous distention of the stomach. IMPRESSION: Enteric tube has been advanced with tip now projecting over the left upper quadrant consistent with location in the body of the stomach. Electronically Signed   By:  Elsie Gravely M.D.   On: 05/20/2024 23:00   DG Abd Portable 1 View Result Date: 05/20/2024 CLINICAL DATA:  NG tube placement EXAM: PORTABLE ABDOMEN - 1 VIEW COMPARISON:  CT 05/20/2024 FINDINGS: Enteric tube tip folded back upon itself and directed cranially in the region of the distal esophagus. Heterogeneous airspace disease at the lung bases. Moderate air distension of the stomach IMPRESSION: Enteric tube tip folded back upon itself and directed cranially in the region of the distal esophagus. Recommend repositioning. Electronically Signed   By: Luke Bun M.D.   On: 05/20/2024 22:13   CT Head Wo Contrast Result Date: 05/20/2024 CLINICAL DATA:  Mental status changes, unknown cause. EXAM: CT HEAD WITHOUT CONTRAST TECHNIQUE: Contiguous axial images were obtained from the base of the skull through the vertex without intravenous contrast. RADIATION DOSE REDUCTION: This exam was performed according to the departmental dose-optimization program which includes automated exposure control, adjustment of the mA and/or kV according to patient size and/or use of iterative reconstruction technique. COMPARISON:  Head CT 12/31/2023. FINDINGS: Brain: There is mild cerebellar volume loss, small chronic right superior cerebellar lacunar infarct. There is moderately advanced cerebral atrophy and atrophic ventriculomegaly with moderate to severe small vessel disease of cerebral white matter. Chronic dystrophic calcification again noted in the frontal falx, senescent mineralization in the basal ganglia. No acute cortical based infarct, hemorrhage, mass or mass effect is seen. There is no midline shift.  Basal cisterns are patent. Vascular: There are calcifications in the distal vertebral arteries and heavily in the siphons. No hyperdense central vessels. Skull: Negative for fractures or focal lesions. Sinuses/Orbits: No acute findings. Old lens replacements with otherwise negative orbits. Other: None. IMPRESSION: 1. No acute  intracranial CT findings or interval changes. 2. Atrophy and small-vessel disease with atrophic ventriculomegaly. Stable exam. 3. Atherosclerosis. Electronically Signed   By: Francis Quam M.D.   On: 05/20/2024 21:49   CT CHEST ABDOMEN PELVIS WO CONTRAST Addendum Date: 05/20/2024 ADDENDUM REPORT: 05/20/2024 21:28 ADDENDUM: Critical Value/emergent results were called by telephone at the time of interpretation on 05/20/2024 at 9:27 pm to provider BERNARDINO FIREMAN , who verbally acknowledged these results. Electronically Signed   By: Leita Birmingham M.D.   On: 05/20/2024 21:28   Result Date: 05/20/2024 CLINICAL DATA:  Sepsis. EXAM: CT CHEST, ABDOMEN AND PELVIS WITHOUT CONTRAST TECHNIQUE: Multidetector CT imaging of the chest, abdomen and pelvis was performed following the standard protocol without IV contrast. RADIATION DOSE REDUCTION: This exam was performed according to the departmental dose-optimization program  which includes automated exposure control, adjustment of the mA and/or kV according to patient size and/or use of iterative reconstruction technique. COMPARISON:  09/17/2007. FINDINGS: CT CHEST FINDINGS Cardiovascular: The heart is normal in size and there is a trace pericardial effusion. Three-vessel coronary artery calcifications are noted. There is atherosclerotic calcification of the aorta without evidence of aneurysm. The pulmonary trunk is normal in caliber. Mediastinum/Nodes: No mediastinal or axillary lymphadenopathy. The thyroid  gland and trachea are within normal limits. Esophagus is mildly distended with fluid. Lungs/Pleura: Scattered airspace opacities are noted in the right lung with consolidation in the right upper and lower lobes. There is consolidation in the left lower lobe with mild airspace disease in the left upper lobe. There is a 3 mm nodule in the left upper lobe, axial image 38. No effusion or pneumothorax is seen. Musculoskeletal: Gynecomastia is noted. Degenerative changes are present in  the thoracic spine. No acute osseous abnormality. CT ABDOMEN PELVIS FINDINGS Hepatobiliary: No focal liver abnormality is seen. No gallstones, gallbladder wall thickening, or biliary dilatation. Pancreas: There is a vague hypodensity in the head of the pancreas measuring 1.8 x 1.2 cm. No pancreatic ductal dilatation or surrounding inflammatory changes. Spleen: Normal in size without focal abnormality. Adrenals/Urinary Tract: The adrenal glands are within normal limits. No renal calculus or hydronephrosis bilaterally. A Foley catheter is present in the urinary bladder. Stomach/Bowel: The stomach is distended with fluid. Multiple dilated loops of small bowel are present in the abdomen measuring up to 4.4 cm in diameter. A suspected transition point is seen in the mid pelvis, sagittal image 71, coronal image 74. There is a left inguinal hernia containing nonobstructed small bowel. No free air or pneumatosis is seen. A moderate amount of retained stool is noted in the rectum. Surgical changes are present at the cecum and the appendix is not seen. Vascular/Lymphatic: Aortic atherosclerosis. No enlarged abdominal or pelvic lymph nodes. Reproductive: The prostate gland is enlarged. Other: No abdominopelvic ascites. There is a left inguinal hernia containing nonobstructed small bowel. Musculoskeletal: Degenerative changes are present in the lumbar spine. No acute osseous abnormality is seen. IMPRESSION: 1. Multiple dilated loops of small bowel in the abdomen with a suspected transition point in the mid pelvis, concerning for small bowel obstruction. Surgical consultation is recommended. 2. Scattered airspace disease in the lungs bilaterally with consolidation in the right upper and lower lobes and left lower lobe, suggesting pneumonia. 3. Vague hypodensity in the head of the pancreas measuring 1.8 cm. Nonemergent MRI is recommended for further characterization. 4. Multi-vessel coronary artery calcifications. 5. Aortic  atherosclerosis. Electronically Signed: By: Leita Birmingham M.D. On: 05/20/2024 21:15   DG Chest Port 1 View Result Date: 05/20/2024 CLINICAL DATA:  Possible sepsis.  Respiratory distress. EXAM: PORTABLE CHEST 1 VIEW COMPARISON:  12/31/2023. FINDINGS: The heart size and mediastinal contours are within normal limits. There is atherosclerotic calcification of the aorta. Scattered airspace disease is noted in the perihilar region on the right and at the left lung base. No effusion or pneumothorax is seen. No acute osseous abnormality. IMPRESSION: Scattered airspace disease in the perihilar region on the right and at the left lung base, concerning for pneumonia. Electronically Signed   By: Leita Birmingham M.D.   On: 05/20/2024 19:58    EKG:  Personally reviewed sinus tachycardia, with PAC, RAE, LVH, diffuse ST depression, aVR elevation,  Telemetry: Appears to be in A-fib rate controlled  ED Course:  On arrival was in respiratory distress and was placed on BiPAP  was ultimately able to be weaned off to room air with adequate work of breathing and maintaining sats.  NG T was placed for small bowel obstruction.  Case was discussed with general surgery, plan for n.p.o. decompression, Gastrografin .  He had a witnessed seizure event with right arm flexion and left gaze deviation.  He was treated with Ativan  and loaded on Keppra , seen by neurology.   Assessment/Plan:  89 y.o. male with hx dementia, hypertension, hyperlipidemia, diabetes type 2, CKD 4, peripheral artery disease, carotid stenosis on anticoagulation, TIA, who was brought in by family due to acute onset respiratory distress, worsening mental status with unresponsiveness.  Complex presentation including respiratory distress requiring BiPAP likely related to acute aspiration, aspiration pneumonia, Small bowel obstruction, witnessed seizure new onset, high lactic acidosis, and continued depressed mental status.   Decreased level of consciousness Concern  re: airway protection - ICU consultation, Discussed with Dr. Maree feel he may be better suited under ICU level of care for close airway monitoring considering he has small bowel obstruction and already aspiration leading to respiratory distress earlier.   -For now I have canceled out stepdown order, appreciate ICU input -> update ICU planning to intubate for airway protection and will be admitted to ICU.  Seizure, new onset Witnessed seizure episode in the ED with right arm flexion and left gaze deviation.  He was treated with Ativan  1 mg followed by 2 mg, and loaded with Keppra  2.25 g IV, seen by neurology -Neurology following for management, Spot EEG read pending, ordered for MRI - AED per neurology service - Ativan  2 mg prn for seizure  - Seizure precautions  Respiratory distress Acute aspiration, aspiration pneumonia Acute hypoxic respiratory failure On EMS arrival his sats were in the 60s, he was placed on BiPAP upon arrival to the ED and ultimately able to be weaned off to room air.  CT of the chest demonstrates scattered bilateral airspace disease with consolidation in the right upper and lower lobe.  In the setting of his decreased mental status and small bowel obstruction this likely represents aspiration pneumonia. - Placed on broad antibiotics with vancomycin , cefepime ; would change off of cefepime  due to seizures.  Continue on vancomycin . - Sputum culture, MRSA nares, scheduled nebs, chest PT  Small bowel obstruction CT abdomen pelvis demonstrating small bowel obstruction with transition point in the mid abdomen. -General Surgery consulted by EDP, plan for NG decompression, n.p.o., Gastrografin  -Because NG is still putting out a large amount of fluid I asked RN to hold off on Gastrografin  -Continue NGT to low intermittent suction  Severe lactic acidosis Likely combination of severe volume depletion, hypoxia and respiratory distress, seizure.  There is no pneumatosis on CT imaging  of bowels. - He was treated with 4 L IV fluid and placed on maintenance rate while on insulin  drip per below. - Trend lactate every 4 hour  Severe range hyperglycemia Question HHS v hyperosmolarity driven by severe dehydration History of diabetes type 2 On initial evaluation glucose 411, VBG 7.41/31, bicarb 16, AG 24, Lactate 7.8 -> 9.6 -> 7.9, Osm 360, beta hydroxybutyrate within normal limit.  - Placed on insulin  drip per HHS protocol, anticipate should be able to transition off to basal insulin  soon given his rapid improvement in sugars. -Serial BMP every 4 hour, K repletion as needed -IV fluids per above, remain on maintenance rate while on insulin  drip.  Acute myocardial injury High-sensitivity troponin 37 -> 45.  EKG has diffuse ischemic changes with ST depression in  aVR elevation.  Suspect this is a demand event from the setting of his critical illness.  -Continue to trend troponin -Management of problems per above  AKI stage I, Likely developing renal failure Uremia CKD stage IV Baseline creatinine is approximately 2.9.  Elevated to 3.7 on admission.  Associated uremia with BUN 108. -Close monitoring of I's and O's, bladder scan, consider renal ultrasound / foley for close monitoring.   Chronic medical problems: Dementia: Noted, not currently on medication for this Hypertension: Holding home antihypertensives including amlodipine , hydralazine , hydrochlorothiazide Hyperlipidemia: Holding his home rosuvastatin  Diabetes type 2: Currently on insulin  drip per above peripheral artery disease /carotid artery disease: on aspirin , Xarelto  outpatient, currently hold with evaluation for underlying stroke. History of TIA: See above, holding antiplatelet and rosuvastatin    There is no height or weight on file to calculate BMI.    DVT prophylaxis:  SCDs Code Status:  Full Code; presumed full  Diet:  Diet Orders (From admission, onward)     Start     Ordered   05/21/24 0021  Diet NPO  time specified  Diet effective now        05/21/24 0021           Family Communication:  None   Consults:  ICU   Admission status:   Inpatient, Initial order for stepdown -> ICU consult and now planned for ICU admission.   Severity of Illness: The appropriate patient status for this patient is INPATIENT. Inpatient status is judged to be reasonable and necessary in order to provide the required intensity of service to ensure the patient's safety. The patient's presenting symptoms, physical exam findings, and initial radiographic and laboratory data in the context of their chronic comorbidities is felt to place them at high risk for further clinical deterioration. Furthermore, it is not anticipated that the patient will be medically stable for discharge from the hospital within 2 midnights of admission.   * I certify that at the point of admission it is my clinical judgment that the patient will require inpatient hospital care spanning beyond 2 midnights from the point of admission due to high intensity of service, high risk for further deterioration and high frequency of surveillance required.*   Dorn Dawson, MD [facility]  How to contact the [facility] Attending or Consulting provider 7A - 7P or covering provider during after hours 7P -7A, for this patient.  Check the care team in [facility] and look for a) attending/consulting [facility] provider listed and b) the [facility] team listed Log into [email or web address] and use [facility]'s universal password to access. If you do not have the password, please contact the hospital operator. Locate the [facility] provider you are looking for under [facility] and page to a number that you can be directly reached. If you still have difficulty reaching the provider, please page the [facility] (Director on Call) for the Hospitalists listed on amion for assistance.  05/21/2024, 1:27 AM    CRITICAL CARE Performed by: Dorn Dawson   Total critical care time: 40  minutes  Critical care time was exclusive of separately billable procedures and treating other patients.  Critical care was necessary to treat or prevent imminent or life-threatening deterioration.  Critical care was time spent personally by me on the following activities: development of treatment plan with patient and/or surrogate as well as nursing, discussions with consultants, evaluation of patient's response to treatment, examination of patient, obtaining history from patient or surrogate, ordering and performing treatments and interventions, ordering and  review of laboratory studies, ordering and review of radiographic studies, pulse oximetry and re-evaluation of patient's condition.

## 2024-05-21 NOTE — Progress Notes (Signed)
 Pt transported from ED Resus via ventilator to 3M09 without complications.

## 2024-05-21 NOTE — Consult Note (Signed)
 Reason for Consult:SBO Referring Physician: Dr. Griselda Victory KATHEE Carvin is an 89 y.o. male.  HPI: Patient is an 89 year old male with a history of dementia hypertension hyperlipidemia diabetes carotid stenosis, history of TIAs who was brought in by family secondary to acute respiratory distress and worsening mental status  Per chart review patient was having shortness of breath earlier today.  He did appear to be confused per EMS on their arrival.  Per the patient's family over the past week he has seemed to be weak.  He was started on antibiotics for UTI.  Patient had more labored breathing the day of admission.  Per evaluation in the ER he underwent CT workup and lab studies.  Patient with multiple loops of small bowel with possible transition point in the mid pelvis.  There was concern for possible SBO.  Past Medical History:  Diagnosis Date   Carotid artery stenosis    Right   CKD (chronic kidney disease), stage IV (HCC)    Colon cancer (HCC) 10/12/2004   stage III, s/p hemicolectomy   Dementia (HCC)    Diabetes mellitus    HLD (hyperlipidemia)    HTN (hypertension)    Peripheral vascular disease (HCC)     Past Surgical History:  Procedure Laterality Date   hemocolectomy  2005    Family History  Problem Relation Age of Onset   Cancer Mother    Diabetes Father    Cancer Sister     Social History:  reports that he has never smoked. He has never used smokeless tobacco. He reports that he does not drink alcohol and does not use drugs.  Allergies: No Known Allergies  Medications: I have reviewed the patient's current medications.  Results for orders placed or performed during the hospital encounter of 05/20/24 (from the past 48 hours)  Resp panel by RT-PCR (RSV, Flu A&B, Covid) Anterior Nasal Swab     Status: None   Collection Time: 05/20/24  5:37 PM   Specimen: Anterior Nasal Swab  Result Value Ref Range   SARS Coronavirus 2 by RT PCR NEGATIVE NEGATIVE   Influenza A by  PCR NEGATIVE NEGATIVE   Influenza B by PCR NEGATIVE NEGATIVE    Comment: (NOTE) The Xpert Xpress SARS-CoV-2/FLU/RSV plus assay is intended as an aid in the diagnosis of influenza from Nasopharyngeal swab specimens and should not be used as a sole basis for treatment. Nasal washings and aspirates are unacceptable for Xpert Xpress SARS-CoV-2/FLU/RSV testing.  Fact Sheet for Patients: BloggerCourse.com  Fact Sheet for Healthcare Providers: SeriousBroker.it  This test is not yet approved or cleared by the [place]  FDA and has been authorized for detection and/or diagnosis of SARS-CoV-2 by FDA under an Emergency Use Authorization (EUA). This EUA will remain in effect (meaning this test can be used) for the duration of the COVID-19 declaration under Section 564(b)(1) of the Act, 21 U.S.C. section 360bbb-3(b)(1), unless the authorization is terminated or revoked.     Resp Syncytial Virus by PCR NEGATIVE NEGATIVE    Comment: (NOTE) Fact Sheet for Patients: BloggerCourse.com  Fact Sheet for Healthcare Providers: SeriousBroker.it  This test is not yet approved or cleared by the [place]  FDA and has been authorized for detection and/or diagnosis of SARS-CoV-2 by FDA under an Emergency Use Authorization (EUA). This EUA will remain in effect (meaning this test can be used) for the duration of the COVID-19 declaration under Section 564(b)(1) of the Act, 21 U.S.C. section 360bbb-3(b)(1), unless the authorization is terminated or  revoked.  Performed at [facility], [address] [place], [place] [address]   Urinalysis, w/ Reflex to Culture (Infection Suspected) -Urine, Catheterized     Status: Abnormal   Collection Time: 05/20/24  6:00 PM  Result Value Ref Range   Specimen Source URINE, CATHETERIZED    Color, Urine YELLOW YELLOW   APPearance CLEAR CLEAR   Specific  Gravity, Urine 1.009 1.005 - 1.030   pH 5.0 5.0 - 8.0   Glucose, UA >=500 (A) NEGATIVE mg/dL   Hgb urine dipstick NEGATIVE NEGATIVE   Bilirubin Urine NEGATIVE NEGATIVE   Ketones, ur NEGATIVE NEGATIVE mg/dL   Protein, ur 899 (A) NEGATIVE mg/dL   Nitrite NEGATIVE NEGATIVE   Leukocytes,Ua NEGATIVE NEGATIVE   RBC / HPF 0-5 0 - 5 RBC/hpf   WBC, UA 0-5 0 - 5 WBC/hpf    Comment:        Reflex urine culture not performed if WBC <=10, OR if Squamous epithelial cells >5. If Squamous epithelial cells >5 suggest recollection.    Bacteria, UA NONE SEEN NONE SEEN   Squamous Epithelial / HPF 0-5 0 - 5 /HPF   Mucus PRESENT     Comment: Performed at [facility], [address], [place], [place] [address]  Comprehensive metabolic panel     Status: Abnormal   Collection Time: 05/20/24  6:04 PM  Result Value Ref Range   Sodium 143 135 - 145 mmol/L   Potassium 3.9 3.5 - 5.1 mmol/L   Chloride 103 98 - 111 mmol/L   CO2 16 (L) 22 - 32 mmol/L   Glucose, Bld 411 (H) 70 - 99 mg/dL    Comment: Glucose reference range applies only to samples taken after fasting for at least 8 hours.   BUN 108 (H) 8 - 23 mg/dL   Creatinine, Ser 6.23 (H) 0.61 - 1.24 mg/dL   Calcium  10.4 (H) 8.9 - 10.3 mg/dL   Total Protein 7.1 6.5 - 8.1 g/dL   Albumin 3.0 (L) 3.5 - 5.0 g/dL   AST 24 15 - 41 U/L   ALT 11 0 - 44 U/L   Alkaline Phosphatase 82 38 - 126 U/L   Total Bilirubin 0.5 0.0 - 1.2 mg/dL   GFR, Estimated 15 (L) >60 mL/min    Comment: (NOTE) Calculated using the CKD-EPI Creatinine Equation (2021)    Anion gap 24 (H) 5 - 15    Comment: ELECTROLYTES REPEATED TO VERIFY Performed at [facility], [address] [place], [place] [address]   Protime-INR     Status: Abnormal   Collection Time: 05/20/24  6:04 PM  Result Value Ref Range   Prothrombin Time 16.3 (H) 11.4 - 15.2 seconds   INR 1.2 0.8 - 1.2    Comment: (NOTE) INR goal varies based on device and disease states. Performed at [facility], [address], [place], [place] [address]   Troponin I (High Sensitivity)     Status: Abnormal   Collection Time: 05/20/24  6:04 PM  Result Value Ref Range   Troponin I (High Sensitivity) 37 (H) <18 ng/L    Comment: (NOTE) Elevated high sensitivity troponin I (hsTnI) values and significant  changes across serial measurements may suggest ACS but many other  chronic and acute conditions are known to elevate hsTnI results.  Refer to the Links section for chest pain algorithms and additional  guidance. Performed at [facility], [address], [place], [place] [address]  Magnesium      Status: Abnormal   Collection Time: 05/20/24  6:04 PM  Result Value Ref Range   Magnesium  3.6 (H) 1.7 - 2.4 mg/dL    Comment: Performed at [facility], [address], [place], [place] [address]  Beta-hydroxybutyric acid     Status: None   Collection Time: 05/20/24  6:04 PM  Result Value Ref Range   Beta-Hydroxybutyric Acid 0.25 0.05 - 0.27 mmol/L    Comment: Performed at [facility], [address], [place], [place] [address]  Osmolality     Status: Abnormal   Collection Time: 05/20/24  6:04 PM  Result Value Ref Range   Osmolality 360 (HH) 275 - 295 mOsm/kg    Comment: REPEATED TO VERIFY CRITICAL RESULT CALLED TO, READ BACK BY AND VERIFIED WITH: K.SHEETS,RN @1856  05/20/2024 [email or web address] Performed at [facility], [address] [place], [place] [address]   I-Stat venous blood gas, ED ([facility])     Status: Abnormal   Collection Time: 05/20/24  6:30 PM  Result Value Ref Range   pH, Ven 7.413 7.25 - 7.43   pCO2, Ven 31.9 (L) 44 - 60 mmHg   pO2, Ven 70 (H) 32 - 45 mmHg   Bicarbonate 20.3 20.0 - 28.0 mmol/L   TCO2 21 (L) 22 - 32 mmol/L   O2 Saturation 94 %   Acid-base deficit 3.0 (H) 0.0 - 2.0 mmol/L   Sodium 138 135 - 145 mmol/L   Potassium 4.7 3.5 - 5.1 mmol/L   Calcium , Ion 1.09 (L) 1.15 - 1.40 mmol/L   HCT 36.0 (L) 39.0 - 52.0 %   Hemoglobin 12.2 (L) 13.0 -  17.0 g/dL   Sample type VENOUS   I-Stat Chem 8, ED     Status: Abnormal   Collection Time: 05/20/24  6:30 PM  Result Value Ref Range   Sodium 139 135 - 145 mmol/L   Potassium 4.6 3.5 - 5.1 mmol/L   Chloride 105 98 - 111 mmol/L   BUN 124 (H) 8 - 23 mg/dL   Creatinine, Ser 6.09 (H) 0.61 - 1.24 mg/dL   Glucose, Bld 608 (H) 70 - 99 mg/dL    Comment: Glucose reference range applies only to samples taken after fasting for at least 8 hours.   Calcium , Ion 1.10 (L) 1.15 - 1.40 mmol/L   TCO2 21 (L) 22 - 32 mmol/L   Hemoglobin 12.9 (L) 13.0 - 17.0 g/dL   HCT 61.9 (L) 60.9 - 47.9 %  I-Stat Lactic Acid, ED     Status: Abnormal   Collection Time: 05/20/24  6:31 PM  Result Value Ref Range   Lactic Acid, Venous 7.8 (HH) 0.5 - 1.9 mmol/L   Comment NOTIFIED PHYSICIAN   CBC with Differential/Platelet     Status: Abnormal   Collection Time: 05/20/24  7:57 PM  Result Value Ref Range   WBC 11.4 (H) 4.0 - 10.5 K/uL   RBC 4.36 4.22 - 5.81 MIL/uL   Hemoglobin 11.3 (L) 13.0 - 17.0 g/dL   HCT 65.1 (L) 60.9 - 47.9 %   MCV 79.8 (L) 80.0 - 100.0 fL   MCH 25.9 (L) 26.0 - 34.0 pg   MCHC 32.5 30.0 - 36.0 g/dL   RDW 84.5 88.4 - 84.4 %   Platelets 320 150 - 400 K/uL   nRBC 0.0 0.0 - 0.2 %   Neutrophils Relative % 92 %   Neutro Abs 10.5 (H) 1.7 - 7.7 K/uL   Lymphocytes Relative  4 %   Lymphs Abs 0.5 (L) 0.7 - 4.0 K/uL   Monocytes Relative 4 %   Monocytes Absolute 0.4 0.1 - 1.0 K/uL   Eosinophils Relative 0 %   Eosinophils Absolute 0.0 0.0 - 0.5 K/uL   Basophils Relative 0 %   Basophils Absolute 0.0 0.0 - 0.1 K/uL   WBC Morphology FEW NEURTOPHIL BANDS NOTED    RBC Morphology MORPHOLOGY UNREMARKABLE    Smear Review MORPHOLOGY UNREMARKABLE    Immature Granulocytes 0 %   Abs Immature Granulocytes 0.04 0.00 - 0.07 K/uL    Comment: Performed at [facility], [address], [place], [place] [address]  Brain natriuretic peptide     Status: None   Collection Time: 05/20/24  7:57 PM  Result Value Ref  Range   B Natriuretic Peptide 22.1 0.0 - 100.0 pg/mL    Comment: Performed at [facility], [address] [place], [place] [address]  Troponin I (High Sensitivity)     Status: Abnormal   Collection Time: 05/20/24  7:57 PM  Result Value Ref Range   Troponin I (High Sensitivity) 45 (H) <18 ng/L    Comment: (NOTE) Elevated high sensitivity troponin I (hsTnI) values and significant  changes across serial measurements may suggest ACS but many other  chronic and acute conditions are known to elevate hsTnI results.  Refer to the Links section for chest pain algorithms and additional  guidance. Performed at [facility], [address] [place], [place] [address]   CBG monitoring, ED     Status: Abnormal   Collection Time: 05/20/24  8:02 PM  Result Value Ref Range   Glucose-Capillary 425 (H) 70 - 99 mg/dL    Comment: Glucose reference range applies only to samples taken after fasting for at least 8 hours.  I-Stat Lactic Acid, ED     Status: Abnormal   Collection Time: 05/20/24  8:18 PM  Result Value Ref Range   Lactic Acid, Venous 9.6 (HH) 0.5 - 1.9 mmol/L   Comment NOTIFIED PHYSICIAN   CBG monitoring, ED     Status: Abnormal   Collection Time: 05/20/24  8:57 PM  Result Value Ref Range   Glucose-Capillary 367 (H) 70 - 99 mg/dL    Comment: Glucose reference range applies only to samples taken after fasting for at least 8 hours.  CBG monitoring, ED     Status: Abnormal   Collection Time: 05/20/24  9:59 PM  Result Value Ref Range   Glucose-Capillary 262 (H) 70 - 99 mg/dL    Comment: Glucose reference range applies only to samples taken after fasting for at least 8 hours.  I-Stat CG4 Lactic Acid     Status: Abnormal   Collection Time: 05/20/24 10:32 PM  Result Value Ref Range   Lactic Acid, Venous 7.9 (HH) 0.5 - 1.9 mmol/L   Comment NOTIFIED PHYSICIAN   CBG monitoring, ED     Status: Abnormal   Collection Time: 05/20/24 10:59 PM  Result Value Ref Range    Glucose-Capillary 307 (H) 70 - 99 mg/dL    Comment: Glucose reference range applies only to samples taken after fasting for at least 8 hours.  Basic metabolic panel with GFR     Status: Abnormal   Collection Time: 05/20/24 11:42 PM  Result Value Ref Range   Sodium 145 135 - 145 mmol/L   Potassium 3.8 3.5 - 5.1 mmol/L   Chloride 108 98 - 111 mmol/L   CO2 19 (L)  22 - 32 mmol/L   Glucose, Bld 140 (H) 70 - 99 mg/dL    Comment: Glucose reference range applies only to samples taken after fasting for at least 8 hours.   BUN 104 (H) 8 - 23 mg/dL   Creatinine, Ser 6.54 (H) 0.61 - 1.24 mg/dL   Calcium  9.6 8.9 - 10.3 mg/dL   GFR, Estimated 16 (L) >60 mL/min    Comment: (NOTE) Calculated using the CKD-EPI Creatinine Equation (2021)    Anion gap 18 (H) 5 - 15    Comment: ELECTROLYTES REPEATED TO VERIFY Performed at [facility], [address], [place], [place] [address]   CBG monitoring, ED     Status: Abnormal   Collection Time: 05/21/24 12:03 AM  Result Value Ref Range   Glucose-Capillary 130 (H) 70 - 99 mg/dL    Comment: Glucose reference range applies only to samples taken after fasting for at least 8 hours.  CBG monitoring, ED     Status: Abnormal   Collection Time: 05/21/24  1:05 AM  Result Value Ref Range   Glucose-Capillary 101 (H) 70 - 99 mg/dL    Comment: Glucose reference range applies only to samples taken after fasting for at least 8 hours.  MRSA Next Gen by PCR, Nasal     Status: None   Collection Time: 05/21/24  1:18 AM   Specimen: Nasal Mucosa; Nasal Swab  Result Value Ref Range   MRSA by PCR Next Gen NOT DETECTED NOT DETECTED    Comment: (NOTE) The GeneXpert MRSA Assay (FDA approved for NASAL specimens only), is one component of a comprehensive MRSA colonization surveillance program. It is not intended to diagnose MRSA infection nor to guide or monitor treatment for MRSA infections. Test performance is not FDA approved in patients less than 40  years old. Performed at [facility], [address], [place], [place] [address]   Lactic acid, plasma     Status: Abnormal   Collection Time: 05/21/24  1:19 AM  Result Value Ref Range   Lactic Acid, Venous 5.4 (HH) 0.5 - 1.9 mmol/L    Comment: FIRST CALL ATTEMPT AT 0228 CRITICAL RESULT CALLED TO, READ BACK BY AND VERIFIED WITH I.LANE RN [phone] M. [facility] Performed at [facility], [address], [place], [place] [address]   Troponin I (High Sensitivity)     Status: Abnormal   Collection Time: 05/21/24  1:19 AM  Result Value Ref Range   Troponin I (High Sensitivity) 56 (H) <18 ng/L    Comment: (NOTE) Elevated high sensitivity troponin I (hsTnI) values and significant  changes across serial measurements may suggest ACS but many other  chronic and acute conditions are known to elevate hsTnI results.  Refer to the Links section for chest pain algorithms and additional  guidance. Performed at [facility], [address], [place], [place] [address]   CBG monitoring, ED     Status: Abnormal   Collection Time: 05/21/24  2:11 AM  Result Value Ref Range   Glucose-Capillary 112 (H) 70 - 99 mg/dL    Comment: Glucose reference range applies only to samples taken after fasting for at least 8 hours.  CBG monitoring, ED     Status: Abnormal   Collection Time: 05/21/24  3:15 AM  Result Value Ref Range   Glucose-Capillary 146 (H) 70 - 99 mg/dL    Comment: Glucose reference range applies only to samples taken after fasting for at least 8 hours.  I-Stat arterial blood gas, ED     Status: Abnormal   Collection Time: 05/21/24  3:17 AM  Result Value Ref Range   pH, Arterial 7.406 7.35 - 7.45   pCO2 arterial 35.2 32 - 48 mmHg   pO2, Arterial 431 (H) 83 - 108 mmHg   Bicarbonate 22.0 20.0 - 28.0 mmol/L   TCO2 23 22 - 32 mmol/L   O2 Saturation 100 %   Acid-base deficit 2.0 0.0 - 2.0 mmol/L   Sodium 142 135 - 145 mmol/L   Potassium 3.8 3.5 - 5.1 mmol/L   Calcium , Ion  1.30 1.15 - 1.40 mmol/L   HCT 26.0 (L) 39.0 - 52.0 %   Hemoglobin 8.8 (L) 13.0 - 17.0 g/dL   Patient temperature 62.6 C    Sample type ARTERIAL   CBC     Status: Abnormal   Collection Time: 05/21/24  4:44 AM  Result Value Ref Range   WBC 15.8 (H) 4.0 - 10.5 K/uL   RBC 3.10 (L) 4.22 - 5.81 MIL/uL   Hemoglobin 8.2 (L) 13.0 - 17.0 g/dL    Comment: REPEATED TO VERIFY   HCT 24.8 (L) 39.0 - 52.0 %   MCV 80.0 80.0 - 100.0 fL   MCH 26.5 26.0 - 34.0 pg   MCHC 33.1 30.0 - 36.0 g/dL   RDW 84.5 88.4 - 84.4 %   Platelets 198 150 - 400 K/uL   nRBC 0.0 0.0 - 0.2 %    Comment: Performed at [facility], [address], [place], [place] [address]  Magnesium      Status: None   Collection Time: 05/21/24  4:44 AM  Result Value Ref Range   Magnesium  2.2 1.7 - 2.4 mg/dL    Comment: Performed at [facility], [address], [place], [place] [address]  Phosphorus     Status: None   Collection Time: 05/21/24  4:44 AM  Result Value Ref Range   Phosphorus 3.6 2.5 - 4.6 mg/dL    Comment: Performed at [facility], [address] [place], [place] [address]  TSH     Status: None   Collection Time: 05/21/24  4:44 AM  Result Value Ref Range   TSH 0.979 0.350 - 4.500 uIU/mL    Comment: Performed by a 3rd Generation assay with a functional sensitivity of <=0.01 uIU/mL. Performed at [facility], [address], [place], [place] [address]   Lactic acid, plasma     Status: Abnormal   Collection Time: 05/21/24  4:44 AM  Result Value Ref Range   Lactic Acid, Venous 3.1 (HH) 0.5 - 1.9 mmol/L    Comment: CRITICAL RESULT CALLED TO, READ BACK BY AND VERIFIED WITH I.LANE RN [phone] M. [facility] Performed at [facility], [address] [place], [place] [address]   Comprehensive metabolic panel with GFR     Status: Abnormal   Collection Time: 05/21/24  4:44 AM  Result Value Ref Range   Sodium 135 135 - 145 mmol/L    Comment: DELTA CHECK NOTED   Potassium 3.7 3.5 - 5.1 mmol/L    Chloride 104 98 - 111 mmol/L   CO2 19 (L) 22 - 32 mmol/L   Glucose, Bld 182 (H) 70 - 99 mg/dL    Comment: Glucose reference range applies only to samples taken after fasting for at least 8 hours.   BUN 87 (H) 8 - 23 mg/dL   Creatinine, Ser 7.37 (H) 0.61 -  1.24 mg/dL   Calcium  8.0 (L) 8.9 - 10.3 mg/dL   Total Protein 4.5 (L) 6.5 - 8.1 g/dL   Albumin 1.9 (L) 3.5 - 5.0 g/dL   AST 14 (L) 15 - 41 U/L   ALT 8 0 - 44 U/L   Alkaline Phosphatase 47 38 - 126 U/L   Total Bilirubin 2.8 (H) 0.0 - 1.2 mg/dL   GFR, Estimated 23 (L) >60 mL/min    Comment: (NOTE) Calculated using the CKD-EPI Creatinine Equation (2021)    Anion gap 12 5 - 15    Comment: Performed at [facility], [address] [place], [place] [address]  CBG monitoring, ED     Status: Abnormal   Collection Time: 05/21/24  4:44 AM  Result Value Ref Range   Glucose-Capillary 176 (H) 70 - 99 mg/dL    Comment: Glucose reference range applies only to samples taken after fasting for at least 8 hours.    DG Chest Portable 1 View Result Date: 05/21/2024 CLINICAL DATA:  Intubation confirmation EXAM: PORTABLE CHEST 1 VIEW COMPARISON:  05/20/2024 FINDINGS: Endotracheal tube is been placed with tip measuring 6.2 cm above the carina. An enteric tube was placed. Tip projects over the expected location of the EG junction with proximal side hole in the distal esophageal region. Advancement is suggested. Heart size and pulmonary vascularity are normal. Perihilar infiltrates similar to prior study. No pleural effusion or pneumothorax. Calcification of the aorta. IMPRESSION: Endotracheal tube with tip above the carina. Enteric tube tip projects over the lower esophageal region and advancement is suggested. Persistent perihilar infiltrates. Electronically Signed   By: Elsie Gravely M.D.   On: 05/21/2024 02:42   DG Abd Portable 1V-Small Bowel Protocol-Position Verification Result Date: 05/20/2024 CLINICAL DATA:  NG tube placement confirmation  EXAM: PORTABLE ABDOMEN - 1 VIEW COMPARISON:  05/20/2024 FINDINGS: Limited field of view for tube placement verification purposes. The enteric tube has been advanced with tip now projecting over the left upper quadrant consistent with location in the body of the stomach. Mild gaseous distention of the stomach. IMPRESSION: Enteric tube has been advanced with tip now projecting over the left upper quadrant consistent with location in the body of the stomach. Electronically Signed   By: Elsie Gravely M.D.   On: 05/20/2024 23:00   DG Abd Portable 1 View Result Date: 05/20/2024 CLINICAL DATA:  NG tube placement EXAM: PORTABLE ABDOMEN - 1 VIEW COMPARISON:  CT 05/20/2024 FINDINGS: Enteric tube tip folded back upon itself and directed cranially in the region of the distal esophagus. Heterogeneous airspace disease at the lung bases. Moderate air distension of the stomach IMPRESSION: Enteric tube tip folded back upon itself and directed cranially in the region of the distal esophagus. Recommend repositioning. Electronically Signed   By: Luke Bun M.D.   On: 05/20/2024 22:13   CT Head Wo Contrast Result Date: 05/20/2024 CLINICAL DATA:  Mental status changes, unknown cause. EXAM: CT HEAD WITHOUT CONTRAST TECHNIQUE: Contiguous axial images were obtained from the base of the skull through the vertex without intravenous contrast. RADIATION DOSE REDUCTION: This exam was performed according to the departmental dose-optimization program which includes automated exposure control, adjustment of the mA and/or kV according to patient size and/or use of iterative reconstruction technique. COMPARISON:  Head CT 12/31/2023. FINDINGS: Brain: There is mild cerebellar volume loss, small chronic right superior cerebellar lacunar infarct. There is moderately advanced cerebral atrophy and atrophic ventriculomegaly with moderate to severe small vessel disease of cerebral white matter.  Chronic dystrophic calcification again noted in the  frontal falx, senescent mineralization in the basal ganglia. No acute cortical based infarct, hemorrhage, mass or mass effect is seen. There is no midline shift.  Basal cisterns are patent. Vascular: There are calcifications in the distal vertebral arteries and heavily in the siphons. No hyperdense central vessels. Skull: Negative for fractures or focal lesions. Sinuses/Orbits: No acute findings. Old lens replacements with otherwise negative orbits. Other: None. IMPRESSION: 1. No acute intracranial CT findings or interval changes. 2. Atrophy and small-vessel disease with atrophic ventriculomegaly. Stable exam. 3. Atherosclerosis. Electronically Signed   By: Francis Quam M.D.   On: 05/20/2024 21:49   CT CHEST ABDOMEN PELVIS WO CONTRAST Addendum Date: 05/20/2024 ADDENDUM REPORT: 05/20/2024 21:28 ADDENDUM: Critical Value/emergent results were called by telephone at the time of interpretation on 05/20/2024 at 9:27 pm to provider BERNARDINO FIREMAN , who verbally acknowledged these results. Electronically Signed   By: Leita Birmingham M.D.   On: 05/20/2024 21:28   Result Date: 05/20/2024 CLINICAL DATA:  Sepsis. EXAM: CT CHEST, ABDOMEN AND PELVIS WITHOUT CONTRAST TECHNIQUE: Multidetector CT imaging of the chest, abdomen and pelvis was performed following the standard protocol without IV contrast. RADIATION DOSE REDUCTION: This exam was performed according to the departmental dose-optimization program which includes automated exposure control, adjustment of the mA and/or kV according to patient size and/or use of iterative reconstruction technique. COMPARISON:  09/17/2007. FINDINGS: CT CHEST FINDINGS Cardiovascular: The heart is normal in size and there is a trace pericardial effusion. Three-vessel coronary artery calcifications are noted. There is atherosclerotic calcification of the aorta without evidence of aneurysm. The pulmonary trunk is normal in caliber. Mediastinum/Nodes: No mediastinal or axillary lymphadenopathy. The  thyroid  gland and trachea are within normal limits. Esophagus is mildly distended with fluid. Lungs/Pleura: Scattered airspace opacities are noted in the right lung with consolidation in the right upper and lower lobes. There is consolidation in the left lower lobe with mild airspace disease in the left upper lobe. There is a 3 mm nodule in the left upper lobe, axial image 38. No effusion or pneumothorax is seen. Musculoskeletal: Gynecomastia is noted. Degenerative changes are present in the thoracic spine. No acute osseous abnormality. CT ABDOMEN PELVIS FINDINGS Hepatobiliary: No focal liver abnormality is seen. No gallstones, gallbladder wall thickening, or biliary dilatation. Pancreas: There is a vague hypodensity in the head of the pancreas measuring 1.8 x 1.2 cm. No pancreatic ductal dilatation or surrounding inflammatory changes. Spleen: Normal in size without focal abnormality. Adrenals/Urinary Tract: The adrenal glands are within normal limits. No renal calculus or hydronephrosis bilaterally. A Foley catheter is present in the urinary bladder. Stomach/Bowel: The stomach is distended with fluid. Multiple dilated loops of small bowel are present in the abdomen measuring up to 4.4 cm in diameter. A suspected transition point is seen in the mid pelvis, sagittal image 71, coronal image 74. There is a left inguinal hernia containing nonobstructed small bowel. No free air or pneumatosis is seen. A moderate amount of retained stool is noted in the rectum. Surgical changes are present at the cecum and the appendix is not seen. Vascular/Lymphatic: Aortic atherosclerosis. No enlarged abdominal or pelvic lymph nodes. Reproductive: The prostate gland is enlarged. Other: No abdominopelvic ascites. There is a left inguinal hernia containing nonobstructed small bowel. Musculoskeletal: Degenerative changes are present in the lumbar spine. No acute osseous abnormality is seen. IMPRESSION: 1. Multiple dilated loops of small  bowel in the abdomen with a suspected transition point in the mid  pelvis, concerning for small bowel obstruction. Surgical consultation is recommended. 2. Scattered airspace disease in the lungs bilaterally with consolidation in the right upper and lower lobes and left lower lobe, suggesting pneumonia. 3. Vague hypodensity in the head of the pancreas measuring 1.8 cm. Nonemergent MRI is recommended for further characterization. 4. Multi-vessel coronary artery calcifications. 5. Aortic atherosclerosis. Electronically Signed: By: Leita Birmingham M.D. On: 05/20/2024 21:15   DG Chest Port 1 View Result Date: 05/20/2024 CLINICAL DATA:  Possible sepsis.  Respiratory distress. EXAM: PORTABLE CHEST 1 VIEW COMPARISON:  12/31/2023. FINDINGS: The heart size and mediastinal contours are within normal limits. There is atherosclerotic calcification of the aorta. Scattered airspace disease is noted in the perihilar region on the right and at the left lung base. No effusion or pneumothorax is seen. No acute osseous abnormality. IMPRESSION: Scattered airspace disease in the perihilar region on the right and at the left lung base, concerning for pneumonia. Electronically Signed   By: Leita Birmingham M.D.   On: 05/20/2024 19:58    Review of Systems  Unable to perform ROS: Acuity of condition   Blood pressure (!) 172/74, pulse (!) 57, temperature 99.1 F (37.3 C), temperature source Oral, resp. rate 13, weight 65 kg, SpO2 100%. Physical Exam  PE:  Constitutional: No acute distress, intubated, appears states age. Eyes: Anicteric sclerae, moist conjunctiva, no lid lag Lungs: Clear to auscultation bilaterally, normal respiratory effort CV: regular rate and rhythm, no murmurs, no peripheral edema, pedal pulses 2+ GI: Soft, no masses or hepatosplenomegaly, non-tender to palpation Skin: No rashes, palpation reveals normal turgor    Assessment/Plan: 89 year old male with SBO Respiratory distress New onset seizure Lactic  acidosis AKI History of dementia History of hypertension History of hyperlipidemia History of diabetes  Patient was intubated secondary to decreased mental status. Patient NG tube placed.  SBO protocol ordered. Will continue to follow.    Lynda Leos 05/21/2024, 6:19 AM

## 2024-05-21 NOTE — Progress Notes (Signed)
 Pharmacy Antibiotic Note  Jonathan Baird is a 89 y.o. male admitted on 05/20/2024 with AKI, sepsis/PNA.  Pharmacy has been consulted for Vancomycin  and Zosyn  dosing.  Vancomycin  1 g IV given in ED at 7 pm  Plan: Vancomycin  500 mg IV now F/U renal fxn and redose as indicated Zosyn  2.25 g IV q8h      Temp (24hrs), Avg:97.9 F (36.6 C), Min:96.4 F (35.8 C), Max:99.4 F (37.4 C)  Recent Labs  Lab 05/20/24 1804 05/20/24 1830 05/20/24 1831 05/20/24 1957 05/20/24 2018 05/20/24 2232 05/20/24 2342  WBC  --   --   --  11.4*  --   --   --   CREATININE 3.76* 3.90*  --   --   --   --  3.45*  LATICACIDVEN  --   --  7.8*  --  9.6* 7.9*  --     CrCl cannot be calculated (Unknown ideal weight.).    No Known Allergies   Dail Cordella Misty 05/21/2024 2:12 AM

## 2024-05-21 NOTE — ED Notes (Signed)
 Surgeon at bedside.

## 2024-05-21 NOTE — Consult Note (Signed)
 NAME:  Jonathan Baird, MRN:  [id number], DOB:  [birth year], LOS: 0 ADMISSION DATE:  05/20/2024 CONSULTATION DATE:  05/21/2024 REFERRING MD:  Keturah - TRH, CHIEF COMPLAINT:  Acute hypoxemic respiratory failure in the setting of seizure, need for airway protection   History of Present Illness:  89 year old man who presented to [facility] ED 7/10 for respiratory distress. PMHx significant for HTN, HLD, T2DM, stage IV CKD, PVD, R ICA stenosis, colon CA s/p hemicolectomy, dementia. Recent brief admission 2/17-2/18 for progressive weakness and urinary frequency. Recently treated presumed UTI.  Patient initially presented to ED from home for respiratory distress. Reportedly in his USOH 7/8PM, but developed SOB and wet cough 7/9. On ED arrival, patient was afebrile with temp 99.83F, HR 130, BP 150/90, SpO2 64% on RA, improved to 89% with nebulizer and 4LNC. DuoNebs, Solumedrol and Mg administered. GCS 10. CBG 468. Labs were notable for WBC 11.4, Hgb 11.3, Plt 320. Na 143, K 3.9, CO2 16, BUN/Cr 108/3.76 (baseline Cr 2.8-2.9). LFTs WNL. INR 1.2. Trop 37 > 45. BNP 22.1. RVP negative. UA > 500 glucose, otherwise unremarkable. Serum Osm 360. B-hydroxybutyrate 0.25. LA 7.8 > 9.6 > 7.9. CXR with scattered airspace disease (perihilar) c/f PNA; CT Head NAICA. CT Chest/A/P demonstrated multiple loops of SB with suspected transition point mid-pelvis, scattered bilateral airspace disease in the lungs c/w PNA, vague pancreatic head density. Insulin  gtt was started for hyperglycemia and broad-spectrum antibiotics initiated (Vanc/Zosyn ).   While awaiting bed placement in ED, patient had a witnessed seizure lasting about 2 minutes with L eye deviation. Neuro was consulted. Spot EEG demonstrated diffuse slowing without electrographic seizures. Patient remained particularly somnolent and was poorly protecting his airway with gurgling respirations and decision was made to intubate.  PCCM consulted for ICU admission.  Pertinent Medical  History:   Past Medical History:  Diagnosis Date   Carotid artery stenosis    Right   CKD (chronic kidney disease), stage IV (HCC)    Colon cancer (HCC) 10/12/2004   stage III, s/p hemicolectomy   Dementia (HCC)    Diabetes mellitus    HLD (hyperlipidemia)    HTN (hypertension)    Peripheral vascular disease (HCC)    Significant Hospital Events: Including procedures, antibiotic start and stop dates in addition to other pertinent events   7/9 - Presented to ED with AMS, SOB. 7/10 - PCCM consulted for airway protection, intubated in ED.  Interim History / Subjective:  PCCM consulted for ICU admission.  Objective:  Blood pressure 139/80, pulse 79, temperature 99.1 F (37.3 C), temperature source Oral, resp. rate 18, weight 65 kg, SpO2 100%.    Vent Mode: PRVC FiO2 (%):  [40 %-100 %] 100 % Set Rate:  [10 bmp-18 bmp] 18 bmp Vt Set:  [580 mL] 580 mL PEEP:  [5 cmH20] 5 cmH20 Plateau Pressure:  [16 cmH20] 16 cmH20   Intake/Output Summary (Last 24 hours) at 05/21/2024 9687 Last data filed at 05/21/2024 9787 Gross per 24 hour  Intake 4629.52 ml  Output 2400 ml  Net 2229.52 ml   Filed Weights   05/21/24 0229  Weight: 65 kg   Physical Examination: General: Acute-on-chronically ill-appearing elderly man in NAD. HEENT: [place]/AT, anicteric sclera, pupils pinpoint, moist mucous membranes. Neuro: Lethargic. ?Post-ictal versus encephalopathic. Not following commands. No spontaneous movement of extremities noted. +Cough, +weak gag. CV: Irregularly irregular rhythm, rate 60s, no m/g/r. PULM: Breathing even and unlabored on [place]. Lung fields diminished at bases bilaterally. GI: Soft, nontender, nondistended. Normoactive bowel sounds. Extremities:  No sigificant LE edema noted. Skin: Warm/dry, no rashes.   Resolved Hospital Problem List:    Assessment & Plan:  Seizure, witnessed - Neuro following, appreciate recommendations - F/u MRI Brain  - Spot EEG unremarkable - AEDs per Neuro  (Versed  PRN) - Propofol  gtt for burst suppression - Seizure precautions - Neuroprotective measures: HOB > 30 degrees, normoglycemia, normothermia, electrolytes WNL  Acute encephalopathy in the setting of ?sepsis, SBO, dementia History of dementia Lactic acidosis - Admit to ICU for close monitoring - Goal MAP > 65 - Fluid resuscitation as tolerated - Trend WBC, fever curve, LA - F/u Cx data - Continue broad-spectrum antibiotics (Vanc/Zosyn )  Acute hypoxemic respiratory failure - Continue full vent support (4-8cc/kg IBW) - Wean FiO2 for O2 sat > 90% - Daily WUA/SBT once appropriate from a neurologic standpoint - VAP bundle - Pulmonary hygiene - PAD protocol for sedation: Propofol  and Fentanyl  for goal RASS 0 to -1 - Follow CXR, ABG  SBO with possible transition point in pelvis - Appreciate CCS consult and recommendations - NGT to ILWS - Strict NPO for now - Broad-spectrum antibiotics as above  HTN on home amlodipine , hydralazine , hydrochlorothiazide, lisinopril R ICA stenosis HLD PVD - Cardiac monitoring - Optimize electrolytes for K > 4, Mg > 2 - F/u Echo - ASA/statin resumption as appropriate - Resume home antihypertensives as appropriate  T2DM - Insulin  gtt with EndoTool - Goal CBG 140-180  Colon CA s/p hemicolectomy CT Chest/A/P demonstrated multiple loops of SB with suspected transition point mid-pelvis, vague pancreatic head density.  - Monitoring/surveillance per protocol  Best Practice: (right click and Reselect all SmartList Selections daily)   Diet/type: NPO DVT prophylaxis: SCDs, SQH GI prophylaxis: PPI Lines: N/A Foley:  Yes, and it is still needed Code Status:  full code Last date of multidisciplinary goals of care discussion [Pending]  Labs:  CBC: Recent Labs  Lab 05/20/24 1830 05/20/24 1957  WBC  --  11.4*  NEUTROABS  --  10.5*  HGB 12.2*  12.9* 11.3*  HCT 36.0*  38.0* 34.8*  MCV  --  79.8*  PLT  --  320   Basic Metabolic  Panel: Recent Labs  Lab 05/20/24 1804 05/20/24 1830 05/20/24 2342  NA 143 138  139 145  K 3.9 4.7  4.6 3.8  CL 103 105 108  CO2 16*  --  19*  GLUCOSE 411* 391* 140*  BUN 108* 124* 104*  CREATININE 3.76* 3.90* 3.45*  CALCIUM  10.4*  --  9.6  MG 3.6*  --   --    GFR: Estimated Creatinine Clearance: 13.6 mL/min (A) (by C-G formula based on SCr of 3.45 mg/dL (H)). Recent Labs  Lab 05/20/24 1831 05/20/24 1957 05/20/24 2018 05/20/24 2232 05/21/24 0119  WBC  --  11.4*  --   --   --   LATICACIDVEN 7.8*  --  9.6* 7.9* 5.4*   Liver Function Tests: Recent Labs  Lab 05/20/24 1804  AST 24  ALT 11  ALKPHOS 82  BILITOT 0.5  PROT 7.1  ALBUMIN 3.0*   No results for input(s): LIPASE, AMYLASE in the last 168 hours. No results for input(s): AMMONIA in the last 168 hours.  ABG:    Component Value Date/Time   HCO3 20.3 05/20/2024 1830   TCO2 21 (L) 05/20/2024 1830   TCO2 21 (L) 05/20/2024 1830   ACIDBASEDEF 3.0 (H) 05/20/2024 1830   O2SAT 94 05/20/2024 1830    Coagulation Profile: Recent Labs  Lab 05/20/24 1804  INR  1.2   Cardiac Enzymes: No results for input(s): CKTOTAL, CKMB, CKMBINDEX, TROPONINI in the last 168 hours.  HbA1C: Hgb A1c MFr Bld  Date/Time Value Ref Range Status  10/20/2023 01:25 AM 8.2 (H) 4.8 - 5.6 % Final    Comment:    (NOTE)         Prediabetes: 5.7 - 6.4         Diabetes: >6.4         Glycemic control for adults with diabetes: <7.0   05/13/2006 08:11 AM 6.8 (H) 4.6 - 6.1 % Final    Comment:       The ADA recommends the following therapeutic goals for glycemic   control related to Hgb A1C measurement:   Goal of Therapy:   < 7.0% Hgb A1C   Action Suggested:  > 8.0% Hgb A1C   Ref:  Diabetes Care, 22, Suppl. 1, 1999   CBG: Recent Labs  Lab 05/20/24 2159 05/20/24 2259 05/21/24 0003 05/21/24 0105 05/21/24 0211  GLUCAP 262* 307* 130* 101* 112*   Review of Systems:   Patient is encephalopathic and/or intubated; therefore,  history has been obtained from chart review.   Past Medical History:  He,  has a past medical history of Carotid artery stenosis, CKD (chronic kidney disease), stage IV (HCC), Colon cancer (HCC) (10/12/2004), Dementia (HCC), Diabetes mellitus, HLD (hyperlipidemia), HTN (hypertension), and Peripheral vascular disease (HCC).   Surgical History:   Past Surgical History:  Procedure Laterality Date   hemocolectomy  2005   Social History:   reports that he has never smoked. He has never used smokeless tobacco. He reports that he does not drink alcohol and does not use drugs.   Family History:  His family history includes Cancer in his mother and sister; Diabetes in his father.   Allergies: No Known Allergies   Home Medications: Prior to Admission medications   Medication Sig Start Date End Date Taking? Authorizing Provider  amLODipine  (NORVASC ) 10 MG tablet Take 10 mg by mouth daily. 03/05/18   [provider]  aspirin  (ASPIRIN  81) 81 MG chewable tablet 1 tablet    [provider]  calcitRIOL (ROCALTROL) 0.25 MCG capsule Take 0.25 mcg by mouth daily. 11/02/23   [provider]  ferrous sulfate  325 (65 FE) MG tablet Take 1 tablet (325 mg total) by mouth daily with breakfast. 10/26/23   Vann, Jessica U, DO  GVOKE HYPOPEN 2-PACK 1 MG/0.2ML SOAJ Inject 1 mg into the skin once as needed (for hypoglycemia). 02/08/22   [provider]  hydrALAZINE  (APRESOLINE ) 50 MG tablet Take 50 mg by mouth 3 (three) times daily. 12/03/23   [provider]  hydrochlorothiazide (HYDRODIURIL) 25 MG tablet Take 25 mg by mouth daily. 11/04/23   [provider]  insulin  lispro (HUMALOG KWIKPEN) 100 UNIT/ML KwikPen Inject 4 Units into the skin 3 (three) times daily after meals.    [provider]  lisinopril (PRINIVIL,ZESTRIL) 40 MG tablet Take 40 mg by mouth daily.    [provider]  rosuvastatin  (CRESTOR ) 10 MG tablet Take 10 mg by mouth daily.     [provider]  sodium bicarbonate  650 MG tablet Take 650 mg by mouth daily. 07/16/15   [provider]  tamsulosin  (FLOMAX ) 0.4 MG CAPS capsule Take 1 capsule (0.4 mg total) by mouth daily after breakfast. 01/01/24   Raenelle Coria, MD  TRULICITY  1.5 MG/0.5ML SOAJ Inject into the skin. 12/04/23   [provider]    Critical care  time:   The patient is critically ill with multiple organ system failure and requires high complexity decision making for assessment and support, frequent evaluation and titration of therapies, advanced monitoring, review of radiographic studies and interpretation of complex data.   Critical Care Time devoted to patient care services, exclusive of separately billable procedures, described in this note is 36 minutes.  Corean CHRISTELLA Cadarius Nevares, PA-C [facility] 05/21/24 3:12 AM  Please see [email or web address] for pager details.  From 7A-7P if no response, please call [phone] After hours, please call ELink [phone]

## 2024-05-21 NOTE — Progress Notes (Signed)
 Ventilator patient transported from 3M08 to MRI and back without any complications.

## 2024-05-21 NOTE — Progress Notes (Signed)
 SLP Cancellation Note  Patient Details Name: HIEU HERMS MRN: [id number] DOB: 01-24-1933   Cancelled treatment:       Reason Eval/Treat Not Completed: Patient not medically ready (intubated). SLP will continue following.    Damien Blumenthal, M.A., CCC-SLP Speech Language Pathology, Acute Rehabilitation Services  Secure Chat preferred [phone]  05/21/2024, 3:21 PM

## 2024-05-21 NOTE — Progress Notes (Addendum)
 NAME:  Jonathan Baird, MRN:  [id number], DOB:  [birth year], LOS: 0 ADMISSION DATE:  05/20/2024 CONSULTATION DATE:  05/21/2024 REFERRING MD:  Keturah - TRH, CHIEF COMPLAINT:  Acute hypoxemic respiratory failure in the setting of seizure, need for airway protection   History of Present Illness:  89 year old man who presented to [facility] ED 7/10 for respiratory distress. PMHx significant for HTN, HLD, T2DM, stage IV CKD, PVD, R ICA stenosis, colon CA s/p hemicolectomy, dementia. Recent brief admission 2/17-2/18 for progressive weakness and urinary frequency. Recently treated presumed UTI.  Patient initially presented to ED from home for respiratory distress. Reportedly in his USOH 7/8PM, but developed SOB and wet cough 7/9. On ED arrival, patient was afebrile with temp 99.63F, HR 130, BP 150/90, SpO2 64% on RA, improved to 89% with nebulizer and 4LNC. DuoNebs, Solumedrol and Mg administered. GCS 10. CBG 468. Labs were notable for WBC 11.4, Hgb 11.3, Plt 320. Na 143, K 3.9, CO2 16, BUN/Cr 108/3.76 (baseline Cr 2.8-2.9). LFTs WNL. INR 1.2. Trop 37 > 45. BNP 22.1. RVP negative. UA > 500 glucose, otherwise unremarkable. Serum Osm 360. B-hydroxybutyrate 0.25. LA 7.8 > 9.6 > 7.9. CXR with scattered airspace disease (perihilar) c/f PNA; CT Head NAICA. CT Chest/A/P demonstrated multiple loops of SB with suspected transition point mid-pelvis, scattered bilateral airspace disease in the lungs c/w PNA, vague pancreatic head density. Insulin  gtt was started for hyperglycemia and broad-spectrum antibiotics initiated (Vanc/Zosyn ).   While awaiting bed placement in ED, patient had a witnessed seizure lasting about 2 minutes with L eye deviation. Neuro was consulted. Spot EEG demonstrated diffuse slowing without electrographic seizures. Patient remained particularly somnolent and was poorly protecting his airway with gurgling respirations and decision was made to intubate.  PCCM consulted for ICU admission.  Pertinent Medical  History:   Past Medical History:  Diagnosis Date   Carotid artery stenosis    Right   CKD (chronic kidney disease), stage IV (HCC)    Colon cancer (HCC) 10/12/2004   stage III, s/p hemicolectomy   Dementia (HCC)    Diabetes mellitus    HLD (hyperlipidemia)    HTN (hypertension)    Peripheral vascular disease (HCC)    Significant Hospital Events: Including procedures, antibiotic start and stop dates in addition to other pertinent events   7/9 - Presented to ED with AMS, SOB. 7/10 - PCCM consulted for airway protection, intubated in ED.  Interim History / Subjective:  Still boarding in ED   Objective:  Blood pressure (!) 167/73, pulse 67, temperature (!) 96.6 F (35.9 C), temperature source Temporal, resp. rate 16, height 5' 10 (1.778 m), weight 65 kg, SpO2 100%.    Vent Mode: PRVC FiO2 (%):  [40 %-100 %] 40 % Set Rate:  [10 bmp-18 bmp] 16 bmp Vt Set:  [580 mL] 580 mL PEEP:  [5 cmH20] 5 cmH20 Plateau Pressure:  [16 cmH20] 16 cmH20   Intake/Output Summary (Last 24 hours) at 05/21/2024 0807 Last data filed at 05/21/2024 0449 Gross per 24 hour  Intake 4733.36 ml  Output 2400 ml  Net 2333.36 ml   Filed Weights   05/21/24 0229  Weight: 65 kg   Physical Examination: General: Critically ill elderly M intubated sedated  HEENT NCAT ETT secure Clear oral secretions  Neuro: Overbreathing vent. Pupils are reactive. + cough gag  CV rr cap refill < 3 sec  PULM: Mechanically ventilated  GI: soft nondistended  Extremities: No obvious acute deformity  Skin:  c/d  Resolved Hospital Problem  List:    Assessment & Plan:    Acute encephalopathy superimposed on underlying dementia  Seizure  -spot eeg  neg. CT H without acute abnormality  P -neuro consult is pending; appreciate recs  -to go for MRI brain  -Ive started pt on BID keppra .  -On prop continuously w PRN BZD available  -sz precautions  Acute hypoxic resp failure  -VAP pulm hygiene -WUA/SBT as able   Possible  sepsis -UA not c/w UTI.  P -broad abx, narrow as able -follow micro data   HTN R ICA stenosis HLD PVD P -home meds on hold for now -Bps have started to climb; PRN hydral ordered. -will wait for MRI before starting home ASA   Possible SBO  Hyperbilirubinemia Hx colon ca s/p hemicolectomy P -NGT to LIWS, NPO for now  -CCS has seen, usual SBO protocol   DM2 w hyperglycemia P -transition off insulin  gtt      Best Practice: (right click and Reselect all SmartList Selections daily)   Diet/type: NPO DVT prophylaxis: SCDs, SQH GI prophylaxis: PPI Lines: N/A Foley:  Yes, and it is still needed Code Status:  full code Last date of multidisciplinary goals of care discussion [Pending]  Labs:  CBC: Recent Labs  Lab 05/20/24 1830 05/20/24 1957 05/21/24 0317 05/21/24 0444  WBC  --  11.4*  --  15.8*  NEUTROABS  --  10.5*  --   --   HGB 12.2*  12.9* 11.3* 8.8* 8.2*  HCT 36.0*  38.0* 34.8* 26.0* 24.8*  MCV  --  79.8*  --  80.0  PLT  --  320  --  198   Basic Metabolic Panel: Recent Labs  Lab 05/20/24 1804 05/20/24 1830 05/20/24 2342 05/21/24 0317 05/21/24 0444  NA 143 138  139 145 142 135  K 3.9 4.7  4.6 3.8 3.8 3.7  CL 103 105 108  --  104  CO2 16*  --  19*  --  19*  GLUCOSE 411* 391* 140*  --  182*  BUN 108* 124* 104*  --  87*  CREATININE 3.76* 3.90* 3.45*  --  2.62*  CALCIUM  10.4*  --  9.6  --  8.0*  MG 3.6*  --   --   --  2.2  PHOS  --   --   --   --  3.6   GFR: Estimated Creatinine Clearance: 17.9 mL/min (A) (by C-G formula based on SCr of 2.62 mg/dL (H)). Recent Labs  Lab 05/20/24 1957 05/20/24 2018 05/20/24 2232 05/21/24 0119 05/21/24 0444  WBC 11.4*  --   --   --  15.8*  LATICACIDVEN  --  9.6* 7.9* 5.4* 3.1*   Liver Function Tests: Recent Labs  Lab 05/20/24 1804 05/21/24 0444  AST 24 14*  ALT 11 8  ALKPHOS 82 47  BILITOT 0.5 2.8*  PROT 7.1 4.5*  ALBUMIN 3.0* 1.9*   No results for input(s): LIPASE, AMYLASE in the last 168  hours. No results for input(s): AMMONIA in the last 168 hours.  ABG:    Component Value Date/Time   PHART 7.406 05/21/2024 0317   PCO2ART 35.2 05/21/2024 0317   PO2ART 431 (H) 05/21/2024 0317   HCO3 22.0 05/21/2024 0317   TCO2 23 05/21/2024 0317   ACIDBASEDEF 2.0 05/21/2024 0317   O2SAT 100 05/21/2024 0317    Coagulation Profile: Recent Labs  Lab 05/20/24 1804  INR 1.2   Cardiac Enzymes: No results for input(s): CKTOTAL, CKMB, CKMBINDEX, TROPONINI in the last  168 hours.  HbA1C: Hgb A1c MFr Bld  Date/Time Value Ref Range Status  10/20/2023 01:25 AM 8.2 (H) 4.8 - 5.6 % Final    Comment:    (NOTE)         Prediabetes: 5.7 - 6.4         Diabetes: >6.4         Glycemic control for adults with diabetes: <7.0   05/13/2006 08:11 AM 6.8 (H) 4.6 - 6.1 % Final    Comment:       The ADA recommends the following therapeutic goals for glycemic   control related to Hgb A1C measurement:   Goal of Therapy:   < 7.0% Hgb A1C   Action Suggested:  > 8.0% Hgb A1C   Ref:  Diabetes Care, 22, Suppl. 1, 1999   CBG: Recent Labs  Lab 05/21/24 0105 05/21/24 0211 05/21/24 0315 05/21/24 0444 05/21/24 0801  GLUCAP 101* 112* 146* 176* 263*    CRITICAL CARE Performed by: Ronnald FORBES Gave   Total critical care time: 38 minutes  Critical care time was exclusive of separately billable procedures and treating other patients. Critical care was necessary to treat or prevent imminent or life-threatening deterioration.  Critical care was time spent personally by me on the following activities: development of treatment plan with patient and/or surrogate as well as nursing, discussions with consultants, evaluation of patient's response to treatment, examination of patient, obtaining history from patient or surrogate, ordering and performing treatments and interventions, ordering and review of laboratory studies, ordering and review of radiographic studies, pulse oximetry and re-evaluation of  patient's condition.'  Ronnald Gave MSN, AGACNP-BC [facility] Amion for pager  05/21/2024, 8:07 AM

## 2024-05-22 ENCOUNTER — Inpatient Hospital Stay (HOSPITAL_COMMUNITY)

## 2024-05-22 ENCOUNTER — Other Ambulatory Visit: Payer: Self-pay

## 2024-05-22 DIAGNOSIS — A419 Sepsis, unspecified organism: Secondary | ICD-10-CM | POA: Diagnosis not present

## 2024-05-22 DIAGNOSIS — E1151 Type 2 diabetes mellitus with diabetic peripheral angiopathy without gangrene: Secondary | ICD-10-CM | POA: Diagnosis not present

## 2024-05-22 DIAGNOSIS — N189 Chronic kidney disease, unspecified: Secondary | ICD-10-CM

## 2024-05-22 DIAGNOSIS — R6521 Severe sepsis with septic shock: Secondary | ICD-10-CM | POA: Diagnosis not present

## 2024-05-22 DIAGNOSIS — N179 Acute kidney failure, unspecified: Secondary | ICD-10-CM

## 2024-05-22 DIAGNOSIS — J9601 Acute respiratory failure with hypoxia: Secondary | ICD-10-CM | POA: Diagnosis not present

## 2024-05-22 DIAGNOSIS — I63512 Cerebral infarction due to unspecified occlusion or stenosis of left middle cerebral artery: Secondary | ICD-10-CM | POA: Diagnosis not present

## 2024-05-22 DIAGNOSIS — I639 Cerebral infarction, unspecified: Secondary | ICD-10-CM

## 2024-05-22 DIAGNOSIS — K56609 Unspecified intestinal obstruction, unspecified as to partial versus complete obstruction: Secondary | ICD-10-CM | POA: Diagnosis not present

## 2024-05-22 DIAGNOSIS — G9341 Metabolic encephalopathy: Secondary | ICD-10-CM | POA: Diagnosis not present

## 2024-05-22 DIAGNOSIS — G9340 Encephalopathy, unspecified: Secondary | ICD-10-CM

## 2024-05-22 DIAGNOSIS — I6389 Other cerebral infarction: Secondary | ICD-10-CM | POA: Diagnosis not present

## 2024-05-22 DIAGNOSIS — J9690 Respiratory failure, unspecified, unspecified whether with hypoxia or hypercapnia: Secondary | ICD-10-CM

## 2024-05-22 DIAGNOSIS — R569 Unspecified convulsions: Secondary | ICD-10-CM | POA: Diagnosis not present

## 2024-05-22 LAB — COMPREHENSIVE METABOLIC PANEL WITH GFR
ALT: 10 U/L (ref 0–44)
AST: 14 U/L — ABNORMAL LOW (ref 15–41)
Albumin: 2.1 g/dL — ABNORMAL LOW (ref 3.5–5.0)
Alkaline Phosphatase: 57 U/L (ref 38–126)
Anion gap: 11 (ref 5–15)
BUN: 101 mg/dL — ABNORMAL HIGH (ref 8–23)
CO2: 23 mmol/L (ref 22–32)
Calcium: 9.3 mg/dL (ref 8.9–10.3)
Chloride: 112 mmol/L — ABNORMAL HIGH (ref 98–111)
Creatinine, Ser: 3.39 mg/dL — ABNORMAL HIGH (ref 0.61–1.24)
GFR, Estimated: 17 mL/min — ABNORMAL LOW (ref >60)
Glucose, Bld: 103 mg/dL — ABNORMAL HIGH (ref 70–99)
Potassium: 4 mmol/L (ref 3.5–5.1)
Sodium: 146 mmol/L — ABNORMAL HIGH (ref 135–145)
Total Bilirubin: 0.6 mg/dL (ref 0.0–1.2)
Total Protein: 5.3 g/dL — ABNORMAL LOW (ref 6.5–8.1)

## 2024-05-22 LAB — CBC
HCT: 25.1 % — ABNORMAL LOW (ref 39.0–52.0)
Hemoglobin: 8.6 g/dL — ABNORMAL LOW (ref 13.0–17.0)
MCH: 26.2 pg (ref 26.0–34.0)
MCHC: 34.3 g/dL (ref 30.0–36.0)
MCV: 76.5 fL — ABNORMAL LOW (ref 80.0–100.0)
Platelets: 219 K/uL (ref 150–400)
RBC: 3.28 MIL/uL — ABNORMAL LOW (ref 4.22–5.81)
RDW: 15.2 % (ref 11.5–15.5)
WBC: 10.6 K/uL — ABNORMAL HIGH (ref 4.0–10.5)
nRBC: 0.2 % (ref 0.0–0.2)

## 2024-05-22 LAB — ECHOCARDIOGRAM COMPLETE
AR max vel: 3.58 cm2
AV Area VTI: 4 cm2
AV Area mean vel: 3.06 cm2
AV Mean grad: 2 mmHg
AV Peak grad: 3.3 mmHg
Ao pk vel: 0.91 m/s
Area-P 1/2: 2.46 cm2
Height: 70 in
S' Lateral: 2.6 cm
Weight: 2186.96 [oz_av]

## 2024-05-22 LAB — LIPID PANEL
Cholesterol: 78 mg/dL (ref 0–200)
HDL: 36 mg/dL — ABNORMAL LOW (ref >40)
LDL Cholesterol: 26 mg/dL (ref 0–99)
Total CHOL/HDL Ratio: 2.2 ratio
Triglycerides: 78 mg/dL (ref <150)
VLDL: 16 mg/dL (ref 0–40)

## 2024-05-22 LAB — GLUCOSE, CAPILLARY
Glucose-Capillary: 98 mg/dL (ref 70–99)
Glucose-Capillary: 105 mg/dL — ABNORMAL HIGH (ref 70–99)
Glucose-Capillary: 102 mg/dL — ABNORMAL HIGH (ref 70–99)
Glucose-Capillary: 126 mg/dL — ABNORMAL HIGH (ref 70–99)
Glucose-Capillary: 96 mg/dL (ref 70–99)
Glucose-Capillary: 121 mg/dL — ABNORMAL HIGH (ref 70–99)

## 2024-05-22 LAB — LACTIC ACID, PLASMA: Lactic Acid, Venous: 2.2 mmol/L (ref 0.5–1.9)

## 2024-05-22 LAB — TRIGLYCERIDES: Triglycerides: 79 mg/dL (ref <150)

## 2024-05-22 LAB — VANCOMYCIN, RANDOM: Vancomycin Rm: 14 ug/mL

## 2024-05-22 MED ORDER — VITAL 1.5 CAL PO LIQD
1000.0000 mL | ORAL | Status: DC
  Administered 2024-05-22: 1000 mL
  Filled 2024-05-22: qty 1000

## 2024-05-22 MED ORDER — SODIUM CHLORIDE 0.9 % IV SOLN: 250.0000 mL | INTRAVENOUS | Status: AC

## 2024-05-22 MED ORDER — THIAMINE MONONITRATE 100 MG PO TABS
100.0000 mg | ORAL_TABLET | Freq: Every day | ORAL | Status: AC
  Administered 2024-05-22 – 2024-05-26 (×5): 100 mg
  Filled 2024-05-22 (×5): qty 1

## 2024-05-22 MED ORDER — NOREPINEPHRINE 4 MG/250ML-% IV SOLN: 0.0000 ug/min | INTRAVENOUS | Status: DC

## 2024-05-22 MED ORDER — VITAL HP 1.0 CAL PO LIQD
1000.0000 mL | ORAL | Status: DC
  Administered 2024-05-22: 1000 mL

## 2024-05-22 NOTE — Progress Notes (Signed)
 OT Cancellation Note  Patient Details Name: Jonathan Baird MRN: [id number] DOB: 06/19/1933   Cancelled Treatment:    Reason Eval/Treat Not Completed: Patient not medically ready Intubated and sedated. Will hold OT eval and check back at a later date.  Mliss Fish 05/22/2024, 12:43 PM

## 2024-05-22 NOTE — Progress Notes (Signed)
 VASCULAR LAB    Limited carotid duplex has been performed.  See CV proc for preliminary results.   Marijose Curington, RVT 05/22/2024, 1:29 PM

## 2024-05-22 NOTE — Progress Notes (Signed)
 Progress Note     Subjective: Pt sedated on the vent, no family at bedside.   Objective: Vital signs in last 24 hours: Temp:  [94.2 F (34.6 C)-99 F (37.2 C)] 96.4 F (35.8 C) (07/11 1030) Pulse Rate:  [49-211] 78 (07/11 1030) Resp:  [0-23] 22 (07/11 1030) BP: (78-184)/(37-94) 146/58 (07/11 1000) SpO2:  [65 %-100 %] 100 % (07/11 1030) FiO2 (%):  [40 %] 40 % (07/11 0727) Weight:  [62 kg] 62 kg (07/11 0400) Last BM Date :  (PTA)  Intake/Output from previous day: 07/10 0701 - 07/11 0700 In: 3023.9 [I.V.:2193.3; NG/GT:215; IV Piggyback:615.7] Out: 2200 [Urine:1800] Intake/Output this shift: Total I/O In: 100.6 [I.V.:6.7; IV Piggyback:93.9] Out: -   PE: General: intubated, sedated Heart: regular, rate, and rhythm.   Lungs: vent Abd: soft, ND, prior laparotomy scar, NGT clamped, No response to palpation of abdomen   Lab Results:  Recent Labs    05/21/24 0444 05/22/24 0555  WBC 15.8* 10.6*  HGB 8.2* 8.6*  HCT 24.8* 25.1*  PLT 198 219   BMET Recent Labs    05/21/24 0444 05/22/24 0555  NA 135 146*  K 3.7 4.0  CL 104 112*  CO2 19* 23  GLUCOSE 182* 103*  BUN 87* 101*  CREATININE 2.62* 3.39*  CALCIUM  8.0* 9.3   PT/INR Recent Labs    05/20/24 1804  LABPROT 16.3*  INR 1.2   CMP     Component Value Date/Time   NA 146 (H) 05/22/2024 0555   NA 139 08/25/2015 1329   K 4.0 05/22/2024 0555   K 4.7 08/25/2015 1329   CL 112 (H) 05/22/2024 0555   CL 99 05/12/2010 1332   CO2 23 05/22/2024 0555   CO2 25 08/25/2015 1329   GLUCOSE 103 (H) 05/22/2024 0555   GLUCOSE 218 (H) 08/25/2015 1329   GLUCOSE 94 05/12/2010 1332   BUN 101 (H) 05/22/2024 0555   BUN 33.3 (H) 08/25/2015 1329   CREATININE 3.39 (H) 05/22/2024 0555   CREATININE 2.5 (H) 08/25/2015 1329   CALCIUM  9.3 05/22/2024 0555   CALCIUM  9.4 08/25/2015 1329   PROT 5.3 (L) 05/22/2024 0555   PROT 7.9 08/25/2015 1329   ALBUMIN 2.1 (L) 05/22/2024 0555   ALBUMIN 4.0 08/25/2015 1329   AST 14 (L)  05/22/2024 0555   AST 12 08/25/2015 1329   ALT 10 05/22/2024 0555   ALT 15 08/25/2015 1329   ALKPHOS 57 05/22/2024 0555   ALKPHOS 98 08/25/2015 1329   BILITOT 0.6 05/22/2024 0555   BILITOT 0.39 08/25/2015 1329   GFRNONAA 17 (L) 05/22/2024 0555   Lipase  No results found for: LIPASE     Studies/Results: ECHOCARDIOGRAM COMPLETE Result Date: 05/22/2024    ECHOCARDIOGRAM REPORT   Patient Name:   Jonathan Baird Date of Exam: 05/22/2024 Medical Rec #:  [id number]       Height:       70.0 in Accession #:    [id number]      Weight:       136.7 lb Date of Birth:  [birth year]      BSA:          1.776 m Patient Age:    89 years        BP:           146/58 mmHg Patient Gender: M               HR:           78 bpm.  Exam Location:  Inpatient Procedure: 2D Echo, Cardiac Doppler and Color Doppler (Both Spectral and Color            Flow Doppler were utilized during procedure). Indications:    Stroke  History:        Patient has prior history of Echocardiogram examinations. Risk                 Factors:Hypertension and Diabetes.  Sonographer:    Christiana Mbomeh Referring Phys: [id number] KHABIB DGAYLI IMPRESSIONS  1. Left ventricular ejection fraction, by estimation, is 60 to 65%. The left ventricle has normal function. The left ventricle has no regional wall motion abnormalities. There is moderate asymmetric left ventricular hypertrophy of the basal-septal segment. Left ventricular diastolic parameters are consistent with Grade I diastolic dysfunction (impaired relaxation).  2. Right ventricular systolic function is normal. The right ventricular size is normal. There is normal pulmonary artery systolic pressure. The estimated right ventricular systolic pressure is 28.6 mmHg.  3. The mitral valve is normal in structure. Trivial mitral valve regurgitation. No evidence of mitral stenosis.  4. The aortic valve was not well visualized. Aortic valve regurgitation is not visualized. Aortic valve sclerosis is present,  with no evidence of aortic valve stenosis.  5. The inferior vena cava is dilated in size with >50% respiratory variability, suggesting right atrial pressure of 8 mmHg. FINDINGS  Left Ventricle: Left ventricular ejection fraction, by estimation, is 60 to 65%. The left ventricle has normal function. The left ventricle has no regional wall motion abnormalities. The left ventricular internal cavity size was normal in size. There is  moderate asymmetric left ventricular hypertrophy of the basal-septal segment. Left ventricular diastolic parameters are consistent with Grade I diastolic dysfunction (impaired relaxation). Right Ventricle: The right ventricular size is normal. Right vetricular wall thickness was not well visualized. Right ventricular systolic function is normal. There is normal pulmonary artery systolic pressure. The tricuspid regurgitant velocity is 2.27 m/s, and with an assumed right atrial pressure of 8 mmHg, the estimated right ventricular systolic pressure is 28.6 mmHg. Left Atrium: Left atrial size was normal in size. Right Atrium: Right atrial size was normal in size. Pericardium: There is no evidence of pericardial effusion. Mitral Valve: The mitral valve is normal in structure. Trivial mitral valve regurgitation. No evidence of mitral valve stenosis. Tricuspid Valve: The tricuspid valve is normal in structure. Tricuspid valve regurgitation is trivial. Aortic Valve: The aortic valve was not well visualized. Aortic valve regurgitation is not visualized. Aortic valve sclerosis is present, with no evidence of aortic valve stenosis. Aortic valve mean gradient measures 2.0 mmHg. Aortic valve peak gradient measures 3.3 mmHg. Aortic valve area, by VTI measures 4.00 cm. Pulmonic Valve: The pulmonic valve was not well visualized. Pulmonic valve regurgitation is not visualized. Aorta: The aortic root is normal in size and structure. Venous: The inferior vena cava is dilated in size with greater than 50%  respiratory variability, suggesting right atrial pressure of 8 mmHg. IAS/Shunts: The interatrial septum was not well visualized.  LEFT VENTRICLE PLAX 2D LVIDd:         4.00 cm   Diastology LVIDs:         2.60 cm   LV e' medial:    3.59 cm/s LV PW:         1.10 cm   LV E/e' medial:  13.5 LV IVS:        1.70 cm   LV e' lateral:   3.70 cm/s LVOT diam:  2.10 cm   LV E/e' lateral: 13.1 LV SV:         75 LV SV Index:   42 LVOT Area:     3.46 cm  RIGHT VENTRICLE            IVC RV S prime:     8.59 cm/s  IVC diam: 2.40 cm TAPSE (M-mode): 1.8 cm LEFT ATRIUM             Index LA Vol (A2C):   22.1 ml 12.45 ml/m LA Vol (A4C):   15.0 ml 8.45 ml/m LA Biplane Vol: 19.2 ml 10.81 ml/m  AORTIC VALVE AV Area (Vmax):    3.58 cm AV Area (Vmean):   3.06 cm AV Area (VTI):     4.00 cm AV Vmax:           91.40 cm/s AV Vmean:          67.300 cm/s AV VTI:            0.188 m AV Peak Grad:      3.3 mmHg AV Mean Grad:      2.0 mmHg LVOT Vmax:         94.60 cm/s LVOT Vmean:        59.500 cm/s LVOT VTI:          0.217 m LVOT/AV VTI ratio: 1.15  AORTA Ao Root diam: 3.60 cm MITRAL VALVE               TRICUSPID VALVE MV Area (PHT): 2.46 cm    TV Peak grad:   20.6 mmHg MV Decel Time: 308 msec    TV Vmax:        2.27 m/s MV E velocity: 48.50 cm/s  TR Peak grad:   20.6 mmHg MV A velocity: 75.70 cm/s  TR Vmax:        227.00 cm/s MV E/A ratio:  0.64                            SHUNTS                            Systemic VTI:  0.22 m                            Systemic Diam: 2.10 cm Lonni Nanas MD Electronically signed by Lonni Nanas MD Signature Date/Time: 05/22/2024/11:20:38 AM    Final    MR ANGIO HEAD WO CONTRAST Result Date: 05/22/2024 CLINICAL DATA:  Follow-up examination for stroke. EXAM: MRA HEAD WITHOUT CONTRAST TECHNIQUE: Angiographic images of the Circle of Willis were acquired using MRA technique without intravenous contrast. COMPARISON:  Prior MRI from 05/21/2024 FINDINGS: Anterior circulation: Both ICAs are patent  through the siphons without significant stenosis or other abnormality. A1 segments patent bilaterally. Normal anterior communicating artery complex. Anterior cerebral arteries patent without significant stenosis. No M1 stenosis or occlusion. Distal MCA branches perfused and symmetric. Posterior circulation: Visualized V4 segments patent without stenosis. Right PICA patent at its origin. Left PICA not seen. Basilar patent without significant stenosis. Superior cerebral arteries patent bilaterally. Right PCA supplied via a robust right PCOM and hypoplastic right P1 segment. Fetal type origin of the left PCA. Both PCAs patent to their distal aspects without significant stenosis. Anatomic variants: None significant. Other: No intracranial aneurysm or other vascular malformation. Ventriculomegaly with a few  scattered chronic infarcts, stable. IMPRESSION: Negative intracranial MRA. No large vessel occlusion, hemodynamically significant stenosis, or other acute vascular abnormality. No aneurysm. Electronically Signed   By: Morene Hoard M.D.   On: 05/22/2024 05:46   DG Abd Portable 1V-Small Bowel Obstruction Protocol-initial, 8 hr delay Result Date: 05/21/2024 CLINICAL DATA:  Small bowel obstruction. EXAM: PORTABLE ABDOMEN - 1 VIEW COMPARISON:  Radiograph dated 05/20/2024. FINDINGS: Enteric tube with tip in the proximal stomach. Injected contrast opacifies the stomach. No bowel dilatation. No free air. No acute osseous pathology. IMPRESSION: Enteric tube with tip in the proximal stomach. Electronically Signed   By: Vanetta Chou M.D.   On: 05/21/2024 17:45   MR Brain W and Wo Contrast Result Date: 05/21/2024 CLINICAL DATA:  Provided history: Neuro deficit, acute, stroke suspected. EXAM: MRI HEAD WITHOUT AND WITH CONTRAST TECHNIQUE: Multiplanar, multiecho pulse sequences of the brain and surrounding structures were obtained without and with intravenous contrast. CONTRAST:  7mL GADAVIST  GADOBUTROL  1 MMOL/ML  IV SOLN COMPARISON:  Head CT 05/20/2024.  Brain MRI 12/31/2023. FINDINGS: Brain: Ventriculomegaly is unchanged from the prior brain MRI of 12/31/2023. This is at least partly due to cerebral atrophy. However, there is also acute callosal angle and crowding of the sulci at the level of the high frontoparietal lobes, and these findings can be seen in the setting of normal pressure hydrocephalus (NPH). 2 mm acute infarct within the left frontal lobe centrum semiovale (series 5, image 87). Multifocal T2 FLAIR hyperintense signal abnormality within the cerebral white matter and pons, nonspecific but compatible with moderate-to-advanced chronic small vessel ischemic disease. Unchanged small chronic infarcts within the thalami, pons and right cerebellar hemisphere. Chronic microhemorrhages scattered within the supratentorial brain, similar to the prior MRI. No evidence of an intracranial mass No extra-axial fluid collection. No midline shift. No pathologic intracranial enhancement identified. Vascular: Maintained flow voids within the proximal large arterial vessels. Skull and upper cervical spine: No focal worrisome marrow lesion. Sinuses/Orbits: No mass or acute finding within the imaged orbits. Prior bilateral ocular lens replacement. No significant paranasal sinus disease. Other: Small-volume fluid within bilateral mastoid air cells. Impression #1 will be called to the ordering clinician or representative by the Radiologist Assistant, and communication documented in the PACS or [employer]. IMPRESSION: 1. 2 mm acute infarct within the left frontal lobe centrum semiovale. 2. Background parenchymal atrophy, chronic small vessel ischemic disease and chronic infarcts, as described. 3. Ventriculomegaly is unchanged from the prior MRI of 12/31/2023. While this is at least partly due to cerebral atrophy, a component of normal pressure hydrocephalus is possible (in the appropriate clinical setting). 4. Small-volume fluid  within bilateral mastoid air cells. Electronically Signed   By: Rockey Childs D.O.   On: 05/21/2024 13:32   EEG adult Result Date: 05/21/2024 Shelton Arlin KIDD, MD     05/21/2024  8:47 AM Patient Name: RAYMIE GIAMMARCO MRN: [id number] Epilepsy Attending: Arlin KIDD Shelton Referring Physician/Provider: Merrianne Locus, MD Date: 05/21/2024 Duration: 24.55 mins Patient history: 89yo M with ams. EEG to evaluate for seizure Level of alertness: comatose AEDs during EEG study: LEV, Propofol  Technical aspects: This EEG study was done with scalp electrodes positioned according to the 10-20 International system of electrode placement. Electrical activity was reviewed with band pass filter of 1-70Hz , sensitivity of 7 uV/mm, display speed of 29mm/sec with a 60Hz  notched filter applied as appropriate. EEG data were recorded continuously and digitally stored.  Video monitoring was available and reviewed as appropriate. Description: EEG showed burst attenuation  pattern with bursts of 5 to 6 Hz theta slowing lasting 2 to 3 seconds alternating with 1 to 2 seconds of generalized EEG attenuation.  Hyperventilation and photic stimulation were not performed.   ABNORMALITY - Burst attenuation, generalized IMPRESSION: This study is suggestive of profound diffuse encephalopathy.  No seizures or epileptiform discharges were seen throughout the recording. Arlin MALVA Krebs   DG Chest Portable 1 View Result Date: 05/21/2024 CLINICAL DATA:  Intubation confirmation EXAM: PORTABLE CHEST 1 VIEW COMPARISON:  05/20/2024 FINDINGS: Endotracheal tube is been placed with tip measuring 6.2 cm above the carina. An enteric tube was placed. Tip projects over the expected location of the EG junction with proximal side hole in the distal esophageal region. Advancement is suggested. Heart size and pulmonary vascularity are normal. Perihilar infiltrates similar to prior study. No pleural effusion or pneumothorax. Calcification of the aorta. IMPRESSION:  Endotracheal tube with tip above the carina. Enteric tube tip projects over the lower esophageal region and advancement is suggested. Persistent perihilar infiltrates. Electronically Signed   By: Elsie Gravely M.D.   On: 05/21/2024 02:42   DG Abd Portable 1V-Small Bowel Protocol-Position Verification Result Date: 05/20/2024 CLINICAL DATA:  NG tube placement confirmation EXAM: PORTABLE ABDOMEN - 1 VIEW COMPARISON:  05/20/2024 FINDINGS: Limited field of view for tube placement verification purposes. The enteric tube has been advanced with tip now projecting over the left upper quadrant consistent with location in the body of the stomach. Mild gaseous distention of the stomach. IMPRESSION: Enteric tube has been advanced with tip now projecting over the left upper quadrant consistent with location in the body of the stomach. Electronically Signed   By: Elsie Gravely M.D.   On: 05/20/2024 23:00   DG Abd Portable 1 View Result Date: 05/20/2024 CLINICAL DATA:  NG tube placement EXAM: PORTABLE ABDOMEN - 1 VIEW COMPARISON:  CT 05/20/2024 FINDINGS: Enteric tube tip folded back upon itself and directed cranially in the region of the distal esophagus. Heterogeneous airspace disease at the lung bases. Moderate air distension of the stomach IMPRESSION: Enteric tube tip folded back upon itself and directed cranially in the region of the distal esophagus. Recommend repositioning. Electronically Signed   By: Luke Bun M.D.   On: 05/20/2024 22:13   CT Head Wo Contrast Result Date: 05/20/2024 CLINICAL DATA:  Mental status changes, unknown cause. EXAM: CT HEAD WITHOUT CONTRAST TECHNIQUE: Contiguous axial images were obtained from the base of the skull through the vertex without intravenous contrast. RADIATION DOSE REDUCTION: This exam was performed according to the departmental dose-optimization program which includes automated exposure control, adjustment of the mA and/or kV according to patient size and/or use of  iterative reconstruction technique. COMPARISON:  Head CT 12/31/2023. FINDINGS: Brain: There is mild cerebellar volume loss, small chronic right superior cerebellar lacunar infarct. There is moderately advanced cerebral atrophy and atrophic ventriculomegaly with moderate to severe small vessel disease of cerebral white matter. Chronic dystrophic calcification again noted in the frontal falx, senescent mineralization in the basal ganglia. No acute cortical based infarct, hemorrhage, mass or mass effect is seen. There is no midline shift.  Basal cisterns are patent. Vascular: There are calcifications in the distal vertebral arteries and heavily in the siphons. No hyperdense central vessels. Skull: Negative for fractures or focal lesions. Sinuses/Orbits: No acute findings. Old lens replacements with otherwise negative orbits. Other: None. IMPRESSION: 1. No acute intracranial CT findings or interval changes. 2. Atrophy and small-vessel disease with atrophic ventriculomegaly. Stable exam. 3. Atherosclerosis. Electronically Signed  By: Francis Quam M.D.   On: 05/20/2024 21:49   CT CHEST ABDOMEN PELVIS WO CONTRAST Addendum Date: 05/20/2024 ADDENDUM REPORT: 05/20/2024 21:28 ADDENDUM: Critical Value/emergent results were called by telephone at the time of interpretation on 05/20/2024 at 9:27 pm to provider BERNARDINO FIREMAN , who verbally acknowledged these results. Electronically Signed   By: Leita Birmingham M.D.   On: 05/20/2024 21:28   Result Date: 05/20/2024 CLINICAL DATA:  Sepsis. EXAM: CT CHEST, ABDOMEN AND PELVIS WITHOUT CONTRAST TECHNIQUE: Multidetector CT imaging of the chest, abdomen and pelvis was performed following the standard protocol without IV contrast. RADIATION DOSE REDUCTION: This exam was performed according to the departmental dose-optimization program which includes automated exposure control, adjustment of the mA and/or kV according to patient size and/or use of iterative reconstruction technique.  COMPARISON:  09/17/2007. FINDINGS: CT CHEST FINDINGS Cardiovascular: The heart is normal in size and there is a trace pericardial effusion. Three-vessel coronary artery calcifications are noted. There is atherosclerotic calcification of the aorta without evidence of aneurysm. The pulmonary trunk is normal in caliber. Mediastinum/Nodes: No mediastinal or axillary lymphadenopathy. The thyroid  gland and trachea are within normal limits. Esophagus is mildly distended with fluid. Lungs/Pleura: Scattered airspace opacities are noted in the right lung with consolidation in the right upper and lower lobes. There is consolidation in the left lower lobe with mild airspace disease in the left upper lobe. There is a 3 mm nodule in the left upper lobe, axial image 38. No effusion or pneumothorax is seen. Musculoskeletal: Gynecomastia is noted. Degenerative changes are present in the thoracic spine. No acute osseous abnormality. CT ABDOMEN PELVIS FINDINGS Hepatobiliary: No focal liver abnormality is seen. No gallstones, gallbladder wall thickening, or biliary dilatation. Pancreas: There is a vague hypodensity in the head of the pancreas measuring 1.8 x 1.2 cm. No pancreatic ductal dilatation or surrounding inflammatory changes. Spleen: Normal in size without focal abnormality. Adrenals/Urinary Tract: The adrenal glands are within normal limits. No renal calculus or hydronephrosis bilaterally. A Foley catheter is present in the urinary bladder. Stomach/Bowel: The stomach is distended with fluid. Multiple dilated loops of small bowel are present in the abdomen measuring up to 4.4 cm in diameter. A suspected transition point is seen in the mid pelvis, sagittal image 71, coronal image 74. There is a left inguinal hernia containing nonobstructed small bowel. No free air or pneumatosis is seen. A moderate amount of retained stool is noted in the rectum. Surgical changes are present at the cecum and the appendix is not seen.  Vascular/Lymphatic: Aortic atherosclerosis. No enlarged abdominal or pelvic lymph nodes. Reproductive: The prostate gland is enlarged. Other: No abdominopelvic ascites. There is a left inguinal hernia containing nonobstructed small bowel. Musculoskeletal: Degenerative changes are present in the lumbar spine. No acute osseous abnormality is seen. IMPRESSION: 1. Multiple dilated loops of small bowel in the abdomen with a suspected transition point in the mid pelvis, concerning for small bowel obstruction. Surgical consultation is recommended. 2. Scattered airspace disease in the lungs bilaterally with consolidation in the right upper and lower lobes and left lower lobe, suggesting pneumonia. 3. Vague hypodensity in the head of the pancreas measuring 1.8 cm. Nonemergent MRI is recommended for further characterization. 4. Multi-vessel coronary artery calcifications. 5. Aortic atherosclerosis. Electronically Signed: By: Leita Birmingham M.D. On: 05/20/2024 21:15   DG Chest Port 1 View Result Date: 05/20/2024 CLINICAL DATA:  Possible sepsis.  Respiratory distress. EXAM: PORTABLE CHEST 1 VIEW COMPARISON:  12/31/2023. FINDINGS: The heart size and mediastinal  contours are within normal limits. There is atherosclerotic calcification of the aorta. Scattered airspace disease is noted in the perihilar region on the right and at the left lung base. No effusion or pneumothorax is seen. No acute osseous abnormality. IMPRESSION: Scattered airspace disease in the perihilar region on the right and at the left lung base, concerning for pneumonia. Electronically Signed   By: Leita Birmingham M.D.   On: 05/20/2024 19:58    Anti-infectives: Anti-infectives (From admission, onward)    Start     Dose/Rate Route Frequency Ordered Stop   05/21/24 1700  doxycycline  (VIBRAMYCIN ) 100 mg in sodium chloride  0.9 % 250 mL IVPB        100 mg 125 mL/hr over 120 Minutes Intravenous Every 12 hours 05/21/24 1607     05/21/24 1645  azithromycin   (ZITHROMAX ) 500 mg in sodium chloride  0.9 % 250 mL IVPB  Status:  Discontinued        500 mg 250 mL/hr over 60 Minutes Intravenous Every 24 hours 05/21/24 1549 05/21/24 1558   05/21/24 1645  doxycycline  (VIBRAMYCIN ) 200 mg in dextrose  5 % 250 mL IVPB  Status:  Discontinued        200 mg 125 mL/hr over 120 Minutes Intravenous Every 12 hours 05/21/24 1558 05/21/24 1607   05/21/24 1400  piperacillin -tazobactam (ZOSYN ) IVPB 2.25 g        2.25 g 100 mL/hr over 30 Minutes Intravenous Every 8 hours 05/21/24 0215     05/21/24 0300  vancomycin  (VANCOREADY) IVPB 500 mg/100 mL        500 mg 100 mL/hr over 60 Minutes Intravenous  Once 05/21/24 0215 05/21/24 0449   05/21/24 0215  vancomycin  variable dose per unstable renal function (pharmacist dosing)  Status:  Discontinued         Does not apply See admin instructions 05/21/24 0216 05/22/24 0801   05/20/24 1745  ceFEPIme  (MAXIPIME ) 2 g in sodium chloride  0.9 % 100 mL IVPB        2 g 200 mL/hr over 30 Minutes Intravenous  Once 05/20/24 1737 05/20/24 1853   05/20/24 1745  metroNIDAZOLE  (FLAGYL ) IVPB 500 mg        500 mg 100 mL/hr over 60 Minutes Intravenous  Once 05/20/24 1737 05/20/24 1954   05/20/24 1745  vancomycin  (VANCOCIN ) IVPB 1000 mg/200 mL premix        1,000 mg 200 mL/hr over 60 Minutes Intravenous  Once 05/20/24 1737 05/20/24 2007        Assessment/Plan SBO - CT 7/9 with multiple loops of small bowel with suspected transition in the mid pelvis concerning for SBO - hx of prior hemicolectomy  - NGT was placed and SBO protocol given - KUB this AM with contrast throughout colon - ok to start trickle TF from surgery standpoint when medical team feels he is stable enough  FEN: NPO, IVF per primary, NGT clamped VTE: SQH ID: [email or web address] /doxy  - per CCM -  Acute hypoxic resp failure - vent per CCM Septic shock, likely related to aspiration PNA Toxic metabolic encephalopathy  SBO Ischemic stroke AKI on CKD HTN   LOS: 1 day   I reviewed  Consultant neuro notes, last 24 h vitals and pain scores, last 48 h intake and output, last 24 h labs and trends, last 24 h imaging results, and PCCM notes.  This care required moderate level of medical decision making.    Burnard JONELLE Louder, [facility] 05/22/2024, 11:25 AM Please see Amion  for pager number during day hours 7:00am-4:30pm

## 2024-05-22 NOTE — Progress Notes (Signed)
 STROKE TEAM PROGRESS NOTE   SUBJECTIVE (INTERVAL HISTORY) His RN is at the bedside.  Pt is intubated and not much responsive. Not open eyes or following commands, mild withdraw to pain. Whole body shivering vs. Tremors, upper > lower. Pt does have hypothermia and put on bear hugger.   Reported seizure like activity prior to intubation, was given ativan  and keppra  load. Post intubation on propofol , pt no significant shaking or jerking. EEG negative for seizure. Now off propofol , pt seems to have more jerking at upper body but also has hypothermia.    OBJECTIVE Temp:  [94.2 F (34.6 C)-99 F (37.2 C)] 96.8 F (36 C) (07/11 1330) Pulse Rate:  [49-100] 84 (07/11 1330) Cardiac Rhythm: Normal sinus rhythm (07/11 0800) Resp:  [13-23] 16 (07/11 1330) BP: (78-184)/(21-94) 98/21 (07/11 1330) SpO2:  [97 %-100 %] 100 % (07/11 1330) FiO2 (%):  [40 %] 40 % (07/11 1416) Weight:  [62 kg] 62 kg (07/11 0400)  Recent Labs  Lab 05/21/24 1931 05/21/24 2331 05/22/24 0334 05/22/24 0722 05/22/24 1103  GLUCAP 98 97 98 105* 102*   Recent Labs  Lab 05/20/24 1804 05/20/24 1830 05/20/24 2342 05/21/24 0317 05/21/24 0444 05/22/24 0555  NA 143 138  139 145 142 135 146*  K 3.9 4.7  4.6 3.8 3.8 3.7 4.0  CL 103 105 108  --  104 112*  CO2 16*  --  19*  --  19* 23  GLUCOSE 411* 391* 140*  --  182* 103*  BUN 108* 124* 104*  --  87* 101*  CREATININE 3.76* 3.90* 3.45*  --  2.62* 3.39*  CALCIUM  10.4*  --  9.6  --  8.0* 9.3  MG 3.6*  --   --   --  2.2  --   PHOS  --   --   --   --  3.6  --    Recent Labs  Lab 05/20/24 1804 05/21/24 0444 05/22/24 0555  AST 24 14* 14*  ALT 11 8 10   ALKPHOS 82 47 57  BILITOT 0.5 2.8* 0.6  PROT 7.1 4.5* 5.3*  ALBUMIN 3.0* 1.9* 2.1*   Recent Labs  Lab 05/20/24 1830 05/20/24 1957 05/21/24 0317 05/21/24 0444 05/22/24 0555  WBC  --  11.4*  --  15.8* 10.6*  NEUTROABS  --  10.5*  --   --   --   HGB 12.2*  12.9* 11.3* 8.8* 8.2* 8.6*  HCT 36.0*  38.0* 34.8*  26.0* 24.8* 25.1*  MCV  --  79.8*  --  80.0 76.5*  PLT  --  320  --  198 219   No results for input(s): CKTOTAL, CKMB, CKMBINDEX, TROPONINI in the last 168 hours. Recent Labs    05/20/24 1804  LABPROT 16.3*  INR 1.2   Recent Labs    05/20/24 1800  COLORURINE YELLOW  LABSPEC 1.009  PHURINE 5.0  GLUCOSEU >=500*  HGBUR NEGATIVE  BILIRUBINUR NEGATIVE  KETONESUR NEGATIVE  PROTEINUR 100*  NITRITE NEGATIVE  LEUKOCYTESUR NEGATIVE       Component Value Date/Time   CHOL 78 05/22/2024 0555   TRIG 79 05/22/2024 0556   HDL 36 (L) 05/22/2024 0555   CHOLHDL 2.2 05/22/2024 0555   VLDL 16 05/22/2024 0555   LDLCALC 26 05/22/2024 0555   Lab Results  Component Value Date   HGBA1C 7.6 (H) 05/21/2024      Component Value Date/Time   LABOPIA NONE DETECTED 04/26/2022 1710   COCAINSCRNUR NONE DETECTED 04/26/2022 1710   LABBENZ  NONE DETECTED 04/26/2022 1710   AMPHETMU NONE DETECTED 04/26/2022 1710   THCU NONE DETECTED 04/26/2022 1710   LABBARB NONE DETECTED 04/26/2022 1710    No results for input(s): ETH in the last 168 hours.  I have personally reviewed the radiological images below and agree with the radiology interpretations.  DG Abd Portable 1V Result Date: 05/22/2024 CLINICAL DATA:  Small-bowel obstruction EXAM: PORTABLE ABDOMEN - 1 VIEW COMPARISON:  Abdominal radiograph dated 05/21/2024 FINDINGS: Gastric/enteric tube tip projects over the stomach. Interval progression of radiopaque intraluminal contrast material to the level of the rectum. Distal transverse colon is mildly dilated. No free air or pneumatosis. No abnormal radio-opaque calculi or mass effect. No acute or substantial osseous abnormality. The sacrum and coccyx are partially obscured by overlying bowel contents. IMPRESSION: Interval progression of radiopaque intraluminal contrast material to the level of the rectum. Nonobstructive bowel gas pattern. Electronically Signed   By: Limin  Melba Araki M.D.   On: 05/22/2024  12:07   ECHOCARDIOGRAM COMPLETE Result Date: 05/22/2024    ECHOCARDIOGRAM REPORT   Patient Name:   JOVAUN LEVENE Date of Exam: 05/22/2024 Medical Rec #:  [id number]       Height:       70.0 in Accession #:    [id number]      Weight:       136.7 lb Date of Birth:  September 09, 1933      BSA:          1.776 m Patient Age:    89 years        BP:           146/58 mmHg Patient Gender: M               HR:           78 bpm. Exam Location:  Inpatient Procedure: 2D Echo, Cardiac Doppler and Color Doppler (Both Spectral and Color            Flow Doppler were utilized during procedure). Indications:    Stroke  History:        Patient has prior history of Echocardiogram examinations. Risk                 Factors:Hypertension and Diabetes.  Sonographer:    Christiana Mbomeh Referring Phys: [id number] KHABIB DGAYLI IMPRESSIONS  1. Left ventricular ejection fraction, by estimation, is 60 to 65%. The left ventricle has normal function. The left ventricle has no regional wall motion abnormalities. There is moderate asymmetric left ventricular hypertrophy of the basal-septal segment. Left ventricular diastolic parameters are consistent with Grade I diastolic dysfunction (impaired relaxation).  2. Right ventricular systolic function is normal. The right ventricular size is normal. There is normal pulmonary artery systolic pressure. The estimated right ventricular systolic pressure is 28.6 mmHg.  3. The mitral valve is normal in structure. Trivial mitral valve regurgitation. No evidence of mitral stenosis.  4. The aortic valve was not well visualized. Aortic valve regurgitation is not visualized. Aortic valve sclerosis is present, with no evidence of aortic valve stenosis.  5. The inferior vena cava is dilated in size with >50% respiratory variability, suggesting right atrial pressure of 8 mmHg. FINDINGS  Left Ventricle: Left ventricular ejection fraction, by estimation, is 60 to 65%. The left ventricle has normal function. The left  ventricle has no regional wall motion abnormalities. The left ventricular internal cavity size was normal in size. There is  moderate asymmetric left ventricular hypertrophy of the basal-septal segment. Left ventricular diastolic  parameters are consistent with Grade I diastolic dysfunction (impaired relaxation). Right Ventricle: The right ventricular size is normal. Right vetricular wall thickness was not well visualized. Right ventricular systolic function is normal. There is normal pulmonary artery systolic pressure. The tricuspid regurgitant velocity is 2.27 m/s, and with an assumed right atrial pressure of 8 mmHg, the estimated right ventricular systolic pressure is 28.6 mmHg. Left Atrium: Left atrial size was normal in size. Right Atrium: Right atrial size was normal in size. Pericardium: There is no evidence of pericardial effusion. Mitral Valve: The mitral valve is normal in structure. Trivial mitral valve regurgitation. No evidence of mitral valve stenosis. Tricuspid Valve: The tricuspid valve is normal in structure. Tricuspid valve regurgitation is trivial. Aortic Valve: The aortic valve was not well visualized. Aortic valve regurgitation is not visualized. Aortic valve sclerosis is present, with no evidence of aortic valve stenosis. Aortic valve mean gradient measures 2.0 mmHg. Aortic valve peak gradient measures 3.3 mmHg. Aortic valve area, by VTI measures 4.00 cm. Pulmonic Valve: The pulmonic valve was not well visualized. Pulmonic valve regurgitation is not visualized. Aorta: The aortic root is normal in size and structure. Venous: The inferior vena cava is dilated in size with greater than 50% respiratory variability, suggesting right atrial pressure of 8 mmHg. IAS/Shunts: The interatrial septum was not well visualized.  LEFT VENTRICLE PLAX 2D LVIDd:         4.00 cm   Diastology LVIDs:         2.60 cm   LV e' medial:    3.59 cm/s LV PW:         1.10 cm   LV E/e' medial:  13.5 LV IVS:        1.70 cm    LV e' lateral:   3.70 cm/s LVOT diam:     2.10 cm   LV E/e' lateral: 13.1 LV SV:         75 LV SV Index:   42 LVOT Area:     3.46 cm  RIGHT VENTRICLE            IVC RV S prime:     8.59 cm/s  IVC diam: 2.40 cm TAPSE (M-mode): 1.8 cm LEFT ATRIUM             Index LA Vol (A2C):   22.1 ml 12.45 ml/m LA Vol (A4C):   15.0 ml 8.45 ml/m LA Biplane Vol: 19.2 ml 10.81 ml/m  AORTIC VALVE AV Area (Vmax):    3.58 cm AV Area (Vmean):   3.06 cm AV Area (VTI):     4.00 cm AV Vmax:           91.40 cm/s AV Vmean:          67.300 cm/s AV VTI:            0.188 m AV Peak Grad:      3.3 mmHg AV Mean Grad:      2.0 mmHg LVOT Vmax:         94.60 cm/s LVOT Vmean:        59.500 cm/s LVOT VTI:          0.217 m LVOT/AV VTI ratio: 1.15  AORTA Ao Root diam: 3.60 cm MITRAL VALVE               TRICUSPID VALVE MV Area (PHT): 2.46 cm    TV Peak grad:   20.6 mmHg MV Decel Time: 308 msec    TV Vmax:  2.27 m/s MV E velocity: 48.50 cm/s  TR Peak grad:   20.6 mmHg MV A velocity: 75.70 cm/s  TR Vmax:        227.00 cm/s MV E/A ratio:  0.64                            SHUNTS                            Systemic VTI:  0.22 m                            Systemic Diam: 2.10 cm Lonni Nanas MD Electronically signed by Lonni Nanas MD Signature Date/Time: 05/22/2024/11:20:38 AM    Final    MR ANGIO HEAD WO CONTRAST Result Date: 05/22/2024 CLINICAL DATA:  Follow-up examination for stroke. EXAM: MRA HEAD WITHOUT CONTRAST TECHNIQUE: Angiographic images of the Circle of Willis were acquired using MRA technique without intravenous contrast. COMPARISON:  Prior MRI from 05/21/2024 FINDINGS: Anterior circulation: Both ICAs are patent through the siphons without significant stenosis or other abnormality. A1 segments patent bilaterally. Normal anterior communicating artery complex. Anterior cerebral arteries patent without significant stenosis. No M1 stenosis or occlusion. Distal MCA branches perfused and symmetric. Posterior circulation:  Visualized V4 segments patent without stenosis. Right PICA patent at its origin. Left PICA not seen. Basilar patent without significant stenosis. Superior cerebral arteries patent bilaterally. Right PCA supplied via a robust right PCOM and hypoplastic right P1 segment. Fetal type origin of the left PCA. Both PCAs patent to their distal aspects without significant stenosis. Anatomic variants: None significant. Other: No intracranial aneurysm or other vascular malformation. Ventriculomegaly with a few scattered chronic infarcts, stable. IMPRESSION: Negative intracranial MRA. No large vessel occlusion, hemodynamically significant stenosis, or other acute vascular abnormality. No aneurysm. Electronically Signed   By: Morene Hoard M.D.   On: 05/22/2024 05:46   DG Abd Portable 1V-Small Bowel Obstruction Protocol-initial, 8 hr delay Result Date: 05/21/2024 CLINICAL DATA:  Small bowel obstruction. EXAM: PORTABLE ABDOMEN - 1 VIEW COMPARISON:  Radiograph dated 05/20/2024. FINDINGS: Enteric tube with tip in the proximal stomach. Injected contrast opacifies the stomach. No bowel dilatation. No free air. No acute osseous pathology. IMPRESSION: Enteric tube with tip in the proximal stomach. Electronically Signed   By: Vanetta Chou M.D.   On: 05/21/2024 17:45   MR Brain W and Wo Contrast Result Date: 05/21/2024 CLINICAL DATA:  Provided history: Neuro deficit, acute, stroke suspected. EXAM: MRI HEAD WITHOUT AND WITH CONTRAST TECHNIQUE: Multiplanar, multiecho pulse sequences of the brain and surrounding structures were obtained without and with intravenous contrast. CONTRAST:  7mL GADAVIST  GADOBUTROL  1 MMOL/ML IV SOLN COMPARISON:  Head CT 05/20/2024.  Brain MRI 12/31/2023. FINDINGS: Brain: Ventriculomegaly is unchanged from the prior brain MRI of 12/31/2023. This is at least partly due to cerebral atrophy. However, there is also acute callosal angle and crowding of the sulci at the level of the high frontoparietal  lobes, and these findings can be seen in the setting of normal pressure hydrocephalus (NPH). 2 mm acute infarct within the left frontal lobe centrum semiovale (series 5, image 87). Multifocal T2 FLAIR hyperintense signal abnormality within the cerebral white matter and pons, nonspecific but compatible with moderate-to-advanced chronic small vessel ischemic disease. Unchanged small chronic infarcts within the thalami, pons and right cerebellar hemisphere. Chronic microhemorrhages scattered within the supratentorial brain,  similar to the prior MRI. No evidence of an intracranial mass No extra-axial fluid collection. No midline shift. No pathologic intracranial enhancement identified. Vascular: Maintained flow voids within the proximal large arterial vessels. Skull and upper cervical spine: No focal worrisome marrow lesion. Sinuses/Orbits: No mass or acute finding within the imaged orbits. Prior bilateral ocular lens replacement. No significant paranasal sinus disease. Other: Small-volume fluid within bilateral mastoid air cells. Impression #1 will be called to the ordering clinician or representative by the Radiologist Assistant, and communication documented in the PACS or [employer]. IMPRESSION: 1. 2 mm acute infarct within the left frontal lobe centrum semiovale. 2. Background parenchymal atrophy, chronic small vessel ischemic disease and chronic infarcts, as described. 3. Ventriculomegaly is unchanged from the prior MRI of 12/31/2023. While this is at least partly due to cerebral atrophy, a component of normal pressure hydrocephalus is possible (in the appropriate clinical setting). 4. Small-volume fluid within bilateral mastoid air cells. Electronically Signed   By: Rockey Childs D.O.   On: 05/21/2024 13:32   EEG adult Result Date: 05/21/2024 Shelton Arlin KIDD, MD     05/21/2024  8:47 AM Patient Name: AERON DONAGHEY MRN: [id number] Epilepsy Attending: Arlin KIDD Shelton Referring Physician/Provider: Merrianne Locus, MD Date: 05/21/2024 Duration: 24.55 mins Patient history: 89yo M with ams. EEG to evaluate for seizure Level of alertness: comatose AEDs during EEG study: LEV, Propofol  Technical aspects: This EEG study was done with scalp electrodes positioned according to the 10-20 International system of electrode placement. Electrical activity was reviewed with band pass filter of 1-70Hz , sensitivity of 7 uV/mm, display speed of 59mm/sec with a 60Hz  notched filter applied as appropriate. EEG data were recorded continuously and digitally stored.  Video monitoring was available and reviewed as appropriate. Description: EEG showed burst attenuation pattern with bursts of 5 to 6 Hz theta slowing lasting 2 to 3 seconds alternating with 1 to 2 seconds of generalized EEG attenuation.  Hyperventilation and photic stimulation were not performed.   ABNORMALITY - Burst attenuation, generalized IMPRESSION: This study is suggestive of profound diffuse encephalopathy.  No seizures or epileptiform discharges were seen throughout the recording. Arlin KIDD Shelton   DG Chest Portable 1 View Result Date: 05/21/2024 CLINICAL DATA:  Intubation confirmation EXAM: PORTABLE CHEST 1 VIEW COMPARISON:  05/20/2024 FINDINGS: Endotracheal tube is been placed with tip measuring 6.2 cm above the carina. An enteric tube was placed. Tip projects over the expected location of the EG junction with proximal side hole in the distal esophageal region. Advancement is suggested. Heart size and pulmonary vascularity are normal. Perihilar infiltrates similar to prior study. No pleural effusion or pneumothorax. Calcification of the aorta. IMPRESSION: Endotracheal tube with tip above the carina. Enteric tube tip projects over the lower esophageal region and advancement is suggested. Persistent perihilar infiltrates. Electronically Signed   By: Elsie Gravely M.D.   On: 05/21/2024 02:42   DG Abd Portable 1V-Small Bowel Protocol-Position Verification Result Date:  05/20/2024 CLINICAL DATA:  NG tube placement confirmation EXAM: PORTABLE ABDOMEN - 1 VIEW COMPARISON:  05/20/2024 FINDINGS: Limited field of view for tube placement verification purposes. The enteric tube has been advanced with tip now projecting over the left upper quadrant consistent with location in the body of the stomach. Mild gaseous distention of the stomach. IMPRESSION: Enteric tube has been advanced with tip now projecting over the left upper quadrant consistent with location in the body of the stomach. Electronically Signed   By: Elsie Gravely M.D.   On:  05/20/2024 23:00   DG Abd Portable 1 View Result Date: 05/20/2024 CLINICAL DATA:  NG tube placement EXAM: PORTABLE ABDOMEN - 1 VIEW COMPARISON:  CT 05/20/2024 FINDINGS: Enteric tube tip folded back upon itself and directed cranially in the region of the distal esophagus. Heterogeneous airspace disease at the lung bases. Moderate air distension of the stomach IMPRESSION: Enteric tube tip folded back upon itself and directed cranially in the region of the distal esophagus. Recommend repositioning. Electronically Signed   By: Luke Bun M.D.   On: 05/20/2024 22:13   CT Head Wo Contrast Result Date: 05/20/2024 CLINICAL DATA:  Mental status changes, unknown cause. EXAM: CT HEAD WITHOUT CONTRAST TECHNIQUE: Contiguous axial images were obtained from the base of the skull through the vertex without intravenous contrast. RADIATION DOSE REDUCTION: This exam was performed according to the departmental dose-optimization program which includes automated exposure control, adjustment of the mA and/or kV according to patient size and/or use of iterative reconstruction technique. COMPARISON:  Head CT 12/31/2023. FINDINGS: Brain: There is mild cerebellar volume loss, small chronic right superior cerebellar lacunar infarct. There is moderately advanced cerebral atrophy and atrophic ventriculomegaly with moderate to severe small vessel disease of cerebral white  matter. Chronic dystrophic calcification again noted in the frontal falx, senescent mineralization in the basal ganglia. No acute cortical based infarct, hemorrhage, mass or mass effect is seen. There is no midline shift.  Basal cisterns are patent. Vascular: There are calcifications in the distal vertebral arteries and heavily in the siphons. No hyperdense central vessels. Skull: Negative for fractures or focal lesions. Sinuses/Orbits: No acute findings. Old lens replacements with otherwise negative orbits. Other: None. IMPRESSION: 1. No acute intracranial CT findings or interval changes. 2. Atrophy and small-vessel disease with atrophic ventriculomegaly. Stable exam. 3. Atherosclerosis. Electronically Signed   By: Francis Quam M.D.   On: 05/20/2024 21:49   CT CHEST ABDOMEN PELVIS WO CONTRAST Addendum Date: 05/20/2024 ADDENDUM REPORT: 05/20/2024 21:28 ADDENDUM: Critical Value/emergent results were called by telephone at the time of interpretation on 05/20/2024 at 9:27 pm to provider BERNARDINO FIREMAN , who verbally acknowledged these results. Electronically Signed   By: Leita Birmingham M.D.   On: 05/20/2024 21:28   Result Date: 05/20/2024 CLINICAL DATA:  Sepsis. EXAM: CT CHEST, ABDOMEN AND PELVIS WITHOUT CONTRAST TECHNIQUE: Multidetector CT imaging of the chest, abdomen and pelvis was performed following the standard protocol without IV contrast. RADIATION DOSE REDUCTION: This exam was performed according to the departmental dose-optimization program which includes automated exposure control, adjustment of the mA and/or kV according to patient size and/or use of iterative reconstruction technique. COMPARISON:  09/17/2007. FINDINGS: CT CHEST FINDINGS Cardiovascular: The heart is normal in size and there is a trace pericardial effusion. Three-vessel coronary artery calcifications are noted. There is atherosclerotic calcification of the aorta without evidence of aneurysm. The pulmonary trunk is normal in caliber.  Mediastinum/Nodes: No mediastinal or axillary lymphadenopathy. The thyroid  gland and trachea are within normal limits. Esophagus is mildly distended with fluid. Lungs/Pleura: Scattered airspace opacities are noted in the right lung with consolidation in the right upper and lower lobes. There is consolidation in the left lower lobe with mild airspace disease in the left upper lobe. There is a 3 mm nodule in the left upper lobe, axial image 38. No effusion or pneumothorax is seen. Musculoskeletal: Gynecomastia is noted. Degenerative changes are present in the thoracic spine. No acute osseous abnormality. CT ABDOMEN PELVIS FINDINGS Hepatobiliary: No focal liver abnormality is seen. No gallstones, gallbladder wall  thickening, or biliary dilatation. Pancreas: There is a vague hypodensity in the head of the pancreas measuring 1.8 x 1.2 cm. No pancreatic ductal dilatation or surrounding inflammatory changes. Spleen: Normal in size without focal abnormality. Adrenals/Urinary Tract: The adrenal glands are within normal limits. No renal calculus or hydronephrosis bilaterally. A Foley catheter is present in the urinary bladder. Stomach/Bowel: The stomach is distended with fluid. Multiple dilated loops of small bowel are present in the abdomen measuring up to 4.4 cm in diameter. A suspected transition point is seen in the mid pelvis, sagittal image 71, coronal image 74. There is a left inguinal hernia containing nonobstructed small bowel. No free air or pneumatosis is seen. A moderate amount of retained stool is noted in the rectum. Surgical changes are present at the cecum and the appendix is not seen. Vascular/Lymphatic: Aortic atherosclerosis. No enlarged abdominal or pelvic lymph nodes. Reproductive: The prostate gland is enlarged. Other: No abdominopelvic ascites. There is a left inguinal hernia containing nonobstructed small bowel. Musculoskeletal: Degenerative changes are present in the lumbar spine. No acute osseous  abnormality is seen. IMPRESSION: 1. Multiple dilated loops of small bowel in the abdomen with a suspected transition point in the mid pelvis, concerning for small bowel obstruction. Surgical consultation is recommended. 2. Scattered airspace disease in the lungs bilaterally with consolidation in the right upper and lower lobes and left lower lobe, suggesting pneumonia. 3. Vague hypodensity in the head of the pancreas measuring 1.8 cm. Nonemergent MRI is recommended for further characterization. 4. Multi-vessel coronary artery calcifications. 5. Aortic atherosclerosis. Electronically Signed: By: Leita Birmingham M.D. On: 05/20/2024 21:15   DG Chest Port 1 View Result Date: 05/20/2024 CLINICAL DATA:  Possible sepsis.  Respiratory distress. EXAM: PORTABLE CHEST 1 VIEW COMPARISON:  12/31/2023. FINDINGS: The heart size and mediastinal contours are within normal limits. There is atherosclerotic calcification of the aorta. Scattered airspace disease is noted in the perihilar region on the right and at the left lung base. No effusion or pneumothorax is seen. No acute osseous abnormality. IMPRESSION: Scattered airspace disease in the perihilar region on the right and at the left lung base, concerning for pneumonia. Electronically Signed   By: Leita Birmingham M.D.   On: 05/20/2024 19:58     PHYSICAL EXAM  Temp:  [94.2 F (34.6 C)-99 F (37.2 C)] 96.8 F (36 C) (07/11 1330) Pulse Rate:  [49-100] 84 (07/11 1330) Resp:  [13-23] 16 (07/11 1330) BP: (78-184)/(21-94) 98/21 (07/11 1330) SpO2:  [97 %-100 %] 100 % (07/11 1330) FiO2 (%):  [40 %] 40 % (07/11 1416) Weight:  [62 kg] 62 kg (07/11 0400)  General - Well nourished, well developed, intubated off sedation.  Ophthalmologic - fundi not visualized due to noncooperation.  Cardiovascular - Regular rate and rhythm.  Neuro - intubated off sedation, eyes closed, not following commands. With forced eye opening, eyes in mid position, not blinking to visual threat,  doll's eyes absent, not tracking, pupil equal size, sluggish to light. Corneal reflex weakly present bilaterally, gag and cough weakly present. Breathing over the vent.  Facial symmetry not able to test due to ET tube.  Tongue protrusion not cooperative. On pain stimulation, slight withdraw in all extremities. Sensation, coordination and gait not tested.     ASSESSMENT/PLAN Mr. QUINDELL SHERE is a 89 y.o. male with history of colon cancer status post hemicolectomy, hypertension, diabetes, CKD, dementia admitted for respite failure and seizure-like activity. No TNK given due to outside window.    Metabolic encephalopathy  Seizure like activity Intubated and less responsive Off propofol  Per Dr. Merrianne earlier note, seizure like activity was not felt to be true seizure S/p ativan  and keppra  load MRI without clear seizure focus EEG c/w toxic/metabolic state, but was on propofol  when it was done Now off propofol , pt more tremulous and shivering vs. Semi-rhythmic jerking, upper > lower Pt also has hypothermia Put on bear hugger Continue to monitor, if continue jerking movement with Temp normalizes, will consider LTM EEG  Stroke, incidental finding: left MCA/ACA punctate infarct, likely subacute, etiology small vessel disease CT head no acute finding MRI  2 mm acute infarct within the left frontal lobe centrum semiovale. MRA unremarkable  Carotid Doppler  pending 2D Echo  EF 60-65% LDL 26 HgbA1c 7.6 Heparin  subq for VTE prophylaxis aspirin  81 mg daily prior to admission, now on home aspirin  81 mg daily.  Ongoing aggressive stroke risk factor management Therapy recommendations:  pending Disposition:  pending  Respiratory failure EMS and ED O2 sat 64% Intubated  Vent management per CCM Off propofol   Diabetes with hyperglycemia HgbA1c pending goal < 7.0 Uncontrolled High glucose on presentation s/p insulin  CBG monitoring SSI DM education and close PCP follow up  Small bowel  obstruction  Sepsis  Leukocytosis  LA high with lactic acidosis Pan CT concerning for small bowel obstruction Surgery on board On NGT suctioning On zosyn  and doxycycline   Hypothermia - on bear hugger  WBC 11.4--15.8--10.6  Hypertension Unstable, BP fluctuate Avoid low BP Long term BP goal normotensive  Hyperlipidemia Home meds:  crestor  10  LDL 26, goal < 70 Now off statin given low LDL and NPO status  Other Stroke Risk Factors Advanced age  Other Active Problems Colon cancer s/p hemcolectomy Dementia  CKD IIIb, Cre 3.45--2.62--3.39  Hospital day # 1  This patient is critically ill due to seizure like activity, respiratory failure, sepsis, SBO, stroke and at significant risk of neurological worsening, death form septic shock, status epilepticus, heart failure, kidney failure. This patient's care requires constant monitoring of vital signs, hemodynamics, respiratory and cardiac monitoring, review of multiple databases, neurological assessment, discussion with family, other specialists and medical decision making of high complexity. I spent 40 minutes of neurocritical care time in the care of this patient.    Ary Cummins, MD PhD Stroke Neurology 05/22/2024 2:42 PM    To contact Stroke Continuity provider, please refer to [email or web address]. After hours, contact General Neurology

## 2024-05-22 NOTE — Progress Notes (Signed)
 NAME:  Jonathan Baird, MRN:  [id number], DOB:  May 14, 1933, LOS: 1 ADMISSION DATE:  05/20/2024 History of Present Illness:  89 year old man who presented to [facility] ED 7/10 for respiratory distress. PMHx significant for HTN, HLD, T2DM, stage IV CKD, PVD, R ICA stenosis, colon CA s/p hemicolectomy, dementia. Recent brief admission 2/17-2/18 for progressive weakness and urinary frequency. Recently treated presumed UTI.   Patient initially presented to ED from home for respiratory distress. Reportedly in his USOH 7/8PM, but developed SOB and wet cough 7/9. On ED arrival, patient was afebrile with temp 99.52F, HR 130, BP 150/90, SpO2 64% on RA, improved to 89% with nebulizer and 4LNC. DuoNebs, Solumedrol and Mg administered. GCS 10. CBG 468. Labs were notable for WBC 11.4, Hgb 11.3, Plt 320. Na 143, K 3.9, CO2 16, BUN/Cr 108/3.76 (baseline Cr 2.8-2.9). LFTs WNL. INR 1.2. Trop 37 > 45. BNP 22.1. RVP negative. UA > 500 glucose, otherwise unremarkable. Serum Osm 360. B-hydroxybutyrate 0.25. LA 7.8 > 9.6 > 7.9. CXR with scattered airspace disease (perihilar) c/f PNA; CT Head NAICA. CT Chest/A/P demonstrated multiple loops of SB with suspected transition point mid-pelvis, scattered bilateral airspace disease in the lungs c/w PNA, vague pancreatic head density. Insulin  gtt was started for hyperglycemia and broad-spectrum antibiotics initiated (Vanc/Zosyn ).    While awaiting bed placement in ED, patient had a witnessed seizure lasting about 2 minutes with L eye deviation. Neuro was consulted. Spot EEG demonstrated diffuse slowing without electrographic seizures. Patient remained particularly somnolent and was poorly protecting his airway with gurgling respirations and decision was made to intubate.   PCCM consulted for ICU admission.  Pertinent  Medical History  CAD, CKD stage IV, colon cancer s/p hemicolectomy, dementia, DM, HLD, HTN, PVD  Significant Hospital Events: Including procedures, antibiotic start and stop  dates in addition to other pertinent events   7/9 - Presented to ED with AMS, SOB. 7/10 - PCCM consulted for airway protection, intubated in ED. 7/11-sedation stopped for WUA  Interim History / Subjective:  Episode of hypotension overnight, propofol  rate reduced and Levophed  ordered (not given)  Objective    Blood pressure (!) 109/94, pulse 78, temperature 98.1 F (36.7 C), resp. rate 14, height 5' 10 (1.778 m), weight 62 kg, SpO2 100%.    Vent Mode: PRVC FiO2 (%):  [40 %] 40 % Set Rate:  [16 bmp] 16 bmp Vt Set:  [580 mL] 580 mL PEEP:  [5 cmH20] 5 cmH20 Plateau Pressure:  [14 cmH20-15 cmH20] 14 cmH20   Intake/Output Summary (Last 24 hours) at 05/22/2024 0924 Last data filed at 05/22/2024 0800 Gross per 24 hour  Intake 1828.21 ml  Output 1800 ml  Net 28.21 ml   Filed Weights   05/21/24 0229 05/22/24 0400  Weight: 65 kg 62 kg    Examination: General: Sitting up in bed, intubated. HENT: Dry mucous membranes, normocephalic and atraumatic Lungs: CTAB, normal WOB on vent Cardiovascular: RRR, normal S1/S2 Abdomen: Hypoactive bowel sounds, soft, nondistended Extremities: No edema to BLE, overgrown toenails Neuro: Not responding to commands, moving BLE intermittently  Resolved problem list  None Assessment and Plan   NEURO #Acute encephalopathy superimposed on underlying dementia  # 2 mm acute left frontal lobe infarct # Concern for seizure on admission On Keppra  initially, this was DC'd 7/10 been low concern for actual seizure,  spot eeg neg, no seizure focus on MRI. CT head without acute abnormality.  MRI with small stroke as above.  Currently off sedation WUA -Stroke team following  --  Lipid panel WNL, A1c 7.6, TSH WNL  -- Negative intracranial MRI  -- Echo with normal LV EF and function, G1 DD, otherwise normal  -- Follow-up carotid ultrasound  -- Daily ASA 81 mg --Pending GOC conversation today   PULM #Acute hypoxic resp failure, likely in the setting of  aspiration pneumonia Remains intubated, WUA today. -VAP pulm hygiene -DuoNeb q6h - SBT today    ID # Concern for aspiration pneumonia Afebrile overnight, downtrending leukocytosis.  Blood cultures NGTD --Continue broad-spectrum antibiotics with Zosyn , doxycycline  --Follow-up respiratory culture -- Monitor CBCs  CARDIOVASCULAR #Shock- likely septic and hypovolemic with PNA as above #HTN #R ICA stenosis #HLD #PVD Hypertensive overnight, required one-time as needed labetalol  5 mg.  Pressures well-controlled currently.  Normal echo. -home meds on hold for now - As needed hydral, labetalol    GI #SBO, unclear etiology #Hyperbilirubinemia #Hx colon ca s/p hemicolectomy NG in place.  Surgery following, a.m. KUB with contrast throughout colon.  Okay to start trickle tube feeds per surgery. --Tube feeds per RD  RENAL # AKI on CKD in the setting of shock/hypovolemia -- Continue to monitor with daily BMPs -- Avoid nephrotoxic agents -- Repleat electrolytes as needed  ENDOCRINE #DM2 w hyperglycemia --Moderate SSI  Best Practice (right click and Reselect all SmartList Selections daily)   Diet/type: tubefeeds DVT prophylaxis prophylactic heparin   Pressure ulcer(s): present on admission  GI prophylaxis: PPI Lines: N/A Foley:  Yes, and it is still needed Code Status:  full code Last date of multidisciplinary goals of care discussion [pending discussion today]  Labs   CBC: Recent Labs  Lab 05/20/24 1830 05/20/24 1957 05/21/24 0317 05/21/24 0444 05/22/24 0555  WBC  --  11.4*  --  15.8* 10.6*  NEUTROABS  --  10.5*  --   --   --   HGB 12.2*  12.9* 11.3* 8.8* 8.2* 8.6*  HCT 36.0*  38.0* 34.8* 26.0* 24.8* 25.1*  MCV  --  79.8*  --  80.0 76.5*  PLT  --  320  --  198 219    Basic Metabolic Panel: Recent Labs  Lab 05/20/24 1804 05/20/24 1830 05/20/24 2342 05/21/24 0317 05/21/24 0444 05/22/24 0555  NA 143 138  139 145 142 135 146*  K 3.9 4.7  4.6 3.8 3.8 3.7  4.0  CL 103 105 108  --  104 112*  CO2 16*  --  19*  --  19* 23  GLUCOSE 411* 391* 140*  --  182* 103*  BUN 108* 124* 104*  --  87* 101*  CREATININE 3.76* 3.90* 3.45*  --  2.62* 3.39*  CALCIUM  10.4*  --  9.6  --  8.0* 9.3  MG 3.6*  --   --   --  2.2  --   PHOS  --   --   --   --  3.6  --    GFR: Estimated Creatinine Clearance: 13.2 mL/min (A) (by C-G formula based on SCr of 3.39 mg/dL (H)). Recent Labs  Lab 05/20/24 1957 05/20/24 2018 05/20/24 2232 05/21/24 0119 05/21/24 0444 05/22/24 0555  WBC 11.4*  --   --   --  15.8* 10.6*  LATICACIDVEN  --  9.6* 7.9* 5.4* 3.1*  --     Liver Function Tests: Recent Labs  Lab 05/20/24 1804 05/21/24 0444 05/22/24 0555  AST 24 14* 14*  ALT 11 8 10   ALKPHOS 82 47 57  BILITOT 0.5 2.8* 0.6  PROT 7.1 4.5* 5.3*  ALBUMIN 3.0* 1.9* 2.1*  No results for input(s): LIPASE, AMYLASE in the last 168 hours. Recent Labs  Lab 05/21/24 1224  AMMONIA <13    ABG    Component Value Date/Time   PHART 7.406 05/21/2024 0317   PCO2ART 35.2 05/21/2024 0317   PO2ART 431 (H) 05/21/2024 0317   HCO3 22.0 05/21/2024 0317   TCO2 23 05/21/2024 0317   ACIDBASEDEF 2.0 05/21/2024 0317   O2SAT 100 05/21/2024 0317     Coagulation Profile: Recent Labs  Lab 05/20/24 1804  INR 1.2    Cardiac Enzymes: No results for input(s): CKTOTAL, CKMB, CKMBINDEX, TROPONINI in the last 168 hours.  HbA1C: Hgb A1c MFr Bld  Date/Time Value Ref Range Status  05/21/2024 04:44 AM 7.6 (H) 4.8 - 5.6 % Final    Comment:    (NOTE)         Prediabetes: 5.7 - 6.4         Diabetes: >6.4         Glycemic control for adults with diabetes: <7.0   10/20/2023 01:25 AM 8.2 (H) 4.8 - 5.6 % Final    Comment:    (NOTE)         Prediabetes: 5.7 - 6.4         Diabetes: >6.4         Glycemic control for adults with diabetes: <7.0     CBG: Recent Labs  Lab 05/21/24 1540 05/21/24 1931 05/21/24 2331 05/22/24 0334 05/22/24 0722  GLUCAP 159* 98 97 98 105*     Review of Systems:   As above  Past Medical History:  He,  has a past medical history of Carotid artery stenosis, CKD (chronic kidney disease), stage IV (HCC), Colon cancer (HCC) (10/12/2004), Dementia (HCC), Diabetes mellitus, HLD (hyperlipidemia), HTN (hypertension), and Peripheral vascular disease (HCC).   Surgical History:   Past Surgical History:  Procedure Laterality Date   hemocolectomy  11/13/2003     Social History:   reports that he has never smoked. He has never used smokeless tobacco. He reports that he does not drink alcohol and does not use drugs.   Family History:  His family history includes Cancer in his mother and sister; Diabetes in his father.   Allergies No Known Allergies   Home Medications  Prior to Admission medications   Medication Sig Start Date End Date Taking? Authorizing Provider  amLODipine  (NORVASC ) 10 MG tablet Take 10 mg by mouth daily. 03/05/18   [provider]  aspirin  (ASPIRIN  81) 81 MG chewable tablet 1 tablet    [provider]  calcitRIOL (ROCALTROL) 0.25 MCG capsule Take 0.25 mcg by mouth daily. 11/02/23   [provider]  doxycycline  (VIBRA -TABS) 100 MG tablet Take 100 mg by mouth 2 (two) times daily. 05/11/24   [provider]  ferrous sulfate  325 (65 FE) MG tablet Take 1 tablet (325 mg total) by mouth daily with breakfast. 10/26/23   Vann, Jessica U, DO  fluticasone (FLONASE) 50 MCG/ACT nasal spray Place 1 spray into both nostrils 2 (two) times daily. 03/12/24   [provider]  GVOKE HYPOPEN 2-PACK 1 MG/0.2ML SOAJ Inject 1 mg into the skin once as needed (for hypoglycemia). 02/08/22   [provider]  hydrALAZINE  (APRESOLINE ) 50 MG tablet Take 50 mg by mouth 3 (three) times daily. 12/03/23   [provider]  hydrochlorothiazide (HYDRODIURIL) 25 MG tablet Take 25 mg by mouth daily. 11/04/23   [provider]  insulin  lispro (HUMALOG KWIKPEN) 100 UNIT/ML KwikPen Inject  4  Units into the skin 3 (three) times daily after meals.    [provider]  LANTUS  SOLOSTAR 100 UNIT/ML Solostar Pen  03/12/24   [provider]  lisinopril (PRINIVIL,ZESTRIL) 40 MG tablet Take 40 mg by mouth daily.    [provider]  rosuvastatin  (CRESTOR ) 10 MG tablet Take 10 mg by mouth daily.    [provider]  sodium bicarbonate  650 MG tablet Take 650 mg by mouth daily. 07/16/15   [provider]  tamsulosin  (FLOMAX ) 0.4 MG CAPS capsule Take 1 capsule (0.4 mg total) by mouth daily after breakfast. 01/01/24   Raenelle Coria, MD  TRULICITY  1.5 MG/0.5ML SOAJ Inject into the skin. 12/04/23   [provider]     Critical care time: 30 min

## 2024-05-22 NOTE — Progress Notes (Signed)
 Nutrition Brief Note  Patient discussed in interdisciplinary rounds.  Consult received for initiation of trickle tube feeds. Patient now with return of bowel function.  Per Surgery note, okay to start trickles today with plans for advancement tomorrow given tolerance.  Tube feed recommendations as below: Trickle tube feeding via NGT- Vital 1.5 at 44ml/hr  Thiamine  100mg  daily x5 days d/t refeeding risk.   Once tolerance established, recommend advancing Vital 1.5 by 10ml q12h to goal rate of 22ml/hr ( per day) Provides 1620 kcal, 72g protein, free water  daily   Pending further tube feeding advancement, recommend addition of Juven to support wound healing.   Will continue to follow for tube feeding tolerance and adjust nutrition interventions as appropriate.   [name], RDN, LDN Clinical Nutrition See AMiON for contact information.

## 2024-05-22 NOTE — Plan of Care (Signed)
   Problem: Activity: Goal: Ability to tolerate increased activity will improve Outcome: Not Progressing   Problem: Respiratory: Goal: Ability to maintain a clear airway and adequate ventilation will improve Outcome: Not Progressing   Problem: Role Relationship: Goal: Method of communication will improve Outcome: Not Progressing   Problem: Education: Goal: Knowledge of General Education information will improve Description: Including pain rating scale, medication(s)/side effects and non-pharmacologic comfort measures Outcome: Not Progressing   Problem: Health Behavior/Discharge Planning: Goal: Ability to manage health-related needs will improve Outcome: Not Progressing

## 2024-05-22 NOTE — Progress Notes (Signed)
 LTM EEG hooked up and running - no initial skin breakdown - push button tested - [facility] monitoring.

## 2024-05-22 NOTE — Progress Notes (Signed)
 PT Cancellation Note  Patient Details Name: Jonathan Baird MRN: [id number] DOB: [birth year]   Cancelled Treatment:    Reason Eval/Treat Not Completed: Medical issues which prohibited therapy; patient ventilated and sedated.  Will follow up another day.   Montie Portal 05/22/2024, 12:43 PM Micheline Portal, PT Acute Rehabilitation Services [phone] 05/22/2024

## 2024-05-22 NOTE — Progress Notes (Signed)
 eLink Physician-Brief Progress Note Patient Name: Jonathan Baird DOB: [birth year] MRN: [id number]   Date of Service  05/22/2024  HPI/Events of Note  BP 78/42, MAP 54, HR 61  eICU Interventions  Propofol  gtt rate reduced, peripheral Levophed  gtt ordered.        Adasia Hoar U Caniya Tagle 05/22/2024, 12:31 AM

## 2024-05-23 ENCOUNTER — Inpatient Hospital Stay (HOSPITAL_COMMUNITY)

## 2024-05-23 DIAGNOSIS — J9601 Acute respiratory failure with hypoxia: Secondary | ICD-10-CM | POA: Diagnosis not present

## 2024-05-23 DIAGNOSIS — G9341 Metabolic encephalopathy: Secondary | ICD-10-CM

## 2024-05-23 DIAGNOSIS — E119 Type 2 diabetes mellitus without complications: Secondary | ICD-10-CM

## 2024-05-23 DIAGNOSIS — N1832 Chronic kidney disease, stage 3b: Secondary | ICD-10-CM

## 2024-05-23 DIAGNOSIS — E1122 Type 2 diabetes mellitus with diabetic chronic kidney disease: Secondary | ICD-10-CM

## 2024-05-23 DIAGNOSIS — R4182 Altered mental status, unspecified: Secondary | ICD-10-CM

## 2024-05-23 DIAGNOSIS — J690 Pneumonitis due to inhalation of food and vomit: Secondary | ICD-10-CM | POA: Diagnosis not present

## 2024-05-23 DIAGNOSIS — R569 Unspecified convulsions: Secondary | ICD-10-CM | POA: Diagnosis not present

## 2024-05-23 DIAGNOSIS — J9690 Respiratory failure, unspecified, unspecified whether with hypoxia or hypercapnia: Secondary | ICD-10-CM | POA: Diagnosis not present

## 2024-05-23 DIAGNOSIS — K56609 Unspecified intestinal obstruction, unspecified as to partial versus complete obstruction: Secondary | ICD-10-CM | POA: Diagnosis not present

## 2024-05-23 DIAGNOSIS — E1151 Type 2 diabetes mellitus with diabetic peripheral angiopathy without gangrene: Secondary | ICD-10-CM | POA: Diagnosis not present

## 2024-05-23 DIAGNOSIS — I63512 Cerebral infarction due to unspecified occlusion or stenosis of left middle cerebral artery: Secondary | ICD-10-CM | POA: Diagnosis not present

## 2024-05-23 LAB — GLUCOSE, CAPILLARY
Glucose-Capillary: 137 mg/dL — ABNORMAL HIGH (ref 70–99)
Glucose-Capillary: 121 mg/dL — ABNORMAL HIGH (ref 70–99)
Glucose-Capillary: 114 mg/dL — ABNORMAL HIGH (ref 70–99)
Glucose-Capillary: 200 mg/dL — ABNORMAL HIGH (ref 70–99)
Glucose-Capillary: 169 mg/dL — ABNORMAL HIGH (ref 70–99)
Glucose-Capillary: 150 mg/dL — ABNORMAL HIGH (ref 70–99)

## 2024-05-23 LAB — BASIC METABOLIC PANEL WITH GFR
Anion gap: 6 (ref 5–15)
BUN: 100 mg/dL — ABNORMAL HIGH (ref 8–23)
CO2: 28 mmol/L (ref 22–32)
Calcium: 9.3 mg/dL (ref 8.9–10.3)
Chloride: 115 mmol/L — ABNORMAL HIGH (ref 98–111)
Creatinine, Ser: 3.66 mg/dL — ABNORMAL HIGH (ref 0.61–1.24)
GFR, Estimated: 15 mL/min — ABNORMAL LOW (ref >60)
Glucose, Bld: 130 mg/dL — ABNORMAL HIGH (ref 70–99)
Potassium: 4.2 mmol/L (ref 3.5–5.1)
Sodium: 149 mmol/L — ABNORMAL HIGH (ref 135–145)

## 2024-05-23 LAB — CBC WITH DIFFERENTIAL/PLATELET
Abs Immature Granulocytes: 0.21 K/uL — ABNORMAL HIGH (ref 0.00–0.07)
Basophils Absolute: 0 K/uL (ref 0.0–0.1)
Basophils Relative: 0 %
Eosinophils Absolute: 0 K/uL (ref 0.0–0.5)
Eosinophils Relative: 0 %
HCT: 26.8 % — ABNORMAL LOW (ref 39.0–52.0)
Hemoglobin: 9 g/dL — ABNORMAL LOW (ref 13.0–17.0)
Immature Granulocytes: 1 %
Lymphocytes Relative: 7 %
Lymphs Abs: 1.1 K/uL (ref 0.7–4.0)
MCH: 26.3 pg (ref 26.0–34.0)
MCHC: 33.6 g/dL (ref 30.0–36.0)
MCV: 78.4 fL — ABNORMAL LOW (ref 80.0–100.0)
Monocytes Absolute: 0.8 K/uL (ref 0.1–1.0)
Monocytes Relative: 5 %
Neutro Abs: 13.5 K/uL — ABNORMAL HIGH (ref 1.7–7.7)
Neutrophils Relative %: 87 %
Platelets: 218 K/uL (ref 150–400)
RBC: 3.42 MIL/uL — ABNORMAL LOW (ref 4.22–5.81)
RDW: 15.4 % (ref 11.5–15.5)
WBC: 15.6 K/uL — ABNORMAL HIGH (ref 4.0–10.5)
nRBC: 0 % (ref 0.0–0.2)

## 2024-05-23 LAB — CULTURE, RESPIRATORY W GRAM STAIN: Culture: NORMAL

## 2024-05-23 NOTE — Progress Notes (Signed)
 NAME:  Jonathan Baird, MRN:  [id number], DOB:  09-May-1933, LOS: 2 ADMISSION DATE:  05/20/2024 History of Present Illness:  89 year old man who presented to [facility] ED 7/10 for respiratory distress. PMHx significant for HTN, HLD, T2DM, stage IV CKD, PVD, R ICA stenosis, colon CA s/p hemicolectomy, dementia. Recent brief admission 2/17-2/18 for progressive weakness and urinary frequency. Recently treated presumed UTI.   Patient initially presented to ED from home for respiratory distress. Reportedly in his USOH 7/8PM, but developed SOB and wet cough 7/9. On ED arrival, patient was afebrile with temp 99.26F, HR 130, BP 150/90, SpO2 64% on RA, improved to 89% with nebulizer and 4LNC. DuoNebs, Solumedrol and Mg administered. GCS 10. CBG 468. Labs were notable for WBC 11.4, Hgb 11.3, Plt 320. Na 143, K 3.9, CO2 16, BUN/Cr 108/3.76 (baseline Cr 2.8-2.9). LFTs WNL. INR 1.2. Trop 37 > 45. BNP 22.1. RVP negative. UA > 500 glucose, otherwise unremarkable. Serum Osm 360. B-hydroxybutyrate 0.25. LA 7.8 > 9.6 > 7.9. CXR with scattered airspace disease (perihilar) c/f PNA; CT Head NAICA. CT Chest/A/P demonstrated multiple loops of SB with suspected transition point mid-pelvis, scattered bilateral airspace disease in the lungs c/w PNA, vague pancreatic head density. Insulin  gtt was started for hyperglycemia and broad-spectrum antibiotics initiated (Vanc/Zosyn ).    While awaiting bed placement in ED, patient had a witnessed seizure lasting about 2 minutes with L eye deviation. Neuro was consulted. Spot EEG demonstrated diffuse slowing without electrographic seizures. Patient remained particularly somnolent and was poorly protecting his airway with gurgling respirations and decision was made to intubate.   PCCM consulted for ICU admission.  Pertinent  Medical History  CAD, CKD stage IV, colon cancer s/p hemicolectomy, dementia, DM, HLD, HTN, PVD  Significant Hospital Events: Including procedures, antibiotic start and stop  dates in addition to other pertinent events   7/9 - Presented to ED with AMS, SOB. 7/10 - PCCM consulted for airway protection, intubated in ED. 7/11-sedation stopped for WUA  Interim History / Subjective:  Responsive to verbal stimulation Not following commands Currently restrained, not on sedative  Objective    Blood pressure (!) 170/73, pulse (!) 59, temperature 98.1 F (36.7 C), resp. rate 16, height 5' 10 (1.778 m), weight 62 kg, SpO2 100%.    Vent Mode: PRVC FiO2 (%):  [40 %] 40 % Set Rate:  [16 bmp] 16 bmp Vt Set:  [580 mL] 580 mL PEEP:  [5 cmH20] 5 cmH20 Plateau Pressure:  [14 cmH20] 14 cmH20   Intake/Output Summary (Last 24 hours) at 05/23/2024 0807 Last data filed at 05/23/2024 0743 Gross per 24 hour  Intake 1011.69 ml  Output 1550 ml  Net -538.31 ml   Filed Weights   05/21/24 0229 05/22/24 0400 05/23/24 0107  Weight: 65 kg 62 kg 62 kg    Examination: General: Elderly, frail HENT: Dry mucous membranes, normocephalic, atraumatic Lungs: Clear breath sounds Cardiovascular: S1-S2 appreciated Abdomen: Bowel sounds appreciated Extremities: No edema, no clubbing Neuro: Moving around, responsive to voice, not able to follow commands  Resolved problem list  None Assessment and Plan   Metabolic encephalopathy Background dementia Acute left frontal lobe infarct-2 mm Seizures - On Keppra , did receive Ativan  - Ongoing continuous EEG - Off sedation  Subacute stroke 2 mm in the left frontal lobe syndrome 70 - MRA negative - On aspirin  - Appreciate neurology follow-up  Acute hypoxemic respiratory failure Aspiration pneumonia -Continue broad-spectrum antibiotics - Continue mechanical ventilation -Target TVol 6-8cc/kgIBW -Target Plateau Pressure < 30cm H20 -  Target driving pressure less than 15 cm of water  -Target PaO2 55-65: titrate PEEP/FiO2 per protocol -Ventilator associated pneumonia prevention protocol -continue bronchodilators  Continue  bronchodilators  Type 2 DM -continue SSI  AKI - Avoid nephrotoxic medications - Maintain renal perfusion  Nutrition - Started on trickle feeds  History of colon cancer s/p hemicolectomy  Mental status precludes extubation   Best Practice (right click and Reselect all SmartList Selections daily)   Diet/type: tubefeeds DVT prophylaxis prophylactic heparin   Pressure ulcer(s): present on admission  GI prophylaxis: PPI Lines: N/A Foley:  Yes, and it is still needed Code Status:  full code Last date of multidisciplinary goals of care discussion [pending discussion today]  Labs   CBC: Recent Labs  Lab 05/20/24 1957 05/21/24 0317 05/21/24 0444 05/22/24 0555 05/23/24 0419  WBC 11.4*  --  15.8* 10.6* 15.6*  NEUTROABS 10.5*  --   --   --  13.5*  HGB 11.3* 8.8* 8.2* 8.6* 9.0*  HCT 34.8* 26.0* 24.8* 25.1* 26.8*  MCV 79.8*  --  80.0 76.5* 78.4*  PLT 320  --  198 219 218    Basic Metabolic Panel: Recent Labs  Lab 05/20/24 1804 05/20/24 1830 05/20/24 2342 05/21/24 0317 05/21/24 0444 05/22/24 0555 05/23/24 0419  NA 143 138  139 145 142 135 146* 149*  K 3.9 4.7  4.6 3.8 3.8 3.7 4.0 4.2  CL 103 105 108  --  104 112* 115*  CO2 16*  --  19*  --  19* 23 28  GLUCOSE 411* 391* 140*  --  182* 103* 130*  BUN 108* 124* 104*  --  87* 101* 100*  CREATININE 3.76* 3.90* 3.45*  --  2.62* 3.39* 3.66*  CALCIUM  10.4*  --  9.6  --  8.0* 9.3 9.3  MG 3.6*  --   --   --  2.2  --   --   PHOS  --   --   --   --  3.6  --   --    GFR: Estimated Creatinine Clearance: 12.2 mL/min (A) (by C-G formula based on SCr of 3.66 mg/dL (H)). Recent Labs  Lab 05/20/24 1957 05/20/24 2018 05/20/24 2232 05/21/24 0119 05/21/24 0444 05/22/24 0555 05/22/24 1113 05/23/24 0419  WBC 11.4*  --   --   --  15.8* 10.6*  --  15.6*  LATICACIDVEN  --    < > 7.9* 5.4* 3.1*  --  2.2*  --    < > = values in this interval not displayed.    Liver Function Tests: Recent Labs  Lab 05/20/24 1804  05/21/24 0444 05/22/24 0555  AST 24 14* 14*  ALT 11 8 10   ALKPHOS 82 47 57  BILITOT 0.5 2.8* 0.6  PROT 7.1 4.5* 5.3*  ALBUMIN 3.0* 1.9* 2.1*   No results for input(s): LIPASE, AMYLASE in the last 168 hours. Recent Labs  Lab 05/21/24 1224  AMMONIA <13    ABG    Component Value Date/Time   PHART 7.406 05/21/2024 0317   PCO2ART 35.2 05/21/2024 0317   PO2ART 431 (H) 05/21/2024 0317   HCO3 22.0 05/21/2024 0317   TCO2 23 05/21/2024 0317   ACIDBASEDEF 2.0 05/21/2024 0317   O2SAT 100 05/21/2024 0317     Coagulation Profile: Recent Labs  Lab 05/20/24 1804  INR 1.2    Cardiac Enzymes: No results for input(s): CKTOTAL, CKMB, CKMBINDEX, TROPONINI in the last 168 hours.  HbA1C: Hgb A1c MFr Bld  Date/Time Value Ref Range  Status  05/21/2024 04:44 AM 7.6 (H) 4.8 - 5.6 % Final    Comment:    (NOTE)         Prediabetes: 5.7 - 6.4         Diabetes: >6.4         Glycemic control for adults with diabetes: <7.0   10/20/2023 01:25 AM 8.2 (H) 4.8 - 5.6 % Final    Comment:    (NOTE)         Prediabetes: 5.7 - 6.4         Diabetes: >6.4         Glycemic control for adults with diabetes: <7.0     CBG: Recent Labs  Lab 05/22/24 1522 05/22/24 1946 05/22/24 2321 05/23/24 0316 05/23/24 0737  GLUCAP 126* 96 121* 137* 121*    Review of Systems:   As above  Past Medical History:  He,  has a past medical history of Carotid artery stenosis, CKD (chronic kidney disease), stage IV (HCC), Colon cancer (HCC) (10/12/2004), Dementia (HCC), Diabetes mellitus, HLD (hyperlipidemia), HTN (hypertension), and Peripheral vascular disease (HCC).   Surgical History:   Past Surgical History:  Procedure Laterality Date   hemocolectomy  11/13/2003     Social History:   reports that he has never smoked. He has never used smokeless tobacco. He reports that he does not drink alcohol and does not use drugs.   Family History:  His family history includes Cancer in his mother  and sister; Diabetes in his father.   Allergies No Known Allergies   The patient is critically ill with multiple organ systems failure and requires high complexity decision making for assessment and support, frequent evaluation and titration of therapies, application of advanced monitoring technologies and extensive interpretation of multiple databases. Critical Care Time devoted to patient care services described in this note independent of APP/resident time (if applicable)  is 32 minutes.   Jennet Epley MD [facility] Personal pager: See Amion If unanswered, please page CCM On-call: #[phone]

## 2024-05-23 NOTE — Progress Notes (Signed)
 OT Cancellation Note  Patient Details Name: Jonathan Baird MRN: [id number] DOB: April 29, 1933   Cancelled Treatment:    Reason Eval/Treat Not Completed: Patient not medically ready (SBP 175 on arrival. Pt continues to be sedated on the vent. OT evaluation to continue efforts when pt is medically appropriate to participate.)  [name] 05/23/2024, 11:27 AM

## 2024-05-23 NOTE — Progress Notes (Signed)
LTM EEG disconnected - no skin breakdown at unhook. [facility] notified.  

## 2024-05-23 NOTE — Progress Notes (Signed)
 Progress Note     Subjective: Pt sedated on the vent, no family at bedside. Per nursing, patient had multiple BM yesterday.   Objective: Vital signs in last 24 hours: Temp:  [96.4 F (35.8 C)-99.7 F (37.6 C)] 97.7 F (36.5 C) (07/12 0930) Pulse Rate:  [59-113] 72 (07/12 0930) Resp:  [9-24] 16 (07/12 0930) BP: (82-177)/(21-116) 125/61 (07/12 0930) SpO2:  [100 %] 100 % (07/12 0930) FiO2 (%):  [40 %] 40 % (07/12 0803) Weight:  [62 kg] 62 kg (07/12 0107) Last BM Date : 05/22/24  Intake/Output from previous day: 07/11 0701 - 07/12 0700 In: 1163.9 [I.V.:6.7; NG/GT:365; IV Piggyback:792.2] Out: 1450 [Urine:1450] Intake/Output this shift: Total I/O In: 60 [NG/GT:60] Out: 100 [Urine:100]  PE: General: intubated, sedated Heart: regular, rate, and rhythm.   Lungs: vent Abd: soft, ND, prior laparotomy scar, NGT clamped, No response to palpation of abdomen   Lab Results:  Recent Labs    05/22/24 0555 05/23/24 0419  WBC 10.6* 15.6*  HGB 8.6* 9.0*  HCT 25.1* 26.8*  PLT 219 218   BMET Recent Labs    05/22/24 0555 05/23/24 0419  NA 146* 149*  K 4.0 4.2  CL 112* 115*  CO2 23 28  GLUCOSE 103* 130*  BUN 101* 100*  CREATININE 3.39* 3.66*  CALCIUM  9.3 9.3   PT/INR Recent Labs    05/20/24 1804  LABPROT 16.3*  INR 1.2   CMP     Component Value Date/Time   NA 149 (H) 05/23/2024 0419   NA 139 08/25/2015 1329   K 4.2 05/23/2024 0419   K 4.7 08/25/2015 1329   CL 115 (H) 05/23/2024 0419   CL 99 05/12/2010 1332   CO2 28 05/23/2024 0419   CO2 25 08/25/2015 1329   GLUCOSE 130 (H) 05/23/2024 0419   GLUCOSE 218 (H) 08/25/2015 1329   GLUCOSE 94 05/12/2010 1332   BUN 100 (H) 05/23/2024 0419   BUN 33.3 (H) 08/25/2015 1329   CREATININE 3.66 (H) 05/23/2024 0419   CREATININE 2.5 (H) 08/25/2015 1329   CALCIUM  9.3 05/23/2024 0419   CALCIUM  9.4 08/25/2015 1329   PROT 5.3 (L) 05/22/2024 0555   PROT 7.9 08/25/2015 1329   ALBUMIN 2.1 (L) 05/22/2024 0555   ALBUMIN 4.0  08/25/2015 1329   AST 14 (L) 05/22/2024 0555   AST 12 08/25/2015 1329   ALT 10 05/22/2024 0555   ALT 15 08/25/2015 1329   ALKPHOS 57 05/22/2024 0555   ALKPHOS 98 08/25/2015 1329   BILITOT 0.6 05/22/2024 0555   BILITOT 0.39 08/25/2015 1329   GFRNONAA 15 (L) 05/23/2024 0419   Lipase  No results found for: LIPASE     Studies/Results: Overnight EEG with video Result Date: 05/23/2024 Shelton Arlin KIDD, MD     05/23/2024 10:32 AM Patient Name: LASEAN GORNIAK MRN: [id number] Epilepsy Attending: Arlin KIDD Shelton Referring Physician/Provider: Jerri Pfeiffer, MD Duration: 05/22/2024 2052 to 05/23/2024 1018 Patient history: 89yo M with ams. EEG to evaluate for seizure Level of alertness: Awake, asleep AEDs during EEG study: None Technical aspects: This EEG study was done with scalp electrodes positioned according to the 10-20 International system of electrode placement. Electrical activity was reviewed with band pass filter of 1-70Hz , sensitivity of 7 uV/mm, display speed of 48mm/sec with a 60Hz  notched filter applied as appropriate. EEG data were recorded continuously and digitally stored.  Video monitoring was available and reviewed as appropriate. Description: EEG showed continuous generalized 3 to 6 Hz theta-delta slowing. Sleep was characterized  by vertex waves, sleep spindles (12 to 14 Hz), maximal frontocentral region.  Generalized periodic discharges with triphasic morphology were noted at  1-1.5Hz , more prominent when awake/stimulated. Patient was noted to have episodes of brief upper extremity tremors/jerking intermittently like on 05/22/2024 2122 and 2214.  Concomitant EEG did not show any EEG changes to suggest seizure. Hyperventilation and photic stimulation were not performed.   ABNORMALITY - Periodic discharges with triphasic morphology, generalized ( GPDs) - Continuous slow, generalized IMPRESSION: This study showed generalized periodic discharges with triphasic morphology. This eeg pattern can be  on the ictal-interictal continuum. However, the frequency, morphology and reactivity to stimulation is typically sen due to toxic- metabolic causes. Additionally there is moderate diffuse encephalopathy. Patient was noted to have episodes of brief upper extremity tremors/jerking intermittently like on 05/22/2024 2122 and 2214 without concomitant EEG change. These events were most likely not epileptic. Priyanka O Yadav   VAS US  CAROTID Result Date: 05/22/2024 Carotid Arterial Duplex Study Patient Name:  ZAYVON ALICEA  Date of Exam:   05/22/2024 Medical Rec #: [id number]        Accession #:    [id number] Date of Birth: 88-Feb-1934       Patient Gender: M Patient Age:   89 years Exam Location:  [facility] Procedure:      VAS US  CAROTID Referring Phys: KHABIB DGAYLI --------------------------------------------------------------------------------  Indications:       CVA and Carotid artery disease. Risk Factors:      Hypertension, hyperlipidemia, Diabetes. Limitations        Today's exam was limited due to patient on a ventilator and                    Unable to turn head to scan right side. Comparison Study:  Prior study done 10/20/23 indicating right 40-59% ICA stenosis                    and 1-39% ICA stenosis Performing Technologist: Alberta Lis RVS  Examination Guidelines: A complete evaluation includes B-mode imaging, spectral Doppler, color Doppler, and power Doppler as needed of all accessible portions of each vessel. Bilateral testing is considered an integral part of a complete examination. Limited examinations for reoccurring indications may be performed as noted.  Right Carotid Findings: +----------+--------+--------+--------+------------------+--------+           PSV cm/sEDV cm/sStenosisPlaque DescriptionComments +----------+--------+--------+--------+------------------+--------+ CCA Prox  65      2               heterogenous                +----------+--------+--------+--------+------------------+--------+ CCA Distal86      6               heterogenous               +----------+--------+--------+--------+------------------+--------+ +----------+--------+-------+--------+-------------------+           PSV cm/sEDV cmsDescribeArm Pressure (mmHG) +----------+--------+-------+--------+-------------------+ [id number]                                         +----------+--------+-------+--------+-------------------+ +---------+--------+--+--------+-+ VertebralPSV cm/s82EDV cm/s2 +---------+--------+--+--------+-+  Left Carotid Findings: +----------+--------+--------+--------+------------------+--------+           PSV cm/sEDV cm/sStenosisPlaque DescriptionComments +----------+--------+--------+--------+------------------+--------+ CCA Prox  111     11              heterogenous               +----------+--------+--------+--------+------------------+--------+  CCA Distal101     5               heterogenous               +----------+--------+--------+--------+------------------+--------+ ICA Prox  59      12      1-39%                              +----------+--------+--------+--------+------------------+--------+ ICA Mid   107     15                                         +----------+--------+--------+--------+------------------+--------+ ICA Distal114     8                                          +----------+--------+--------+--------+------------------+--------+ ECA       118     8               heterogenous               +----------+--------+--------+--------+------------------+--------+ +----------+--------+--------+--------+-------------------+           PSV cm/sEDV cm/sDescribeArm Pressure (mmHG) +----------+--------+--------+--------+-------------------+ Dlarojcpjw00                                          +----------+--------+--------+--------+-------------------+  +---------+--------+--+--------+-+ VertebralPSV cm/s53EDV cm/s1 +---------+--------+--+--------+-+   Summary: Right Carotid: Unable to visualize ICA/ECA secondary to ventilation. Vertebrals:  Bilateral vertebral arteries demonstrate antegrade flow. Subclavians: Normal flow hemodynamics were seen in bilateral subclavian              arteries. *See table(s) above for measurements and observations.     Preliminary    DG Abd Portable 1V Result Date: 05/22/2024 CLINICAL DATA:  Small-bowel obstruction EXAM: PORTABLE ABDOMEN - 1 VIEW COMPARISON:  Abdominal radiograph dated 05/21/2024 FINDINGS: Gastric/enteric tube tip projects over the stomach. Interval progression of radiopaque intraluminal contrast material to the level of the rectum. Distal transverse colon is mildly dilated. No free air or pneumatosis. No abnormal radio-opaque calculi or mass effect. No acute or substantial osseous abnormality. The sacrum and coccyx are partially obscured by overlying bowel contents. IMPRESSION: Interval progression of radiopaque intraluminal contrast material to the level of the rectum. Nonobstructive bowel gas pattern. Electronically Signed   By: Limin  Xu M.D.   On: 05/22/2024 12:07   ECHOCARDIOGRAM COMPLETE Result Date: 05/22/2024    ECHOCARDIOGRAM REPORT   Patient Name:   MILEY LINDON Date of Exam: 05/22/2024 Medical Rec #:  [id number]       Height:       70.0 in Accession #:    [id number]      Weight:       136.7 lb Date of Birth:  09-Jan-1933      BSA:          1.776 m Patient Age:    89 years        BP:           146/58 mmHg Patient Gender: M               HR:  78 bpm. Exam Location:  Inpatient Procedure: 2D Echo, Cardiac Doppler and Color Doppler (Both Spectral and Color            Flow Doppler were utilized during procedure). Indications:    Stroke  History:        Patient has prior history of Echocardiogram examinations. Risk                 Factors:Hypertension and Diabetes.  Sonographer:    Christiana  Mbomeh Referring Phys: [id number] KHABIB DGAYLI IMPRESSIONS  1. Left ventricular ejection fraction, by estimation, is 60 to 65%. The left ventricle has normal function. The left ventricle has no regional wall motion abnormalities. There is moderate asymmetric left ventricular hypertrophy of the basal-septal segment. Left ventricular diastolic parameters are consistent with Grade I diastolic dysfunction (impaired relaxation).  2. Right ventricular systolic function is normal. The right ventricular size is normal. There is normal pulmonary artery systolic pressure. The estimated right ventricular systolic pressure is 28.6 mmHg.  3. The mitral valve is normal in structure. Trivial mitral valve regurgitation. No evidence of mitral stenosis.  4. The aortic valve was not well visualized. Aortic valve regurgitation is not visualized. Aortic valve sclerosis is present, with no evidence of aortic valve stenosis.  5. The inferior vena cava is dilated in size with >50% respiratory variability, suggesting right atrial pressure of 8 mmHg. FINDINGS  Left Ventricle: Left ventricular ejection fraction, by estimation, is 60 to 65%. The left ventricle has normal function. The left ventricle has no regional wall motion abnormalities. The left ventricular internal cavity size was normal in size. There is  moderate asymmetric left ventricular hypertrophy of the basal-septal segment. Left ventricular diastolic parameters are consistent with Grade I diastolic dysfunction (impaired relaxation). Right Ventricle: The right ventricular size is normal. Right vetricular wall thickness was not well visualized. Right ventricular systolic function is normal. There is normal pulmonary artery systolic pressure. The tricuspid regurgitant velocity is 2.27 m/s, and with an assumed right atrial pressure of 8 mmHg, the estimated right ventricular systolic pressure is 28.6 mmHg. Left Atrium: Left atrial size was normal in size. Right Atrium: Right atrial  size was normal in size. Pericardium: There is no evidence of pericardial effusion. Mitral Valve: The mitral valve is normal in structure. Trivial mitral valve regurgitation. No evidence of mitral valve stenosis. Tricuspid Valve: The tricuspid valve is normal in structure. Tricuspid valve regurgitation is trivial. Aortic Valve: The aortic valve was not well visualized. Aortic valve regurgitation is not visualized. Aortic valve sclerosis is present, with no evidence of aortic valve stenosis. Aortic valve mean gradient measures 2.0 mmHg. Aortic valve peak gradient measures 3.3 mmHg. Aortic valve area, by VTI measures 4.00 cm. Pulmonic Valve: The pulmonic valve was not well visualized. Pulmonic valve regurgitation is not visualized. Aorta: The aortic root is normal in size and structure. Venous: The inferior vena cava is dilated in size with greater than 50% respiratory variability, suggesting right atrial pressure of 8 mmHg. IAS/Shunts: The interatrial septum was not well visualized.  LEFT VENTRICLE PLAX 2D LVIDd:         4.00 cm   Diastology LVIDs:         2.60 cm   LV e' medial:    3.59 cm/s LV PW:         1.10 cm   LV E/e' medial:  13.5 LV IVS:        1.70 cm   LV e' lateral:   3.70 cm/s LVOT  diam:     2.10 cm   LV E/e' lateral: 13.1 LV SV:         75 LV SV Index:   42 LVOT Area:     3.46 cm  RIGHT VENTRICLE            IVC RV S prime:     8.59 cm/s  IVC diam: 2.40 cm TAPSE (M-mode): 1.8 cm LEFT ATRIUM             Index LA Vol (A2C):   22.1 ml 12.45 ml/m LA Vol (A4C):   15.0 ml 8.45 ml/m LA Biplane Vol: 19.2 ml 10.81 ml/m  AORTIC VALVE AV Area (Vmax):    3.58 cm AV Area (Vmean):   3.06 cm AV Area (VTI):     4.00 cm AV Vmax:           91.40 cm/s AV Vmean:          67.300 cm/s AV VTI:            0.188 m AV Peak Grad:      3.3 mmHg AV Mean Grad:      2.0 mmHg LVOT Vmax:         94.60 cm/s LVOT Vmean:        59.500 cm/s LVOT VTI:          0.217 m LVOT/AV VTI ratio: 1.15  AORTA Ao Root diam: 3.60 cm MITRAL VALVE                TRICUSPID VALVE MV Area (PHT): 2.46 cm    TV Peak grad:   20.6 mmHg MV Decel Time: 308 msec    TV Vmax:        2.27 m/s MV E velocity: 48.50 cm/s  TR Peak grad:   20.6 mmHg MV A velocity: 75.70 cm/s  TR Vmax:        227.00 cm/s MV E/A ratio:  0.64                            SHUNTS                            Systemic VTI:  0.22 m                            Systemic Diam: 2.10 cm Lonni Nanas MD Electronically signed by Lonni Nanas MD Signature Date/Time: 05/22/2024/11:20:38 AM    Final    MR ANGIO HEAD WO CONTRAST Result Date: 05/22/2024 CLINICAL DATA:  Follow-up examination for stroke. EXAM: MRA HEAD WITHOUT CONTRAST TECHNIQUE: Angiographic images of the Circle of Willis were acquired using MRA technique without intravenous contrast. COMPARISON:  Prior MRI from 05/21/2024 FINDINGS: Anterior circulation: Both ICAs are patent through the siphons without significant stenosis or other abnormality. A1 segments patent bilaterally. Normal anterior communicating artery complex. Anterior cerebral arteries patent without significant stenosis. No M1 stenosis or occlusion. Distal MCA branches perfused and symmetric. Posterior circulation: Visualized V4 segments patent without stenosis. Right PICA patent at its origin. Left PICA not seen. Basilar patent without significant stenosis. Superior cerebral arteries patent bilaterally. Right PCA supplied via a robust right PCOM and hypoplastic right P1 segment. Fetal type origin of the left PCA. Both PCAs patent to their distal aspects without significant stenosis. Anatomic variants: None significant. Other: No intracranial aneurysm or other vascular  malformation. Ventriculomegaly with a few scattered chronic infarcts, stable. IMPRESSION: Negative intracranial MRA. No large vessel occlusion, hemodynamically significant stenosis, or other acute vascular abnormality. No aneurysm. Electronically Signed   By: Morene Hoard M.D.   On: 05/22/2024 05:46    DG Abd Portable 1V-Small Bowel Obstruction Protocol-initial, 8 hr delay Result Date: 05/21/2024 CLINICAL DATA:  Small bowel obstruction. EXAM: PORTABLE ABDOMEN - 1 VIEW COMPARISON:  Radiograph dated 05/20/2024. FINDINGS: Enteric tube with tip in the proximal stomach. Injected contrast opacifies the stomach. No bowel dilatation. No free air. No acute osseous pathology. IMPRESSION: Enteric tube with tip in the proximal stomach. Electronically Signed   By: Vanetta Chou M.D.   On: 05/21/2024 17:45   MR Brain W and Wo Contrast Result Date: 05/21/2024 CLINICAL DATA:  Provided history: Neuro deficit, acute, stroke suspected. EXAM: MRI HEAD WITHOUT AND WITH CONTRAST TECHNIQUE: Multiplanar, multiecho pulse sequences of the brain and surrounding structures were obtained without and with intravenous contrast. CONTRAST:  7mL GADAVIST  GADOBUTROL  1 MMOL/ML IV SOLN COMPARISON:  Head CT 05/20/2024.  Brain MRI 12/31/2023. FINDINGS: Brain: Ventriculomegaly is unchanged from the prior brain MRI of 12/31/2023. This is at least partly due to cerebral atrophy. However, there is also acute callosal angle and crowding of the sulci at the level of the high frontoparietal lobes, and these findings can be seen in the setting of normal pressure hydrocephalus (NPH). 2 mm acute infarct within the left frontal lobe centrum semiovale (series 5, image 87). Multifocal T2 FLAIR hyperintense signal abnormality within the cerebral white matter and pons, nonspecific but compatible with moderate-to-advanced chronic small vessel ischemic disease. Unchanged small chronic infarcts within the thalami, pons and right cerebellar hemisphere. Chronic microhemorrhages scattered within the supratentorial brain, similar to the prior MRI. No evidence of an intracranial mass No extra-axial fluid collection. No midline shift. No pathologic intracranial enhancement identified. Vascular: Maintained flow voids within the proximal large arterial vessels. Skull  and upper cervical spine: No focal worrisome marrow lesion. Sinuses/Orbits: No mass or acute finding within the imaged orbits. Prior bilateral ocular lens replacement. No significant paranasal sinus disease. Other: Small-volume fluid within bilateral mastoid air cells. Impression #1 will be called to the ordering clinician or representative by the Radiologist Assistant, and communication documented in the PACS or [employer]. IMPRESSION: 1. 2 mm acute infarct within the left frontal lobe centrum semiovale. 2. Background parenchymal atrophy, chronic small vessel ischemic disease and chronic infarcts, as described. 3. Ventriculomegaly is unchanged from the prior MRI of 12/31/2023. While this is at least partly due to cerebral atrophy, a component of normal pressure hydrocephalus is possible (in the appropriate clinical setting). 4. Small-volume fluid within bilateral mastoid air cells. Electronically Signed   By: Rockey Childs D.O.   On: 05/21/2024 13:32    Anti-infectives: Anti-infectives (From admission, onward)    Start     Dose/Rate Route Frequency Ordered Stop   05/21/24 1700  doxycycline  (VIBRAMYCIN ) 100 mg in sodium chloride  0.9 % 250 mL IVPB        100 mg 125 mL/hr over 120 Minutes Intravenous Every 12 hours 05/21/24 1607     05/21/24 1645  azithromycin  (ZITHROMAX ) 500 mg in sodium chloride  0.9 % 250 mL IVPB  Status:  Discontinued        500 mg 250 mL/hr over 60 Minutes Intravenous Every 24 hours 05/21/24 1549 05/21/24 1558   05/21/24 1645  doxycycline  (VIBRAMYCIN ) 200 mg in dextrose  5 % 250 mL IVPB  Status:  Discontinued  200 mg 125 mL/hr over 120 Minutes Intravenous Every 12 hours 05/21/24 1558 05/21/24 1607   05/21/24 1400  piperacillin -tazobactam (ZOSYN ) IVPB 2.25 g        2.25 g 100 mL/hr over 30 Minutes Intravenous Every 8 hours 05/21/24 0215     05/21/24 0300  vancomycin  (VANCOREADY) IVPB 500 mg/100 mL        500 mg 100 mL/hr over 60 Minutes Intravenous  Once 05/21/24 0215  05/21/24 0449   05/21/24 0215  vancomycin  variable dose per unstable renal function (pharmacist dosing)  Status:  Discontinued         Does not apply See admin instructions 05/21/24 0216 05/22/24 0801   05/20/24 1745  ceFEPIme  (MAXIPIME ) 2 g in sodium chloride  0.9 % 100 mL IVPB        2 g 200 mL/hr over 30 Minutes Intravenous  Once 05/20/24 1737 05/20/24 1853   05/20/24 1745  metroNIDAZOLE  (FLAGYL ) IVPB 500 mg        500 mg 100 mL/hr over 60 Minutes Intravenous  Once 05/20/24 1737 05/20/24 1954   05/20/24 1745  vancomycin  (VANCOCIN ) IVPB 1000 mg/200 mL premix        1,000 mg 200 mL/hr over 60 Minutes Intravenous  Once 05/20/24 1737 05/20/24 2007        Assessment/Plan SBO - Now having multiple BM and abdomen is soft. - Okay to titrate TF to goal - Surgery will sign off. Please call with questions/concerns  FEN: TF VTE: SQH ID: [email or web address] /doxy  - per CCM -  Acute hypoxic resp failure - vent per CCM Septic shock, likely related to aspiration PNA Toxic metabolic encephalopathy  SBO Ischemic stroke AKI on CKD HTN   LOS: 2 days   I reviewed Consultant neuro notes, last 24 h vitals and pain scores, last 48 h intake and output, last 24 h labs and trends, last 24 h imaging results, and PCCM notes.  This care required moderate level of medical decision making.    Cordella DELENA Idler, MD  [facility] 05/23/2024, 10:54 AM Please see Amion for pager number during day hours 7:00am-4:30pm

## 2024-05-23 NOTE — Procedures (Addendum)
 Patient Name: Jonathan Baird  MRN: [id number]  Epilepsy Attending: Arlin MALVA Krebs  Referring Physician/Provider: Jerri Pfeiffer, MD  Duration: 05/22/2024 2052 to 05/23/2024 1018  Patient history: 89yo M with ams. EEG to evaluate for seizure   Level of alertness: Awake, asleep  AEDs during EEG study: None  Technical aspects: This EEG study was done with scalp electrodes positioned according to the 10-20 International system of electrode placement. Electrical activity was reviewed with band pass filter of 1-70Hz , sensitivity of 7 uV/mm, display speed of 76mm/sec with a 60Hz  notched filter applied as appropriate. EEG data were recorded continuously and digitally stored.  Video monitoring was available and reviewed as appropriate.  Description: EEG showed continuous generalized 3 to 6 Hz theta-delta slowing. Sleep was characterized by vertex waves, sleep spindles (12 to 14 Hz), maximal frontocentral region.  Generalized periodic discharges with triphasic morphology were noted at  1-1.5Hz , more prominent when awake/stimulated.  Patient was noted to have episodes of brief upper extremity tremors/jerking intermittently like on 05/22/2024 2122 and 2214.  Concomitant EEG did not show any EEG changes to suggest seizure.  Hyperventilation and photic stimulation were not performed.     ABNORMALITY - Periodic discharges with triphasic morphology, generalized ( GPDs) - Continuous slow, generalized  IMPRESSION: This study showed generalized periodic discharges with triphasic morphology. This eeg pattern can be on the ictal-interictal continuum. However, the frequency, morphology and reactivity to stimulation is typically sen due to toxic- metabolic causes. Additionally there is moderate diffuse encephalopathy.  Patient was noted to have episodes of brief upper extremity tremors/jerking intermittently like on 05/22/2024 2122 and 2214 without concomitant EEG change. These events were most likely not  epileptic.  [name]

## 2024-05-23 NOTE — Progress Notes (Signed)
 STROKE TEAM PROGRESS NOTE   SUBJECTIVE (INTERVAL HISTORY) His RN is at the bedside.  Pt is intubated and and sedated.  Responds to sternal rub but does not follow commands.  Is having some intermittent jerking in the right upper extremity sounds like asterixis rather than seizures.  Long-term EEG monitoring shows triphasic waves and GPDs and no definite seizure activity.  No family at the bedside.  Vital signs stable. OBJECTIVE Temp:  [96.3 F (35.7 C)-99.7 F (37.6 C)] 97.3 F (36.3 C) (07/12 1245) Pulse Rate:  [56-113] 66 (07/12 1245) Cardiac Rhythm: Normal sinus rhythm (07/12 0800) Resp:  [9-24] 14 (07/12 1245) BP: (82-200)/(21-116) 152/67 (07/12 1245) SpO2:  [100 %] 100 % (07/12 1245) FiO2 (%):  [40 %] 40 % (07/12 1113) Weight:  [62 kg] 62 kg (07/12 0107)  Recent Labs  Lab 05/22/24 1946 05/22/24 2321 05/23/24 0316 05/23/24 0737 05/23/24 1124  GLUCAP 96 121* 137* 121* 114*   Recent Labs  Lab 05/20/24 1804 05/20/24 1830 05/20/24 2342 05/21/24 0317 05/21/24 0444 05/22/24 0555 05/23/24 0419  NA 143 138  139 145 142 135 146* 149*  K 3.9 4.7  4.6 3.8 3.8 3.7 4.0 4.2  CL 103 105 108  --  104 112* 115*  CO2 16*  --  19*  --  19* 23 28  GLUCOSE 411* 391* 140*  --  182* 103* 130*  BUN 108* 124* 104*  --  87* 101* 100*  CREATININE 3.76* 3.90* 3.45*  --  2.62* 3.39* 3.66*  CALCIUM  10.4*  --  9.6  --  8.0* 9.3 9.3  MG 3.6*  --   --   --  2.2  --   --   PHOS  --   --   --   --  3.6  --   --    Recent Labs  Lab 05/20/24 1804 05/21/24 0444 05/22/24 0555  AST 24 14* 14*  ALT 11 8 10   ALKPHOS 82 47 57  BILITOT 0.5 2.8* 0.6  PROT 7.1 4.5* 5.3*  ALBUMIN 3.0* 1.9* 2.1*   Recent Labs  Lab 05/20/24 1957 05/21/24 0317 05/21/24 0444 05/22/24 0555 05/23/24 0419  WBC 11.4*  --  15.8* 10.6* 15.6*  NEUTROABS 10.5*  --   --   --  13.5*  HGB 11.3* 8.8* 8.2* 8.6* 9.0*  HCT 34.8* 26.0* 24.8* 25.1* 26.8*  MCV 79.8*  --  80.0 76.5* 78.4*  PLT 320  --  198 219 218   No  results for input(s): CKTOTAL, CKMB, CKMBINDEX, TROPONINI in the last 168 hours. Recent Labs    05/20/24 1804  LABPROT 16.3*  INR 1.2   Recent Labs    05/20/24 1800  COLORURINE YELLOW  LABSPEC 1.009  PHURINE 5.0  GLUCOSEU >=500*  HGBUR NEGATIVE  BILIRUBINUR NEGATIVE  KETONESUR NEGATIVE  PROTEINUR 100*  NITRITE NEGATIVE  LEUKOCYTESUR NEGATIVE       Component Value Date/Time   CHOL 78 05/22/2024 0555   TRIG 79 05/22/2024 0556   HDL 36 (L) 05/22/2024 0555   CHOLHDL 2.2 05/22/2024 0555   VLDL 16 05/22/2024 0555   LDLCALC 26 05/22/2024 0555   Lab Results  Component Value Date   HGBA1C 7.6 (H) 05/21/2024      Component Value Date/Time   LABOPIA NONE DETECTED 04/26/2022 1710   COCAINSCRNUR NONE DETECTED 04/26/2022 1710   LABBENZ NONE DETECTED 04/26/2022 1710   AMPHETMU NONE DETECTED 04/26/2022 1710   THCU NONE DETECTED 04/26/2022 1710   LABBARB NONE  DETECTED 04/26/2022 1710    No results for input(s): ETH in the last 168 hours.  I have personally reviewed the radiological images below and agree with the radiology interpretations.  VAS US  CAROTID Result Date: 05/23/2024 Carotid Arterial Duplex Study Patient Name:  Jonathan Baird  Date of Exam:   05/22/2024 Medical Rec #: [id number]        Accession #:    [id number] Date of Birth: 03-03-1933       Patient Gender: M Patient Age:   18 years Exam Location:  [facility] Procedure:      VAS US  CAROTID Referring Phys: KHABIB DGAYLI --------------------------------------------------------------------------------  Indications:       CVA and Carotid artery disease. Risk Factors:      Hypertension, hyperlipidemia, Diabetes. Limitations        Today's exam was limited due to patient on a ventilator and                    Unable to turn head to scan right side. Comparison Study:  Prior study done 10/20/23 indicating right 40-59% ICA stenosis                    and 1-39% ICA stenosis Performing Technologist: Alberta Lis  RVS  Examination Guidelines: A complete evaluation includes B-mode imaging, spectral Doppler, color Doppler, and power Doppler as needed of all accessible portions of each vessel. Bilateral testing is considered an integral part of a complete examination. Limited examinations for reoccurring indications may be performed as noted.  Right Carotid Findings: +----------+--------+--------+--------+------------------+--------+           PSV cm/sEDV cm/sStenosisPlaque DescriptionComments +----------+--------+--------+--------+------------------+--------+ CCA Prox  65      2               heterogenous               +----------+--------+--------+--------+------------------+--------+ CCA Distal86      6               heterogenous               +----------+--------+--------+--------+------------------+--------+ +----------+--------+-------+--------+-------------------+           PSV cm/sEDV cmsDescribeArm Pressure (mmHG) +----------+--------+-------+--------+-------------------+ [id number]                                         +----------+--------+-------+--------+-------------------+ +---------+--------+--+--------+-+ VertebralPSV cm/s82EDV cm/s2 +---------+--------+--+--------+-+  Left Carotid Findings: +----------+--------+--------+--------+------------------+--------+           PSV cm/sEDV cm/sStenosisPlaque DescriptionComments +----------+--------+--------+--------+------------------+--------+ CCA Prox  111     11              heterogenous               +----------+--------+--------+--------+------------------+--------+ CCA Distal101     5               heterogenous               +----------+--------+--------+--------+------------------+--------+ ICA Prox  59      12      1-39%                              +----------+--------+--------+--------+------------------+--------+ ICA Mid   107     15                                          +----------+--------+--------+--------+------------------+--------+  ICA Distal114     8                                          +----------+--------+--------+--------+------------------+--------+ ECA       118     8               heterogenous               +----------+--------+--------+--------+------------------+--------+ +----------+--------+--------+--------+-------------------+           PSV cm/sEDV cm/sDescribeArm Pressure (mmHG) +----------+--------+--------+--------+-------------------+ [id number]                                          +----------+--------+--------+--------+-------------------+ +---------+--------+--+--------+-+ VertebralPSV cm/s53EDV cm/s1 +---------+--------+--+--------+-+   Summary: Right Carotid: Unable to visualize ICA/ECA secondary to ventilation. Vertebrals:  Bilateral vertebral arteries demonstrate antegrade flow. Subclavians: Normal flow hemodynamics were seen in bilateral subclavian              arteries. *See table(s) above for measurements and observations.  Electronically signed by Eather Popp MD on 05/23/2024 at 11:44:43 AM.    Final    Overnight EEG with video Result Date: 05/23/2024 Shelton Arlin KIDD, MD     05/23/2024 10:32 AM Patient Name: Jonathan Baird MRN: [id number] Epilepsy Attending: Arlin KIDD Shelton Referring Physician/Provider: Jerri Pfeiffer, MD Duration: 05/22/2024 2052 to 05/23/2024 1018 Patient history: 89yo M with ams. EEG to evaluate for seizure Level of alertness: Awake, asleep AEDs during EEG study: None Technical aspects: This EEG study was done with scalp electrodes positioned according to the 10-20 International system of electrode placement. Electrical activity was reviewed with band pass filter of 1-70Hz , sensitivity of 7 uV/mm, display speed of 55mm/sec with a 60Hz  notched filter applied as appropriate. EEG data were recorded continuously and digitally stored.  Video monitoring was available and reviewed as appropriate.  Description: EEG showed continuous generalized 3 to 6 Hz theta-delta slowing. Sleep was characterized by vertex waves, sleep spindles (12 to 14 Hz), maximal frontocentral region.  Generalized periodic discharges with triphasic morphology were noted at  1-1.5Hz , more prominent when awake/stimulated. Patient was noted to have episodes of brief upper extremity tremors/jerking intermittently like on 05/22/2024 2122 and 2214.  Concomitant EEG did not show any EEG changes to suggest seizure. Hyperventilation and photic stimulation were not performed.   ABNORMALITY - Periodic discharges with triphasic morphology, generalized ( GPDs) - Continuous slow, generalized IMPRESSION: This study showed generalized periodic discharges with triphasic morphology. This eeg pattern can be on the ictal-interictal continuum. However, the frequency, morphology and reactivity to stimulation is typically sen due to toxic- metabolic causes. Additionally there is moderate diffuse encephalopathy. Patient was noted to have episodes of brief upper extremity tremors/jerking intermittently like on 05/22/2024 2122 and 2214 without concomitant EEG change. These events were most likely not epileptic. Arlin KIDD Shelton   DG Abd Portable 1V Result Date: 05/22/2024 CLINICAL DATA:  Small-bowel obstruction EXAM: PORTABLE ABDOMEN - 1 VIEW COMPARISON:  Abdominal radiograph dated 05/21/2024 FINDINGS: Gastric/enteric tube tip projects over the stomach. Interval progression of radiopaque intraluminal contrast material to the level of the rectum. Distal transverse colon is mildly dilated. No free air or pneumatosis. No abnormal radio-opaque calculi or mass effect. No acute or substantial osseous abnormality. The sacrum and coccyx are partially obscured by overlying  bowel contents. IMPRESSION: Interval progression of radiopaque intraluminal contrast material to the level of the rectum. Nonobstructive bowel gas pattern. Electronically Signed   By: Limin  Xu M.D.    On: 05/22/2024 12:07   ECHOCARDIOGRAM COMPLETE Result Date: 05/22/2024    ECHOCARDIOGRAM REPORT   Patient Name:   Jonathan Baird Date of Exam: 05/22/2024 Medical Rec #:  [id number]       Height:       70.0 in Accession #:    [id number]      Weight:       136.7 lb Date of Birth:  Apr 03, 1933      BSA:          1.776 m Patient Age:    89 years        BP:           146/58 mmHg Patient Gender: M               HR:           78 bpm. Exam Location:  Inpatient Procedure: 2D Echo, Cardiac Doppler and Color Doppler (Both Spectral and Color            Flow Doppler were utilized during procedure). Indications:    Stroke  History:        Patient has prior history of Echocardiogram examinations. Risk                 Factors:Hypertension and Diabetes.  Sonographer:    Christiana Mbomeh Referring Phys: [id number] KHABIB DGAYLI IMPRESSIONS  1. Left ventricular ejection fraction, by estimation, is 60 to 65%. The left ventricle has normal function. The left ventricle has no regional wall motion abnormalities. There is moderate asymmetric left ventricular hypertrophy of the basal-septal segment. Left ventricular diastolic parameters are consistent with Grade I diastolic dysfunction (impaired relaxation).  2. Right ventricular systolic function is normal. The right ventricular size is normal. There is normal pulmonary artery systolic pressure. The estimated right ventricular systolic pressure is 28.6 mmHg.  3. The mitral valve is normal in structure. Trivial mitral valve regurgitation. No evidence of mitral stenosis.  4. The aortic valve was not well visualized. Aortic valve regurgitation is not visualized. Aortic valve sclerosis is present, with no evidence of aortic valve stenosis.  5. The inferior vena cava is dilated in size with >50% respiratory variability, suggesting right atrial pressure of 8 mmHg. FINDINGS  Left Ventricle: Left ventricular ejection fraction, by estimation, is 60 to 65%. The left ventricle has normal function.  The left ventricle has no regional wall motion abnormalities. The left ventricular internal cavity size was normal in size. There is  moderate asymmetric left ventricular hypertrophy of the basal-septal segment. Left ventricular diastolic parameters are consistent with Grade I diastolic dysfunction (impaired relaxation). Right Ventricle: The right ventricular size is normal. Right vetricular wall thickness was not well visualized. Right ventricular systolic function is normal. There is normal pulmonary artery systolic pressure. The tricuspid regurgitant velocity is 2.27 m/s, and with an assumed right atrial pressure of 8 mmHg, the estimated right ventricular systolic pressure is 28.6 mmHg. Left Atrium: Left atrial size was normal in size. Right Atrium: Right atrial size was normal in size. Pericardium: There is no evidence of pericardial effusion. Mitral Valve: The mitral valve is normal in structure. Trivial mitral valve regurgitation. No evidence of mitral valve stenosis. Tricuspid Valve: The tricuspid valve is normal in structure. Tricuspid valve regurgitation is trivial. Aortic Valve: The aortic valve was not well  visualized. Aortic valve regurgitation is not visualized. Aortic valve sclerosis is present, with no evidence of aortic valve stenosis. Aortic valve mean gradient measures 2.0 mmHg. Aortic valve peak gradient measures 3.3 mmHg. Aortic valve area, by VTI measures 4.00 cm. Pulmonic Valve: The pulmonic valve was not well visualized. Pulmonic valve regurgitation is not visualized. Aorta: The aortic root is normal in size and structure. Venous: The inferior vena cava is dilated in size with greater than 50% respiratory variability, suggesting right atrial pressure of 8 mmHg. IAS/Shunts: The interatrial septum was not well visualized.  LEFT VENTRICLE PLAX 2D LVIDd:         4.00 cm   Diastology LVIDs:         2.60 cm   LV e' medial:    3.59 cm/s LV PW:         1.10 cm   LV E/e' medial:  13.5 LV IVS:         1.70 cm   LV e' lateral:   3.70 cm/s LVOT diam:     2.10 cm   LV E/e' lateral: 13.1 LV SV:         75 LV SV Index:   42 LVOT Area:     3.46 cm  RIGHT VENTRICLE            IVC RV S prime:     8.59 cm/s  IVC diam: 2.40 cm TAPSE (M-mode): 1.8 cm LEFT ATRIUM             Index LA Vol (A2C):   22.1 ml 12.45 ml/m LA Vol (A4C):   15.0 ml 8.45 ml/m LA Biplane Vol: 19.2 ml 10.81 ml/m  AORTIC VALVE AV Area (Vmax):    3.58 cm AV Area (Vmean):   3.06 cm AV Area (VTI):     4.00 cm AV Vmax:           91.40 cm/s AV Vmean:          67.300 cm/s AV VTI:            0.188 m AV Peak Grad:      3.3 mmHg AV Mean Grad:      2.0 mmHg LVOT Vmax:         94.60 cm/s LVOT Vmean:        59.500 cm/s LVOT VTI:          0.217 m LVOT/AV VTI ratio: 1.15  AORTA Ao Root diam: 3.60 cm MITRAL VALVE               TRICUSPID VALVE MV Area (PHT): 2.46 cm    TV Peak grad:   20.6 mmHg MV Decel Time: 308 msec    TV Vmax:        2.27 m/s MV E velocity: 48.50 cm/s  TR Peak grad:   20.6 mmHg MV A velocity: 75.70 cm/s  TR Vmax:        227.00 cm/s MV E/A ratio:  0.64                            SHUNTS                            Systemic VTI:  0.22 m                            Systemic Diam:  2.10 cm Lonni Nanas MD Electronically signed by Lonni Nanas MD Signature Date/Time: 05/22/2024/11:20:38 AM    Final    MR ANGIO HEAD WO CONTRAST Result Date: 05/22/2024 CLINICAL DATA:  Follow-up examination for stroke. EXAM: MRA HEAD WITHOUT CONTRAST TECHNIQUE: Angiographic images of the Circle of Willis were acquired using MRA technique without intravenous contrast. COMPARISON:  Prior MRI from 05/21/2024 FINDINGS: Anterior circulation: Both ICAs are patent through the siphons without significant stenosis or other abnormality. A1 segments patent bilaterally. Normal anterior communicating artery complex. Anterior cerebral arteries patent without significant stenosis. No M1 stenosis or occlusion. Distal MCA branches perfused and symmetric. Posterior  circulation: Visualized V4 segments patent without stenosis. Right PICA patent at its origin. Left PICA not seen. Basilar patent without significant stenosis. Superior cerebral arteries patent bilaterally. Right PCA supplied via a robust right PCOM and hypoplastic right P1 segment. Fetal type origin of the left PCA. Both PCAs patent to their distal aspects without significant stenosis. Anatomic variants: None significant. Other: No intracranial aneurysm or other vascular malformation. Ventriculomegaly with a few scattered chronic infarcts, stable. IMPRESSION: Negative intracranial MRA. No large vessel occlusion, hemodynamically significant stenosis, or other acute vascular abnormality. No aneurysm. Electronically Signed   By: Morene Hoard M.D.   On: 05/22/2024 05:46   DG Abd Portable 1V-Small Bowel Obstruction Protocol-initial, 8 hr delay Result Date: 05/21/2024 CLINICAL DATA:  Small bowel obstruction. EXAM: PORTABLE ABDOMEN - 1 VIEW COMPARISON:  Radiograph dated 05/20/2024. FINDINGS: Enteric tube with tip in the proximal stomach. Injected contrast opacifies the stomach. No bowel dilatation. No free air. No acute osseous pathology. IMPRESSION: Enteric tube with tip in the proximal stomach. Electronically Signed   By: Vanetta Chou M.D.   On: 05/21/2024 17:45   MR Brain W and Wo Contrast Result Date: 05/21/2024 CLINICAL DATA:  Provided history: Neuro deficit, acute, stroke suspected. EXAM: MRI HEAD WITHOUT AND WITH CONTRAST TECHNIQUE: Multiplanar, multiecho pulse sequences of the brain and surrounding structures were obtained without and with intravenous contrast. CONTRAST:  7mL GADAVIST  GADOBUTROL  1 MMOL/ML IV SOLN COMPARISON:  Head CT 05/20/2024.  Brain MRI 12/31/2023. FINDINGS: Brain: Ventriculomegaly is unchanged from the prior brain MRI of 12/31/2023. This is at least partly due to cerebral atrophy. However, there is also acute callosal angle and crowding of the sulci at the level of the high  frontoparietal lobes, and these findings can be seen in the setting of normal pressure hydrocephalus (NPH). 2 mm acute infarct within the left frontal lobe centrum semiovale (series 5, image 87). Multifocal T2 FLAIR hyperintense signal abnormality within the cerebral white matter and pons, nonspecific but compatible with moderate-to-advanced chronic small vessel ischemic disease. Unchanged small chronic infarcts within the thalami, pons and right cerebellar hemisphere. Chronic microhemorrhages scattered within the supratentorial brain, similar to the prior MRI. No evidence of an intracranial mass No extra-axial fluid collection. No midline shift. No pathologic intracranial enhancement identified. Vascular: Maintained flow voids within the proximal large arterial vessels. Skull and upper cervical spine: No focal worrisome marrow lesion. Sinuses/Orbits: No mass or acute finding within the imaged orbits. Prior bilateral ocular lens replacement. No significant paranasal sinus disease. Other: Small-volume fluid within bilateral mastoid air cells. Impression #1 will be called to the ordering clinician or representative by the Radiologist Assistant, and communication documented in the PACS or [employer]. IMPRESSION: 1. 2 mm acute infarct within the left frontal lobe centrum semiovale. 2. Background parenchymal atrophy, chronic small vessel ischemic disease and chronic infarcts, as described. 3. Ventriculomegaly is unchanged  from the prior MRI of 12/31/2023. While this is at least partly due to cerebral atrophy, a component of normal pressure hydrocephalus is possible (in the appropriate clinical setting). 4. Small-volume fluid within bilateral mastoid air cells. Electronically Signed   By: Rockey Childs D.O.   On: 05/21/2024 13:32   EEG adult Result Date: 05/21/2024 Shelton Arlin KIDD, MD     05/21/2024  8:47 AM Patient Name: Jonathan Baird MRN: [id number] Epilepsy Attending: Arlin KIDD Shelton Referring  Physician/Provider: Merrianne Locus, MD Date: 05/21/2024 Duration: 24.55 mins Patient history: 89yo M with ams. EEG to evaluate for seizure Level of alertness: comatose AEDs during EEG study: LEV, Propofol  Technical aspects: This EEG study was done with scalp electrodes positioned according to the 10-20 International system of electrode placement. Electrical activity was reviewed with band pass filter of 1-70Hz , sensitivity of 7 uV/mm, display speed of 52mm/sec with a 60Hz  notched filter applied as appropriate. EEG data were recorded continuously and digitally stored.  Video monitoring was available and reviewed as appropriate. Description: EEG showed burst attenuation pattern with bursts of 5 to 6 Hz theta slowing lasting 2 to 3 seconds alternating with 1 to 2 seconds of generalized EEG attenuation.  Hyperventilation and photic stimulation were not performed.   ABNORMALITY - Burst attenuation, generalized IMPRESSION: This study is suggestive of profound diffuse encephalopathy.  No seizures or epileptiform discharges were seen throughout the recording. Arlin KIDD Shelton   DG Chest Portable 1 View Result Date: 05/21/2024 CLINICAL DATA:  Intubation confirmation EXAM: PORTABLE CHEST 1 VIEW COMPARISON:  05/20/2024 FINDINGS: Endotracheal tube is been placed with tip measuring 6.2 cm above the carina. An enteric tube was placed. Tip projects over the expected location of the EG junction with proximal side hole in the distal esophageal region. Advancement is suggested. Heart size and pulmonary vascularity are normal. Perihilar infiltrates similar to prior study. No pleural effusion or pneumothorax. Calcification of the aorta. IMPRESSION: Endotracheal tube with tip above the carina. Enteric tube tip projects over the lower esophageal region and advancement is suggested. Persistent perihilar infiltrates. Electronically Signed   By: Elsie Gravely M.D.   On: 05/21/2024 02:42   DG Abd Portable 1V-Small Bowel  Protocol-Position Verification Result Date: 05/20/2024 CLINICAL DATA:  NG tube placement confirmation EXAM: PORTABLE ABDOMEN - 1 VIEW COMPARISON:  05/20/2024 FINDINGS: Limited field of view for tube placement verification purposes. The enteric tube has been advanced with tip now projecting over the left upper quadrant consistent with location in the body of the stomach. Mild gaseous distention of the stomach. IMPRESSION: Enteric tube has been advanced with tip now projecting over the left upper quadrant consistent with location in the body of the stomach. Electronically Signed   By: Elsie Gravely M.D.   On: 05/20/2024 23:00   DG Abd Portable 1 View Result Date: 05/20/2024 CLINICAL DATA:  NG tube placement EXAM: PORTABLE ABDOMEN - 1 VIEW COMPARISON:  CT 05/20/2024 FINDINGS: Enteric tube tip folded back upon itself and directed cranially in the region of the distal esophagus. Heterogeneous airspace disease at the lung bases. Moderate air distension of the stomach IMPRESSION: Enteric tube tip folded back upon itself and directed cranially in the region of the distal esophagus. Recommend repositioning. Electronically Signed   By: Luke Bun M.D.   On: 05/20/2024 22:13   CT Head Wo Contrast Result Date: 05/20/2024 CLINICAL DATA:  Mental status changes, unknown cause. EXAM: CT HEAD WITHOUT CONTRAST TECHNIQUE: Contiguous axial images were obtained from the base of the  skull through the vertex without intravenous contrast. RADIATION DOSE REDUCTION: This exam was performed according to the departmental dose-optimization program which includes automated exposure control, adjustment of the mA and/or kV according to patient size and/or use of iterative reconstruction technique. COMPARISON:  Head CT 12/31/2023. FINDINGS: Brain: There is mild cerebellar volume loss, small chronic right superior cerebellar lacunar infarct. There is moderately advanced cerebral atrophy and atrophic ventriculomegaly with moderate to severe  small vessel disease of cerebral white matter. Chronic dystrophic calcification again noted in the frontal falx, senescent mineralization in the basal ganglia. No acute cortical based infarct, hemorrhage, mass or mass effect is seen. There is no midline shift.  Basal cisterns are patent. Vascular: There are calcifications in the distal vertebral arteries and heavily in the siphons. No hyperdense central vessels. Skull: Negative for fractures or focal lesions. Sinuses/Orbits: No acute findings. Old lens replacements with otherwise negative orbits. Other: None. IMPRESSION: 1. No acute intracranial CT findings or interval changes. 2. Atrophy and small-vessel disease with atrophic ventriculomegaly. Stable exam. 3. Atherosclerosis. Electronically Signed   By: Francis Quam M.D.   On: 05/20/2024 21:49   CT CHEST ABDOMEN PELVIS WO CONTRAST Addendum Date: 05/20/2024 ADDENDUM REPORT: 05/20/2024 21:28 ADDENDUM: Critical Value/emergent results were called by telephone at the time of interpretation on 05/20/2024 at 9:27 pm to provider BERNARDINO FIREMAN , who verbally acknowledged these results. Electronically Signed   By: Leita Birmingham M.D.   On: 05/20/2024 21:28   Result Date: 05/20/2024 CLINICAL DATA:  Sepsis. EXAM: CT CHEST, ABDOMEN AND PELVIS WITHOUT CONTRAST TECHNIQUE: Multidetector CT imaging of the chest, abdomen and pelvis was performed following the standard protocol without IV contrast. RADIATION DOSE REDUCTION: This exam was performed according to the departmental dose-optimization program which includes automated exposure control, adjustment of the mA and/or kV according to patient size and/or use of iterative reconstruction technique. COMPARISON:  09/17/2007. FINDINGS: CT CHEST FINDINGS Cardiovascular: The heart is normal in size and there is a trace pericardial effusion. Three-vessel coronary artery calcifications are noted. There is atherosclerotic calcification of the aorta without evidence of aneurysm. The pulmonary  trunk is normal in caliber. Mediastinum/Nodes: No mediastinal or axillary lymphadenopathy. The thyroid  gland and trachea are within normal limits. Esophagus is mildly distended with fluid. Lungs/Pleura: Scattered airspace opacities are noted in the right lung with consolidation in the right upper and lower lobes. There is consolidation in the left lower lobe with mild airspace disease in the left upper lobe. There is a 3 mm nodule in the left upper lobe, axial image 38. No effusion or pneumothorax is seen. Musculoskeletal: Gynecomastia is noted. Degenerative changes are present in the thoracic spine. No acute osseous abnormality. CT ABDOMEN PELVIS FINDINGS Hepatobiliary: No focal liver abnormality is seen. No gallstones, gallbladder wall thickening, or biliary dilatation. Pancreas: There is a vague hypodensity in the head of the pancreas measuring 1.8 x 1.2 cm. No pancreatic ductal dilatation or surrounding inflammatory changes. Spleen: Normal in size without focal abnormality. Adrenals/Urinary Tract: The adrenal glands are within normal limits. No renal calculus or hydronephrosis bilaterally. A Foley catheter is present in the urinary bladder. Stomach/Bowel: The stomach is distended with fluid. Multiple dilated loops of small bowel are present in the abdomen measuring up to 4.4 cm in diameter. A suspected transition point is seen in the mid pelvis, sagittal image 71, coronal image 74. There is a left inguinal hernia containing nonobstructed small bowel. No free air or pneumatosis is seen. A moderate amount of retained stool is noted  in the rectum. Surgical changes are present at the cecum and the appendix is not seen. Vascular/Lymphatic: Aortic atherosclerosis. No enlarged abdominal or pelvic lymph nodes. Reproductive: The prostate gland is enlarged. Other: No abdominopelvic ascites. There is a left inguinal hernia containing nonobstructed small bowel. Musculoskeletal: Degenerative changes are present in the lumbar  spine. No acute osseous abnormality is seen. IMPRESSION: 1. Multiple dilated loops of small bowel in the abdomen with a suspected transition point in the mid pelvis, concerning for small bowel obstruction. Surgical consultation is recommended. 2. Scattered airspace disease in the lungs bilaterally with consolidation in the right upper and lower lobes and left lower lobe, suggesting pneumonia. 3. Vague hypodensity in the head of the pancreas measuring 1.8 cm. Nonemergent MRI is recommended for further characterization. 4. Multi-vessel coronary artery calcifications. 5. Aortic atherosclerosis. Electronically Signed: By: Leita Birmingham M.D. On: 05/20/2024 21:15   DG Chest Port 1 View Result Date: 05/20/2024 CLINICAL DATA:  Possible sepsis.  Respiratory distress. EXAM: PORTABLE CHEST 1 VIEW COMPARISON:  12/31/2023. FINDINGS: The heart size and mediastinal contours are within normal limits. There is atherosclerotic calcification of the aorta. Scattered airspace disease is noted in the perihilar region on the right and at the left lung base. No effusion or pneumothorax is seen. No acute osseous abnormality. IMPRESSION: Scattered airspace disease in the perihilar region on the right and at the left lung base, concerning for pneumonia. Electronically Signed   By: Leita Birmingham M.D.   On: 05/20/2024 19:58     PHYSICAL EXAM  Temp:  [96.3 F (35.7 C)-99.7 F (37.6 C)] 97.3 F (36.3 C) (07/12 1245) Pulse Rate:  [56-113] 66 (07/12 1245) Resp:  [9-24] 14 (07/12 1245) BP: (82-200)/(21-116) 152/67 (07/12 1245) SpO2:  [100 %] 100 % (07/12 1245) FiO2 (%):  [40 %] 40 % (07/12 1113) Weight:  [62 kg] 62 kg (07/12 0107)  General - Well nourished, well developed, intubated off sedation.  Ophthalmologic - fundi not visualized due to noncooperation.  Cardiovascular - Regular rate and rhythm.  Neuro - intubated off sedation, eyes closed, not following commands. With forced eye opening, eyes in mid position, not  blinking to visual threat, doll's eyes absent, not tracking, pupil equal size, sluggish to light. Corneal reflex weakly present bilaterally, gag and cough weakly present. Breathing over the vent.  Facial symmetry not able to test due to ET tube.  Tongue protrusion not cooperative. On pain stimulation, slight withdraw in all extremities. Sensation, coordination and gait not tested.     ASSESSMENT/PLAN Mr. Jonathan Baird is a 89 y.o. male with history of colon cancer status post hemicolectomy, hypertension, diabetes, CKD, dementia admitted for respite failure and seizure-like activity. No TNK given due to outside window.    Metabolic encephalopathy Seizure like activity Intubated and less responsive Off propofol  Per Dr. Merrianne earlier note, seizure like activity was not felt to be true seizure S/p ativan  and keppra  load MRI without clear seizure focus EEG c/w toxic/metabolic state, but was on propofol  when it was done Now off propofol , pt more tremulous and shivering vs. Semi-rhythmic jerking, upper > lower Pt also has hypothermia Put on bear hugger Long-term overnight EEG monitoring.  No definite seizure activity.  Periodic discharges with triphasic morphology and generalized slowing.  Stroke, incidental finding: left MCA/ACA punctate infarct, likely subacute, etiology small vessel disease CT head no acute finding MRI  2 mm acute infarct within the left frontal lobe centrum semiovale. MRA unremarkable  Carotid Doppler   bilateral no  significant carotid stenosis 2D Echo  EF 60-65% LDL 26 HgbA1c 7.6 Heparin  subq for VTE prophylaxis aspirin  81 mg daily prior to admission, now on home aspirin  81 mg daily.  Ongoing aggressive stroke risk factor management Therapy recommendations:  pending Disposition:  pending  Respiratory failure EMS and ED O2 sat 64% Intubated  Vent management per CCM Off propofol   Diabetes with hyperglycemia HgbA1c pending goal < 7.0 Uncontrolled High  glucose on presentation s/p insulin  CBG monitoring SSI DM education and close PCP follow up  Small bowel obstruction  Sepsis  Leukocytosis  LA high with lactic acidosis Pan CT concerning for small bowel obstruction Surgery on board On NGT suctioning On zosyn  and doxycycline   Hypothermia - on bear hugger  WBC 11.4--15.8--10.6  Hypertension Unstable, BP fluctuate Avoid low BP Long term BP goal normotensive  Hyperlipidemia Home meds:  crestor  10  LDL 26, goal < 70 Now off statin given low LDL and NPO status  Other Stroke Risk Factors Advanced age  Other Active Problems Colon cancer s/p hemcolectomy Dementia  CKD IIIb, Cre 3.45--2.62--3.39  Hospital day # 2  Patient remains critically ill requiring intubation, sedation and respiratory support from medical issues.  MRI shows tiny punctate left subcortical infarct which is unlikely to cause seizures.  And her extremity jerking's are likely due to metabolic encephalopathy rather than true seizures.  Long-term EEG monitoring shows no ongoing seizure activity.  Discontinue long-term EEG monitoring.  Continue Keppra  for now but may discontinue prior to discharge when patient's condition improves..  No family available at the bedside.  Discussed with Dr. Neda critical care medicine.  Stroke team will sign off.  Kindly call for questions. This patient is critically ill and at significant risk of neurological worsening, death and care requires constant monitoring of vital signs, hemodynamics,respiratory and cardiac monitoring, extensive review of multiple databases, frequent neurological assessment, discussion with family, other specialists and medical decision making of high complexity.I have made any additions or clarifications directly to the above note.This critical care time does not reflect procedure time, or teaching time or supervisory time of PA/NP/Med Resident etc but could involve care discussion time.  I spent 30 minutes of  neurocritical care time  in the care of  this patient.       Eather Popp, MD Stroke Neurology 05/23/2024 1:19 PM    To contact Stroke Continuity provider, please refer to [email or web address]. After hours, contact General Neurology

## 2024-05-23 NOTE — Progress Notes (Signed)
 PT Cancellation Note  Patient Details Name: Jonathan Baird MRN: [id number] DOB: 1933/09/06   Cancelled Treatment:    Reason Eval/Treat Not Completed: Medical issues which prohibited therapy.  Hold per RN.  Pt continues to be sedated on the vent.  PT evaluation to continue efforts when pt is medically appropriate to participate.    [name] 05/23/2024, 1:53 PM

## 2024-05-23 NOTE — Plan of Care (Signed)
  Problem: Activity: Goal: Ability to tolerate increased activity will improve Outcome: Not Progressing   Problem: Respiratory: Goal: Ability to maintain a clear airway and adequate ventilation will improve Outcome: Not Progressing   Problem: Role Relationship: Goal: Method of communication will improve Outcome: Not Progressing   Problem: Education: Goal: Knowledge of General Education information will improve Description: Including pain rating scale, medication(s)/side effects and non-pharmacologic comfort measures Outcome: Not Progressing   Problem: Health Behavior/Discharge Planning: Goal: Ability to manage health-related needs will improve Outcome: Not Progressing   Problem: Clinical Measurements: Goal: Ability to maintain clinical measurements within normal limits will improve Outcome: Not Progressing Goal: Diagnostic test results will improve Outcome: Not Progressing Goal: Respiratory complications will improve Outcome: Not Progressing   Problem: Activity: Goal: Risk for activity intolerance will decrease Outcome: Not Progressing   Problem: Nutrition: Goal: Adequate nutrition will be maintained Outcome: Not Progressing   Problem: Elimination: Goal: Will not experience complications related to bowel motility Outcome: Not Progressing   Problem: Skin Integrity: Goal: Risk for impaired skin integrity will decrease Outcome: Not Progressing   Problem: Education: Goal: Knowledge of disease or condition will improve Outcome: Not Progressing Goal: Knowledge of secondary prevention will improve (MUST DOCUMENT ALL) Outcome: Not Progressing Goal: Knowledge of patient specific risk factors will improve (DELETE if not current risk factor) Outcome: Not Progressing

## 2024-05-24 DIAGNOSIS — J690 Pneumonitis due to inhalation of food and vomit: Secondary | ICD-10-CM | POA: Diagnosis not present

## 2024-05-24 DIAGNOSIS — K56609 Unspecified intestinal obstruction, unspecified as to partial versus complete obstruction: Secondary | ICD-10-CM | POA: Diagnosis not present

## 2024-05-24 DIAGNOSIS — Z515 Encounter for palliative care: Secondary | ICD-10-CM | POA: Diagnosis not present

## 2024-05-24 DIAGNOSIS — J9601 Acute respiratory failure with hypoxia: Secondary | ICD-10-CM | POA: Diagnosis not present

## 2024-05-24 DIAGNOSIS — Z7189 Other specified counseling: Secondary | ICD-10-CM

## 2024-05-24 DIAGNOSIS — G9341 Metabolic encephalopathy: Secondary | ICD-10-CM | POA: Diagnosis not present

## 2024-05-24 LAB — BASIC METABOLIC PANEL WITH GFR
Anion gap: 9 (ref 5–15)
Anion gap: 10 (ref 5–15)
BUN: 93 mg/dL — ABNORMAL HIGH (ref 8–23)
BUN: 85 mg/dL — ABNORMAL HIGH (ref 8–23)
CO2: 24 mmol/L (ref 22–32)
CO2: 21 mmol/L — ABNORMAL LOW (ref 22–32)
Calcium: 9.6 mg/dL (ref 8.9–10.3)
Calcium: 9.3 mg/dL (ref 8.9–10.3)
Chloride: 121 mmol/L — ABNORMAL HIGH (ref 98–111)
Chloride: 125 mmol/L — ABNORMAL HIGH (ref 98–111)
Creatinine, Ser: 3.71 mg/dL — ABNORMAL HIGH (ref 0.61–1.24)
Creatinine, Ser: 3.62 mg/dL — ABNORMAL HIGH (ref 0.61–1.24)
GFR, Estimated: 15 mL/min — ABNORMAL LOW (ref >60)
GFR, Estimated: 15 mL/min — ABNORMAL LOW (ref >60)
Glucose, Bld: 169 mg/dL — ABNORMAL HIGH (ref 70–99)
Glucose, Bld: 184 mg/dL — ABNORMAL HIGH (ref 70–99)
Potassium: 4.3 mmol/L (ref 3.5–5.1)
Potassium: 3.9 mmol/L (ref 3.5–5.1)
Sodium: 154 mmol/L — ABNORMAL HIGH (ref 135–145)
Sodium: 156 mmol/L — ABNORMAL HIGH (ref 135–145)

## 2024-05-24 LAB — CBC WITH DIFFERENTIAL/PLATELET
Abs Immature Granulocytes: 0.24 K/uL — ABNORMAL HIGH (ref 0.00–0.07)
Basophils Absolute: 0 K/uL (ref 0.0–0.1)
Basophils Relative: 0 %
Eosinophils Absolute: 0 K/uL (ref 0.0–0.5)
Eosinophils Relative: 0 %
HCT: 25.1 % — ABNORMAL LOW (ref 39.0–52.0)
Hemoglobin: 8.3 g/dL — ABNORMAL LOW (ref 13.0–17.0)
Immature Granulocytes: 2 %
Lymphocytes Relative: 8 %
Lymphs Abs: 1.3 K/uL (ref 0.7–4.0)
MCH: 26.3 pg (ref 26.0–34.0)
MCHC: 33.1 g/dL (ref 30.0–36.0)
MCV: 79.4 fL — ABNORMAL LOW (ref 80.0–100.0)
Monocytes Absolute: 0.8 K/uL (ref 0.1–1.0)
Monocytes Relative: 5 %
Neutro Abs: 13.8 K/uL — ABNORMAL HIGH (ref 1.7–7.7)
Neutrophils Relative %: 85 %
Platelets: 221 K/uL (ref 150–400)
RBC: 3.16 MIL/uL — ABNORMAL LOW (ref 4.22–5.81)
RDW: 15.9 % — ABNORMAL HIGH (ref 11.5–15.5)
WBC: 16.2 K/uL — ABNORMAL HIGH (ref 4.0–10.5)
nRBC: 0 % (ref 0.0–0.2)

## 2024-05-24 LAB — GLUCOSE, CAPILLARY
Glucose-Capillary: 159 mg/dL — ABNORMAL HIGH (ref 70–99)
Glucose-Capillary: 163 mg/dL — ABNORMAL HIGH (ref 70–99)
Glucose-Capillary: 159 mg/dL — ABNORMAL HIGH (ref 70–99)
Glucose-Capillary: 158 mg/dL — ABNORMAL HIGH (ref 70–99)
Glucose-Capillary: 145 mg/dL — ABNORMAL HIGH (ref 70–99)

## 2024-05-24 MED ORDER — LACTATED RINGERS IV SOLN: INTRAVENOUS | Status: DC

## 2024-05-24 MED ORDER — ORAL CARE MOUTH RINSE: 15.0000 mL | Freq: Four times a day (QID) | OROMUCOSAL | Status: DC

## 2024-05-24 MED ORDER — FREE WATER
200.0000 mL | Status: DC
  Administered 2024-05-24 – 2024-05-31 (×41): 200 mL

## 2024-05-24 NOTE — Progress Notes (Signed)
 OT Cancellation Note  Patient Details Name: BOSTON COOKSON MRN: [id number] DOB: Nov 02, 1933   Cancelled Treatment:    Reason Eval/Treat Not Completed: Patient not medically ready Pt remains intubated and sedated. Will sign off and await new OT eval orders as appropriate.  Mliss Fish 05/24/2024, 6:56 AM

## 2024-05-24 NOTE — Consult Note (Signed)
 Palliative Medicine Inpatient Consult Note  Consulting Provider:  Whitfield Toribio BROCKS, RN   Reason for consult:   Palliative Care Consult Services Palliative Medicine Consult  Reason for Consult? 22M with AKI on CKD, aspiration, SBO, and strokes. consult for goals of care discussion. Call daughter to set up GOC meeting.   05/24/2024  HPI:  Per intake H&P --> 89 year old man who presented to [facility] ED 7/10 for respiratory distress. PMHx significant for HTN, HLD, T2DM, stage IV CKD, PVD, R ICA stenosis, colon CA s/p hemicolectomy, dementia. The Palliative care team as been asked to support additional goals of care conversations.   Clinical Assessment/Goals of Care:  *Please note that this is a verbal dictation therefore any spelling or grammatical errors are due to the Dragon Medical One system interpretation.  I have reviewed medical records including EPIC notes, labs and imaging, received report from bedside RN, assessed the patient who is lying in bed chronically sedated.    I met with Jonathan Baird's family wife, Jonathan Baird two daughter, and son in law to further discuss diagnosis prognosis, GOC, EOL wishes, disposition and options.   I introduced Palliative Medicine as specialized medical care for people living with serious illness. It focuses on providing relief from the symptoms and stress of a serious illness. The goal is to improve quality of life for both the patient and the family.  Medical History Review and Understanding:  A review of Jonathan Baird's past medical history inclusive of HLD, HTN, dementia, T2DM, Stg IV CKD, colon CA, and PAD was completed.   Social History:  Jonathan Baird is from [place], [place] . He has been married to his wife for > 51 years. They share two daughters, four grandchildren, and six great grandchildren. He formerly worked as a Chief Strategy Officer. He is a man of strong faith practicing within the [employer] denomination.   Functional and Nutritional State:  Prior to  hospitalization Jonathan Baird had gotten more week over the prior three months and have become dependent for all iadls and badls with the exception of feeding. Over the last two weeks however he had little desire to eat but still did drink supplemental shakes. He has lost weight recently.   Advance Directives:    A detailed discussion was had today regarding advanced directives.  Patients wife is his surrogate Management consultant.   Code Status:  Concepts specific to code status, artifical feeding and hydration, continued IV antibiotics and rehospitalization was had.  The difference between a aggressive medical intervention path  and a palliative comfort care path for this patient at this time was had.   Patients wife shares that Jonathan Baird never wanted aggressive interventions or tubes. She shares that he had told her he would not want dialysis or to live in a state of dependence.   We agreed to transition Jonathan Baird to a DNR - no compressions code status.   Discussion:  Dr. Neda came to speak with family. He shared that patients is critically ill and has not been able to awaken despite not having any sedation. He shares the concern that Jonathan Baird's kidneys are progressively worsening. He mentioned topics like dialysis, tracheostomy, and peg tub if a more agressive path is chosen and Jonathan Baird is unable to wean from the ventilator. He shared the importance of considering what Jonathan Baird would want from a quality of life perspective. He shares the hope that we can get Jonathan Baird off the ventilator but impresses upon the patients family the importance of conversations if this is not a possibility.  I then spoke to patients family about the best case and worst case scenarios. We reviewed even if improved that Jonathan Baird would be even more dependent on patients family than he had been prior. We reviewed what the alternative to aggressive measures are comfort measures. We talked about transition to comfort measures in house and what that  would entail inclusive of medications to control pain, dyspnea, agitation, nausea, itching, and hiccups.  We discussed stopping all uneccessary measures such as cardiac monitoring, blood draws, needle sticks, and frequent vital signs.   Patients family shares that the information shared is a lot to take in. I shared that it's important whatever decisions are make reflect what Jonathan Baird would want if he were able to speak for himself. They agreed on this.   I shared allowing time and space for Jonathan Baird's family to speak together to further strengthen decisions from here.   As of presently the plan will be to continue present measures though patients family knows they may change course at any point if they choose to.   Discussed the importance of continued conversation with family and their  medical providers regarding overall plan of care and treatment options, ensuring decisions are within the context of the patients values and GOCs.  Decision Maker: Jonathan Baird (Spouse): [phone] (Mobile)   SUMMARY OF RECOMMENDATIONS   DNR - no compressions  Open and honest conversations held with Jonathan Baird's family regarding his present clinical condition  Appreciate CCM updating family  Discussed the what if's should Jonathan Baird not improve  Gently broached the topic of comfort measures  Patients family would like time and space to process the information provided  Ongoing PMT support  Code Status/Advance Care Planning: DNR - no compressions  Palliative Prophylaxis:  Aspiration, Bowel Regimen, Delirium Protocol, Frequent Pain Assessment, Oral Care, Palliative Wound Care, and Turn Reposition  Additional Recommendations (Limitations, Scope, Preferences): Continue current care  Psycho-social/Spiritual:  Desire for further Chaplaincy support: Yes Additional Recommendations: Education on acute illness   Prognosis: Quite limited overall.   Discharge Planning: To be determined.   Vitals:    05/24/24 0324 05/24/24 0400  BP:  (!) 159/47  Pulse:  60  Resp:  16  Temp:  (!) 97.3 F (36.3 C)  SpO2: 99% 100%    Intake/Output Summary (Last 24 hours) at 05/24/2024 9277 Last data filed at 05/24/2024 0421 Gross per 24 hour  Intake 798.98 ml  Output 1250 ml  Net -451.02 ml   Last Weight  Most recent update: 05/24/2024  5:10 AM    Weight  61.2 kg (134 lb 14.7 oz)            Gen:  Elderly AA M chronically ill appearing HEENT: NGT, dry mucous membranes CV: Regular rate and rhythm  PULM:  On mechanical ventilator ABD: soft/nontender  EXT: No edema  Neuro: Somnolent  PPS: 10%   This conversation/these recommendations were discussed with patient primary care team, Dr. Neda ______________________________________________________ [name] [facility] Palliative Medicine Team Team Cell Phone: [phone] Please utilize secure chat with additional questions, if there is no response within 30 minutes please call the above phone number  Total Time: 90 Billing based on MDM: High  Palliative Medicine Team providers are available by phone from 7am to 7pm daily and can be reached through the team cell phone.  Should this patient require assistance outside of these hours, please call the patient's attending physician.

## 2024-05-24 NOTE — Progress Notes (Signed)
 NAME:  Jonathan Baird, MRN:  [id number], DOB:  12/10/1932, LOS: 3 ADMISSION DATE:  05/20/2024 History of Present Illness:  89 year old man who presented to [facility] ED 7/10 for respiratory distress. PMHx significant for HTN, HLD, T2DM, stage IV CKD, PVD, R ICA stenosis, colon CA s/p hemicolectomy, dementia. Recent brief admission 2/17-2/18 for progressive weakness and urinary frequency. Recently treated presumed UTI.   Patient initially presented to ED from home for respiratory distress. Reportedly in his USOH 7/8PM, but developed SOB and wet cough 7/9. On ED arrival, patient was afebrile with temp 99.63F, HR 130, BP 150/90, SpO2 64% on RA, improved to 89% with nebulizer and 4LNC. DuoNebs, Solumedrol and Mg administered. GCS 10. CBG 468. Labs were notable for WBC 11.4, Hgb 11.3, Plt 320. Na 143, K 3.9, CO2 16, BUN/Cr 108/3.76 (baseline Cr 2.8-2.9). LFTs WNL. INR 1.2. Trop 37 > 45. BNP 22.1. RVP negative. UA > 500 glucose, otherwise unremarkable. Serum Osm 360. B-hydroxybutyrate 0.25. LA 7.8 > 9.6 > 7.9. CXR with scattered airspace disease (perihilar) c/f PNA; CT Head NAICA. CT Chest/A/P demonstrated multiple loops of SB with suspected transition point mid-pelvis, scattered bilateral airspace disease in the lungs c/w PNA, vague pancreatic head density. Insulin  gtt was started for hyperglycemia and broad-spectrum antibiotics initiated (Vanc/Zosyn ).    While awaiting bed placement in ED, patient had a witnessed seizure lasting about 2 minutes with L eye deviation. Neuro was consulted. Spot EEG demonstrated diffuse slowing without electrographic seizures. Patient remained particularly somnolent and was poorly protecting his airway with gurgling respirations and decision was made to intubate.   PCCM consulted for ICU admission.  Pertinent  Medical History  CAD, CKD stage IV, colon cancer s/p hemicolectomy, dementia, DM, HLD, HTN, PVD  Significant Hospital Events: Including procedures, antibiotic start and stop  dates in addition to other pertinent events   7/9 - Presented to ED with AMS, SOB. 7/10 - PCCM consulted for airway protection, intubated in ED. 7/11-sedation stopped for WUA 7/13-remains off sedation  Antibiotics Doxycycline  day 4 Zosyn  day 4  Interim History / Subjective:  Responsive to verbal stimulation Not able to follow commands  Objective    Blood pressure (!) 131/50, pulse 63, temperature (!) 95.4 F (35.2 C), resp. rate 15, height 5' 10 (1.778 m), weight 61.2 kg, SpO2 100%.    Vent Mode: PRVC FiO2 (%):  [30 %-40 %] 30 % Set Rate:  [16 bmp] 16 bmp Vt Set:  [580 mL] 580 mL PEEP:  [5 cmH20] 5 cmH20 Plateau Pressure:  [13 cmH20-14 cmH20] 14 cmH20   Intake/Output Summary (Last 24 hours) at 05/24/2024 0947 Last data filed at 05/24/2024 0913 Gross per 24 hour  Intake 1134.65 ml  Output 1375 ml  Net -240.35 ml   Filed Weights   05/22/24 0400 05/23/24 0107 05/24/24 0500  Weight: 62 kg 62 kg 61.2 kg    Examination: General: Elderly, frail HENT: Dry mucous membranes, normocephalic, atraumatic Lungs: Clear breath sounds Cardiovascular: S1-S2 appreciated Abdomen: Bowel sounds appreciated Extremities: No clubbing, no edema Neuro: Responds to voice, not able to follow commands  I reviewed last 24 h vitals and pain scores, last 48 h intake and output, last 24 h labs and trends, and last 24 h imaging results. Sodium 154  Resolved problem list  None Assessment and Plan   Metabolic encephalopathy Background dementia Acute left frontal lobe infarct-2 mm Concern for seizures - Was loaded with Keppra , did receive Ativan  - Ongoing EEG did not reveal seizures, was discontinued 05/23/2024 -  Currently off sedation - Not very interactive  Subacute stroke, 2 mm in the left frontal lobe -MRA negative - Remains on aspirin  - Appreciate neurology follow-up - Neuroprotective measures  Acute hypoxemic respiratory failure Aspiration pneumonia -Continue broad-spectrum  antibiotics - Continue mechanical ventilation -Target TVol 6-8cc/kgIBW -Target Plateau Pressure < 30cm H20 -Target driving pressure less than 15 cm of water  -Target PaO2 55-65: titrate PEEP/FiO2 per protocol -Ventilator associated pneumonia prevention protocol -continue bronchodilators  Acute kidney injury - Avoid nephrotoxic medications - Continue to maintain renal perfusion - LR at 100 cc an hour  Hypernatremia - Add free water   Nutrition - Continue tube feeds  History of colon cancer s/p hemicolectomy  Mental status precludes extubation  Best Practice (right click and Reselect all SmartList Selections daily)   Diet/type: tubefeeds DVT prophylaxis prophylactic heparin   Pressure ulcer(s): present on admission  GI prophylaxis: PPI Lines: N/A Foley:  Yes, and it is still needed Code Status:  full code Last date of multidisciplinary goals of care discussion [pending discussion today]  Labs   CBC: Recent Labs  Lab 05/20/24 1957 05/21/24 0317 05/21/24 0444 05/22/24 0555 05/23/24 0419 05/24/24 0457  WBC 11.4*  --  15.8* 10.6* 15.6* 16.2*  NEUTROABS 10.5*  --   --   --  13.5* 13.8*  HGB 11.3* 8.8* 8.2* 8.6* 9.0* 8.3*  HCT 34.8* 26.0* 24.8* 25.1* 26.8* 25.1*  MCV 79.8*  --  80.0 76.5* 78.4* 79.4*  PLT 320  --  198 219 218 221    Basic Metabolic Panel: Recent Labs  Lab 05/20/24 1804 05/20/24 1830 05/20/24 2342 05/21/24 0317 05/21/24 0444 05/22/24 0555 05/23/24 0419 05/24/24 0457  NA 143   < > 145 142 135 146* 149* 154*  K 3.9   < > 3.8 3.8 3.7 4.0 4.2 4.3  CL 103   < > 108  --  104 112* 115* 121*  CO2 16*  --  19*  --  19* 23 28 24   GLUCOSE 411*   < > 140*  --  182* 103* 130* 169*  BUN 108*   < > 104*  --  87* 101* 100* 93*  CREATININE 3.76*   < > 3.45*  --  2.62* 3.39* 3.66* 3.71*  CALCIUM  10.4*  --  9.6  --  8.0* 9.3 9.3 9.6  MG 3.6*  --   --   --  2.2  --   --   --   PHOS  --   --   --   --  3.6  --   --   --    < > = values in this interval not  displayed.   GFR: Estimated Creatinine Clearance: 11.9 mL/min (A) (by C-G formula based on SCr of 3.71 mg/dL (H)). Recent Labs  Lab 05/20/24 2232 05/21/24 0119 05/21/24 0444 05/22/24 0555 05/22/24 1113 05/23/24 0419 05/24/24 0457  WBC  --   --  15.8* 10.6*  --  15.6* 16.2*  LATICACIDVEN 7.9* 5.4* 3.1*  --  2.2*  --   --     Liver Function Tests: Recent Labs  Lab 05/20/24 1804 05/21/24 0444 05/22/24 0555  AST 24 14* 14*  ALT 11 8 10   ALKPHOS 82 47 57  BILITOT 0.5 2.8* 0.6  PROT 7.1 4.5* 5.3*  ALBUMIN 3.0* 1.9* 2.1*   No results for input(s): LIPASE, AMYLASE in the last 168 hours. Recent Labs  Lab 05/21/24 1224  AMMONIA <13    ABG    Component Value  Date/Time   PHART 7.406 05/21/2024 0317   PCO2ART 35.2 05/21/2024 0317   PO2ART 431 (H) 05/21/2024 0317   HCO3 22.0 05/21/2024 0317   TCO2 23 05/21/2024 0317   ACIDBASEDEF 2.0 05/21/2024 0317   O2SAT 100 05/21/2024 0317     Coagulation Profile: Recent Labs  Lab 05/20/24 1804  INR 1.2    Cardiac Enzymes: No results for input(s): CKTOTAL, CKMB, CKMBINDEX, TROPONINI in the last 168 hours.  HbA1C: Hgb A1c MFr Bld  Date/Time Value Ref Range Status  05/21/2024 04:44 AM 7.6 (H) 4.8 - 5.6 % Final    Comment:    (NOTE)         Prediabetes: 5.7 - 6.4         Diabetes: >6.4         Glycemic control for adults with diabetes: <7.0   10/20/2023 01:25 AM 8.2 (H) 4.8 - 5.6 % Final    Comment:    (NOTE)         Prediabetes: 5.7 - 6.4         Diabetes: >6.4         Glycemic control for adults with diabetes: <7.0     CBG: Recent Labs  Lab 05/23/24 1511 05/23/24 1949 05/23/24 2308 05/24/24 0325 05/24/24 0745  GLUCAP 200* 169* 150* 159* 163*    Review of Systems:   As above  Past Medical History:  He,  has a past medical history of Carotid artery stenosis, CKD (chronic kidney disease), stage IV (HCC), Colon cancer (HCC) (10/12/2004), Dementia (HCC), Diabetes mellitus, HLD (hyperlipidemia),  HTN (hypertension), and Peripheral vascular disease (HCC).   Surgical History:   Past Surgical History:  Procedure Laterality Date   hemocolectomy  11/13/2003     Social History:   reports that he has never smoked. He has never used smokeless tobacco. He reports that he does not drink alcohol and does not use drugs.   Family History:  His family history includes Cancer in his mother and sister; Diabetes in his father.   Allergies No Known Allergies   The patient is critically ill with multiple organ systems failure and requires high complexity decision making for assessment and support, frequent evaluation and titration of therapies, application of advanced monitoring technologies and extensive interpretation of multiple databases. Critical Care Time devoted to patient care services described in this note independent of APP/resident time (if applicable)  is 35 minutes.   Jennet Epley MD  Pulmonary Critical Care Personal pager: See Amion If unanswered, please page CCM On-call: #[phone]

## 2024-05-24 NOTE — Plan of Care (Signed)
  Problem: Clinical Measurements: Goal: Ability to maintain clinical measurements within normal limits will improve Outcome: Progressing Goal: Respiratory complications will improve Outcome: Progressing   Problem: Nutrition: Goal: Adequate nutrition will be maintained Outcome: Progressing   Problem: Elimination: Goal: Will not experience complications related to bowel motility Outcome: Progressing

## 2024-05-24 NOTE — Plan of Care (Signed)
  Problem: Activity: Goal: Ability to tolerate increased activity will improve Outcome: Progressing   Problem: Respiratory: Goal: Ability to maintain a clear airway and adequate ventilation will improve Outcome: Progressing   Problem: Role Relationship: Goal: Method of communication will improve Outcome: Progressing   Problem: Education: Goal: Knowledge of General Education information will improve Description: Including pain rating scale, medication(s)/side effects and non-pharmacologic comfort measures Outcome: Progressing   Problem: Health Behavior/Discharge Planning: Goal: Ability to manage health-related needs will improve Outcome: Progressing   Problem: Clinical Measurements: Goal: Ability to maintain clinical measurements within normal limits will improve Outcome: Progressing Goal: Diagnostic test results will improve Outcome: Progressing Goal: Respiratory complications will improve Outcome: Progressing   Problem: Activity: Goal: Risk for activity intolerance will decrease Outcome: Progressing   Problem: Nutrition: Goal: Adequate nutrition will be maintained Outcome: Progressing   Problem: Elimination: Goal: Will not experience complications related to bowel motility Outcome: Progressing   Problem: Skin Integrity: Goal: Risk for impaired skin integrity will decrease Outcome: Progressing   Problem: Education: Goal: Knowledge of disease or condition will improve Outcome: Progressing Goal: Knowledge of secondary prevention will improve (MUST DOCUMENT ALL) Outcome: Progressing Goal: Knowledge of patient specific risk factors will improve (DELETE if not current risk factor) Outcome: Progressing   Problem: Ischemic Stroke/TIA Tissue Perfusion: Goal: Complications of ischemic stroke/TIA will be minimized Outcome: Progressing   Problem: Coping: Goal: Will verbalize positive feelings about self Outcome: Progressing Goal: Will identify appropriate support  needs Outcome: Progressing   Problem: Health Behavior/Discharge Planning: Goal: Ability to manage health-related needs will improve Outcome: Progressing Goal: Goals will be collaboratively established with patient/family Outcome: Progressing   Problem: Self-Care: Goal: Ability to participate in self-care as condition permits will improve Outcome: Progressing Goal: Verbalization of feelings and concerns over difficulty with self-care will improve Outcome: Progressing Goal: Ability to communicate needs accurately will improve Outcome: Progressing   Problem: Nutrition: Goal: Risk of aspiration will decrease Outcome: Progressing Goal: Dietary intake will improve Outcome: Progressing   Problem: Safety: Goal: Non-violent Restraint(s) Outcome: Progressing

## 2024-05-24 NOTE — Plan of Care (Signed)
  Problem: Activity: Goal: Ability to tolerate increased activity will improve Outcome: Not Progressing   

## 2024-05-25 DIAGNOSIS — N179 Acute kidney failure, unspecified: Secondary | ICD-10-CM | POA: Diagnosis not present

## 2024-05-25 DIAGNOSIS — Z515 Encounter for palliative care: Secondary | ICD-10-CM | POA: Diagnosis not present

## 2024-05-25 DIAGNOSIS — K56609 Unspecified intestinal obstruction, unspecified as to partial versus complete obstruction: Secondary | ICD-10-CM | POA: Diagnosis not present

## 2024-05-25 DIAGNOSIS — Z7189 Other specified counseling: Secondary | ICD-10-CM | POA: Diagnosis not present

## 2024-05-25 DIAGNOSIS — J9601 Acute respiratory failure with hypoxia: Secondary | ICD-10-CM | POA: Diagnosis not present

## 2024-05-25 DIAGNOSIS — R4182 Altered mental status, unspecified: Secondary | ICD-10-CM | POA: Diagnosis not present

## 2024-05-25 LAB — BASIC METABOLIC PANEL WITH GFR
Anion gap: 9 (ref 5–15)
BUN: 74 mg/dL — ABNORMAL HIGH (ref 8–23)
CO2: 24 mmol/L (ref 22–32)
Calcium: 9.4 mg/dL (ref 8.9–10.3)
Chloride: 124 mmol/L — ABNORMAL HIGH (ref 98–111)
Creatinine, Ser: 3.48 mg/dL — ABNORMAL HIGH (ref 0.61–1.24)
GFR, Estimated: 16 mL/min — ABNORMAL LOW (ref >60)
Glucose, Bld: 65 mg/dL — ABNORMAL LOW (ref 70–99)
Potassium: 3.7 mmol/L (ref 3.5–5.1)
Sodium: 157 mmol/L — ABNORMAL HIGH (ref 135–145)

## 2024-05-25 LAB — CBC WITH DIFFERENTIAL/PLATELET
Abs Immature Granulocytes: 0.32 K/uL — ABNORMAL HIGH (ref 0.00–0.07)
Basophils Absolute: 0 K/uL (ref 0.0–0.1)
Basophils Relative: 0 %
Eosinophils Absolute: 0.1 K/uL (ref 0.0–0.5)
Eosinophils Relative: 0 %
HCT: 24.8 % — ABNORMAL LOW (ref 39.0–52.0)
Hemoglobin: 8.1 g/dL — ABNORMAL LOW (ref 13.0–17.0)
Immature Granulocytes: 2 %
Lymphocytes Relative: 9 %
Lymphs Abs: 1.4 K/uL (ref 0.7–4.0)
MCH: 26.1 pg (ref 26.0–34.0)
MCHC: 32.7 g/dL (ref 30.0–36.0)
MCV: 80 fL (ref 80.0–100.0)
Monocytes Absolute: 0.9 K/uL (ref 0.1–1.0)
Monocytes Relative: 6 %
Neutro Abs: 12.9 K/uL — ABNORMAL HIGH (ref 1.7–7.7)
Neutrophils Relative %: 83 %
Platelets: 227 K/uL (ref 150–400)
RBC: 3.1 MIL/uL — ABNORMAL LOW (ref 4.22–5.81)
RDW: 16.1 % — ABNORMAL HIGH (ref 11.5–15.5)
WBC: 15.6 K/uL — ABNORMAL HIGH (ref 4.0–10.5)
nRBC: 0 % (ref 0.0–0.2)

## 2024-05-25 LAB — GLUCOSE, CAPILLARY
Glucose-Capillary: 111 mg/dL — ABNORMAL HIGH (ref 70–99)
Glucose-Capillary: 140 mg/dL — ABNORMAL HIGH (ref 70–99)
Glucose-Capillary: 183 mg/dL — ABNORMAL HIGH (ref 70–99)
Glucose-Capillary: 192 mg/dL — ABNORMAL HIGH (ref 70–99)
Glucose-Capillary: 213 mg/dL — ABNORMAL HIGH (ref 70–99)
Glucose-Capillary: 242 mg/dL — ABNORMAL HIGH (ref 70–99)
Glucose-Capillary: 292 mg/dL — ABNORMAL HIGH (ref 70–99)
Glucose-Capillary: 58 mg/dL — ABNORMAL LOW (ref 70–99)
Glucose-Capillary: 62 mg/dL — ABNORMAL LOW (ref 70–99)
Glucose-Capillary: 90 mg/dL (ref 70–99)

## 2024-05-25 LAB — CULTURE, BLOOD (ROUTINE X 2)
Culture: NO GROWTH
Culture: NO GROWTH
Special Requests: ADEQUATE

## 2024-05-25 LAB — TRIGLYCERIDES: Triglycerides: 56 mg/dL (ref <150)

## 2024-05-25 MED ORDER — DEXTROSE IN LACTATED RINGERS 5 % IV SOLN: INTRAVENOUS | Status: DC

## 2024-05-25 MED ORDER — JUVEN PO PACK
1.0000 | PACK | Freq: Two times a day (BID) | ORAL | Status: DC
  Administered 2024-05-25 – 2024-06-01 (×15): 1
  Filled 2024-05-25 (×14): qty 1

## 2024-05-25 MED ORDER — VITAL 1.5 CAL PO LIQD
1000.0000 mL | ORAL | Status: DC
  Administered 2024-05-25 – 2024-05-31 (×6): 1000 mL
  Filled 2024-05-25 (×4): qty 1000

## 2024-05-25 MED ORDER — ORAL CARE MOUTH RINSE: 15.0000 mL | OROMUCOSAL | Status: DC | PRN

## 2024-05-25 MED ORDER — DEXTROSE 5 % IV SOLN: INTRAVENOUS | Status: AC

## 2024-05-25 MED ORDER — ORAL CARE MOUTH RINSE
15.0000 mL | OROMUCOSAL | Status: DC
  Administered 2024-05-25 – 2024-05-29 (×44): 15 mL via OROMUCOSAL

## 2024-05-25 NOTE — Progress Notes (Signed)
 PT Cancellation Note  Patient Details Name: Jonathan Baird MRN: [id number] DOB: [birth year]   Cancelled Treatment:    Reason Eval/Treat Not Completed: (P) Medical issues which prohibited therapy Pt remains intubated and sedated. PT has checked in for appropriateness the x3 days. PT signing off. Please reorder when pt is at a level of consciousness to participate in therapy. Thank you.  Jonathan Baird PT, DPT Acute Rehabilitation Services Please use secure chat or  Call Office [phone]    Jonathan Baird Fleeta [facility] 05/25/2024, 8:37 AM

## 2024-05-25 NOTE — Progress Notes (Signed)
 SLP Cancellation Note  Patient Details Name: Jonathan Baird MRN: [id number] DOB: 16-Jun-1933   Cancelled treatment:       Reason Eval/Treat Not Completed: Patient not medically ready (remains on vent). Will f/u as able.     Leita SAILOR., M.A. CCC-SLP Acute Rehabilitation Services Office: [phone]  Secure chat preferred  05/25/2024, 7:38 AM

## 2024-05-25 NOTE — TOC Progression Note (Signed)
 Transition of Care [facility]) - Progression Note    Patient Details  Name: Jonathan Baird MRN: [id number] Date of Birth: [birth year]  [facility] CM/SW Contact  Tom-Johnson, Doryce Mcgregory Daphne, RN Phone Number: 05/25/2024, 2:55 PM  Clinical Narrative:     Patient continues on vent and off sedations, failed weaning trials. Palliative following with GOC. Continues on IV abx.    Patient not Medically ready for discharge.  CM will continue to follow for discharge needs as patient progresses with care towards discharge.            Expected Discharge Plan and Services                                               Social Determinants of Health (SDOH) Interventions SDOH Screenings   Food Insecurity: Patient Unable To Answer (05/22/2024)  Housing: Unknown (05/22/2024)  Transportation Needs: Patient Unable To Answer (05/22/2024)  Utilities: Patient Unable To Answer (05/22/2024)  Social Connections: Patient Unable To Answer (05/22/2024)  Tobacco Use: Low Risk  (05/20/2024)    Readmission Risk Interventions     No data to display

## 2024-05-25 NOTE — Progress Notes (Signed)
 Nutrition Follow-up  DOCUMENTATION CODES:  Severe malnutrition in context of chronic illness (dementia, CKD IV)  INTERVENTION:  Continue tube feeding via NGT: Vital 1.5 at 45 ml/h (1080 ml per day) Advance to 35 and then continue advancing by 10mL q6h to goal free water  q4h Provides 1620 kcal, 72 gm protein, 825 ml free water  daily (TF+flush = free water ) Monitor GOC discussions with PMT 1 packet Juven BID, each packet provides 95 calories, 2.5 grams of protein (collagen) + micronutrients to support wound healing  NUTRITION DIAGNOSIS:  Severe Malnutrition related to chronic illness (dementia, CKD IV) as evidenced by severe muscle depletion, severe fat depletion. - Remains applicable  GOAL:  Patient will meet greater than or equal to 90% of their needs - progressing  MONITOR:  Vent status, Diet advancement  REASON FOR ASSESSMENT:  Ventilator    ASSESSMENT:  Pt with hx HTN, HLD, dementia, type 2 diabetes, CKD IV, and PVD. Hx of colon cancer s/p hemicolectomy (2005). Pt admitted from home for acute hypoxemic respiratory failure with a witnessed seizure in the ED. Pt required intubation for airway protection.  7/9 - presented to ED, imaging concerning for SBO, NGT place for decompression 7/10 - intubated 7/11 - trickle feeds initiated  Patient is currently intubated on ventilator support. No family at bedside at this time. Discussed with RN, attempted vent weaning today which was very poorly tolerated. PMT has been working with family to decide on GOC. Pt is DNR but continuing all supportive measures at this time.   Pt has been on trickle feeds since 7/11. Discussed with attending, ok to begin advancing to goal. Will also add juven as recommended per previous RD.   MV: 8.7 L/min Temp (24hrs), Avg:98.4 F (36.9 C), Min:97.3 F (36.3 C), Max:99 F (37.2 C) MAP (cuff): 90 mmHg  Admit weight: 65 kg  Current weight: 63 kg   Intake/Output Summary (Last 24 hours)  at 05/25/2024 1420 Last data filed at 05/25/2024 1200 Gross per 24 hour  Intake 4366.94 ml  Output 1717 ml  Net 2649.94 ml  Net IO Since Admission: 5,112.92 mL [05/25/24 1420]  Drains/Lines: UOP 1875 x 24 hours  Nutritionally Relevant Medications: Scheduled Meds:  docusate  100 mg Per Tube BID   free water   200 mL Per Tube Q4H   insulin  aspart  0-15 Units Subcutaneous Q4H   pantoprazole  (PROTONIX ) IV  40 mg Intravenous Daily   polyethylene glycol  17 g Per Tube Daily   thiamine   100 mg Per Tube Daily   Continuous Infusions:  dextrose  100 mL/hr at 05/25/24 1200   doxycycline  (VIBRAMYCIN ) IV Stopped (05/25/24 0544)   feeding supplement (VITAL 1.5 CAL) 20 mL/hr at 05/25/24 1200   piperacillin -tazobactam (ZOSYN )  IV Stopped (05/25/24 0534)   PRN Meds:. dextrose   Labs Reviewed: Sodium 157, chloride 124 BUN 74, creatinine 3.48 CBG ranges from 58-183 mg/dL over the last 24 hours HgbA1c 7.6% (7/10)   NUTRITION - FOCUSED PHYSICAL EXAM: Flowsheet Row Most Recent Value  Orbital Region Unable to assess  [vent]  Upper Arm Region Severe depletion  Thoracic and Lumbar Region Severe depletion  Buccal Region Unable to assess  [vent]  Temple Region Severe depletion  Clavicle Bone Region Severe depletion  Clavicle and Acromion Bone Region Severe depletion  Scapular Bone Region Severe depletion  Dorsal Hand Unable to assess  [mittens]  Patellar Region Severe depletion  Anterior Thigh Region Severe depletion  Posterior Calf Region Severe depletion  Edema (RD Assessment) None  Hair Reviewed  Eyes Unable to assess  [sedated]  Mouth Unable to assess  [vent]  Skin Reviewed  Nails Unable to assess  [mittens]    Diet Order:   Diet Order             Diet NPO time specified  Diet effective now                   EDUCATION NEEDS:  Not appropriate for education at this time  Skin:  Skin Integrity Issues: Stage II:  - buttocks (5 x 3 cm) DTI:  - left heel (2.5 x 2.5 cm) -  right heel (5 x 4 cm)  Last BM:  7/14 - type 7  Height:  Ht Readings from Last 1 Encounters:  05/21/24 5' 10 (1.778 m)    Weight:  Wt Readings from Last 1 Encounters:  05/25/24 63 kg    Ideal Body Weight:  75.5 kg  BMI:  Body mass index is 19.93 kg/m.  Estimated Nutritional Needs:  Kcal:  1600-1800 Protein:  75-90g Fluid:  1.6-1.8L    Vernell Lukes, RD, LDN, CNSC Registered Dietitian II Please reach out via secure chat

## 2024-05-25 NOTE — Progress Notes (Addendum)
 NAME:  Jonathan Baird, MRN:  [id number], DOB:  [birth year], LOS: 4 ADMISSION DATE:  05/20/2024  History of Present Illness:  89 year old man who presented to [facility] ED 7/10 for respiratory distress. PMHx significant for HTN, HLD, T2DM, stage IV CKD, PVD, R ICA stenosis, colon CA s/p hemicolectomy, dementia. Recent brief admission 2/17-2/18 for progressive weakness and urinary frequency. Recently treated presumed UTI.   Patient initially presented to ED from home for respiratory distress. Reportedly in his USOH 7/8PM, but developed SOB and wet cough 7/9. On ED arrival, patient was afebrile with temp 99.81F, HR 130, BP 150/90, SpO2 64% on RA, improved to 89% with nebulizer and 4LNC. DuoNebs, Solumedrol and Mg administered. GCS 10. CBG 468. Labs were notable for WBC 11.4, Hgb 11.3, Plt 320. Na 143, K 3.9, CO2 16, BUN/Cr 108/3.76 (baseline Cr 2.8-2.9). LFTs WNL. INR 1.2. Trop 37 > 45. BNP 22.1. RVP negative. UA > 500 glucose, otherwise unremarkable. Serum Osm 360. B-hydroxybutyrate 0.25. LA 7.8 > 9.6 > 7.9. CXR with scattered airspace disease (perihilar) c/f PNA; CT Head NAICA. CT Chest/A/P demonstrated multiple loops of SB with suspected transition point mid-pelvis, scattered bilateral airspace disease in the lungs c/w PNA, vague pancreatic head density. Insulin  gtt was started for hyperglycemia and broad-spectrum antibiotics initiated (Vanc/Zosyn ).    While awaiting bed placement in ED, patient had a witnessed seizure lasting about 2 minutes with L eye deviation. Neuro was consulted. Spot EEG demonstrated diffuse slowing without electrographic seizures. Patient remained particularly somnolent and was poorly protecting his airway with gurgling respirations and decision was made to intubate.   PCCM consulted for ICU admission.  Pertinent  Medical History  CAD, CKD stage IV, colon cancer s/p hemicolectomy, dementia, DM, HLD, HTN, PVD   Significant Hospital Events: Including procedures, antibiotic start and  stop dates in addition to other pertinent events   7/9 - Presented to ED with AMS, SOB. 7/10 - PCCM consulted for airway protection, intubated in ED. 7/11-sedation stopped for WUA 7/13-remains off sedation  Antibiotics Doxycycline  day 5 Zosyn  day 5  Interim History / Subjective:  Palliative met with family 7/13, desires to continue current care No acute events overnight Denies pain this a.m. Able to shake/nod head yes or no, wiggle toes, move extremities when asked, does not open eyes  Objective    Blood pressure (!) 169/60, pulse 72, temperature 98.1 F (36.7 C), temperature source Oral, resp. rate 16, height 5' 10 (1.778 m), weight 63 kg, SpO2 100%.    Vent Mode: PRVC FiO2 (%):  [30 %] 30 % Set Rate:  [16 bmp] 16 bmp Vt Set:  [580 mL] 580 mL PEEP:  [5 cmH20] 5 cmH20 Plateau Pressure:  [14 cmH20-20 cmH20] 14 cmH20   Intake/Output Summary (Last 24 hours) at 05/25/2024 0922 Last data filed at 05/25/2024 0805 Gross per 24 hour  Intake 4034.3 ml  Output 1662 ml  Net 2372.3 ml   Filed Weights   05/23/24 0107 05/24/24 0500 05/25/24 0411  Weight: 62 kg 61.2 kg 63 kg    Examination: General: Sitting up in bed, intubated, in no acute distress HENT: MMM, normocephalic atraumatic Lungs: CTAB on vent Cardiovascular: RRR normal S1/S2, no M/R/G Abdomen: Normoactive bowel sounds, soft, nontender, nondistended Extremities: SCDs in place to BLE, warm well-perfused Neuro: Following verbal commands, moving all 4 extremities spontaneously  Resolved problem list  SBO  Assessment and Plan  NEURO #Metabolic encephalopathy #Background dementia Off sedation since 7/11  #Concern for seizures S/p Keppra  load, did receive  Ativan .  Ongoing EEG did not reveal seizures, was discontinued 05/23/2024 --Currently off sedation and more interactive, follows verbal commands appropriately --Palliative following, pending further GOC conversation today   #Acute left frontal lobe infarct-2  mm MRA negative.  Stroke team signed off 7/12 - Remains on aspirin  81mg  daily -- no statin given low LDL - Neuroprotective measures  PULMONARY #Acute hypoxemic respiratory failure #Aspiration pneumonia -Continue broad-spectrum antibiotics -Zosyn  and doxycycline  -Continue mechanical ventilation -Target TVol 6-8cc/kgIBW -Target Plateau Pressure < 30cm H20 -Target driving pressure less than 15 cm of water  -Target PaO2 55-65: titrate PEEP/FiO2 per protocol -VAP prevention protocol -continue bronchodilators -CTM daily CBC differential   NEPHRO #Acute kidney injury - Avoid nephrotoxic medications - Continue to maintain renal perfusion - LR at 100 cc/hr -CTM with daily BMP   #Hypernatremia 157 this a.m. -Continue 200 mL free water  every 4 hour -D5W as below  CV #HTN -- As needed hydro, labetalol  for goal SBP <160   FEN/GI  # SBO, resolved without surgical intervention (GS signed off) A1c 7.6, goal <7 per neuro given acute stroke - Continue tube feeds per RD - Moderate SSI -Thiamine  supplementation --D5W 100 mL/h   #History of colon cancer s/p hemicolectomy -- CTM  Best Practice (right click and Reselect all SmartList Selections daily)   Diet/type: tubefeeds DVT prophylaxis prophylactic heparin   Pressure ulcer(s): present on admission  GI prophylaxis: PPI Lines: N/A Foley:  Yes, and it is still needed Code Status:  DNR Last date of multidisciplinary goals of care discussion [7/13 with palliative, family desires ongoing treatment, PMT to have further discussions re: comfort measures]  Labs   CBC: Recent Labs  Lab 05/20/24 1957 05/21/24 0317 05/21/24 0444 05/22/24 0555 05/23/24 0419 05/24/24 0457 05/25/24 0331  WBC 11.4*  --  15.8* 10.6* 15.6* 16.2* 15.6*  NEUTROABS 10.5*  --   --   --  13.5* 13.8* 12.9*  HGB 11.3*   < > 8.2* 8.6* 9.0* 8.3* 8.1*  HCT 34.8*   < > 24.8* 25.1* 26.8* 25.1* 24.8*  MCV 79.8*  --  80.0 76.5* 78.4* 79.4* 80.0  PLT 320  --   198 219 218 221 227   < > = values in this interval not displayed.    Basic Metabolic Panel: Recent Labs  Lab 05/20/24 1804 05/20/24 1830 05/21/24 0444 05/22/24 0555 05/23/24 0419 05/24/24 0457 05/24/24 1614 05/25/24 0331  NA 143   < > 135 146* 149* 154* 156* 157*  K 3.9   < > 3.7 4.0 4.2 4.3 3.9 3.7  CL 103   < > 104 112* 115* 121* 125* 124*  CO2 16*   < > 19* 23 28 24  21* 24  GLUCOSE 411*   < > 182* 103* 130* 169* 184* 65*  BUN 108*   < > 87* 101* 100* 93* 85* 74*  CREATININE 3.76*   < > 2.62* 3.39* 3.66* 3.71* 3.62* 3.48*  CALCIUM  10.4*   < > 8.0* 9.3 9.3 9.6 9.3 9.4  MG 3.6*  --  2.2  --   --   --   --   --   PHOS  --   --  3.6  --   --   --   --   --    < > = values in this interval not displayed.   GFR: Estimated Creatinine Clearance: 13.1 mL/min (A) (by C-G formula based on SCr of 3.48 mg/dL (H)). Recent Labs  Lab 05/20/24 2232 05/21/24 0119 05/21/24 0444  05/22/24 0555 05/22/24 1113 05/23/24 0419 05/24/24 0457 05/25/24 0331  WBC  --   --  15.8* 10.6*  --  15.6* 16.2* 15.6*  LATICACIDVEN 7.9* 5.4* 3.1*  --  2.2*  --   --   --     Liver Function Tests: Recent Labs  Lab 05/20/24 1804 05/21/24 0444 05/22/24 0555  AST 24 14* 14*  ALT 11 8 10   ALKPHOS 82 47 57  BILITOT 0.5 2.8* 0.6  PROT 7.1 4.5* 5.3*  ALBUMIN 3.0* 1.9* 2.1*   No results for input(s): LIPASE, AMYLASE in the last 168 hours. Recent Labs  Lab 05/21/24 1224  AMMONIA <13    ABG    Component Value Date/Time   PHART 7.406 05/21/2024 0317   PCO2ART 35.2 05/21/2024 0317   PO2ART 431 (H) 05/21/2024 0317   HCO3 22.0 05/21/2024 0317   TCO2 23 05/21/2024 0317   ACIDBASEDEF 2.0 05/21/2024 0317   O2SAT 100 05/21/2024 0317     Coagulation Profile: Recent Labs  Lab 05/20/24 1804  INR 1.2    Cardiac Enzymes: No results for input(s): CKTOTAL, CKMB, CKMBINDEX, TROPONINI in the last 168 hours.  HbA1C: Hgb A1c MFr Bld  Date/Time Value Ref Range Status  05/21/2024 04:44 AM  7.6 (H) 4.8 - 5.6 % Final    Comment:    (NOTE)         Prediabetes: 5.7 - 6.4         Diabetes: >6.4         Glycemic control for adults with diabetes: <7.0   10/20/2023 01:25 AM 8.2 (H) 4.8 - 5.6 % Final    Comment:    (NOTE)         Prediabetes: 5.7 - 6.4         Diabetes: >6.4         Glycemic control for adults with diabetes: <7.0     CBG: Recent Labs  Lab 05/25/24 0325 05/25/24 0405 05/25/24 0426 05/25/24 0518 05/25/24 0801  GLUCAP 58* 62* 90 111* 140*    Review of Systems:   None  Past Medical History:  He,  has a past medical history of Carotid artery stenosis, CKD (chronic kidney disease), stage IV (HCC), Colon cancer (HCC) (10/12/2004), Dementia (HCC), Diabetes mellitus, HLD (hyperlipidemia), HTN (hypertension), and Peripheral vascular disease (HCC).   Surgical History:   Past Surgical History:  Procedure Laterality Date   hemocolectomy  11/13/2003     Social History:   reports that he has never smoked. He has never used smokeless tobacco. He reports that he does not drink alcohol and does not use drugs.   Family History:  His family history includes Cancer in his mother and sister; Diabetes in his father.   Allergies No Known Allergies   Home Medications  Prior to Admission medications   Medication Sig Start Date End Date Taking? Authorizing Provider  GVOKE HYPOPEN 2-PACK 1 MG/0.2ML SOAJ Inject 1 mg into the skin once as needed (for hypoglycemia). 02/08/22  Yes [provider]  hydrALAZINE  (APRESOLINE ) 50 MG tablet Take 50 mg by mouth daily at 12 noon. 12/03/23  Yes [provider]  insulin  glargine (LANTUS ) 100 UNIT/ML injection Inject 4 Units into the skin daily as needed (bg greater than 250).   Yes [provider]  insulin  lispro (HUMALOG KWIKPEN) 100 UNIT/ML KwikPen Inject 4 Units into the skin daily as needed (bg greater than 250).   Yes [provider]  TRULICITY  1.5 MG/0.5ML SOAJ Inject 1.5  mg into the skin  once a week. 12/04/23  Yes [provider]  fluticasone (FLONASE) 50 MCG/ACT nasal spray Place 1 spray into both nostrils 2 (two) times daily. Patient not taking: Reported on 05/22/2024 03/12/24   [provider]     Critical care time: 30 min

## 2024-05-25 NOTE — Plan of Care (Signed)
   Problem: Clinical Measurements: Goal: Ability to maintain clinical measurements within normal limits will improve Outcome: Progressing

## 2024-05-25 NOTE — Progress Notes (Signed)
   Palliative Medicine Inpatient Follow Up Note HPI: 89 year old man who presented to [facility] ED 7/10 for respiratory distress. PMHx significant for HTN, HLD, T2DM, stage IV CKD, PVD, R ICA stenosis, colon CA s/p hemicolectomy, dementia. The Palliative care team as been asked to support additional goals of care conversations.   Today's Discussion 05/25/2024  *Please note that this is a verbal dictation therefore any spelling or grammatical errors are due to the Dragon [employer] One system interpretation.  Chart reviewed inclusive of vital signs, progress notes, laboratory results, and diagnostic images.   I met with Jonathan Baird this morning. He is more alert and able to follow some basic directions. He does not appear to be distressed of uncomfortable on assessment.   I called and spoke with patients daughter, Jonathan Baird. I created space and opportunity for patients daughter to explore thoughts feelings and fears regarding current medical situation.She shares the family has spoken and are all hoping things get better for [name]. They plan to take it one days at a time and aware of the situation severity and possibilities if things do not improve.   Continue to allow time for outcomes at this time.   Questions and concerns addressed/Palliative Support Provided.   Objective Assessment: Vital Signs Vitals:   05/25/24 1200 05/25/24 1230  BP: (!) 158/55 (!) 152/59  Pulse: 62 (!) 52  Resp: 16 (!) 24  Temp:    SpO2: 100% 100%    Intake/Output Summary (Last 24 hours) at 05/25/2024 1240 Last data filed at 05/25/2024 1200 Gross per 24 hour  Intake 4366.94 ml  Output 1717 ml  Net 2649.94 ml   Last Weight  Most recent update: 05/25/2024  4:12 AM    Weight  63 kg (138 lb 14.2 oz)            Gen:  Elderly AA M chronically ill appearing HEENT: NGT, dry mucous membranes CV: Regular rate and rhythm  PULM:  On mechanical ventilator ABD: soft/nontender  EXT: No edema  Neuro: Opens eyes and follows some  direction(s)  SUMMARY OF RECOMMENDATIONS   DNR - no compressions   Plan to allow time for outcomes  Patients family are hoping patient will get better   Ongoing PMT support ________________________________________________________________________ Jonathan Baird [facility] Palliative Medicine Team Team Cell Phone: [phone] Please utilize secure chat with additional questions, if there is no response within 30 minutes please call the above phone number  Billing based on MDM: Moderate  Palliative Medicine Team providers are available by phone from 7am to 7pm daily and can be reached through the team cell phone.  Should this patient require assistance outside of these hours, please call the patient's attending physician.

## 2024-05-26 DIAGNOSIS — Z7189 Other specified counseling: Secondary | ICD-10-CM | POA: Diagnosis not present

## 2024-05-26 DIAGNOSIS — R4182 Altered mental status, unspecified: Secondary | ICD-10-CM | POA: Diagnosis not present

## 2024-05-26 DIAGNOSIS — Z515 Encounter for palliative care: Secondary | ICD-10-CM | POA: Diagnosis not present

## 2024-05-26 DIAGNOSIS — J9601 Acute respiratory failure with hypoxia: Secondary | ICD-10-CM | POA: Diagnosis not present

## 2024-05-26 DIAGNOSIS — Z711 Person with feared health complaint in whom no diagnosis is made: Secondary | ICD-10-CM | POA: Diagnosis not present

## 2024-05-26 DIAGNOSIS — K56609 Unspecified intestinal obstruction, unspecified as to partial versus complete obstruction: Secondary | ICD-10-CM | POA: Diagnosis not present

## 2024-05-26 DIAGNOSIS — N179 Acute kidney failure, unspecified: Secondary | ICD-10-CM | POA: Diagnosis not present

## 2024-05-26 LAB — BASIC METABOLIC PANEL WITH GFR
Anion gap: 11 (ref 5–15)
BUN: 70 mg/dL — ABNORMAL HIGH (ref 8–23)
CO2: 23 mmol/L (ref 22–32)
Calcium: 9.1 mg/dL (ref 8.9–10.3)
Chloride: 117 mmol/L — ABNORMAL HIGH (ref 98–111)
Creatinine, Ser: 3.18 mg/dL — ABNORMAL HIGH (ref 0.61–1.24)
GFR, Estimated: 18 mL/min — ABNORMAL LOW (ref >60)
Glucose, Bld: 181 mg/dL — ABNORMAL HIGH (ref 70–99)
Potassium: 4.3 mmol/L (ref 3.5–5.1)
Sodium: 151 mmol/L — ABNORMAL HIGH (ref 135–145)

## 2024-05-26 LAB — CBC WITH DIFFERENTIAL/PLATELET
Abs Immature Granulocytes: 0.23 K/uL — ABNORMAL HIGH (ref 0.00–0.07)
Basophils Absolute: 0 K/uL (ref 0.0–0.1)
Basophils Relative: 0 %
Eosinophils Absolute: 0.2 K/uL (ref 0.0–0.5)
Eosinophils Relative: 2 %
HCT: 26.2 % — ABNORMAL LOW (ref 39.0–52.0)
Hemoglobin: 8.2 g/dL — ABNORMAL LOW (ref 13.0–17.0)
Immature Granulocytes: 2 %
Lymphocytes Relative: 11 %
Lymphs Abs: 1.4 K/uL (ref 0.7–4.0)
MCH: 26.1 pg (ref 26.0–34.0)
MCHC: 31.3 g/dL (ref 30.0–36.0)
MCV: 83.4 fL (ref 80.0–100.0)
Monocytes Absolute: 0.9 K/uL (ref 0.1–1.0)
Monocytes Relative: 7 %
Neutro Abs: 9.5 K/uL — ABNORMAL HIGH (ref 1.7–7.7)
Neutrophils Relative %: 78 %
Platelets: 175 K/uL (ref 150–400)
RBC: 3.14 MIL/uL — ABNORMAL LOW (ref 4.22–5.81)
RDW: 16.7 % — ABNORMAL HIGH (ref 11.5–15.5)
WBC: 12.1 K/uL — ABNORMAL HIGH (ref 4.0–10.5)
nRBC: 0.2 % (ref 0.0–0.2)

## 2024-05-26 LAB — VITAMIN B1: Vitamin B1 (Thiamine): 92.3 nmol/L (ref 66.5–200.0)

## 2024-05-26 LAB — GLUCOSE, CAPILLARY
Glucose-Capillary: 210 mg/dL — ABNORMAL HIGH (ref 70–99)
Glucose-Capillary: 182 mg/dL — ABNORMAL HIGH (ref 70–99)
Glucose-Capillary: 238 mg/dL — ABNORMAL HIGH (ref 70–99)
Glucose-Capillary: 200 mg/dL — ABNORMAL HIGH (ref 70–99)
Glucose-Capillary: 261 mg/dL — ABNORMAL HIGH (ref 70–99)
Glucose-Capillary: 204 mg/dL — ABNORMAL HIGH (ref 70–99)

## 2024-05-26 MED ORDER — INSULIN GLARGINE-YFGN 100 UNIT/ML ~~LOC~~ SOLN
10.0000 [IU] | Freq: Every day | SUBCUTANEOUS | Status: DC
  Administered 2024-05-26 – 2024-06-04 (×10): 10 [IU] via SUBCUTANEOUS
  Filled 2024-05-26 (×12): qty 0.1

## 2024-05-26 MED ORDER — DEXTROSE 5 % IV SOLN: INTRAVENOUS | Status: AC

## 2024-05-26 MED ORDER — AMLODIPINE BESYLATE 10 MG PO TABS
10.0000 mg | ORAL_TABLET | Freq: Every day | ORAL | Status: DC
  Administered 2024-05-26 – 2024-06-01 (×7): 10 mg
  Filled 2024-05-26 (×7): qty 1

## 2024-05-26 NOTE — Plan of Care (Signed)
  Problem: Respiratory: Goal: Ability to maintain a clear airway and adequate ventilation will improve Outcome: Progressing   Problem: Clinical Measurements: Goal: Ability to maintain clinical measurements within normal limits will improve Outcome: Progressing Goal: Diagnostic test results will improve Outcome: Progressing Goal: Respiratory complications will improve Outcome: Progressing   Problem: Nutrition: Goal: Adequate nutrition will be maintained Outcome: Progressing

## 2024-05-26 NOTE — Progress Notes (Signed)
 Palliative Medicine Inpatient Follow Up Note HPI: 89 year old man who presented to [facility] ED 7/10 for respiratory distress. PMHx significant for HTN, HLD, T2DM, stage IV CKD, PVD, R ICA stenosis, colon CA s/p hemicolectomy, dementia. The Palliative care team as been asked to support additional goals of care conversations.   Today's Discussion 05/26/2024  *Please note that this is a verbal dictation therefore any spelling or grammatical errors are due to the Dragon Medical One system interpretation.  Chart reviewed inclusive of vital signs, progress notes, laboratory results, and diagnostic images.   I have spoke to the critical care team. [facility] at this time is not able to wean of ventilatory support. We reviewed the need for a family meeting.   I met with Jonathan Baird this morning, he is arousable though seems generally uncomfortable with the tube in his windpipe.   I called and spoke with patients daughter, Jonathan Baird who shares that they are on the way to the hospital and can meet this afternoon.  _____________________________  I met with patients spouse, Jonathan Baird and her daughter, Jonathan Baird.   Providers present were myself and Dr. Adele. We reviewed patients inability to pass SBT's. We discussed the concern(s) that Jonathan Baird is not showing the ability at this point to be weaned off of ventilatory support. Patients daughter asks various questions about his state when he came in to present. We reviewed that he is elderly, frail, and has multiple co-morbidities. These things place him at high risk for acute decompensation. We discussed pneumonia in a geriatric patient can have profound effects.  We discussed that a tracheotomy and gastrostomy tube in Jonathan Baird's case would not improve his quality of life, in fact it would place him at higher risk for additional adverse events. We also discussed that he had, per his wife not desired incumbering life sustaining devices.   We discussed the difficulty associated with  these decisions. We reviewed the options at this time which would be one way extubation to see if Jonathan Baird could fair on his own and if he was not able to transition to comfort care. We also discussed the option of transition to comfort care upon extubation.  Patients wife shares that she would like time to process and speak to her other daughter who lives in [place] .   Plan to follow up on family conversation(s) in the oncoming day(s).  Questions and concerns addressed/Palliative Support Provided.   Objective Assessment: Vital Signs Vitals:   05/26/24 1230 05/26/24 1245  BP: (!) 171/63 (!) 146/50  Pulse: 66 66  Resp: 16 (!) 9  Temp:    SpO2: 100% 100%    Intake/Output Summary (Last 24 hours) at 05/26/2024 1312 Last data filed at 05/26/2024 1257 Gross per 24 hour  Intake 3798.27 ml  Output 1665 ml  Net 2133.27 ml   Last Weight  Most recent update: 05/26/2024  4:21 AM    Weight  66.2 kg (145 lb 15.1 oz)            Gen:  Elderly AA M chronically ill appearing HEENT: NGT, dry mucous membranes CV: Regular rate and rhythm  PULM:  On mechanical ventilator ABD: soft/nontender  EXT: No edema  Neuro: Opens eyes and follows some direction(s)  SUMMARY OF RECOMMENDATIONS   DNR - no compressions  Family meeting held, patients wife feels patient has no QOL --> Discussed options of one way extubation versus extubation to comfort. Family need time to process and speak among themselves to determine next steps.  Ongoing PMT support ________________________________________________________________________ Jonathan Baird Palliative Medicine Team Team Cell Phone: [phone] Please utilize secure chat with additional questions, if there is no response within 30 minutes please call the above phone number  Time: 34 Billing based on MDM: High  Palliative Medicine Team providers are available by phone from 7am to 7pm daily and can be reached through the team cell phone.   Should this patient require assistance outside of these hours, please call the patient's attending physician.

## 2024-05-26 NOTE — Progress Notes (Signed)
 NAME:  Jonathan Baird, MRN:  [id number], DOB:  14-Sep-1933, LOS: 5 ADMISSION DATE:  05/20/2024  History of Present Illness:  89 year old man who presented to [facility] ED 7/10 for respiratory distress. PMHx significant for HTN, HLD, T2DM, stage IV CKD, PVD, R ICA stenosis, colon CA s/p hemicolectomy, dementia. Recent brief admission 2/17-2/18 for progressive weakness and urinary frequency. Recently treated presumed UTI.   Patient initially presented to ED from home for respiratory distress. Reportedly in his USOH 7/8PM, but developed SOB and wet cough 7/9. On ED arrival, patient was afebrile with temp 99.68F, HR 130, BP 150/90, SpO2 64% on RA, improved to 89% with nebulizer and 4LNC. DuoNebs, Solumedrol and Mg administered. GCS 10. CBG 468. Labs were notable for WBC 11.4, Hgb 11.3, Plt 320. Na 143, K 3.9, CO2 16, BUN/Cr 108/3.76 (baseline Cr 2.8-2.9). LFTs WNL. INR 1.2. Trop 37 > 45. BNP 22.1. RVP negative. UA > 500 glucose, otherwise unremarkable. Serum Osm 360. B-hydroxybutyrate 0.25. LA 7.8 > 9.6 > 7.9. CXR with scattered airspace disease (perihilar) c/f PNA; CT Head NAICA. CT Chest/A/P demonstrated multiple loops of SB with suspected transition point mid-pelvis, scattered bilateral airspace disease in the lungs c/w PNA, vague pancreatic head density. Insulin  gtt was started for hyperglycemia and broad-spectrum antibiotics initiated (Vanc/Zosyn ).    While awaiting bed placement in ED, patient had a witnessed seizure lasting about 2 minutes with L eye deviation. Neuro was consulted. Spot EEG demonstrated diffuse slowing without electrographic seizures. Patient remained particularly somnolent and was poorly protecting his airway with gurgling respirations and decision was made to intubate.   PCCM consulted for ICU admission.  Pertinent  Medical History  CAD, CKD stage IV, colon cancer s/p hemicolectomy, dementia, DM, HLD, HTN, PVD   Significant Hospital Events: Including procedures, antibiotic start and  stop dates in addition to other pertinent events   7/9 - Presented to ED with AMS, SOB. 7/10 - PCCM consulted for airway protection, intubated in ED. 7/11-sedation stopped for WUA 7/13-remains off sedation  ABX- day 7  7/9: vanc/cefepime  7/10-7/15: doxy and zosyn   Interim History / Subjective:  No acute events overnight Elevated SBP's today Pending follow-up GOC conversation  Objective    Blood pressure (!) 188/66, pulse 60, temperature 98.2 F (36.8 C), temperature source Oral, resp. rate (!) 23, height 5' 10 (1.778 m), weight 66.2 kg, SpO2 100%.    Vent Mode: PRVC FiO2 (%):  [30 %] 30 % Set Rate:  [16 bmp] 16 bmp Vt Set:  [580 mL] 580 mL PEEP:  [5 cmH20] 5 cmH20 Plateau Pressure:  [13 cmH20-15 cmH20] 15 cmH20   Intake/Output Summary (Last 24 hours) at 05/26/2024 0659 Last data filed at 05/26/2024 0600 Gross per 24 hour  Intake 3904.25 ml  Output 1417 ml  Net 2487.25 ml   Filed Weights   05/24/24 0500 05/25/24 0411 05/26/24 0421  Weight: 61.2 kg 63 kg 66.2 kg    Examination: General: Sitting up in bed, awake and somewhat alert, intubated HENT: MMM, normocephalic and atraumatic Lungs: CTAB on vent Cardiovascular: RRR normal S1/S2, no M/R/G Abdomen: Normoactive bowel sounds, soft, nontender, nondistended Extremities: Prevalon boots in place bilaterally, warm and well-perfused Neuro: Awake, intermittently opening eyes, following some commands, moves all extremities spontaneously Skin: Warm, dry  Resolved problem list  SBO  Assessment and Plan  NEURO #Metabolic encephalopathy #Background dementia Off sedation since 7/11. Neuro exam stable. --Palliative following, will need further GOC conversation   #Concern for seizures S/p Keppra  load, did receive Ativan .  Ongoing EEG did not reveal seizures, was discontinued 05/23/2024 --seizure, fall precautions   #Acute left frontal lobe infarct-2 mm MRA negative.  Stroke team signed off 7/12 -- Remains on aspirin   81mg  daily -- no statin given low LDL -- Neuroprotective measures --Will reconsult PT/OT/speech if patient is able to wean off vent   PULMONARY #Acute hypoxemic respiratory failure #Aspiration pneumonia Afebrile overnight, WBC continues to downtrend. --Continue broad-spectrum antibiotics -Zosyn  and doxycycline . Will d/c after doses today to complete 7 day course --Pt has not tolerated multiple SBTs and continues to require vent, wean as able -Target TVol 6-8cc/kgIBW -Target Plateau Pressure < 30cm H20 -Target driving pressure less than 15 cm of water  -Target PaO2 55-65: titrate PEEP/FiO2 per protocol -VAP prevention protocol --continue bronchodilators --CTM daily CBC differential   NEPHRO #Acute kidney injury Creatinine 3.18 today. -- Avoid nephrotoxic medications -- Continue to maintain renal perfusion -- D5W at 100 cc/hr -- CTM with daily BMP   #Hypernatremia Improved to 151 this a.m from 157 yesterday.  Will continue to slowly correct --Continue 200 mL free water  every 4 hours --D5W @100mL /hr   CV #HTN SBP in the 180s to 190s today.  --restarting home amlodipine  10mg  daily -- As needed hydral 10 mg for goal SBP <160     FEN/GI  # SBO, resolved without surgical intervention (GS signed off) # DM2 A1c 7.6, goal <7 per neuro given acute stroke -- Continue tube feeds per RD, advancing to goal -- Adding 10u LAI given tube feed advancement and D5 infusion -- Moderate SSI --Thiamine  supplementation --D5W 100 mL/h   #History of colon cancer s/p hemicolectomy -- CTM  Best Practice (right click and Reselect all SmartList Selections daily)   Diet/type: tubefeeds DVT prophylaxis prophylactic heparin   Pressure ulcer(s): present on admission  GI prophylaxis: PPI Lines: N/A Foley:  Yes, and it is still needed Code Status:  DNR Last date of multidisciplinary goals of care discussion [7/13 with palliative, family desires ongoing treatment, PMT to have further GOC  discussions]  Labs   CBC: Recent Labs  Lab 05/20/24 1957 05/21/24 0317 05/22/24 0555 05/23/24 0419 05/24/24 0457 05/25/24 0331 05/26/24 0551  WBC 11.4*   < > 10.6* 15.6* 16.2* 15.6* 12.1*  NEUTROABS 10.5*  --   --  13.5* 13.8* 12.9* 9.5*  HGB 11.3*   < > 8.6* 9.0* 8.3* 8.1* 8.2*  HCT 34.8*   < > 25.1* 26.8* 25.1* 24.8* 26.2*  MCV 79.8*   < > 76.5* 78.4* 79.4* 80.0 83.4  PLT 320   < > 219 218 221 227 175   < > = values in this interval not displayed.    Basic Metabolic Panel: Recent Labs  Lab 05/20/24 1804 05/20/24 1830 05/21/24 0444 05/22/24 0555 05/23/24 0419 05/24/24 0457 05/24/24 1614 05/25/24 0331 05/26/24 0551  NA 143   < > 135   < > 149* 154* 156* 157* 151*  K 3.9   < > 3.7   < > 4.2 4.3 3.9 3.7 4.3  CL 103   < > 104   < > 115* 121* 125* 124* 117*  CO2 16*   < > 19*   < > 28 24 21* 24 23  GLUCOSE 411*   < > 182*   < > 130* 169* 184* 65* 181*  BUN 108*   < > 87*   < > 100* 93* 85* 74* 70*  CREATININE 3.76*   < > 2.62*   < > 3.66* 3.71* 3.62* 3.48* 3.18*  CALCIUM  10.4*   < > 8.0*   < > 9.3 9.6 9.3 9.4 9.1  MG 3.6*  --  2.2  --   --   --   --   --   --   PHOS  --   --  3.6  --   --   --   --   --   --    < > = values in this interval not displayed.   GFR: Estimated Creatinine Clearance: 15 mL/min (A) (by C-G formula based on SCr of 3.18 mg/dL (H)). Recent Labs  Lab 05/20/24 2232 05/21/24 0119 05/21/24 0444 05/22/24 0555 05/22/24 1113 05/23/24 0419 05/24/24 0457 05/25/24 0331 05/26/24 0551  WBC  --   --  15.8*   < >  --  15.6* 16.2* 15.6* 12.1*  LATICACIDVEN 7.9* 5.4* 3.1*  --  2.2*  --   --   --   --    < > = values in this interval not displayed.    Liver Function Tests: Recent Labs  Lab 05/20/24 1804 05/21/24 0444 05/22/24 0555  AST 24 14* 14*  ALT 11 8 10   ALKPHOS 82 47 57  BILITOT 0.5 2.8* 0.6  PROT 7.1 4.5* 5.3*  ALBUMIN 3.0* 1.9* 2.1*   No results for input(s): LIPASE, AMYLASE in the last 168 hours. Recent Labs  Lab  05/21/24 1224  AMMONIA <13    ABG    Component Value Date/Time   PHART 7.406 05/21/2024 0317   PCO2ART 35.2 05/21/2024 0317   PO2ART 431 (H) 05/21/2024 0317   HCO3 22.0 05/21/2024 0317   TCO2 23 05/21/2024 0317   ACIDBASEDEF 2.0 05/21/2024 0317   O2SAT 100 05/21/2024 0317     Coagulation Profile: Recent Labs  Lab 05/20/24 1804  INR 1.2    Cardiac Enzymes: No results for input(s): CKTOTAL, CKMB, CKMBINDEX, TROPONINI in the last 168 hours.  HbA1C: Hgb A1c MFr Bld  Date/Time Value Ref Range Status  05/21/2024 04:44 AM 7.6 (H) 4.8 - 5.6 % Final    Comment:    (NOTE)         Prediabetes: 5.7 - 6.4         Diabetes: >6.4         Glycemic control for adults with diabetes: <7.0   10/20/2023 01:25 AM 8.2 (H) 4.8 - 5.6 % Final    Comment:    (NOTE)         Prediabetes: 5.7 - 6.4         Diabetes: >6.4         Glycemic control for adults with diabetes: <7.0     CBG: Recent Labs  Lab 05/25/24 1131 05/25/24 1525 05/25/24 1936 05/25/24 2328 05/26/24 0321  GLUCAP 183* 213* 242* 292* 210*    Review of Systems:   N/A, patient intubated and with dementia at baseline  Past Medical History:  He,  has a past medical history of Carotid artery stenosis, CKD (chronic kidney disease), stage IV (HCC), Colon cancer (HCC) (10/12/2004), Dementia (HCC), Diabetes mellitus, HLD (hyperlipidemia), HTN (hypertension), and Peripheral vascular disease (HCC).   Surgical History:   Past Surgical History:  Procedure Laterality Date   hemocolectomy  11/13/2003     Social History:   reports that he has never smoked. He has never used smokeless tobacco. He reports that he does not drink alcohol and does not use drugs.   Family History:  His family history includes Cancer in his mother and sister;  Diabetes in his father.   Allergies No Known Allergies   Home Medications  Prior to Admission medications   Medication Sig Start Date End Date Taking? Authorizing Provider   GVOKE HYPOPEN 2-PACK 1 MG/0.2ML SOAJ Inject 1 mg into the skin once as needed (for hypoglycemia). 02/08/22  Yes [provider]  hydrALAZINE  (APRESOLINE ) 50 MG tablet Take 50 mg by mouth daily at 12 noon. 12/03/23  Yes [provider]  insulin  glargine (LANTUS ) 100 UNIT/ML injection Inject 4 Units into the skin daily as needed (bg greater than 250).   Yes [provider]  insulin  lispro (HUMALOG KWIKPEN) 100 UNIT/ML KwikPen Inject 4 Units into the skin daily as needed (bg greater than 250).   Yes [provider]  TRULICITY  1.5 MG/0.5ML SOAJ Inject 1.5 mg into the skin once a week. 12/04/23  Yes [provider]  fluticasone (FLONASE) 50 MCG/ACT nasal spray Place 1 spray into both nostrils 2 (two) times daily. Patient not taking: Reported on 05/22/2024 03/12/24   [provider]     Critical care time: 30 min

## 2024-05-27 DIAGNOSIS — Z7189 Other specified counseling: Secondary | ICD-10-CM | POA: Diagnosis not present

## 2024-05-27 DIAGNOSIS — E870 Hyperosmolality and hypernatremia: Secondary | ICD-10-CM | POA: Diagnosis not present

## 2024-05-27 DIAGNOSIS — G9341 Metabolic encephalopathy: Secondary | ICD-10-CM | POA: Diagnosis not present

## 2024-05-27 DIAGNOSIS — K56609 Unspecified intestinal obstruction, unspecified as to partial versus complete obstruction: Secondary | ICD-10-CM | POA: Diagnosis not present

## 2024-05-27 DIAGNOSIS — Z515 Encounter for palliative care: Secondary | ICD-10-CM | POA: Diagnosis not present

## 2024-05-27 LAB — CBC WITH DIFFERENTIAL/PLATELET
Abs Immature Granulocytes: 0.32 K/uL — ABNORMAL HIGH (ref 0.00–0.07)
Basophils Absolute: 0 K/uL (ref 0.0–0.1)
Basophils Relative: 0 %
Eosinophils Absolute: 0.2 K/uL (ref 0.0–0.5)
Eosinophils Relative: 1 %
HCT: 24.6 % — ABNORMAL LOW (ref 39.0–52.0)
Hemoglobin: 8 g/dL — ABNORMAL LOW (ref 13.0–17.0)
Immature Granulocytes: 2 %
Lymphocytes Relative: 12 %
Lymphs Abs: 1.6 K/uL (ref 0.7–4.0)
MCH: 25.8 pg — ABNORMAL LOW (ref 26.0–34.0)
MCHC: 32.5 g/dL (ref 30.0–36.0)
MCV: 79.4 fL — ABNORMAL LOW (ref 80.0–100.0)
Monocytes Absolute: 0.7 K/uL (ref 0.1–1.0)
Monocytes Relative: 5 %
Neutro Abs: 10.7 K/uL — ABNORMAL HIGH (ref 1.7–7.7)
Neutrophils Relative %: 80 %
Platelets: 214 K/uL (ref 150–400)
RBC: 3.1 MIL/uL — ABNORMAL LOW (ref 4.22–5.81)
RDW: 15.9 % — ABNORMAL HIGH (ref 11.5–15.5)
WBC: 13.6 K/uL — ABNORMAL HIGH (ref 4.0–10.5)
nRBC: 0 % (ref 0.0–0.2)

## 2024-05-27 LAB — BASIC METABOLIC PANEL WITH GFR
Anion gap: 14 (ref 5–15)
BUN: 70 mg/dL — ABNORMAL HIGH (ref 8–23)
CO2: 23 mmol/L (ref 22–32)
Calcium: 8.8 mg/dL — ABNORMAL LOW (ref 8.9–10.3)
Chloride: 110 mmol/L (ref 98–111)
Creatinine, Ser: 2.82 mg/dL — ABNORMAL HIGH (ref 0.61–1.24)
GFR, Estimated: 21 mL/min — ABNORMAL LOW (ref >60)
Glucose, Bld: 168 mg/dL — ABNORMAL HIGH (ref 70–99)
Potassium: 3.9 mmol/L (ref 3.5–5.1)
Sodium: 147 mmol/L — ABNORMAL HIGH (ref 135–145)

## 2024-05-27 LAB — GLUCOSE, CAPILLARY
Glucose-Capillary: 128 mg/dL — ABNORMAL HIGH (ref 70–99)
Glucose-Capillary: 131 mg/dL — ABNORMAL HIGH (ref 70–99)
Glucose-Capillary: 154 mg/dL — ABNORMAL HIGH (ref 70–99)
Glucose-Capillary: 163 mg/dL — ABNORMAL HIGH (ref 70–99)
Glucose-Capillary: 177 mg/dL — ABNORMAL HIGH (ref 70–99)
Glucose-Capillary: 192 mg/dL — ABNORMAL HIGH (ref 70–99)

## 2024-05-27 MED ORDER — HYDRALAZINE HCL 20 MG/ML IJ SOLN
20.0000 mg | INTRAMUSCULAR | Status: DC | PRN
  Administered 2024-05-27 – 2024-06-01 (×4): 20 mg via INTRAVENOUS
  Filled 2024-05-27 (×4): qty 1

## 2024-05-27 MED ORDER — LABETALOL HCL 5 MG/ML IV SOLN
10.0000 mg | INTRAVENOUS | Status: DC | PRN
  Administered 2024-05-27 – 2024-05-28 (×2): 10 mg via INTRAVENOUS
  Filled 2024-05-27 (×2): qty 4

## 2024-05-27 NOTE — Progress Notes (Signed)
 eLink Physician-Brief Progress Note Patient Name: Jonathan Baird DOB: 01-Feb-1933 MRN: [id number]   Date of Service  05/27/2024  HPI/Events of Note  89 y/o with HTN, HLD, T2DM, stage IV CKD, PVD, R ICA stenosis, colon CA s/p hemicolectomy, dementia admitted with res distress, SBO, aspiration pneumonia,septic shock   got hydralazine  10mg  at 0202, bp is still elevated at 175/62  eICU Interventions  Increase Hydralazine  dose Add second line Labetalol       Intervention Category Minor Interventions: Routine modifications to care plan (e.g. PRN medications for pain, fever)  Neyland Pettengill 05/27/2024, 2:46 AM

## 2024-05-27 NOTE — Procedures (Signed)
 Extubation Procedure Note  Patient Details:   Name: Jonathan Baird DOB: Jan 14, 1933 MRN: [id number]   Airway Documentation:    Vent end date: 05/27/24 Vent end time: 1245   Evaluation  O2 sats: stable throughout Complications: No apparent complications Patient did tolerate procedure well. Bilateral Breath Sounds: Clear   Yes  Pt extubated per order. Positive cuff leak noted prior to extubation. Vitals stable. RT will continue to monitor.  [name] 05/27/2024, 1:11 PM

## 2024-05-27 NOTE — TOC Progression Note (Addendum)
 Transition of Care [facility]) - Progression Note    Patient Details  Name: Jonathan Baird MRN: [id number] Date of Birth: 1933/06/12  Transition of [facility] CM/SW Contact  Tom-Johnson, Lakyla Biswas Daphne, RN Phone Number: 05/27/2024, 1:28 PM  Clinical Narrative:     Patient underwent one way extubation today. Palliative Care follows with GOC.   CM will continue to follow.        Expected Discharge Plan and Services                                               Social Determinants of Health (SDOH) Interventions SDOH Screenings   Food Insecurity: Patient Unable To Answer (05/22/2024)  Housing: Unknown (05/22/2024)  Transportation Needs: Patient Unable To Answer (05/22/2024)  Utilities: Patient Unable To Answer (05/22/2024)  Social Connections: Patient Unable To Answer (05/22/2024)  Tobacco Use: Low Risk  (05/20/2024)    Readmission Risk Interventions     No data to display

## 2024-05-27 NOTE — Progress Notes (Signed)
 Daughter [name] called this morning and stated her and her mom want to be here for patient's SBT. She said they will be here before 0900 today.

## 2024-05-27 NOTE — Progress Notes (Signed)
   Palliative Medicine Inpatient Follow Up Note HPI: 89 year old man who presented to [facility] ED 7/10 for respiratory distress. PMHx significant for HTN, HLD, T2DM, stage IV CKD, PVD, R ICA stenosis, colon CA s/p hemicolectomy, dementia. The Palliative care team as been asked to support additional goals of care conversations.   Today's Discussion 05/27/2024  *Please note that this is a verbal dictation therefore any spelling or grammatical errors are due to the Dragon Medical One system interpretation.  Chart reviewed inclusive of vital signs, progress notes, laboratory results, and diagnostic images.   I met with patients wife, Cy, and daughter, [name] this morning with Dr. Theophilus. We reviewed that Izzac has been passing his SBT's this morning.   Dr. Theophilus shares that we are in a window whereby we could pursue extubation. He shares the only caveat is that if Cantrell does not do well the medical team needs to know whether or not to re-intubate. Patients wife shares that she does not want Shaun to suffer. Dr. Theophilus explains that the ETT is uncomfortable and replacing it once removed would be even more uncomfortable. He shares that he recommends removing it and not replacing it if patient does poorly. He notes the hope and thought that Coye may fair well but the importance of understanding the what ifs.   We reviewed that if Harrie improves than this is great and what we hope for but if he declines than this is his bodies way of showing what it can and can't do on its own. If this were the reality the goal at that time would be geard towards comfort and symptom relief.   Plan for one way extubation today.  Questions and concerns addressed/Palliative Support Provided.   Objective Assessment: Vital Signs Vitals:   05/27/24 0726 05/27/24 0800  BP:  (!) 182/76  Pulse:    Resp:    Temp: 98 F (36.7 C)   SpO2:      Intake/Output Summary (Last 24 hours) at 05/27/2024 1118 Last data filed at  05/27/2024 0800 Gross per 24 hour  Intake 4627.15 ml  Output 2425 ml  Net 2202.15 ml   Last Weight  Most recent update: 05/27/2024  4:17 AM    Weight  63.9 kg (140 lb 14 oz)            Gen:  Elderly AA M chronically ill appearing HEENT: NGT, dry mucous membranes CV: Regular rate and rhythm  PULM:  On mechanical ventilator ABD: soft/nontender  EXT: No edema  Neuro: Opens eyes and follows some direction(s)  SUMMARY OF RECOMMENDATIONS   DNR - no compressions  Plan for one way extubation with no re-intubation  Allow time for outcomes - should Victory improve or worsen  If Abhinav were to decline would gear care towards keeping him comfortable    Ongoing PMT support ________________________________________________________________________ [name] [place] Palliative Medicine Team Team Cell Phone: [phone] Please utilize secure chat with additional questions, if there is no response within 30 minutes please call the above phone number  Billing based on MDM: High  Palliative Medicine Team providers are available by phone from 7am to 7pm daily and can be reached through the team cell phone.  Should this patient require assistance outside of these hours, please call the patient's attending physician.

## 2024-05-27 NOTE — Progress Notes (Signed)
 CBG 128 not uploading.

## 2024-05-27 NOTE — Progress Notes (Signed)
 NAME:  Jonathan Baird, MRN:  [id number], DOB:  May 05, 1933, LOS: 6 ADMISSION DATE:  05/20/2024  History of Present Illness:  89 year old man who presented to [facility] ED 7/10 for respiratory distress. PMHx significant for HTN, HLD, T2DM, stage IV CKD, PVD, R ICA stenosis, colon CA s/p hemicolectomy, dementia. Recent brief admission 2/17-2/18 for progressive weakness and urinary frequency. Recently treated presumed UTI.   Patient initially presented to ED from home for respiratory distress. Reportedly in his USOH 7/8PM, but developed SOB and wet cough 7/9. On ED arrival, patient was afebrile with temp 99.95F, HR 130, BP 150/90, SpO2 64% on RA, improved to 89% with nebulizer and 4LNC. DuoNebs, Solumedrol and Mg administered. GCS 10. CBG 468. Labs were notable for WBC 11.4, Hgb 11.3, Plt 320. Na 143, K 3.9, CO2 16, BUN/Cr 108/3.76 (baseline Cr 2.8-2.9). LFTs WNL. INR 1.2. Trop 37 > 45. BNP 22.1. RVP negative. UA > 500 glucose, otherwise unremarkable. Serum Osm 360. B-hydroxybutyrate 0.25. LA 7.8 > 9.6 > 7.9. CXR with scattered airspace disease (perihilar) c/f PNA; CT Head NAICA. CT Chest/A/P demonstrated multiple loops of SB with suspected transition point mid-pelvis, scattered bilateral airspace disease in the lungs c/w PNA, vague pancreatic head density. Insulin  gtt was started for hyperglycemia and broad-spectrum antibiotics initiated (Vanc/Zosyn ).    While awaiting bed placement in ED, patient had a witnessed seizure lasting about 2 minutes with L eye deviation. Neuro was consulted. Spot EEG demonstrated diffuse slowing without electrographic seizures. Patient remained particularly somnolent and was poorly protecting his airway with gurgling respirations and decision was made to intubate.   PCCM consulted for ICU admission.  Pertinent  Medical History  CAD, CKD stage IV, colon cancer s/p hemicolectomy, dementia, DM, HLD, HTN, PVD   Significant Hospital Events: Including procedures, antibiotic start and  stop dates in addition to other pertinent events   7/9 - Presented to ED with AMS, SOB. 7/10 - PCCM consulted for airway protection, intubated in ED. 7/11-sedation stopped for WUA 7/13-remains off sedation 7/15-palliative care met with patient.  Considering one-way extubation  Antibiotics Finished course of Zosyn  and doxycycline  on 7/15  Interim History / Subjective:  Palliative met with family Remains on the ventilator.  On SBT today  Objective    Blood pressure (!) 163/66, pulse 62, temperature 98 F (36.7 C), temperature source Axillary, resp. rate 16, height 5' 10 (1.778 m), weight 63.9 kg, SpO2 100%.    Vent Mode: PRVC FiO2 (%):  [30 %] 30 % Set Rate:  [16 bmp] 16 bmp Vt Set:  [580 mL] 580 mL PEEP:  [5 cmH20] 5 cmH20 Plateau Pressure:  [14 cmH20-17 cmH20] 17 cmH20   Intake/Output Summary (Last 24 hours) at 05/27/2024 0750 Last data filed at 05/27/2024 0600 Gross per 24 hour  Intake 4572.08 ml  Output 2425 ml  Net 2147.08 ml   Filed Weights   05/25/24 0411 05/26/24 0421 05/27/24 0416  Weight: 63 kg 66.2 kg 63.9 kg    Examination: Blood pressure (!) 182/76, pulse 62, temperature 98 F (36.7 C), temperature source Axillary, resp. rate 16, height 5' 10 (1.778 m), weight 63.9 kg, SpO2 100%. Gen:      No acute distress HEENT:  EOMI, sclera anicteric, ETT Neck:     No masses; no thyromegaly Lungs:    Clear to auscultation bilaterally; normal respiratory effort CV:         Regular rate and rhythm; no murmurs Abd:      + bowel sounds; soft, non-tender; no  palpable masses, no distension Ext:    No edema; adequate peripheral perfusion Neuro: Awake, follows commands  Lab/imaging reviewed Significant for sodium 147, BUN/creatinine 17/2.82 WBC 13.6, hemoglobin 8.0 No new imaging  Resolved problem list  SBO  Assessment and Plan  NEURO #Metabolic encephalopathy #Background dementia Off sedation since 7/11  #Concern for seizures S/p Keppra  load, did receive  Ativan .  Ongoing EEG did not reveal seizures, was discontinued 05/23/2024 --Currently off sedation and more interactive, follows verbal commands intermittently --Palliative following, pending further GOC conversation today   #Acute left frontal lobe infarct-2 mm MRA negative.  Stroke team signed off 7/12 - Remains on aspirin  81mg  daily -- no statin given low LDL - Neuroprotective measures  PULMONARY #Acute hypoxemic respiratory failure #Aspiration pneumonia -Continue broad-spectrum antibiotics -Zosyn  and doxycycline  -Continue mechanical ventilation -Target TVol 6-8cc/kgIBW -Target Plateau Pressure < 30cm H20 -Target driving pressure less than 15 cm of water  -Target PaO2 55-65: titrate PEEP/FiO2 per protocol -VAP prevention protocol -continue bronchodilators -CTM daily CBC differential   NEPHRO #Acute kidney injury - Avoid nephrotoxic medications - Continue to maintain renal perfusion -CTM with daily BMP   #Hypernatremia Improving to 147 -Continue 200 mL free water  every 4 hour -D5W as below  CV #HTN -- As needed hydro, labetalol  for goal SBP <160   FEN/GI  # SBO, resolved without surgical intervention (GS signed off) A1c 7.6, goal <7 per neuro given acute stroke - Continue tube feeds per RD - Moderate SSI -Thiamine  supplementation --D5W 100 mL/h   #History of colon cancer s/p hemicolectomy -- CTM  Best Practice (right click and Reselect all SmartList Selections daily)   Diet/type: tubefeeds DVT prophylaxis prophylactic heparin   Pressure ulcer(s): present on admission  GI prophylaxis: PPI Lines: N/A Foley:  Yes, and it is still needed Code Status:  DNR Last date of multidisciplinary goals of care discussion [7/13 with palliative, family desires ongoing treatment, PMT to have further discussions re: comfort measures]  Critical care time:    The patient is critically ill with multiple organ system failure and requires high complexity decision making for  assessment and support, frequent evaluation and titration of therapies, advanced monitoring, review of radiographic studies and interpretation of complex data.   Critical Care Time devoted to patient care services, exclusive of separately billable procedures, described in this note is 35 minutes.   Aimie Wagman MD [facility] See Amion for pager  If no response to pager , please call [phone] until 7pm After 7:00 pm call Elink  [phone] 05/27/2024, 8:16 AM

## 2024-05-28 ENCOUNTER — Inpatient Hospital Stay (HOSPITAL_COMMUNITY)

## 2024-05-28 DIAGNOSIS — G9341 Metabolic encephalopathy: Secondary | ICD-10-CM | POA: Diagnosis not present

## 2024-05-28 DIAGNOSIS — E870 Hyperosmolality and hypernatremia: Secondary | ICD-10-CM | POA: Diagnosis not present

## 2024-05-28 DIAGNOSIS — K56609 Unspecified intestinal obstruction, unspecified as to partial versus complete obstruction: Secondary | ICD-10-CM | POA: Diagnosis not present

## 2024-05-28 LAB — GLUCOSE, CAPILLARY
Glucose-Capillary: 122 mg/dL — ABNORMAL HIGH (ref 70–99)
Glucose-Capillary: 126 mg/dL — ABNORMAL HIGH (ref 70–99)
Glucose-Capillary: 132 mg/dL — ABNORMAL HIGH (ref 70–99)
Glucose-Capillary: 148 mg/dL — ABNORMAL HIGH (ref 70–99)
Glucose-Capillary: 161 mg/dL — ABNORMAL HIGH (ref 70–99)
Glucose-Capillary: 170 mg/dL — ABNORMAL HIGH (ref 70–99)

## 2024-05-28 LAB — CBC WITH DIFFERENTIAL/PLATELET
Abs Immature Granulocytes: 0.2 K/uL — ABNORMAL HIGH (ref 0.00–0.07)
Basophils Absolute: 0 K/uL (ref 0.0–0.1)
Basophils Relative: 0 %
Eosinophils Absolute: 0.2 K/uL (ref 0.0–0.5)
Eosinophils Relative: 1 %
HCT: 24.5 % — ABNORMAL LOW (ref 39.0–52.0)
Hemoglobin: 8.1 g/dL — ABNORMAL LOW (ref 13.0–17.0)
Immature Granulocytes: 2 %
Lymphocytes Relative: 11 %
Lymphs Abs: 1.3 K/uL (ref 0.7–4.0)
MCH: 25.9 pg — ABNORMAL LOW (ref 26.0–34.0)
MCHC: 33.1 g/dL (ref 30.0–36.0)
MCV: 78.3 fL — ABNORMAL LOW (ref 80.0–100.0)
Monocytes Absolute: 0.5 K/uL (ref 0.1–1.0)
Monocytes Relative: 4 %
Neutro Abs: 10.5 K/uL — ABNORMAL HIGH (ref 1.7–7.7)
Neutrophils Relative %: 82 %
Platelets: 228 K/uL (ref 150–400)
RBC: 3.13 MIL/uL — ABNORMAL LOW (ref 4.22–5.81)
RDW: 16 % — ABNORMAL HIGH (ref 11.5–15.5)
WBC: 12.8 K/uL — ABNORMAL HIGH (ref 4.0–10.5)
nRBC: 0 % (ref 0.0–0.2)

## 2024-05-28 LAB — BASIC METABOLIC PANEL WITH GFR
Anion gap: 10 (ref 5–15)
BUN: 70 mg/dL — ABNORMAL HIGH (ref 8–23)
CO2: 23 mmol/L (ref 22–32)
Calcium: 8.8 mg/dL — ABNORMAL LOW (ref 8.9–10.3)
Chloride: 111 mmol/L (ref 98–111)
Creatinine, Ser: 2.59 mg/dL — ABNORMAL HIGH (ref 0.61–1.24)
GFR, Estimated: 23 mL/min — ABNORMAL LOW (ref >60)
Glucose, Bld: 211 mg/dL — ABNORMAL HIGH (ref 70–99)
Potassium: 4.1 mmol/L (ref 3.5–5.1)
Sodium: 144 mmol/L (ref 135–145)

## 2024-05-28 MED ORDER — LABETALOL HCL 100 MG PO TABS
100.0000 mg | ORAL_TABLET | Freq: Two times a day (BID) | ORAL | Status: DC
  Administered 2024-05-28 – 2024-06-01 (×9): 100 mg
  Filled 2024-05-28 (×11): qty 1

## 2024-05-28 NOTE — Evaluation (Signed)
 Occupational Therapy Evaluation Patient Details Name: Jonathan Baird MRN: [id number] DOB: [birth year] Today's Date: 05/28/2024   History of Present Illness   89 year old man who presented to [facility] ED 7/9 for respiratory distress. 7/10 Noted to have seizure in ED, and required intubation. MRI revealed 2 mm acute infarct within the left frontal lobe One way extubation 7/16 PMHx significant for HTN, HLD, T2DM, stage IV CKD, PVD, R ICA stenosis, colon CA s/p hemicolectomy, dementia     Clinical Impressions Pt lives with his wife and daughter. He walks with a RW or with assist of his daughter in tight spaces. Pt is assisted for sponge bathing seated on 3 in 1 over toilet and assisted with toileting and dressing. He is able to self feed and groom with set up. Pt presents with generalized weakness and zero sitting balance. He needs total assist for bed level mobility. Pt currently with NG feeding tube. He is dependent in all ADLs. He follows simple one step commands consistently. He is minimally verbal. His family reports he also does not speak much at home. Patient will benefit from continued inpatient follow up therapy, <3 hours/day.      If plan is discharge home, recommend the following:   Two people to help with walking and/or transfers;Two people to help with bathing/dressing/bathroom;Assistance with cooking/housework;Assistance with feeding;Direct supervision/assist for medications management;Direct supervision/assist for financial management;Assist for transportation;Help with stairs or ramp for entrance     Functional Status Assessment   Patient has had a recent decline in their functional status and demonstrates the ability to make significant improvements in function in a reasonable and predictable amount of time.     Equipment Recommendations   Other (comment) (defer)     Recommendations for Other Services         Precautions/Restrictions   Precautions Precautions:  Fall Recall of Precautions/Restrictions: Impaired Precaution/Restrictions Comments: NG feeding tube Restrictions Weight Bearing Restrictions Per Provider Order: No     Mobility Bed Mobility Overal bed mobility: Needs Assistance Bed Mobility: Rolling, Sidelying to Sit, Sit to Supine Rolling: Total assist Sidelying to sit: Total assist   Sit to supine: Total assist   General bed mobility comments: uses rail with hand placed, assist for all aspects, decreased initiation    Transfers                   General transfer comment: deferred      Balance Overall balance assessment: Needs assistance   Sitting balance-Leahy Scale: Zero                                     ADL either performed or assessed with clinical judgement   ADL                                         General ADL Comments: NG fed, otherwise dependent     Vision   Additional Comments: needs further assessment, tends to keep head toward R where family was seated, able to look L upon request     Perception Perception: Not tested       Praxis Praxis: Not tested       Pertinent Vitals/Pain Pain Assessment Pain Assessment: Faces Faces Pain Scale: No hurt     Extremity/Trunk Assessment Upper Extremity Assessment Upper Extremity Assessment: Generalized  weakness;Right hand dominant   Lower Extremity Assessment Lower Extremity Assessment: Defer to PT evaluation   Cervical / Trunk Assessment Cervical / Trunk Assessment: Kyphotic;Other exceptions (weakness)   Communication Communication Communication: Impaired Factors Affecting Communication: Hearing impaired;Difficulty expressing self;Reduced clarity of speech   Cognition Arousal: Alert Behavior During Therapy: Flat affect (smiling at times at his family)           Attention impairment (select first level of impairment): Sustained attention Executive functioning impairment (select all impairments):  Initiation, Sequencing OT - Cognition Comments: difficult to assess, minimally conversant                 Following commands: Impaired Following commands impaired: Follows one step commands with increased time, Only follows one step commands consistently     Cueing  General Comments   Cueing Techniques: Verbal cues;Gestural cues;Tactile cues;Visual cues      Exercises     Shoulder Instructions      Home Living Family/patient expects to be discharged to:: Private residence Living Arrangements: Spouse/significant other;Children Available Help at Discharge: Family;Available 24 hours/day Type of Home: House Home Access: Stairs to enter [employer] of Steps: 2-3 Entrance Stairs-Rails: None Home Layout: One level     Bathroom Shower/Tub: Chief Strategy Officer: Standard     Home Equipment: Shower seat;Cane - single Librarian, academic (2 wheels);BSC/3in1   Additional Comments: home set up per family      Prior Functioning/Environment Prior Level of Function : Needs assist             Mobility Comments: walked with RW except in the bathroom where his daughter walks behind him due to narrow doorway ADLs Comments: self feeds with setup and participates in grooming, sponge bathes and dresses on 3 in 1 over toilet with daughter's assist, dependent in IADLs    OT Problem List: Decreased strength;Impaired balance (sitting and/or standing);Decreased cognition;Impaired UE functional use   OT Treatment/Interventions: Self-care/ADL training;DME and/or AE instruction;Therapeutic activities;Patient/family education;Balance training;Cognitive remediation/compensation      OT Goals(Current goals can be found in the care plan section)   Acute Rehab OT Goals OT Goal Formulation: With family Time For Goal Achievement: 06/11/24 Potential to Achieve Goals: Fair ADL Goals Pt Will Perform Grooming: with min assist;sitting Pt Will Perform Upper Body  Dressing: with min assist;sitting Pt/caregiver will Perform Home Exercise Program: Increased strength;Both right and left upper extremity;With minimal assist Additional ADL Goal #1: Pt will complete bed mobility with moderate assistance in preparation for ADLs. Additional ADL Goal #2: Pt will demonstrate fair sitting balance as a precursor to ADLs.   OT Frequency:  Min 2X/week    Co-evaluation              AM-PAC OT 6 Clicks Daily Activity     Outcome Measure Help from another person eating meals?: Total Help from another person taking care of personal grooming?: A Lot Help from another person toileting, which includes using toliet, bedpan, or urinal?: Total Help from another person bathing (including washing, rinsing, drying)?: Total Help from another person to put on and taking off regular upper body clothing?: Total Help from another person to put on and taking off regular lower body clothing?: Total 6 Click Score: 7   End of Session    Activity Tolerance: Patient tolerated treatment well Patient left: in bed;with bed alarm set;with family/visitor present;with call bell/phone within reach  OT Visit Diagnosis: Muscle weakness (generalized) (M62.81);Other symptoms and signs involving cognitive function  Time: 8553-8479 OT Time Calculation (min): 34 min Charges:  OT General Charges $OT Visit: 1 Visit OT Evaluation $OT Eval Moderate Complexity: 1 Mod OT Treatments $Therapeutic Activity: 8-22 mins  Mliss HERO, OTR/L Acute Rehabilitation Services Office: [phone]   Kennth Mliss Helling 05/28/2024, 3:28 PM

## 2024-05-28 NOTE — Progress Notes (Signed)
 Nutrition Follow-up  DOCUMENTATION CODES:   Severe malnutrition in context of chronic illness (dementia, CKD IV)  INTERVENTION:  Continue tube feeding via NGT: Vital 1.5 at 45 ml/h (1080 ml per day) free water  q4h Provides 1620 kcal, 72 gm protein, 825 ml free water  daily (TF+flush = free water ) Monitor ability to advance diet via SLP recommendations 1 packet Juven BID, each packet provides 95 calories, 2.5 grams of protein (collagen) + micronutrients to support wound healing  NUTRITION DIAGNOSIS:   Severe Malnutrition related to chronic illness (dementia, CKD IV) as evidenced by severe muscle depletion, severe fat depletion. - remains applicable  GOAL:   Patient will meet greater than or equal to 90% of their needs - goal met via TF  MONITOR:   Vent status, Diet advancement  REASON FOR ASSESSMENT:   Ventilator    ASSESSMENT:   Pt with hx HTN, HLD, dementia, type 2 diabetes, CKD IV, and PVD. Hx of colon cancer s/p hemicolectomy (2005). Pt admitted from home for acute hypoxemic respiratory failure with a witnessed seizure in the ED. Pt required intubation for airway protection.  7/9 - presented to ED, imaging concerning for SBO, NGT place for decompression 7/10 - intubated 7/11 - trickle feeds initiated 7/16 - extubated  Checked in with patient at bedside. He is a limited historian. Denies abdominal pain or nausea. TF infusing via NGT at goal rate.  Noted plan for Cortrak placement tomorrow.  Discussed with SLP outside room following bedside swallow assessment. Suspect a degree of underlying dysphagia. Difficulty with secretions limiting ability to adequately assess swallow safety. May consider instrumental/further swallow assessment tomorrow.   PMT following for ongoing GOC.  One-way extubation yesterday and allow time for outcomes.   Admit weight: 65 kg Current weight: 61.2 kg  Drains/lines: UOP: x24 hours   Medications: colace BID, SSI 0-15  units q4h, semglee  10 units daily, miralax   Labs:  BUN 70 Cr 2.59 GFR 23 CBG's 131-177 x24 hours  Diet Order:   Diet Order             Diet NPO time specified  Diet effective now                   EDUCATION NEEDS:   Not appropriate for education at this time  Skin:  Skin Assessment: Skin Integrity Issues: Skin Integrity Issues:: DTI DTI: R heel, L heel Stage II: buttocks  Last BM:  7/17 x2, type 6/type 7 large  Height:   Ht Readings from Last 1 Encounters:  05/27/24 5' 10 (1.778 m)    Weight:   Wt Readings from Last 1 Encounters:  05/28/24 61.2 kg    Ideal Body Weight:  75.5 kg  BMI:  Body mass index is 19.36 kg/m.  Estimated Nutritional Needs:   Kcal:  1600-1800  Protein:  75-90g  Fluid:  1.6-1.8L  Royce Maris, RDN, LDN Clinical Nutrition See AMiON for contact information.

## 2024-05-28 NOTE — Evaluation (Signed)
 Speech Language Pathology Evaluation Patient Details Name: VIRGINIA FRANCISCO MRN: [id number] DOB: 1933/04/23 Today's Date: 05/28/2024 Time: 0920-0930 SLP Time Calculation (min) (ACUTE ONLY): 10 min  Problem List:  Patient Active Problem List   Diagnosis Date Noted   Small bowel obstruction (HCC) 05/21/2024   Acute hypoxemic respiratory failure (HCC) 05/21/2024   Seizure-like activity (HCC) 05/21/2024   Multifocal pneumonia 05/21/2024   Acute respiratory failure with hypoxia (HCC) 05/21/2024   Protein-calorie malnutrition, severe 05/21/2024   Generalized weakness 12/31/2023   History of dementia 12/31/2023   CKD (chronic kidney disease), stage IV (HCC) 12/31/2023   History of carotid artery stenosis 12/31/2023   History of colon cancer status post colectomy 12/31/2023   Hyperkalemia 12/31/2023   Urinary hesitancy 12/31/2023   Syncope 10/19/2023   CAP (community acquired pneumonia) 10/19/2023   Acute renal failure superimposed on stage 4 chronic kidney disease (HCC) 10/19/2023   Acute encephalopathy 10/19/2023   Elevated troponin 10/19/2023   Insulin  dependent type 2 diabetes mellitus (HCC) 10/19/2023   History of vitrectomy 04/30/2022   Abnormal gait 02/13/2022   Altered mental status 02/13/2022   Unspecified visual loss 02/13/2022   Stage 3b chronic kidney disease (HCC) 09/25/2021   Stenosis of carotid artery 09/25/2021   Essential hypertension 01/17/2021   Chronic kidney disease due to hypertension 01/17/2021   Diabetic renal disease (HCC) 01/17/2021   Diabetic retinopathy associated with type 2 diabetes mellitus (HCC) 01/17/2021   Disorder of arteries and arterioles, unspecified (HCC) 01/17/2021   Drug-induced hypoglycemia without coma 01/17/2021   Adult general medical exam 01/17/2021   Hyperglycemia due to type 2 diabetes mellitus (HCC) 01/17/2021   Long term (current) use of insulin  (HCC) 01/17/2021   Hyperlipidemia 01/17/2021   PVD (peripheral vascular disease) (HCC)  01/17/2021   Controlled diabetes mellitus with stable proliferative retinopathy of both eyes (HCC) 08/10/2020   Right epiretinal membrane 08/10/2020   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 degeneration of both eyes with subfoveal involvement 08/10/2020   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/10/2020   Colon cancer (HCC) 10/12/2004   Past Medical History:  Past Medical History:  Diagnosis Date   Carotid artery stenosis    Right   CKD (chronic kidney disease), stage IV (HCC)    Colon cancer (HCC) 10/12/2004   stage III, s/p hemicolectomy   Dementia (HCC)    Diabetes mellitus    HLD (hyperlipidemia)    HTN (hypertension)    Peripheral vascular disease (HCC)    Past Surgical History:  Past Surgical History:  Procedure Laterality Date   hemocolectomy  11/13/2003   HPI:  89 year old man who presented to [facility] ED 7/10 for respiratory distress. While awaiting bed placement in ED, patient had a witnessed seizure lasting about 2 minutes with L eye deviation.  Patient remained particularly somnolent and was poorly protecting his airway with gurgling respirations and decision was made to intubate 7/9-7/16 (one way extubation). Pt additionally diagnosed with acute left frontal lobe CVA, SBO that resolved , and aspiration pna. CT on 7/9 (day of admission) showed Scattered airspace opacities are noted in the right lung with consolidation in the right upper and lower lobes. There is consolidation in the left lower lobe with mild airspace disease in  the left upper lobe.  And dilated, fluid filled esophagus. PMHx significant for HTN, HLD, T2DM, stage IV CKD, PVD, R ICA stenosis, colon CA s/p hemicolectomy, dementia.   Assessment / Plan / Recommendation Clinical Impression  Pt is awake, initially a bit lethargic, but woke  up more with activity. Consisntely able to follow one step commands, respond to basic biographical questions with Y/N and some one word elaboration about this family and background.  Often pt did not respond to open ended questions. He was not able to name his daughters or this hospital. He did count to 10 with only mild repetition error. Not significantly dysarthric; pt has a [place]  accent and confirms he is from [place] . Pt able to sustain attention to holding a cup and taking a sip, but otherwise did not initiate any movement or activity. Poor core and neck position. though no specific inattention observed. Will f/u to target speech and language while appropriate.    SLP Assessment  SLP Visit Diagnosis: Dysphagia, unspecified (R13.10)     Assistance Recommended at Discharge     Functional Status Assessment Patient has had a recent decline in their functional status and demonstrates the ability to make significant improvements in function in a reasonable and predictable amount of time.  Frequency and Duration min 2x/week         SLP Evaluation Cognition  Overall Cognitive Status: Impaired/Different from baseline Arousal/Alertness: Awake/alert Orientation Level: Oriented to person;Oriented to place;Disoriented to time;Disoriented to situation Attention: Focused;Sustained Focused Attention: Appears intact Sustained Attention: Impaired Sustained Attention Impairment: Verbal basic;Functional basic Memory: Impaired       Comprehension  Auditory Comprehension Overall Auditory Comprehension: Impaired Yes/No Questions: Within Functional Limits Commands: Within Functional Limits Conversation: Simple Interfering Components: Attention    Expression Verbal Expression Overall Verbal Expression: Impaired Initiation: No impairment Automatic Speech: Name;Counting;Social Response Level of Generative/Spontaneous Verbalization: Word Repetition: No impairment Naming: Impairment Responsive: 26-50% accurate Confrontation: Not tested   Oral / Motor  Oral Motor/Sensory Function Overall Oral Motor/Sensory Function: Within functional limits Motor Speech Overall Motor  Speech: Appears within functional limits for tasks assessed            Marijose Curington, Consuelo Fitch 05/28/2024, 10:55 AM

## 2024-05-28 NOTE — Progress Notes (Signed)
 NAME:  Jonathan Baird, MRN:  [id number], DOB:  [birth year], LOS: 7 ADMISSION DATE:  05/20/2024  History of Present Illness:  89 year old man who presented to [facility] ED 7/10 for respiratory distress. PMHx significant for HTN, HLD, T2DM, stage IV CKD, PVD, R ICA stenosis, colon CA s/p hemicolectomy, dementia. Recent brief admission 2/17-2/18 for progressive weakness and urinary frequency. Recently treated presumed UTI.   Patient initially presented to ED from home for respiratory distress. Reportedly in his USOH 7/8PM, but developed SOB and wet cough 7/9. On ED arrival, patient was afebrile with temp 99.83F, HR 130, BP 150/90, SpO2 64% on RA, improved to 89% with nebulizer and 4LNC. DuoNebs, Solumedrol and Mg administered. GCS 10. CBG 468. Labs were notable for WBC 11.4, Hgb 11.3, Plt 320. Na 143, K 3.9, CO2 16, BUN/Cr 108/3.76 (baseline Cr 2.8-2.9). LFTs WNL. INR 1.2. Trop 37 > 45. BNP 22.1. RVP negative. UA > 500 glucose, otherwise unremarkable. Serum Osm 360. B-hydroxybutyrate 0.25. LA 7.8 > 9.6 > 7.9. CXR with scattered airspace disease (perihilar) c/f PNA; CT Head NAICA. CT Chest/A/P demonstrated multiple loops of SB with suspected transition point mid-pelvis, scattered bilateral airspace disease in the lungs c/w PNA, vague pancreatic head density. Insulin  gtt was started for hyperglycemia and broad-spectrum antibiotics initiated (Vanc/Zosyn ).    While awaiting bed placement in ED, patient had a witnessed seizure lasting about 2 minutes with L eye deviation. Neuro was consulted. Spot EEG demonstrated diffuse slowing without electrographic seizures. Patient remained particularly somnolent and was poorly protecting his airway with gurgling respirations and decision was made to intubate.   PCCM consulted for ICU admission.  Pertinent  Medical History  CAD, CKD stage IV, colon cancer s/p hemicolectomy, dementia, DM, HLD, HTN, PVD   Significant Hospital Events: Including procedures, antibiotic start and  stop dates in addition to other pertinent events   7/9 - Presented to ED with AMS, SOB. 7/10 - PCCM consulted for airway protection, intubated in ED. 7/11-sedation stopped for WUA 7/13-remains off sedation 7/15-palliative care met with patient.  Considering one-way extubation  Antibiotics Finished course of Zosyn  and doxycycline  on 7/15  Interim History / Subjective:  Palliative care notes reviewed. One-way extubation yesterday. Resp status stable overnight. Looks good this morning on RA.   Objective    Blood pressure (!) 163/60, pulse 71, temperature 98.1 F (36.7 C), temperature source Axillary, resp. rate (!) 26, height 5' 10 (1.778 m), weight 61.2 kg, SpO2 97%.        Intake/Output Summary (Last 24 hours) at 05/28/2024 0902 Last data filed at 05/28/2024 0600 Gross per 24 hour  Intake 2168 ml  Output 2875 ml  Net -707 ml   Filed Weights   05/26/24 0421 05/27/24 0416 05/28/24 0422  Weight: 66.2 kg 63.9 kg 61.2 kg    Examination: Gen:      Chronically ill appearing elderly male, No acute distress HEENT:  mm moist, no JVD, NG tube  Neck:     No masses; no thyromegaly Lungs:    resps even non labored on RA, few scattered rhonchi  CV:         Regular rate and rhythm; no murmurs Abd:      Soft, non tender, tol TF Ext:    No edema; adequate peripheral perfusion Neuro:  awake, follows commands, weak voice   Lab/imaging reviewed Na improving  Scr improving  Anemic but hgb stable   Resolved problem list  SBO  Assessment and Plan  NEURO #Metabolic encephalopathy #Background  dementia  #Acute hypoxemic respiratory failure #Aspiration pneumonia -s/p 5 day course zosyn /doxy  -pulm hygiene, mobilize as able  - f/u CXR  -continue bronchodilators -trend WBC  -swallow eval   #Concern for seizures S/p Keppra  load, did receive Ativan .  Ongoing EEG did not reveal seizures, was discontinued 05/23/2024 --Currently off sedation and more interactive, follows verbal commands  intermittently --ongoing GOC discussions    #Acute left frontal lobe infarct-2 mm MRA negative.  Stroke team signed off 7/12 - Remains on aspirin  81mg  daily - no statin given low LDL - Neuroprotective measures   #Acute kidney injury - improving  - Avoid nephrotoxic medications - Continue to maintain renal perfusion - CTM with daily BMP   #Hypernatremia - improving  -Continue 200 mL free water  every 4 hour   #HTN - BP creeping up  -amlodipine   -add scheduled labetalol   -PRN hydralazine    # SBO, resolved without surgical intervention (GS signed off) A1c 7.6, goal <7 per neuro given acute stroke - Continue tube feeds per RD - Moderate SSI -Thiamine  supplementation -speech for swallow eval post extubation - plan for coretrak if needed   #History of colon cancer s/p hemicolectomy -- CTM  Best Practice (right click and Reselect all SmartList Selections daily)   Diet/type: tubefeeds DVT prophylaxis prophylactic heparin   Pressure ulcer(s): present on admission  GI prophylaxis: PPI Lines: N/A Foley:  Yes, and it is still needed Code Status:  DNR Last date of multidisciplinary goals of care discussion [7/16 with palliative, Dr. Theophilus ]   Can tx out of ICU today. Will ask TRH to assume care 7/18  Rockie Myers, NP Pulmonary/Critical Care Medicine  05/28/2024  9:02 AM   See Amion for personal pager PCCM on call pager [phone] until 7pm. Please call Elink 7p-7a. [phone]

## 2024-05-28 NOTE — Evaluation (Signed)
 Clinical/Bedside Swallow Evaluation Patient Details  Name: Jonathan Baird MRN: [id number] Date of Birth: August 23, 1933  Today's Date: 05/28/2024 Time: SLP Start Time (ACUTE ONLY): 0920 SLP Stop Time (ACUTE ONLY): 0950 SLP Time Calculation (min) (ACUTE ONLY): 30 min  Past Medical History:  Past Medical History:  Diagnosis Date   Carotid artery stenosis    Right   CKD (chronic kidney disease), stage IV (HCC)    Colon cancer (HCC) 10/12/2004   stage III, s/p hemicolectomy   Dementia (HCC)    Diabetes mellitus    HLD (hyperlipidemia)    HTN (hypertension)    Peripheral vascular disease (HCC)    Past Surgical History:  Past Surgical History:  Procedure Laterality Date   hemocolectomy  11/13/2003   HPI:  89 year old man who presented to [facility] ED 7/10 for respiratory distress. While awaiting bed placement in ED, patient had a witnessed seizure lasting about 2 minutes with L eye deviation.  Patient remained particularly somnolent and was poorly protecting his airway with gurgling respirations and decision was made to intubate 7/9-7/16 (one way extubation). Pt additionally diagnosed with acute left frontal lobe CVA, SBO that resolved , and aspiration pna. CT on 7/9 (day of admission) showed Scattered airspace opacities are noted in the right lung with consolidation in the right upper and lower lobes. There is consolidation in the left lower lobe with mild airspace disease in  the left upper lobe.  And dilated, fluid filled esophagus. PMHx significant for HTN, HLD, T2DM, stage IV CKD, PVD, R ICA stenosis, colon CA s/p hemicolectomy, dementia.    Assessment / Plan / Recommendation  Clinical Impression  Pt demonstrates concern for aspiration, baseline dysphagia and poor cough mechanics and airway sensation. Pt has wet vocal quality that never fully cleared despite cues for throat clears and swallows. Cough is not strong enough to orally expecorate secretions and pts seems to fatigue with attempts. He  is attentive to PO and initaites swallows subjectively, but would have expected a cough response following trials that did not occur, reinforcing concern for silent aspiration. Upon admission, pt had a CT scan concerning for aspiration pna and esophageal dysphagia. When asked, pt repeatedly confirms he was having trouble swallowing prior to admit.   For now, pt to remain NPO with ice chips from RN as tolerated to encourage thinning of secretions and swallowing. Will reassess tomorrow; if plan of care is to treat the treatable, pt will ultimately need instrumental assessment. If plan would not allow for a potentially two week period or more of hospitalization with temporary alternate method of nutrition, then swallowing testing and dysphagia treatment may not be appropriate. Suspect aspiration risk and poor access to nutrition will persist in this pt for some time, if not permanently based on initial observations though further objective assessment needed.  SLP Visit Diagnosis: Dysphagia, unspecified (R13.10)    Aspiration Risk  Severe aspiration risk;Risk for inadequate nutrition/hydration    Diet Recommendation NPO;Alternative means - temporary    Medication Administration: Via alternative means Postural Changes: Seated upright at 90 degrees    Other  Recommendations Oral Care Recommendations: Oral care QID;Oral care prior to ice chip/H20 Caregiver Recommendations: Have oral suction available     Assistance Recommended at Discharge    Functional Status Assessment Patient has had a recent decline in their functional status and demonstrates the ability to make significant improvements in function in a reasonable and predictable amount of time.  Frequency and Duration min 2x/week  2 weeks  Prognosis Prognosis for improved oropharyngeal function: Guarded Barriers to Reach Goals: Severity of deficits      Swallow Study   General HPI: 89 year old man who presented to [facility] ED 7/10 for  respiratory distress. While awaiting bed placement in ED, patient had a witnessed seizure lasting about 2 minutes with L eye deviation.  Patient remained particularly somnolent and was poorly protecting his airway with gurgling respirations and decision was made to intubate 7/9-7/16 (one way extubation). Pt additionally diagnosed with acute left frontal lobe CVA, SBO that resolved , and aspiration pna. CT on 7/9 (day of admission) showed Scattered airspace opacities are noted in the right lung with consolidation in the right upper and lower lobes. There is consolidation in the left lower lobe with mild airspace disease in  the left upper lobe.  And dilated, fluid filled esophagus. PMHx significant for HTN, HLD, T2DM, stage IV CKD, PVD, R ICA stenosis, colon CA s/p hemicolectomy, dementia. Type of Study: Bedside Swallow Evaluation Previous Swallow Assessment: none in chart Diet Prior to this Study: NPO;Large bore NG tube Temperature Spikes Noted: No Respiratory Status: Room air History of Recent Intubation: Yes Total duration of intubation (days): 8 days Date extubated: 05/27/24 Behavior/Cognition: Requires cueing Oral Cavity Assessment: Within Functional Limits Oral Care Completed by SLP: No Oral Cavity - Dentition: Missing dentition Patient Positioning: Postural control interferes with function Baseline Vocal Quality: Wet;Low vocal intensity Volitional Cough: Weak;Wet Volitional Swallow: Able to elicit    Oral/Motor/Sensory Function Overall Oral Motor/Sensory Function: Within functional limits   Ice Chips Ice chips: Impaired Presentation: Spoon Pharyngeal Phase Impairments: Wet Vocal Quality   Thin Liquid Thin Liquid: Impaired Presentation: Straw Pharyngeal  Phase Impairments: Multiple swallows;Wet Vocal Quality;Throat Clearing - Immediate    Nectar Thick Nectar Thick Liquid: Not tested   Honey Thick Honey Thick Liquid: Not tested   Puree Puree: Not tested   Solid             Shakari Qazi, Consuelo Fitch 05/28/2024,10:47 AM

## 2024-05-28 NOTE — Evaluation (Signed)
 Physical Therapy Evaluation Patient Details Name: Jonathan Baird MRN: [id number] DOB: 1933/07/29 Today's Date: 05/28/2024  History of Present Illness  89 year old man who presented to [facility] ED 7/9 for respiratory distress. 7/10 Noted to have seizure in ED, and required intubation. MRI revealed 2 mm acute infarct within the left frontal lobe One way extubation 7/16 PMHx significant for HTN, HLD, T2DM, stage IV CKD, PVD, R ICA stenosis, colon CA s/p hemicolectomy, dementia  Clinical Impression  Pt only able to answer yes/no questions about immediate situation. No family present during session. Per prior notes pt is living with spouse and family was assisting with mobilization ADLs and iADLs. Pt is currently able to move all 4 extremities to command. LE AAROM WFL, generalized weakness but strength in R LE>L LE. Pt is currently total A for bed mobility and transfer to sitting EOB, where he requires assistance to maintain seated balance. Patient will benefit from continued inpatient follow up therapy, <3 hours/day. PT will continue to follow acutely.   `       If plan is discharge home, recommend the following: Two people to help with walking and/or transfers;Two people to help with bathing/dressing/bathroom;Assistance with cooking/housework;Assistance with feeding;Direct supervision/assist for medications management;Direct supervision/assist for financial management;Assist for transportation;Help with stairs or ramp for entrance;Supervision due to cognitive status   Can travel by private vehicle   No    Equipment Recommendations Hospital bed;Hoyer lift;Wheelchair (measurements PT);Wheelchair cushion (measurements PT);BSC/3in1     Functional Status Assessment Patient has had a recent decline in their functional status and demonstrates the ability to make significant improvements in function in a reasonable and predictable amount of time.     Precautions / Restrictions Precautions Precautions:  Fall Precaution/Restrictions Comments: NG tube Restrictions Weight Bearing Restrictions Per Provider Order: No      Mobility  Bed Mobility Overal bed mobility: Needs Assistance Bed Mobility: Supine to Sit, Sit to Supine     Supine to sit: Total assist, Used rails, HOB elevated Sit to supine: Total assist   General bed mobility comments: pt able to follow commands for movement of LE towards EOB, reaching towards bed rail, but ultimately requires total A to achieve coming to seated EoB, and for return to supine in bed    Transfers                   General transfer comment: deferred        Balance Overall balance assessment: Needs assistance Sitting-balance support: Feet supported, Bilateral upper extremity supported Sitting balance-Leahy Scale: Zero Sitting balance - Comments: requires outside assist to maintain balance Postural control: Posterior lean                                   Pertinent Vitals/Pain Pain Assessment Pain Assessment: No/denies pain    Home Living Family/patient expects to be discharged to:: Private residence Living Arrangements: Spouse/significant other;Children Available Help at Discharge: Family;Available 24 hours/day Type of Home: House Home Access: Stairs to enter Entrance Stairs-Rails: None Entrance Stairs-Number of Steps: 2-3   Home Layout: One level Home Equipment: Shower seat;Cane - single Librarian, academic (2 wheels) Additional Comments: Home set up from prior hospitalization    Prior Function Prior Level of Function : Patient poor historian/Family not available                     Extremity/Trunk Assessment   Upper Extremity Assessment  Upper Extremity Assessment: Defer to OT evaluation    Lower Extremity Assessment Lower Extremity Assessment: RLE deficits/detail;LLE deficits/detail RLE Deficits / Details: able to move hip, knee and ankle with minor assist with ROM WFL. LLE Deficits /  Details: able to move hip, knee and ankle with minor assist with ROM WFL. L LE weaker than R LE    Cervical / Trunk Assessment Cervical / Trunk Assessment: Kyphotic  Communication   Communication Communication: Impaired Factors Affecting Communication: Hearing impaired    Cognition Arousal: Alert Behavior During Therapy: Flat affect   PT - Cognitive impairments: No family/caregiver present to determine baseline                       PT - Cognition Comments: able to nod to yes/no questions, mostly to the affirmative Following commands: Impaired Following commands impaired: Follows one step commands with increased time, Only follows one step commands consistently     Cueing Cueing Techniques: Verbal cues, Gestural cues, Tactile cues, Visual cues     General Comments General comments (skin integrity, edema, etc.): BP supine 155/67 in sitting 138/68, HR in 60s throughout session, SpO2 >90%O2 on RA        Assessment/Plan    PT Assessment Patient needs continued PT services  PT Problem List Decreased strength;Decreased activity tolerance;Decreased balance;Decreased mobility;Decreased coordination;Decreased cognition;Decreased safety awareness       PT Treatment Interventions DME instruction;Gait training;Functional mobility training;Therapeutic activities;Therapeutic exercise;Balance training;Neuromuscular re-education;Cognitive remediation;Wheelchair mobility training    PT Goals (Current goals can be found in the Care Plan section)  Acute Rehab PT Goals PT Goal Formulation: Patient unable to participate in goal setting Time For Goal Achievement: 06/11/24 Potential to Achieve Goals: Fair    Frequency Min 1X/week        AM-PAC PT 6 Clicks Mobility  Outcome Measure Help needed turning from your back to your side while in a flat bed without using bedrails?: Total Help needed moving from lying on your back to sitting on the side of a flat bed without using  bedrails?: Total Help needed moving to and from a bed to a chair (including a wheelchair)?: Total Help needed standing up from a chair using your arms (e.g., wheelchair or bedside chair)?: Total Help needed to walk in hospital room?: Total Help needed climbing 3-5 steps with a railing? : Total 6 Click Score: 6    End of Session   Activity Tolerance: Patient limited by fatigue Patient left: in bed;with call bell/phone within reach;with bed alarm set Nurse Communication: Mobility status PT Visit Diagnosis: Muscle weakness (generalized) (M62.81);Unsteadiness on feet (R26.81);Difficulty in walking, not elsewhere classified (R26.2);Other symptoms and signs involving the nervous system (R29.898);Adult, failure to thrive (R62.7)    Time: 1026-1048 PT Time Calculation (min) (ACUTE ONLY): 22 min   Charges:   PT Evaluation $PT Eval Moderate Complexity: 1 Mod   PT General Charges $$ ACUTE PT VISIT: 1 Visit         Terrika Zuver B. Fleeta Lapidus PT, DPT Acute Rehabilitation Services Please use secure chat or  Call Office [phone]   Almarie KATHEE Fleeta [facility] 05/28/2024, 11:17 AM

## 2024-05-29 ENCOUNTER — Inpatient Hospital Stay (HOSPITAL_COMMUNITY)

## 2024-05-29 DIAGNOSIS — R1310 Dysphagia, unspecified: Secondary | ICD-10-CM

## 2024-05-29 DIAGNOSIS — G9340 Encephalopathy, unspecified: Secondary | ICD-10-CM | POA: Diagnosis not present

## 2024-05-29 DIAGNOSIS — Z66 Do not resuscitate: Secondary | ICD-10-CM

## 2024-05-29 DIAGNOSIS — Z7189 Other specified counseling: Secondary | ICD-10-CM | POA: Diagnosis not present

## 2024-05-29 DIAGNOSIS — K56609 Unspecified intestinal obstruction, unspecified as to partial versus complete obstruction: Secondary | ICD-10-CM | POA: Diagnosis not present

## 2024-05-29 DIAGNOSIS — J9601 Acute respiratory failure with hypoxia: Secondary | ICD-10-CM | POA: Diagnosis not present

## 2024-05-29 LAB — CBC
HCT: 24.1 % — ABNORMAL LOW (ref 39.0–52.0)
Hemoglobin: 8.1 g/dL — ABNORMAL LOW (ref 13.0–17.0)
MCH: 26 pg (ref 26.0–34.0)
MCHC: 33.6 g/dL (ref 30.0–36.0)
MCV: 77.5 fL — ABNORMAL LOW (ref 80.0–100.0)
Platelets: 242 K/uL (ref 150–400)
RBC: 3.11 MIL/uL — ABNORMAL LOW (ref 4.22–5.81)
RDW: 15.8 % — ABNORMAL HIGH (ref 11.5–15.5)
WBC: 12.4 K/uL — ABNORMAL HIGH (ref 4.0–10.5)
nRBC: 0.2 % (ref 0.0–0.2)

## 2024-05-29 LAB — GLUCOSE, CAPILLARY
Glucose-Capillary: 108 mg/dL — ABNORMAL HIGH (ref 70–99)
Glucose-Capillary: 147 mg/dL — ABNORMAL HIGH (ref 70–99)
Glucose-Capillary: 152 mg/dL — ABNORMAL HIGH (ref 70–99)
Glucose-Capillary: 163 mg/dL — ABNORMAL HIGH (ref 70–99)
Glucose-Capillary: 180 mg/dL — ABNORMAL HIGH (ref 70–99)
Glucose-Capillary: 239 mg/dL — ABNORMAL HIGH (ref 70–99)

## 2024-05-29 LAB — BASIC METABOLIC PANEL WITH GFR
Anion gap: 10 (ref 5–15)
BUN: 82 mg/dL — ABNORMAL HIGH (ref 8–23)
CO2: 24 mmol/L (ref 22–32)
Calcium: 8.8 mg/dL — ABNORMAL LOW (ref 8.9–10.3)
Chloride: 112 mmol/L — ABNORMAL HIGH (ref 98–111)
Creatinine, Ser: 2.64 mg/dL — ABNORMAL HIGH (ref 0.61–1.24)
GFR, Estimated: 23 mL/min — ABNORMAL LOW (ref >60)
Glucose, Bld: 132 mg/dL — ABNORMAL HIGH (ref 70–99)
Potassium: 4.2 mmol/L (ref 3.5–5.1)
Sodium: 146 mmol/L — ABNORMAL HIGH (ref 135–145)

## 2024-05-29 MED ORDER — ASPIRIN 81 MG PO CHEW
81.0000 mg | CHEWABLE_TABLET | Freq: Every day | ORAL | Status: DC
  Administered 2024-05-30 – 2024-06-01 (×3): 81 mg
  Filled 2024-05-29 (×3): qty 1

## 2024-05-29 MED ORDER — ORAL CARE MOUTH RINSE
15.0000 mL | OROMUCOSAL | Status: DC
  Administered 2024-05-29 – 2024-06-04 (×24): 15 mL via OROMUCOSAL

## 2024-05-29 MED ORDER — ORAL CARE MOUTH RINSE: 15.0000 mL | OROMUCOSAL | Status: DC | PRN

## 2024-05-29 NOTE — Procedures (Signed)
 Cortrak  Person Inserting Tube:  Jonathan Baird, [name], RD Tube Type:  Cortrak - 43 inches Tube Size:  10 Tube Location:  Left nare Secured by: Bridle Technique Used to Measure Tube Placement:  Marking at nare/corner of mouth Cortrak Secured At:  65 cm   Cortrak Tube Team Note:  Consult received to place a Cortrak feeding tube.   X-ray is required. RN may begin using tube once placement is confirmed by x-ray.   If the tube becomes dislodged please keep the tube and contact the Cortrak team at [email or web address] for replacement.  If after hours and replacement cannot be delayed, place a NG tube and confirm placement with an abdominal x-ray.    [name] Jonathan Baird RD, LDN Clinical Dietitian

## 2024-05-29 NOTE — Progress Notes (Signed)
 PROGRESS NOTE    Jonathan Baird  FMW:[id number] DOB: 02/22/1933 DOA: 05/20/2024 PCP: Verdia Lombard, MD    Brief Narrative:  89 year old gentleman with history of hypertension, hyperlipidemia, type 2 diabetes, CKD stage IV, peripheral vascular disease, right RCA stenosis, colon cancer status post hemicolectomy and dementia admitted with respiratory distress, small bowel obstruction, aspiration pneumonia and septic shock.  While waiting in the emergency room, he had a witnessed seizure lasting about 2 minutes with left eye deviation.  Neuro was consulted.  Patient was intubated on admitted to the ICU. Significant events: 7/9 - Presented to ED with AMS, SOB. 7/10 - PCCM consulted for airway protection, intubated in ED. 7/11-sedation stopped for WUA 7/13-remains off sedation 7/15-palliative care met with patient.  One-way extubation on 7/16.  Antibiotics: Patient received 7 days of antibiotics and completed therapy with Zosyn  and doxycycline  7/15.   Subjective: Patient seen and examined in the morning rounds.  He was in the process of being moved to 6 N.  He responds to strong voice.  Confused.  No meaningful conversation and does not follow commands.  Remains on tube feeding.  Speech therapy planning for modified barium swallow.  There was no family at the bedside when I rounded in the morning. Multiple discussions with palliative provider and other care team.  Assessment & Plan:   Acute metabolic encephalopathy in the context of underlying dementia and concern for seizures: Seen by neurology.  Loaded with Keppra  and given Ativan . Patient had continuous EEG that did not reveal any seizures. Currently not on any antiepileptics.  Seen by neurology. Patient also found to have acute left frontal lobe infarct.  Seen by stroke team.  Remains on aspirin .   Statins not needed, LDL low.  Acute hypoxemic respiratory failure and aspiration pneumonia: Extubated to room air.  Remains NPO.   Completed 5 days course of Zosyn  and doxycycline . Continue bronchodilator therapy and chest physiotherapy.  Speech therapy following, not ready to swallow yet.  AKI with history of CKD stage IV: Improving.  Monitor.  Small bowel obstruction: Resolved without surgical intervention: Now remains on tube feeding.  Goal of care DNR/DNI Allow time for outcomes Family inclined to serve him with comfort care and hospice approach if his condition to worsen.    DVT prophylaxis: heparin  injection 5,000 Units Start: 05/21/24 1400 SCDs Start: 05/21/24 0233   Code Status: DNR/DNI Family Communication: None on my exam.  Palliative care communicating with wife. Disposition Plan: Status is: Inpatient Remains inpatient appropriate because: N.p.o.,     Consultants:  General surgery Neurology Critical care Palliative care  Procedures:  Intubation  Antimicrobials:  Completed     Objective: Vitals:   05/29/24 0710 05/29/24 0800 05/29/24 1015 05/29/24 1253  BP: (!) 160/56 (!) 155/53 (!) 160/60 (!) 150/67  Pulse: 71 65 62 66  Resp: 17 (!) 24 18 17   Temp: 97.9 F (36.6 C)  98 F (36.7 C) 98.4 F (36.9 C)  TempSrc: Oral  Oral Oral  SpO2: 100% 100% 100% 98%  Weight:      Height:        Intake/Output Summary (Last 24 hours) at 05/29/2024 1359 Last data filed at 05/29/2024 1218 Gross per 24 hour  Intake 1965 ml  Output 1750 ml  Net 215 ml   Filed Weights   05/27/24 0416 05/28/24 0422 05/29/24 0500  Weight: 63.9 kg 61.2 kg 64.4 kg    Examination:  General: Sick looking very frail older gentleman without any distress. Cardiovascular: S1-S2 normal.  Regular rate rhythm.  On room air. Respiratory: Bilateral clear.  On room air. Gastrointestinal: Soft and nontender.  Tolerating tube feeding. Ext: No edema.  No deformities. Neuro: Mostly sleepy.  Wakes up on his strong stimulation.  Moves all extremities.  Does not follow command and does not interact.    Data Reviewed: I  have personally reviewed following labs and imaging studies  CBC: Recent Labs  Lab 05/24/24 0457 05/25/24 0331 05/26/24 0551 05/27/24 0554 05/28/24 0524 05/29/24 0452  WBC 16.2* 15.6* 12.1* 13.6* 12.8* 12.4*  NEUTROABS 13.8* 12.9* 9.5* 10.7* 10.5*  --   HGB 8.3* 8.1* 8.2* 8.0* 8.1* 8.1*  HCT 25.1* 24.8* 26.2* 24.6* 24.5* 24.1*  MCV 79.4* 80.0 83.4 79.4* 78.3* 77.5*  PLT 221 227 175 214 228 242   Basic Metabolic Panel: Recent Labs  Lab 05/25/24 0331 05/26/24 0551 05/27/24 0554 05/28/24 0524 05/29/24 0452  NA 157* 151* 147* 144 146*  K 3.7 4.3 3.9 4.1 4.2  CL 124* 117* 110 111 112*  CO2 24 23 23 23 24   GLUCOSE 65* 181* 168* 211* 132*  BUN 74* 70* 70* 70* 82*  CREATININE 3.48* 3.18* 2.82* 2.59* 2.64*  CALCIUM  9.4 9.1 8.8* 8.8* 8.8*   GFR: Estimated Creatinine Clearance: 17.6 mL/min (A) (by C-G formula based on SCr of 2.64 mg/dL (H)). Liver Function Tests: No results for input(s): AST, ALT, ALKPHOS, BILITOT, PROT, ALBUMIN in the last 168 hours. No results for input(s): LIPASE, AMYLASE in the last 168 hours. No results for input(s): AMMONIA in the last 168 hours. Coagulation Profile: No results for input(s): INR, PROTIME in the last 168 hours. Cardiac Enzymes: No results for input(s): CKTOTAL, CKMB, CKMBINDEX, TROPONINI in the last 168 hours. BNP (last 3 results) No results for input(s): PROBNP in the last 8760 hours. HbA1C: No results for input(s): HGBA1C in the last 72 hours. CBG: Recent Labs  Lab 05/28/24 1931 05/28/24 2337 05/29/24 0335 05/29/24 0659 05/29/24 1251  GLUCAP 126* 122* 108* 152* 180*   Lipid Profile: No results for input(s): CHOL, HDL, LDLCALC, TRIG, CHOLHDL, LDLDIRECT in the last 72 hours. Thyroid  Function Tests: No results for input(s): TSH, T4TOTAL, FREET4, T3FREE, THYROIDAB in the last 72 hours. Anemia Panel: No results for input(s): VITAMINB12, FOLATE, FERRITIN, TIBC,  IRON, RETICCTPCT in the last 72 hours. Sepsis Labs: No results for input(s): PROCALCITON, LATICACIDVEN in the last 168 hours.   Recent Results (from the past 240 hours)  Resp panel by RT-PCR (RSV, Flu A&B, Covid) Anterior Nasal Swab     Status: None   Collection Time: 05/20/24  5:37 PM   Specimen: Anterior Nasal Swab  Result Value Ref Range Status   SARS Coronavirus 2 by RT PCR NEGATIVE NEGATIVE Final   Influenza A by PCR NEGATIVE NEGATIVE Final   Influenza B by PCR NEGATIVE NEGATIVE Final    Comment: (NOTE) The Xpert Xpress SARS-CoV-2/FLU/RSV plus assay is intended as an aid in the diagnosis of influenza from Nasopharyngeal swab specimens and should not be used as a sole basis for treatment. Nasal washings and aspirates are unacceptable for Xpert Xpress SARS-CoV-2/FLU/RSV testing.  Fact Sheet for Patients: BloggerCourse.com  Fact Sheet for Healthcare Providers: SeriousBroker.it  This test is not yet approved or cleared by the [place]  FDA and has been authorized for detection and/or diagnosis of SARS-CoV-2 by FDA under an Emergency Use Authorization (EUA). This EUA will remain in effect (meaning this test can be used) for the duration of the COVID-19 declaration under Section 564(b)(1) of  the Act, 21 U.S.C. section 360bbb-3(b)(1), unless the authorization is terminated or revoked.     Resp Syncytial Virus by PCR NEGATIVE NEGATIVE Final    Comment: (NOTE) Fact Sheet for Patients: BloggerCourse.com  Fact Sheet for Healthcare Providers: SeriousBroker.it  This test is not yet approved or cleared by the [place]  FDA and has been authorized for detection and/or diagnosis of SARS-CoV-2 by FDA under an Emergency Use Authorization (EUA). This EUA will remain in effect (meaning this test can be used) for the duration of the COVID-19 declaration under Section  564(b)(1) of the Act, 21 U.S.C. section 360bbb-3(b)(1), unless the authorization is terminated or revoked.  Performed at [facility], [address] [place], [place] [address]   Blood Culture (routine x 2)     Status: None   Collection Time: 05/20/24  5:37 PM   Specimen: BLOOD  Result Value Ref Range Status   Specimen Description BLOOD SITE NOT SPECIFIED  Final   Special Requests   Final    BOTTLES DRAWN AEROBIC AND ANAEROBIC Blood Culture adequate volume   Culture   Final    NO GROWTH 5 DAYS Performed at [facility], [address] [place], [place] [address]    Report Status 05/25/2024 FINAL  Final  Blood Culture (routine x 2)     Status: None   Collection Time: 05/20/24  6:04 PM   Specimen: BLOOD  Result Value Ref Range Status   Specimen Description BLOOD SITE NOT SPECIFIED  Final   Special Requests   Final    BOTTLES DRAWN AEROBIC AND ANAEROBIC Blood Culture results may not be optimal due to an inadequate volume of blood received in culture bottles   Culture   Final    NO GROWTH 5 DAYS Performed at [facility], [address] [place], [place] [address]    Report Status 05/25/2024 FINAL  Final  MRSA Next Gen by PCR, Nasal     Status: None   Collection Time: 05/21/24  1:18 AM   Specimen: Nasal Mucosa; Nasal Swab  Result Value Ref Range Status   MRSA by PCR Next Gen NOT DETECTED NOT DETECTED Final    Comment: (NOTE) The GeneXpert MRSA Assay (FDA approved for NASAL specimens only), is one component of a comprehensive MRSA colonization surveillance program. It is not intended to diagnose MRSA infection nor to guide or monitor treatment for MRSA infections. Test performance is not FDA approved in patients less than 67 years old. Performed at [facility], [address], [place], [place] [address]   Culture, Respiratory w Gram Stain     Status: None   Collection Time: 05/21/24 11:27 AM   Specimen: Tracheal Aspirate; Respiratory  Result Value  Ref Range Status   Specimen Description TRACHEAL ASPIRATE  Final   Special Requests NONE  Final   Gram Stain   Final    MODERATE WBC PRESENT, PREDOMINANTLY PMN FEW GRAM POSITIVE COCCI IN CLUSTERS FEW GRAM NEGATIVE RODS    Culture   Final    FEW Normal respiratory flora-no Staph aureus or Pseudomonas seen Performed at [facility], [address], [place], [place] [address]    Report Status 05/23/2024 FINAL  Final         Radiology Studies: DG Swallowing Func-Speech Pathology Result Date: 05/29/2024 Table formatting from the original result was not included. Modified Barium Swallow Study Patient Details Name: LAMARKUS NEBEL MRN: [id number] Date of Birth: 18-Jul-1933 Today's Date:  05/29/2024 HPI/PMH: HPI: 89 year old man who presented to [facility] ED 7/10 for respiratory distress. While awaiting bed placement in ED, patient had a witnessed seizure lasting about 2 minutes with L eye deviation.  Patient remained particularly somnolent and was poorly protecting his airway with gurgling respirations and decision was made to intubate 7/9-7/16 (one way extubation). Pt additionally diagnosed with acute left frontal lobe CVA, SBO that resolved , and aspiration pna. CT on 7/9 (day of admission) showed Scattered airspace opacities are noted in the right lung with consolidation in the right upper and lower lobes. There is consolidation in the left lower lobe with mild airspace disease in  the left upper lobe.  And dilated, fluid filled esophagus. PMHx significant for HTN, HLD, T2DM, stage IV CKD, PVD, R ICA stenosis, colon CA s/p hemicolectomy, dementia. Clinical Impression: Clinical Impression: Very limited study due to pts poor tolerance of MBS chair. Pt could not hold head up and leaned forward despite assist and cues which contributed to aspiration. Severity of dysaphgia rated as a 3 on DIGEST scale due to gross aspiration of thin liquids, but no pharyngeal residue. Swallowing mechanism impaired by delayed  initiation, poor positioning and weak cough response, but otherwise movement of oropharyngeal musculature was very good.  Esophageal sweep after two sips of liquid and two bites of puree relatively unremarkable.  If pts generalized strength improved and pt were more interactive, suspect he may tolerate a wide rage of PO with adequate safety. Will need a repeat study to assess function after further recovery. Pt to continue NPO with ice chips for now. SLP will f/u for therapy. Factors that may increase risk of adverse event in presence of aspiration Noe & Lianne 2021): Factors that may increase risk of adverse event in presence of aspiration Noe & Lianne 2021): Aspiration of thick, dense, and/or acidic materials; Frequent aspiration of large volumes; Weak cough; Dependence for feeding and/or oral hygiene; Limited mobility; Frail or deconditioned; Reduced cognitive function; Poor general health and/or compromised immunity Recommendations/Plan: Swallowing Evaluation Recommendations Swallowing Evaluation Recommendations Recommendations: NPO; Alternative means of nutrition - NG Tube Medication Administration: Via alternative means Postural changes: Stay upright 30-60 min after meals Caregiver Recommendations: Have oral suction available Treatment Plan Treatment Plan Treatment recommendations: Therapy as outlined in treatment plan below Follow-up recommendations: Skilled nursing-short term rehab (<3 hours/day) Treatment frequency: Min 2x/week Treatment duration: 2 weeks Recommendations Recommendations for follow up therapy are one component of a multi-disciplinary discharge planning process, led by the attending physician.  Recommendations may be updated based on patient status, additional functional criteria and insurance authorization. Assessment: Orofacial Exam: Orofacial Exam Oral Cavity - Dentition: Missing dentition Anatomy: Anatomy: WFL Boluses Administered: Boluses Administered Boluses Administered: Thin  liquids (Level 0); Puree  Oral Impairment Domain: Oral Impairment Domain Lip Closure: No labial escape Tongue control during bolus hold: Not tested Bolus transport/lingual motion: Slow tongue motion Oral residue: Complete oral clearance Initiation of pharyngeal swallow : Pyriform sinuses  Pharyngeal Impairment Domain: Pharyngeal Impairment Domain Soft palate elevation: No bolus between soft palate (SP)/pharyngeal wall (PW) Laryngeal elevation: Complete superior movement of thyroid  cartilage with complete approximation of arytenoids to epiglottic petiole Anterior hyoid excursion: Complete anterior movement Epiglottic movement: Complete inversion Laryngeal vestibule closure: Complete, no air/contrast in laryngeal vestibule Pharyngeal stripping wave : Present - complete Pharyngoesophageal segment opening: Complete distension and complete duration, no obstruction of flow Tongue base retraction: No contrast between tongue base and posterior pharyngeal wall (PPW) Pharyngeal residue: Complete pharyngeal clearance  Esophageal Impairment Domain:  No data recorded Pill: No data recorded Penetration/Aspiration Scale Score: Penetration/Aspiration Scale Score 1.  Material does not enter airway: Puree 8.  Material enters airway, passes BELOW cords without attempt by patient to eject out (silent aspiration) : Thin liquids (Level 0) Compensatory Strategies: No data recorded  General Information: Caregiver present: No  Diet Prior to this Study: NPO; Large bore NG tube   No data recorded  Respiratory Status: WFL   Supplemental O2: None (Room air)   History of Recent Intubation: Yes  Behavior/Cognition: Requires cueing Self-Feeding Abilities: Dependent for feeding Baseline vocal quality/speech: Hypophonia/low volume Volitional Cough: Able to elicit Volitional Swallow: Able to elicit Exam Limitations: Poor positioning; Fatigue Goal Planning: Prognosis for improved oropharyngeal function: Fair No data recorded No data recorded No data  recorded No data recorded Pain: Pain Assessment Pain Assessment: Faces Faces Pain Scale: 0 End of Session: Start Time:SLP Start Time (ACUTE ONLY): 1100 Stop Time: SLP Stop Time (ACUTE ONLY): 1118 Time Calculation:SLP Time Calculation (min) (ACUTE ONLY): 18 min Charges: SLP Evaluations $ SLP Speech Visit: 1 Visit SLP Evaluations $BSS Swallow: 1 Procedure $MBS Swallow: 1 Procedure $Swallowing Treatment: 1 Procedure SLP visit diagnosis: SLP Visit Diagnosis: Dysphagia, oropharyngeal phase (R13.12) Past Medical History: Past Medical History: Diagnosis Date  Carotid artery stenosis   Right  CKD (chronic kidney disease), stage IV (HCC)   Colon cancer (HCC) 10/12/2004  stage III, s/p hemicolectomy  Dementia (HCC)   Diabetes mellitus   HLD (hyperlipidemia)   HTN (hypertension)   Peripheral vascular disease [facility])  Past Surgical History: Past Surgical History: Procedure Laterality Date  hemocolectomy  11/13/2003 DeBlois, Consuelo Fitch 05/29/2024, 1:41 PM  DG Abd Portable 1V Result Date: 05/28/2024 EXAM: 1 VIEW XRAY OF THE ABDOMEN 05/28/2024 08:57:00 AM COMPARISON: 05/22/2024 CLINICAL HISTORY: Nasogastric tube present. FINDINGS: BOWEL: Gas-filled colon. There may be mild left abdominal small bowel dilatation at up to 3.0 cm. SOFT TISSUES: No free intraperitoneal air. BONES: No acute osseous abnormality. LINES AND TUBES: Nasogastric tube has been replaced or advanced. The tip is at the distal gastric antrum or the proximal duodenum. IMPRESSION: 1. Nasogastric tube terminates at the distal gastric antrum or the proximal duodenum. 2. Mild left abdominal small bowel dilatation up to 3.0 cm. Cannot exclude mild adynamic ileus or low-grade partial small bowel obstruction. Electronically signed by: Rockey Kilts MD 05/28/2024 12:45 PM EDT RP Workstation: [id number]        Scheduled Meds:  amLODipine   10 mg Per Tube Daily   aspirin   81 mg Per Tube Daily   Chlorhexidine  Gluconate Cloth  6 each Topical Daily   docusate  100  mg Per Tube BID   free water   200 mL Per Tube Q4H   heparin   5,000 Units Subcutaneous Q8H   insulin  aspart  0-15 Units Subcutaneous Q4H   insulin  glargine-yfgn  10 Units Subcutaneous Daily   labetalol   100 mg Per Tube BID   nutrition supplement (JUVEN)  1 packet Per Tube BID BM   mouth rinse  15 mL Mouth Rinse 4 times per day   pantoprazole  (PROTONIX ) IV  40 mg Intravenous Daily   polyethylene glycol  17 g Per Tube Daily   sodium chloride  flush  3 mL Intravenous Q12H   Continuous Infusions:  feeding supplement (VITAL 1.5 CAL) 45 mL/hr at 05/29/24 0600     LOS: 8 days    Time spent: 55 minutes    Renato Applebaum, MD [facility]

## 2024-05-29 NOTE — Progress Notes (Signed)
 Modified Barium Swallow Study  Patient Details  Name: Jonathan Baird MRN: [id number] Date of Birth: [birth year]  Today's Date: 05/29/2024  Modified Barium Swallow completed.  Full report located under Chart Review in the Imaging Section.  History of Present Illness 89 year old man who presented to [facility] ED 7/10 for respiratory distress. While awaiting bed placement in ED, patient had a witnessed seizure lasting about 2 minutes with L eye deviation.  Patient remained particularly somnolent and was poorly protecting his airway with gurgling respirations and decision was made to intubate 7/9-7/16 (one way extubation). Pt additionally diagnosed with acute left frontal lobe CVA, SBO that resolved , and aspiration pna. CT on 7/9 (day of admission) showed Scattered airspace opacities are noted in the right lung with consolidation in the right upper and lower lobes. There is consolidation in the left lower lobe with mild airspace disease in  the left upper lobe.  And dilated, fluid filled esophagus. PMHx significant for HTN, HLD, T2DM, stage IV CKD, PVD, R ICA stenosis, colon CA s/p hemicolectomy, dementia.   Clinical Impression Very limited study due to pts poor tolerance of MBS chair. Pt could not hold head up and leaned forward despite assist and cues which exacerbated pt dysphagia. Severity of dysaphgia rated as a 3 on DIGEST scale due to gross aspiration of thin liquids, but no pharyngeal residue. Swallowing mechanism impaired by delayed initiation, poor positioning and weak cough response, but otherwise movement of oropharyngeal musculature was very good.  Esophageal sweep after two sips of liquid and two bites of puree relatively unremarkable.  If pts generalized strength improved and pt were more interactive, suspect he may tolerate a wide rage of PO with adequate safety. Will need a repeat study to assess function after further recovery. Pt to continue NPO with ice chips for now. SLP will f/u for  therapy. Factors that may increase risk of adverse event in presence of aspiration Noe & Lianne 2021): Aspiration of thick, dense, and/or acidic materials;Frequent aspiration of large volumes;Weak cough;Dependence for feeding and/or oral hygiene;Limited mobility;Frail or deconditioned;Reduced cognitive function;Poor general health and/or compromised immunity  Swallow Evaluation Recommendations Recommendations: NPO;Alternative means of nutrition - NG Tube Medication Administration: Via alternative means Postural changes: Stay upright 30-60 min after meals Caregiver Recommendations: Have oral suction available      Michaelyn Wall, Consuelo Fitch 05/29/2024,1:37 PM

## 2024-05-29 NOTE — Progress Notes (Signed)
 RN attempted to call report to 6N, unable to give report at this time d/t receiving being busy with patient care.

## 2024-05-29 NOTE — Progress Notes (Signed)
 Speech Language Pathology Treatment: Dysphagia  Patient Details Name: Jonathan Baird MRN: [id number] DOB: [birth year] Today's Date: 05/29/2024 Time: 9099-9084 SLP Time Calculation (min) (ACUTE ONLY): 15 min  Assessment / Plan / Recommendation Clinical Impression  Pt appears similar in function to yesterday. He follows one step commands, is attentive to water  and ice, swallows without cueing or extra assist. However, he continues to have very gurgly vocal quality that does not clear when cued to cough or clear his throat. When sipping water  pt does not cough. Continue to suspect impaired sensation to aspiration and also insufficient cough strength. Since pt appears eager to eat and drink and relatively stable, though weak, recommend proceeding with MBS today to aid in further decisions regarding nutrition and plan of care. Hope to pull NG tube and have Cortrak placed after study if needed and if part of the family's plan for the pts care.    HPI HPI: 89 year old man who presented to [facility] ED 7/10 for respiratory distress. While awaiting bed placement in ED, patient had a witnessed seizure lasting about 2 minutes with L eye deviation.  Patient remained particularly somnolent and was poorly protecting his airway with gurgling respirations and decision was made to intubate 7/9-7/16 (one way extubation). Pt additionally diagnosed with acute left frontal lobe CVA, SBO that resolved , and aspiration pna. CT on 7/9 (day of admission) showed Scattered airspace opacities are noted in the right lung with consolidation in the right upper and lower lobes. There is consolidation in the left lower lobe with mild airspace disease in  the left upper lobe.  And dilated, fluid filled esophagus. PMHx significant for HTN, HLD, T2DM, stage IV CKD, PVD, R ICA stenosis, colon CA s/p hemicolectomy, dementia.      SLP Plan  MBS          Recommendations  Diet recommendations: NPO                  Oral care  QID;Oral care prior to ice chip/H20   Frequent or constant Supervision/Assistance Dysphagia, unspecified (R13.10)     MBS     Epiphany Seltzer, Consuelo Fitch  05/29/2024, 9:18 AM

## 2024-05-29 NOTE — Progress Notes (Signed)
 Daily Progress Note   Patient Name: Jonathan Baird       Date: 05/29/2024 DOB: 10/15/1933  Age: 89 y.o. MRN#: [id number] Attending Physician: Raenelle Coria, MD Primary Care Physician: Verdia Lombard, MD Admit Date: 05/20/2024  Reason for Consultation/Follow-up: Establishing goals of care  Subjective: Minimally interactive  Length of Stay: 8  Current Medications: Scheduled Meds:   amLODipine   10 mg Per Tube Daily   aspirin   81 mg Per Tube Daily   Chlorhexidine  Gluconate Cloth  6 each Topical Daily   docusate  100 mg Per Tube BID   free water   200 mL Per Tube Q4H   heparin   5,000 Units Subcutaneous Q8H   insulin  aspart  0-15 Units Subcutaneous Q4H   insulin  glargine-yfgn  10 Units Subcutaneous Daily   labetalol   100 mg Per Tube BID   nutrition supplement (JUVEN)  1 packet Per Tube BID BM   mouth rinse  15 mL Mouth Rinse 4 times per day   pantoprazole  (PROTONIX ) IV  40 mg Intravenous Daily   polyethylene glycol  17 g Per Tube Daily   sodium chloride  flush  3 mL Intravenous Q12H    Continuous Infusions:  feeding supplement (VITAL 1.5 CAL) 45 mL/hr at 05/29/24 0600    PRN Meds: albuterol , dextrose , fentaNYL  (SUBLIMAZE ) injection, fentaNYL  (SUBLIMAZE ) injection, hydrALAZINE , labetalol , mouth rinse, mouth rinse  Physical Exam Constitutional:      General: He is not in acute distress.    Appearance: He is ill-appearing.     Comments: Follows commands, does not open eyes or turn towards voice, occasional grunt to voice  Pulmonary:     Effort: Pulmonary effort is normal.  Skin:    General: Skin is warm and dry.             Vital Signs: BP (!) 150/67 (BP Location: Left Arm)   Pulse 66   Temp 98.4 F (36.9 C) (Oral)   Resp 17   Ht 5' 10 (1.778 m)   Wt 64.4 kg   SpO2 98%   BMI  20.37 kg/m  SpO2: SpO2: 98 % O2 Device: O2 Device: Room Air O2 Flow Rate:    Intake/output summary:  Intake/Output Summary (Last 24 hours) at 05/29/2024 1619 Last data filed at 05/29/2024 1218 Gross per 24 hour  Intake 1630 ml  Output 1275 ml  Net 355 ml   LBM: Last BM Date : 05/29/24 Baseline Weight: Weight: 65 kg Most recent weight: Weight: 64.4 kg        Patient Active Problem List   Diagnosis Date Noted   Small bowel obstruction (HCC) 05/21/2024   Acute hypoxemic respiratory failure (HCC) 05/21/2024   Seizure-like activity (HCC) 05/21/2024   Multifocal pneumonia 05/21/2024   Acute respiratory failure with hypoxia (HCC) 05/21/2024   Protein-calorie malnutrition, severe 05/21/2024   Generalized weakness 12/31/2023   History of dementia 12/31/2023   CKD (chronic kidney disease), stage IV (HCC) 12/31/2023   History of carotid artery stenosis 12/31/2023   History of colon cancer status post colectomy 12/31/2023   Hyperkalemia 12/31/2023   Urinary hesitancy 12/31/2023   Syncope 10/19/2023   CAP (community acquired pneumonia) 10/19/2023   Acute renal failure superimposed on  stage 4 chronic kidney disease (HCC) 10/19/2023   Acute encephalopathy 10/19/2023   Elevated troponin 10/19/2023   Insulin  dependent type 2 diabetes mellitus (HCC) 10/19/2023   History of vitrectomy 04/30/2022   Abnormal gait 02/13/2022   Altered mental status 02/13/2022   Unspecified visual loss 02/13/2022   Stage 3b chronic kidney disease (HCC) 09/25/2021   Stenosis of carotid artery 09/25/2021   Essential hypertension 01/17/2021   Chronic kidney disease due to hypertension 01/17/2021   Diabetic renal disease (HCC) 01/17/2021   Diabetic retinopathy associated with type 2 diabetes mellitus (HCC) 01/17/2021   Disorder of arteries and arterioles, unspecified (HCC) 01/17/2021   Drug-induced hypoglycemia without coma 01/17/2021   Adult general medical exam 01/17/2021   Hyperglycemia due to type 2  diabetes mellitus (HCC) 01/17/2021   Long term (current) use of insulin  (HCC) 01/17/2021   Hyperlipidemia 01/17/2021   PVD (peripheral vascular disease) (HCC) 01/17/2021   Controlled diabetes mellitus with stable proliferative retinopathy of both eyes (HCC) 08/10/2020   Right epiretinal membrane 08/10/2020   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 degeneration of both eyes with subfoveal involvement 08/10/2020   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 PMHx significant for HTN, HLD, T2DM, stage IV CKD, PVD, R ICA stenosis, colon CA s/p hemicolectomy, dementia. The Palliative care team as been asked to support additional goals of care conversations.    While in ER patient had witnessed seizure. Patient also found to have acute left frontal lobe infarct. Patient was intubated 7/10 for airway protection. Proceeded with one way extubation 7/16.  SLP eval 7/19 - remains at risk for aspiration and alternative means of nutrition recommended. Cortrak placed.   Assessment: Received update from SLP and RD.  Follow up today with family.  Had some difficulty getting in touch with them. Multiple calls to phone with busy signal. Left voicemail on cell phone number listed. Eventually able to speak to family - initially spoke to wife who deferred conversation to her daughter [name]. [name] shares they have been exhausted and so took today to rest. Plan to visit over weekend.  While speaking with [name] we discussed his ongoing altered mental status - very lethargic and minimal verbalizations but does follow commands. She shares she has seen this as well.  We reviewed SLP eval. Reviewed plan to place cortrak to give him time for improvement in mental status. They understand.  We discussed potential next options. Asked them to consider how patient would  want to move forward if swallow/mental status does not improve. She understands and family will discuss.  We discussed plan to follow up on Monday after allowing time over the weekend for improvement.   Recommendations/Plan: Ongoing GOC discussions - cortrak placed, continue over weekend with plan for PMT to follow up Monday for ongoing GOC discussions depending on how patient does over weekend DNR/DNI  Care plan was discussed with patient's daughter [name]  Thank you for allowing the Palliative Medicine Team to assist in the care of this patient.   Total Time 45 minutes Prolonged Time Billed  no   Time spent includes: Detailed review of medical records (labs, imaging, vital signs), medically appropriate exam, discussion with treatment team, counseling and educating patient, family and/or staff, documenting clinical information, medication management and coordination of care.     *Please note that this is a verbal dictation therefore  any spelling or grammatical errors are due to the [employer] system interpretation.  Tobey Jama Barnacle, DNP, [facility] Palliative Medicine Team Team Phone # [phone]  Pager [phone]

## 2024-05-29 NOTE — Plan of Care (Signed)
   Problem: Activity: Goal: Ability to tolerate increased activity will improve Outcome: Progressing

## 2024-05-29 NOTE — Plan of Care (Signed)
  Problem: Respiratory: Goal: Ability to maintain a clear airway and adequate ventilation will improve Outcome: Progressing   

## 2024-05-30 DIAGNOSIS — K56609 Unspecified intestinal obstruction, unspecified as to partial versus complete obstruction: Secondary | ICD-10-CM | POA: Diagnosis not present

## 2024-05-30 LAB — BASIC METABOLIC PANEL WITH GFR
Anion gap: 6 (ref 5–15)
BUN: 89 mg/dL — ABNORMAL HIGH (ref 8–23)
CO2: 25 mmol/L (ref 22–32)
Calcium: 8.9 mg/dL (ref 8.9–10.3)
Chloride: 114 mmol/L — ABNORMAL HIGH (ref 98–111)
Creatinine, Ser: 2.66 mg/dL — ABNORMAL HIGH (ref 0.61–1.24)
GFR, Estimated: 22 mL/min — ABNORMAL LOW (ref >60)
Glucose, Bld: 251 mg/dL — ABNORMAL HIGH (ref 70–99)
Potassium: 4.3 mmol/L (ref 3.5–5.1)
Sodium: 145 mmol/L (ref 135–145)

## 2024-05-30 LAB — GLUCOSE, CAPILLARY
Glucose-Capillary: 147 mg/dL — ABNORMAL HIGH (ref 70–99)
Glucose-Capillary: 151 mg/dL — ABNORMAL HIGH (ref 70–99)
Glucose-Capillary: 203 mg/dL — ABNORMAL HIGH (ref 70–99)
Glucose-Capillary: 223 mg/dL — ABNORMAL HIGH (ref 70–99)
Glucose-Capillary: 223 mg/dL — ABNORMAL HIGH (ref 70–99)
Glucose-Capillary: 242 mg/dL — ABNORMAL HIGH (ref 70–99)

## 2024-05-30 NOTE — Plan of Care (Signed)
  Problem: Respiratory: Goal: Ability to maintain a clear airway and adequate ventilation will improve Outcome: Not Progressing   Problem: Role Relationship: Goal: Method of communication will improve Outcome: Not Progressing   Problem: Education: Goal: Knowledge of General Education information will improve Description: Including pain rating scale, medication(s)/side effects and non-pharmacologic comfort measures Outcome: Not Progressing   Problem: Health Behavior/Discharge Planning: Goal: Ability to manage health-related needs will improve Outcome: Not Progressing   Problem: Clinical Measurements: Goal: Ability to maintain clinical measurements within normal limits will improve Outcome: Not Progressing Goal: Diagnostic test results will improve Outcome: Not Progressing Goal: Respiratory complications will improve Outcome: Not Progressing   Problem: Activity: Goal: Risk for activity intolerance will decrease Outcome: Not Progressing   Problem: Nutrition: Goal: Adequate nutrition will be maintained Outcome: Not Progressing   Problem: Elimination: Goal: Will not experience complications related to bowel motility Outcome: Not Progressing   Problem: Skin Integrity: Goal: Risk for impaired skin integrity will decrease Outcome: Not Progressing   Problem: Education: Goal: Knowledge of disease or condition will improve Outcome: Not Progressing Goal: Knowledge of secondary prevention will improve (MUST DOCUMENT ALL) Outcome: Not Progressing Goal: Knowledge of patient specific risk factors will improve (DELETE if not current risk factor) Outcome: Not Progressing   Problem: Ischemic Stroke/TIA Tissue Perfusion: Goal: Complications of ischemic stroke/TIA will be minimized Outcome: Not Progressing   Problem: Coping: Goal: Will verbalize positive feelings about self Outcome: Not Progressing Goal: Will identify appropriate support needs Outcome: Not Progressing   Problem:  Self-Care: Goal: Ability to participate in self-care as condition permits will improve Outcome: Not Progressing Goal: Verbalization of feelings and concerns over difficulty with self-care will improve Outcome: Not Progressing Goal: Ability to communicate needs accurately will improve Outcome: Not Progressing   Problem: Nutrition: Goal: Risk of aspiration will decrease Outcome: Not Progressing Goal: Dietary intake will improve Outcome: Not Progressing   Problem: Safety: Goal: Non-violent Restraint(s) Outcome: Not Progressing

## 2024-05-30 NOTE — Progress Notes (Signed)
 Family would like to be present next speech follow up visit, just call them they will come in

## 2024-05-30 NOTE — Progress Notes (Signed)
 PROGRESS NOTE    Jonathan Baird  FMW:[id number] DOB: Jun 11, 1933 DOA: 05/20/2024 PCP: Verdia Lombard, MD    Brief Narrative:  89 year old gentleman with history of hypertension, hyperlipidemia, type 2 diabetes, CKD stage IV, peripheral vascular disease, right RCA stenosis, colon cancer status post hemicolectomy and dementia admitted with respiratory distress, small bowel obstruction, aspiration pneumonia and septic shock.  While waiting in the emergency room, he had a witnessed seizure lasting about 2 minutes with left eye deviation.  Neuro was consulted.  Patient was intubated on admitted to the ICU. Significant events: 7/9 - Presented to ED with AMS, SOB. 7/10 - PCCM consulted for airway protection, intubated in ED. 7/11-sedation stopped for WUA 7/13-remains off sedation 7/15-palliative care met with patient.  One-way extubation on 7/16. Transferred to medical floor.  Remains lethargic on tube feeding.    Subjective:  Patient seen and examined.  No overnight events.  Lethargic and barely responds.  Tolerating tube feeding.  Assessment & Plan:   Acute metabolic encephalopathy in the context of underlying dementia and concern for seizures: Seen by neurology.  Loaded with Keppra  and given Ativan . Patient had continuous EEG that did not reveal any seizures. Currently not on any antiepileptics.  Seen by neurology. Patient also found to have acute left frontal lobe infarct.  Seen by stroke team.  Remains on aspirin .   Statins not needed, LDL low.  Acute hypoxemic respiratory failure and aspiration pneumonia: Extubated to room air.  Remains NPO.  Completed 5 days course of Zosyn  and doxycycline . Continue bronchodilator therapy and chest physiotherapy.  Speech therapy following, not ready to swallow yet.  AKI with history of CKD stage IV: Creatinine remains elevated.  Small bowel obstruction: Resolved without surgical intervention: Now remains on tube feeding.  Goal of  care DNR/DNI Allow time for outcomes 7/19, called and updated patient's daughter on the phone.  They are going to visit him today.  They want to give him some time to recover and see if he can eat.  I recommended that he is appropriate for palliative and hospice care if they desire to.    DVT prophylaxis: heparin  injection 5,000 Units Start: 05/21/24 1400 SCDs Start: 05/21/24 0233   Code Status: DNR/DNI Family Communication: Daughter on the phone. Disposition Plan: Status is: Inpatient Remains inpatient appropriate because: N.p.o.,     Consultants:  General surgery Neurology Critical care Palliative care  Procedures:  Intubation  Antimicrobials:  Completed     Objective: Vitals:   05/29/24 2041 05/30/24 0408 05/30/24 0556 05/30/24 0746  BP: (!) 141/53 (!) 159/56  (!) 151/72  Pulse: 66 65  63  Resp: 16 16  15   Temp:  98.2 F (36.8 C)  98.2 F (36.8 C)  TempSrc:    Oral  SpO2: 99% 100%  99%  Weight:   63.8 kg   Height:        Intake/Output Summary (Last 24 hours) at 05/30/2024 1047 Last data filed at 05/30/2024 0800 Gross per 24 hour  Intake 1464.75 ml  Output 1650 ml  Net -185.25 ml   Filed Weights   05/28/24 0422 05/29/24 0500 05/30/24 0556  Weight: 61.2 kg 64.4 kg 63.8 kg    Examination:  General: Very frail and sick looking gentleman, mostly lethargic and unable to interact. Cardiovascular: S1-S2 normal.  Regular rate rhythm.  On room air. Respiratory: Bilateral clear.  On room air. Gastrointestinal: Soft and nontender.  Tolerating tube feeding. Ext: No edema.  No deformities. Neuro: Mostly sleepy.  Moves  extremities on strong stimuli. Does not follow command and does not interact.    Data Reviewed: I have personally reviewed following labs and imaging studies  CBC: Recent Labs  Lab 05/24/24 0457 05/25/24 0331 05/26/24 0551 05/27/24 0554 05/28/24 0524 05/29/24 0452  WBC 16.2* 15.6* 12.1* 13.6* 12.8* 12.4*  NEUTROABS 13.8* 12.9* 9.5*  10.7* 10.5*  --   HGB 8.3* 8.1* 8.2* 8.0* 8.1* 8.1*  HCT 25.1* 24.8* 26.2* 24.6* 24.5* 24.1*  MCV 79.4* 80.0 83.4 79.4* 78.3* 77.5*  PLT 221 227 175 214 228 242   Basic Metabolic Panel: Recent Labs  Lab 05/26/24 0551 05/27/24 0554 05/28/24 0524 05/29/24 0452 05/30/24 0402  NA 151* 147* 144 146* 145  K 4.3 3.9 4.1 4.2 4.3  CL 117* 110 111 112* 114*  CO2 23 23 23 24 25   GLUCOSE 181* 168* 211* 132* 251*  BUN 70* 70* 70* 82* 89*  CREATININE 3.18* 2.82* 2.59* 2.64* 2.66*  CALCIUM  9.1 8.8* 8.8* 8.8* 8.9   GFR: Estimated Creatinine Clearance: 17.3 mL/min (A) (by C-G formula based on SCr of 2.66 mg/dL (H)). Liver Function Tests: No results for input(s): AST, ALT, ALKPHOS, BILITOT, PROT, ALBUMIN in the last 168 hours. No results for input(s): LIPASE, AMYLASE in the last 168 hours. No results for input(s): AMMONIA in the last 168 hours. Coagulation Profile: No results for input(s): INR, PROTIME in the last 168 hours. Cardiac Enzymes: No results for input(s): CKTOTAL, CKMB, CKMBINDEX, TROPONINI in the last 168 hours. BNP (last 3 results) No results for input(s): PROBNP in the last 8760 hours. HbA1C: No results for input(s): HGBA1C in the last 72 hours. CBG: Recent Labs  Lab 05/29/24 1718 05/29/24 2043 05/29/24 2351 05/30/24 0408 05/30/24 0741  GLUCAP 147* 163* 239* 242* 203*   Lipid Profile: No results for input(s): CHOL, HDL, LDLCALC, TRIG, CHOLHDL, LDLDIRECT in the last 72 hours. Thyroid  Function Tests: No results for input(s): TSH, T4TOTAL, FREET4, T3FREE, THYROIDAB in the last 72 hours. Anemia Panel: No results for input(s): VITAMINB12, FOLATE, FERRITIN, TIBC, IRON, RETICCTPCT in the last 72 hours. Sepsis Labs: No results for input(s): PROCALCITON, LATICACIDVEN in the last 168 hours.   Recent Results (from the past 240 hours)  Resp panel by RT-PCR (RSV, Flu A&B, Covid) Anterior Nasal Swab      Status: None   Collection Time: 05/20/24  5:37 PM   Specimen: Anterior Nasal Swab  Result Value Ref Range Status   SARS Coronavirus 2 by RT PCR NEGATIVE NEGATIVE Final   Influenza A by PCR NEGATIVE NEGATIVE Final   Influenza B by PCR NEGATIVE NEGATIVE Final    Comment: (NOTE) The Xpert Xpress SARS-CoV-2/FLU/RSV plus assay is intended as an aid in the diagnosis of influenza from Nasopharyngeal swab specimens and should not be used as a sole basis for treatment. Nasal washings and aspirates are unacceptable for Xpert Xpress SARS-CoV-2/FLU/RSV testing.  Fact Sheet for Patients: BloggerCourse.com  Fact Sheet for Healthcare Providers: SeriousBroker.it  This test is not yet approved or cleared by the [place]  FDA and has been authorized for detection and/or diagnosis of SARS-CoV-2 by FDA under an Emergency Use Authorization (EUA). This EUA will remain in effect (meaning this test can be used) for the duration of the COVID-19 declaration under Section 564(b)(1) of the Act, 21 U.S.C. section 360bbb-3(b)(1), unless the authorization is terminated or revoked.     Resp Syncytial Virus by PCR NEGATIVE NEGATIVE Final    Comment: (NOTE) Fact Sheet for Patients: BloggerCourse.com  Fact Sheet for  Healthcare Providers: SeriousBroker.it  This test is not yet approved or cleared by the [place]  FDA and has been authorized for detection and/or diagnosis of SARS-CoV-2 by FDA under an Emergency Use Authorization (EUA). This EUA will remain in effect (meaning this test can be used) for the duration of the COVID-19 declaration under Section 564(b)(1) of the Act, 21 U.S.C. section 360bbb-3(b)(1), unless the authorization is terminated or revoked.  Performed at [facility], [address] [place], [place] [address]   Blood Culture (routine x 2)     Status: None   Collection Time:  05/20/24  5:37 PM   Specimen: BLOOD  Result Value Ref Range Status   Specimen Description BLOOD SITE NOT SPECIFIED  Final   Special Requests   Final    BOTTLES DRAWN AEROBIC AND ANAEROBIC Blood Culture adequate volume   Culture   Final    NO GROWTH 5 DAYS Performed at [facility], [address], [place], [place] [address]    Report Status 05/25/2024 FINAL  Final  Blood Culture (routine x 2)     Status: None   Collection Time: 05/20/24  6:04 PM   Specimen: BLOOD  Result Value Ref Range Status   Specimen Description BLOOD SITE NOT SPECIFIED  Final   Special Requests   Final    BOTTLES DRAWN AEROBIC AND ANAEROBIC Blood Culture results may not be optimal due to an inadequate volume of blood received in culture bottles   Culture   Final    NO GROWTH 5 DAYS Performed at [facility], [address], [place], [place] [address]    Report Status 05/25/2024 FINAL  Final  MRSA Next Gen by PCR, Nasal     Status: None   Collection Time: 05/21/24  1:18 AM   Specimen: Nasal Mucosa; Nasal Swab  Result Value Ref Range Status   MRSA by PCR Next Gen NOT DETECTED NOT DETECTED Final    Comment: (NOTE) The GeneXpert MRSA Assay (FDA approved for NASAL specimens only), is one component of a comprehensive MRSA colonization surveillance program. It is not intended to diagnose MRSA infection nor to guide or monitor treatment for MRSA infections. Test performance is not FDA approved in patients less than 5 years old. Performed at [facility], [address], [place], [place] [address]   Culture, Respiratory w Gram Stain     Status: None   Collection Time: 05/21/24 11:27 AM   Specimen: Tracheal Aspirate; Respiratory  Result Value Ref Range Status   Specimen Description TRACHEAL ASPIRATE  Final   Special Requests NONE  Final   Gram Stain   Final    MODERATE WBC PRESENT, PREDOMINANTLY PMN FEW GRAM POSITIVE COCCI IN CLUSTERS FEW GRAM NEGATIVE RODS    Culture   Final    FEW  Normal respiratory flora-no Staph aureus or Pseudomonas seen Performed at [facility], [address], [place], [place] [address]    Report Status 05/23/2024 FINAL  Final         Radiology Studies: DG Abd Portable 1V Result Date: 05/29/2024 CLINICAL DATA:  Feeding tube placement EXAM: PORTABLE ABDOMEN - 1 VIEW COMPARISON:  05/28/2024 FINDINGS: Removal of nasogastric tube. Feeding tube terminates in the gastric fundus. Contrast within the stomach. Similar gas-filled loops of large and small bowel. Example small bowel loop at 3.0 cm. No gross free intraperitoneal air. IMPRESSION: Feeding tube terminating at the gastric fundus. Similar borderline small bowel dilatation. Electronically Signed  By: Rockey Kilts M.D.   On: 05/29/2024 16:12   DG Swallowing Func-Speech Pathology Result Date: 05/29/2024 Table formatting from the original result was not included. Modified Barium Swallow Study Patient Details Name: Jonathan Baird MRN: [id number] Date of Birth: 13-Mar-1933 Today's Date: 05/29/2024 HPI/PMH: HPI: 89 year old man who presented to [facility] ED 7/10 for respiratory distress. While awaiting bed placement in ED, patient had a witnessed seizure lasting about 2 minutes with L eye deviation.  Patient remained particularly somnolent and was poorly protecting his airway with gurgling respirations and decision was made to intubate 7/9-7/16 (one way extubation). Pt additionally diagnosed with acute left frontal lobe CVA, SBO that resolved , and aspiration pna. CT on 7/9 (day of admission) showed Scattered airspace opacities are noted in the right lung with consolidation in the right upper and lower lobes. There is consolidation in the left lower lobe with mild airspace disease in  the left upper lobe.  And dilated, fluid filled esophagus. PMHx significant for HTN, HLD, T2DM, stage IV CKD, PVD, R ICA stenosis, colon CA s/p hemicolectomy, dementia. Clinical Impression: Clinical Impression: Very limited study due  to pts poor tolerance of MBS chair. Pt could not hold head up and leaned forward despite assist and cues which contributed to aspiration. Severity of dysaphgia rated as a 3 on DIGEST scale due to gross aspiration of thin liquids, but no pharyngeal residue. Swallowing mechanism impaired by delayed initiation, poor positioning and weak cough response, but otherwise movement of oropharyngeal musculature was very good.  Esophageal sweep after two sips of liquid and two bites of puree relatively unremarkable.  If pts generalized strength improved and pt were more interactive, suspect he may tolerate a wide rage of PO with adequate safety. Will need a repeat study to assess function after further recovery. Pt to continue NPO with ice chips for now. SLP will f/u for therapy. Factors that may increase risk of adverse event in presence of aspiration Noe & Lianne 2021): Factors that may increase risk of adverse event in presence of aspiration Noe & Lianne 2021): Aspiration of thick, dense, and/or acidic materials; Frequent aspiration of large volumes; Weak cough; Dependence for feeding and/or oral hygiene; Limited mobility; Frail or deconditioned; Reduced cognitive function; Poor general health and/or compromised immunity Recommendations/Plan: Swallowing Evaluation Recommendations Swallowing Evaluation Recommendations Recommendations: NPO; Alternative means of nutrition - NG Tube Medication Administration: Via alternative means Postural changes: Stay upright 30-60 min after meals Caregiver Recommendations: Have oral suction available Treatment Plan Treatment Plan Treatment recommendations: Therapy as outlined in treatment plan below Follow-up recommendations: Skilled nursing-short term rehab (<3 hours/day) Treatment frequency: Min 2x/week Treatment duration: 2 weeks Recommendations Recommendations for follow up therapy are one component of a multi-disciplinary discharge planning process, led by the attending  physician.  Recommendations may be updated based on patient status, additional functional criteria and insurance authorization. Assessment: Orofacial Exam: Orofacial Exam Oral Cavity - Dentition: Missing dentition Anatomy: Anatomy: WFL Boluses Administered: Boluses Administered Boluses Administered: Thin liquids (Level 0); Puree  Oral Impairment Domain: Oral Impairment Domain Lip Closure: No labial escape Tongue control during bolus hold: Not tested Bolus transport/lingual motion: Slow tongue motion Oral residue: Complete oral clearance Initiation of pharyngeal swallow : Pyriform sinuses  Pharyngeal Impairment Domain: Pharyngeal Impairment Domain Soft palate elevation: No bolus between soft palate (SP)/pharyngeal wall (PW) Laryngeal elevation: Complete superior movement of thyroid  cartilage with complete approximation of arytenoids to epiglottic petiole Anterior hyoid excursion: Complete anterior movement Epiglottic movement: Complete inversion Laryngeal vestibule closure: Complete,  no air/contrast in laryngeal vestibule Pharyngeal stripping wave : Present - complete Pharyngoesophageal segment opening: Complete distension and complete duration, no obstruction of flow Tongue base retraction: No contrast between tongue base and posterior pharyngeal wall (PPW) Pharyngeal residue: Complete pharyngeal clearance  Esophageal Impairment Domain: No data recorded Pill: No data recorded Penetration/Aspiration Scale Score: Penetration/Aspiration Scale Score 1.  Material does not enter airway: Puree 8.  Material enters airway, passes BELOW cords without attempt by patient to eject out (silent aspiration) : Thin liquids (Level 0) Compensatory Strategies: No data recorded  General Information: Caregiver present: No  Diet Prior to this Study: NPO; Large bore NG tube   No data recorded  Respiratory Status: WFL   Supplemental O2: None (Room air)   History of Recent Intubation: Yes  Behavior/Cognition: Requires cueing Self-Feeding  Abilities: Dependent for feeding Baseline vocal quality/speech: Hypophonia/low volume Volitional Cough: Able to elicit Volitional Swallow: Able to elicit Exam Limitations: Poor positioning; Fatigue Goal Planning: Prognosis for improved oropharyngeal function: Fair No data recorded No data recorded No data recorded No data recorded Pain: Pain Assessment Pain Assessment: Faces Faces Pain Scale: 0 End of Session: Start Time:SLP Start Time (ACUTE ONLY): 1100 Stop Time: SLP Stop Time (ACUTE ONLY): 1118 Time Calculation:SLP Time Calculation (min) (ACUTE ONLY): 18 min Charges: SLP Evaluations $ SLP Speech Visit: 1 Visit SLP Evaluations $BSS Swallow: 1 Procedure $MBS Swallow: 1 Procedure $Swallowing Treatment: 1 Procedure SLP visit diagnosis: SLP Visit Diagnosis: Dysphagia, oropharyngeal phase (R13.12) Past Medical History: Past Medical History: Diagnosis Date  Carotid artery stenosis   Right  CKD (chronic kidney disease), stage IV (HCC)   Colon cancer (HCC) 10/12/2004  stage III, s/p hemicolectomy  Dementia (HCC)   Diabetes mellitus   HLD (hyperlipidemia)   HTN (hypertension)   Peripheral vascular disease (HCC)  Past Surgical History: Past Surgical History: Procedure Laterality Date  hemocolectomy  11/13/2003 [place], Consuelo Fitch 05/29/2024, 1:41 PM       Scheduled Meds:  amLODipine   10 mg Per Tube Daily   aspirin   81 mg Per Tube Daily   Chlorhexidine  Gluconate Cloth  6 each Topical Daily   docusate  100 mg Per Tube BID   free water   200 mL Per Tube Q4H   heparin   5,000 Units Subcutaneous Q8H   insulin  aspart  0-15 Units Subcutaneous Q4H   insulin  glargine-yfgn  10 Units Subcutaneous Daily   labetalol   100 mg Per Tube BID   nutrition supplement (JUVEN)  1 packet Per Tube BID BM   mouth rinse  15 mL Mouth Rinse 4 times per day   pantoprazole  (PROTONIX ) IV  40 mg Intravenous Daily   polyethylene glycol  17 g Per Tube Daily   sodium chloride  flush  3 mL Intravenous Q12H   Continuous Infusions:   feeding supplement (VITAL 1.5 CAL) 45 mL/hr at 05/30/24 0800     LOS: 9 days    Time spent: 55 minutes    Renato Applebaum, MD [facility]

## 2024-05-30 NOTE — Plan of Care (Signed)
  Problem: Activity: Goal: Ability to tolerate increased activity will improve Outcome: Progressing   Problem: Respiratory: Goal: Ability to maintain a clear airway and adequate ventilation will improve Outcome: Progressing   Problem: Role Relationship: Goal: Method of communication will improve Outcome: Progressing   Problem: Education: Goal: Knowledge of General Education information will improve Description: Including pain rating scale, medication(s)/side effects and non-pharmacologic comfort measures Outcome: Progressing   Problem: Health Behavior/Discharge Planning: Goal: Ability to manage health-related needs will improve Outcome: Progressing   Problem: Clinical Measurements: Goal: Ability to maintain clinical measurements within normal limits will improve Outcome: Progressing Goal: Diagnostic test results will improve Outcome: Progressing

## 2024-05-31 DIAGNOSIS — Z515 Encounter for palliative care: Secondary | ICD-10-CM | POA: Diagnosis not present

## 2024-05-31 DIAGNOSIS — K56609 Unspecified intestinal obstruction, unspecified as to partial versus complete obstruction: Secondary | ICD-10-CM | POA: Diagnosis not present

## 2024-05-31 LAB — BASIC METABOLIC PANEL WITH GFR
Anion gap: 12 (ref 5–15)
BUN: 86 mg/dL — ABNORMAL HIGH (ref 8–23)
CO2: 22 mmol/L (ref 22–32)
Calcium: 9.3 mg/dL (ref 8.9–10.3)
Chloride: 113 mmol/L — ABNORMAL HIGH (ref 98–111)
Creatinine, Ser: 2.63 mg/dL — ABNORMAL HIGH (ref 0.61–1.24)
GFR, Estimated: 23 mL/min — ABNORMAL LOW (ref >60)
Glucose, Bld: 201 mg/dL — ABNORMAL HIGH (ref 70–99)
Potassium: 4.4 mmol/L (ref 3.5–5.1)
Sodium: 147 mmol/L — ABNORMAL HIGH (ref 135–145)

## 2024-05-31 LAB — GLUCOSE, CAPILLARY
Glucose-Capillary: 197 mg/dL — ABNORMAL HIGH (ref 70–99)
Glucose-Capillary: 203 mg/dL — ABNORMAL HIGH (ref 70–99)
Glucose-Capillary: 184 mg/dL — ABNORMAL HIGH (ref 70–99)
Glucose-Capillary: 180 mg/dL — ABNORMAL HIGH (ref 70–99)
Glucose-Capillary: 186 mg/dL — ABNORMAL HIGH (ref 70–99)

## 2024-05-31 MED ORDER — FREE WATER
250.0000 mL | Status: DC
  Administered 2024-05-31 – 2024-06-02 (×12): 250 mL

## 2024-05-31 NOTE — [place] FL2 (Addendum)
 [place]  MEDICAID FL2 LEVEL OF CARE FORM     IDENTIFICATION  Patient Name: Jonathan Baird Birthdate: 08-31-1933 Sex: male Admission Date (Current Location): 05/20/2024  [facility] and [place] Number:  Producer, television/film/video and Address:  [facility] [place], [address] [place], [place] [address]      Provider Number: [id number]  Attending Physician Name and Address:  Raenelle Coria, MD  Relative Name and Phone Number:       Current Level of Care: Hospital Recommended Level of Care: Skilled Nursing Facility Prior Approval Number:    Date Approved/Denied:   PASRR Number: [id number]  Discharge Plan: SNF    Current Diagnoses: Patient Active Problem List   Diagnosis Date Noted   Small bowel obstruction (HCC) 05/21/2024   Acute hypoxemic respiratory failure (HCC) 05/21/2024   Seizure-like activity (HCC) 05/21/2024   Multifocal pneumonia 05/21/2024   Acute respiratory failure with hypoxia (HCC) 05/21/2024   Protein-calorie malnutrition, severe 05/21/2024   Generalized weakness 12/31/2023   History of dementia 12/31/2023   CKD (chronic kidney disease), stage IV (HCC) 12/31/2023   History of carotid artery stenosis 12/31/2023   History of colon cancer status post colectomy 12/31/2023   Hyperkalemia 12/31/2023   Urinary hesitancy 12/31/2023   Syncope 10/19/2023   CAP (community acquired pneumonia) 10/19/2023   Acute renal failure superimposed on stage 4 chronic kidney disease (HCC) 10/19/2023   Acute encephalopathy 10/19/2023   Elevated troponin 10/19/2023   Insulin  dependent type 2 diabetes mellitus (HCC) 10/19/2023   History of vitrectomy 04/30/2022   Abnormal gait 02/13/2022   Altered mental status 02/13/2022   Unspecified visual loss 02/13/2022   Stage 3b chronic kidney disease (HCC) 09/25/2021   Stenosis of carotid artery 09/25/2021   Essential hypertension 01/17/2021   Chronic kidney disease due to hypertension 01/17/2021   Diabetic renal  disease (HCC) 01/17/2021   Diabetic retinopathy associated with type 2 diabetes mellitus (HCC) 01/17/2021   Disorder of arteries and arterioles, unspecified (HCC) 01/17/2021   Drug-induced hypoglycemia without coma 01/17/2021   Adult general medical exam 01/17/2021   Hyperglycemia due to type 2 diabetes mellitus (HCC) 01/17/2021   Long term (current) use of insulin  (HCC) 01/17/2021   Hyperlipidemia 01/17/2021   PVD (peripheral vascular disease) (HCC) 01/17/2021   Controlled diabetes mellitus with stable proliferative retinopathy of both eyes (HCC) 08/10/2020   Right epiretinal membrane 08/10/2020   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 degeneration of both eyes with subfoveal involvement 08/10/2020   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/10/2020   Colon cancer (HCC) 10/12/2004    Orientation RESPIRATION BLADDER Height & Weight     Self, Time  Normal Incontinent Weight: 145 lb 1 oz (65.8 kg) Height:  5' 10 (177.8 cm)  BEHAVIORAL SYMPTOMS/MOOD NEUROLOGICAL BOWEL NUTRITION STATUS      Incontinent Diet (See DC plan)  AMBULATORY STATUS COMMUNICATION OF NEEDS Skin   Extensive Assist Verbally PU Stage and Appropriate Care (multiple pressure wounds)                       Personal Care Assistance Level of Assistance  Bathing, Feeding, Dressing Bathing Assistance: Maximum assistance Feeding assistance: Maximum assistance Dressing Assistance: Maximum assistance     Functional Limitations Info  Sight, Hearing, Speech Sight Info: Adequate Hearing Info: Adequate Speech Info: Adequate    SPECIAL CARE FACTORS FREQUENCY  PT (By licensed PT), OT (By licensed OT)     PT Frequency: 5x a week OT Frequency: 5x a  week            Contractures Contractures Info: Not present    Additional Factors Info  Code Status, Allergies Code Status Info: Full Allergies Info: NKA           Current Medications (05/31/2024):  This is the current hospital active medication list Current  Facility-Administered Medications  Medication Dose Route Frequency Provider Last Rate Last Admin   albuterol  (PROVENTIL ) (2.5 MG/3ML) 0.083% nebulizer solution 2.5 mg  2.5 mg Nebulization Q4H PRN Segars, Dorn, MD       amLODipine  (NORVASC ) tablet 10 mg  10 mg Per Tube Daily Hindel, Leah, MD   10 mg at 05/31/24 1112   aspirin  chewable tablet 81 mg  81 mg Per Tube Daily Raenelle Coria, MD   81 mg at 05/31/24 1112   Chlorhexidine  Gluconate Cloth 2 % PADS 6 each  6 each Topical Daily Isadora Hose, MD   6 each at 05/31/24 1114   dextrose  50 % solution 0-50 mL  0-50 mL Intravenous PRN Melvenia Motto, MD       docusate (COLACE) 50 MG/5ML liquid 100 mg  100 mg Per Tube BID Ilah Krabbe M, PA-C   100 mg at 05/31/24 1113   feeding supplement (VITAL 1.5 CAL) liquid 1,000 mL  1,000 mL Per Tube Continuous Mannam, Praveen, MD 45 mL/hr at 05/30/24 1800 Infusion Verify at 05/30/24 1800   fentaNYL  (SUBLIMAZE ) injection 25 mcg  25 mcg Intravenous Q15 min PRN Ilah Krabbe M, PA-C       fentaNYL  (SUBLIMAZE ) injection 25-100 mcg  25-100 mcg Intravenous Q30 min PRN Ilah Krabbe HERO, PA-C   50 mcg at 05/27/24 0325   free water  250 mL  250 mL Per Tube Q4H Raenelle Coria, MD       heparin  injection 5,000 Units  5,000 Units Subcutaneous Q8H Ilah Krabbe M, PA-C   5,000 Units at 05/31/24 1413   hydrALAZINE  (APRESOLINE ) injection 20 mg  20 mg Intravenous Q4H PRN Paliwal, Aditya, MD   20 mg at 05/27/24 2239   insulin  aspart (novoLOG ) injection 0-15 Units  0-15 Units Subcutaneous Q4H Isadora Hose, MD   3 Units at 05/31/24 1237   insulin  glargine-yfgn (SEMGLEE ) injection 10 Units  10 Units Subcutaneous Daily Hindel, Leah, MD   10 Units at 05/31/24 1109   labetalol  (NORMODYNE ) injection 10 mg  10 mg Intravenous Q2H PRN Paliwal, Ria, MD   10 mg at 05/28/24 9166   labetalol  (NORMODYNE ) tablet 100 mg  100 mg Per Tube BID Valri Collar A, NP   100 mg at 05/31/24 1112   nutrition supplement (JUVEN) (JUVEN)  powder packet 1 packet  1 packet Per Tube BID BM Mannam, Praveen, MD   1 packet at 05/31/24 1413   Oral care mouth rinse  15 mL Mouth Rinse PRN Mannam, Praveen, MD       Oral care mouth rinse  15 mL Mouth Rinse 4 times per day Raenelle Coria, MD   15 mL at 05/31/24 1127   Oral care mouth rinse  15 mL Mouth Rinse PRN Raenelle Coria, MD       pantoprazole  (PROTONIX ) injection 40 mg  40 mg Intravenous Daily Ilah Krabbe M, PA-C   40 mg at 05/31/24 1113   polyethylene glycol (MIRALAX  / GLYCOLAX ) packet 17 g  17 g Per Tube Daily Ilah Krabbe M, PA-C   17 g at 05/31/24 1111   sodium chloride  flush (NS) 0.9 % injection 3 mL  3 mL Intravenous  Q12H Keturah Carrier, MD   3 mL at 05/31/24 1115     Discharge Medications: Please see discharge summary for a list of discharge medications.  Relevant Imaging Results:  Relevant Lab Results:   Additional Information SS# [id number]  Cena Ligas, LCSW

## 2024-05-31 NOTE — TOC Initial Note (Signed)
 Transition of Care [facility]) - Initial/Assessment Note    Patient Details  Name: Jonathan Baird MRN: [id number] Date of Birth: Aug 16, 1933  Transition [facility] Care [facility] CM/SW Contact:    Jonathan Ligas, LCSW Phone Number: 05/31/2024, 11:42 AM  Clinical Narrative:                  CSW received SNF consult. Member Is oriented to himself, CSW spoke with patients daughter Jonathan Baird on the phone.  CSW introduced self and explained role at the hospital. Jonathan Baird reports that PTA member lives at home with her and her mother. Patient was using a rolling walker need some assistance with ADL's.  CSW reviewed PT/OT recommendations for SNF.Jonathan Baird is ok with SNF.  Pt gave CSW permission to fax out to facilities in the area. Pt has no preference of facility at this time. CSW gave pt medicare.gov rating list to review. CSW explained insurance auth process.  CSW will continue to follow.   Expected Discharge Plan: Skilled Nursing Facility Barriers to Discharge: Continued Medical Work up   Patient Goals and CMS Choice Patient states their goals for this hospitalization and ongoing recovery are:: TO go to SNF then return home CMS Medicare.gov Compare Post Acute Care list provided to:: Patient Represenative (must comment) Choice offered to / list presented to : Adult Children      Expected Discharge Plan and Services       Living arrangements for the past 2 months: Single Family Home                                      Prior Living Arrangements/Services Living arrangements for the past 2 months: Single Family Home Lives with:: Adult Children, Spouse Patient language and need for interpreter reviewed:: Yes Do you feel safe going back to the place where you live?: Yes      Need for Family Participation in Patient Care: Yes (Comment) Care giver support system in place?: Yes (comment) Current home services: DME Criminal Activity/Legal Involvement Pertinent to Current Situation/Hospitalization:  No - Comment as needed  Activities of Daily Living   ADL Screening (condition at time of admission) Independently performs ADLs?: Yes (appropriate for developmental age) Is the patient deaf or have difficulty hearing?: No Does the patient have difficulty seeing, even when wearing glasses/contacts?: No Does the patient have difficulty concentrating, remembering, or making decisions?: Yes  Permission Sought/Granted Permission sought to share information with : Facility Medical sales representative, Family Supports Permission granted to share information with : Yes, Verbal Permission Granted  Share Information with NAME: Jonathan Baird, daughter [phone]  Permission granted to share info w AGENCY: SNF        Emotional Assessment Appearance:: Appears stated age Attitude/Demeanor/Rapport: Unable to Assess Affect (typically observed): Unable to Assess Orientation: : Oriented to Self Alcohol / Substance Use: Not Applicable Psych Involvement: No (comment)  Admission diagnosis:  Small bowel obstruction (HCC) [K56.609] Acute respiratory failure with hypoxia (HCC) [J96.01] Acute encephalopathy [G93.40] Seizure-like activity (HCC) [R56.9] Acute hypoxemic respiratory failure (HCC) [J96.01] Multifocal pneumonia [J18.9] Glasgow coma scale total score 9-12, in the field (EMT or ambulance) [R40.2421] Patient Active Problem List   Diagnosis Date Noted   Small bowel obstruction (HCC) 05/21/2024   Acute hypoxemic respiratory failure (HCC) 05/21/2024   Seizure-like activity (HCC) 05/21/2024   Multifocal pneumonia 05/21/2024   Acute respiratory failure with hypoxia (HCC) 05/21/2024   Protein-calorie malnutrition, severe 05/21/2024  Generalized weakness 12/31/2023   History of dementia 12/31/2023   CKD (chronic kidney disease), stage IV (HCC) 12/31/2023   History of carotid artery stenosis 12/31/2023   History of colon cancer status post colectomy 12/31/2023   Hyperkalemia 12/31/2023   Urinary  hesitancy 12/31/2023   Syncope 10/19/2023   CAP (community acquired pneumonia) 10/19/2023   Acute renal failure superimposed on stage 4 chronic kidney disease (HCC) 10/19/2023   Acute encephalopathy 10/19/2023   Elevated troponin 10/19/2023   Insulin  dependent type 2 diabetes mellitus (HCC) 10/19/2023   History of vitrectomy 04/30/2022   Abnormal gait 02/13/2022   Altered mental status 02/13/2022   Unspecified visual loss 02/13/2022   Stage 3b chronic kidney disease (HCC) 09/25/2021   Stenosis of carotid artery 09/25/2021   Essential hypertension 01/17/2021   Chronic kidney disease due to hypertension 01/17/2021   Diabetic renal disease (HCC) 01/17/2021   Diabetic retinopathy associated with type 2 diabetes mellitus (HCC) 01/17/2021   Disorder of arteries and arterioles, unspecified (HCC) 01/17/2021   Drug-induced hypoglycemia without coma 01/17/2021   Adult general medical exam 01/17/2021   Hyperglycemia due to type 2 diabetes mellitus (HCC) 01/17/2021   Long term (current) use of insulin  (HCC) 01/17/2021   Hyperlipidemia 01/17/2021   PVD (peripheral vascular disease) (HCC) 01/17/2021   Controlled diabetes mellitus with stable proliferative retinopathy of both eyes (HCC) 08/10/2020   Right epiretinal membrane 08/10/2020   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 degeneration of both eyes with subfoveal involvement 08/10/2020   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 [place] [address] Phone: [phone] Fax: [phone]  [facility] [address] [place] [address] Phone: [phone] Fax: [phone]     Social Drivers of Health (SDOH) Social History: SDOH Screenings   Food Insecurity: Patient Unable To Answer (05/22/2024)  Housing: Unknown (05/22/2024)   Transportation Needs: Patient Unable To Answer (05/22/2024)  Utilities: Patient Unable To Answer (05/22/2024)  Social Connections: Patient Unable To Answer (05/22/2024)  Tobacco Use: Low Risk  (05/20/2024)   SDOH Interventions: Transportation Interventions: Inpatient TOC   Readmission Risk Interventions     No data to display

## 2024-05-31 NOTE — Progress Notes (Signed)
 PROGRESS NOTE    Jonathan Baird  FMW:[id number] DOB: 03/08/1933 DOA: 05/20/2024 PCP: Verdia Lombard, MD    Brief Narrative:  89 year old gentleman with history of hypertension, hyperlipidemia, type 2 diabetes, CKD stage IV, peripheral vascular disease, right RCA stenosis, colon cancer status post hemicolectomy and dementia admitted with respiratory distress, small bowel obstruction, aspiration pneumonia and septic shock.  While waiting in the emergency room, he had a witnessed seizure lasting about 2 minutes with left eye deviation.  Neuro was consulted.  Patient was intubated on admitted to the ICU. Significant events: 7/9 - Presented to ED with AMS, SOB. 7/10 - PCCM consulted for airway protection, intubated in ED. 7/11-sedation stopped for WUA 7/13-remains off sedation 7/15-palliative care met with patient.  One-way extubation on 7/16. Transferred to medical floor.  Remains lethargic on tube feeding.    Subjective:  Patient seen and examined.  No overnight events.  He was slightly more responsive today.  He was able to follow simple commands and given hands for handshake.  Remains on tube feeding.  No meaningful response to speech evaluation.  Assessment & Plan:   Acute metabolic encephalopathy in the context of underlying dementia and concern for seizures: Seen by neurology.  Loaded with Keppra  and given Ativan . Patient had continuous EEG that did not reveal any seizures. Currently not on any antiepileptics.  Seen by neurology. Patient also found to have acute left frontal lobe infarct.  Seen by stroke team.  Remains on aspirin .   Statins not needed, LDL low.  Acute hypoxemic respiratory failure and aspiration pneumonia: Extubated to room air.  Remains NPO.  Completed 5 days course of Zosyn  and doxycycline . Continue bronchodilator therapy and chest physiotherapy.  Speech therapy following, not ready to swallow yet.  AKI with history of CKD stage IV: Creatinine remains  elevated.  Small bowel obstruction: Resolved without surgical intervention: Now remains on tube feeding.  Goal of care DNR/DNI Allow time for outcomes 7/19, called and updated patient's daughter on the phone.  Palliative care following. Patient is not improving, will not have reliable oral intake. Speech therapy continues to follow.  Given patient's complexity and severe medical conditions, appropriate for palliative and hospice care.   DVT prophylaxis: heparin  injection 5,000 Units Start: 05/21/24 1400 SCDs Start: 05/21/24 0233   Code Status: DNR/DNI Family Communication: None today. Disposition Plan: Status is: Inpatient Remains inpatient appropriate because: N.p.o.,     Consultants:  General surgery Neurology Critical care Palliative care  Procedures:  Intubation  Antimicrobials:  Completed     Objective: Vitals:   05/30/24 2110 05/31/24 0525 05/31/24 0600 05/31/24 0805  BP: (!) 160/61 (!) 169/59  (!) 170/68  Pulse: (!) 57 (!) 57  67  Resp:  17  15  Temp:  98 F (36.7 C)  98.1 F (36.7 C)  TempSrc:  Oral  Oral  SpO2:  100%  100%  Weight:   65.8 kg   Height:        Intake/Output Summary (Last 24 hours) at 05/31/2024 1334 Last data filed at 05/31/2024 1115 Gross per 24 hour  Intake 1926 ml  Output 550 ml  Net 1376 ml   Filed Weights   05/29/24 0500 05/30/24 0556 05/31/24 0600  Weight: 64.4 kg 63.8 kg 65.8 kg    Examination:  General: Frail and sick looking gentleman.  On room air. Cardiovascular: S1-S2 normal.  Regular rate rhythm.  On room air. Respiratory: Bilateral clear.  On room air. Gastrointestinal: Soft and nontender.  Tolerating tube  feeding. Ext: No edema.  No deformities. Neuro: Awake on voice.  Follows simple commands.  Moves all extremities.   No spontaneous interaction.    Data Reviewed: I have personally reviewed following labs and imaging studies  CBC: Recent Labs  Lab 05/25/24 0331 05/26/24 0551 05/27/24 0554  05/28/24 0524 05/29/24 0452  WBC 15.6* 12.1* 13.6* 12.8* 12.4*  NEUTROABS 12.9* 9.5* 10.7* 10.5*  --   HGB 8.1* 8.2* 8.0* 8.1* 8.1*  HCT 24.8* 26.2* 24.6* 24.5* 24.1*  MCV 80.0 83.4 79.4* 78.3* 77.5*  PLT 227 175 214 228 242   Basic Metabolic Panel: Recent Labs  Lab 05/27/24 0554 05/28/24 0524 05/29/24 0452 05/30/24 0402 05/31/24 0735  NA 147* 144 146* 145 147*  K 3.9 4.1 4.2 4.3 4.4  CL 110 111 112* 114* 113*  CO2 23 23 24 25 22   GLUCOSE 168* 211* 132* 251* 201*  BUN 70* 70* 82* 89* 86*  CREATININE 2.82* 2.59* 2.64* 2.66* 2.63*  CALCIUM  8.8* 8.8* 8.8* 8.9 9.3   GFR: Estimated Creatinine Clearance: 18.1 mL/min (A) (by C-G formula based on SCr of 2.63 mg/dL (H)). Liver Function Tests: No results for input(s): AST, ALT, ALKPHOS, BILITOT, PROT, ALBUMIN in the last 168 hours. No results for input(s): LIPASE, AMYLASE in the last 168 hours. No results for input(s): AMMONIA in the last 168 hours. Coagulation Profile: No results for input(s): INR, PROTIME in the last 168 hours. Cardiac Enzymes: No results for input(s): CKTOTAL, CKMB, CKMBINDEX, TROPONINI in the last 168 hours. BNP (last 3 results) No results for input(s): PROBNP in the last 8760 hours. HbA1C: No results for input(s): HGBA1C in the last 72 hours. CBG: Recent Labs  Lab 05/30/24 2022 05/30/24 2330 05/31/24 0500 05/31/24 0759 05/31/24 1207  GLUCAP 223* 151* 197* 203* 184*   Lipid Profile: No results for input(s): CHOL, HDL, LDLCALC, TRIG, CHOLHDL, LDLDIRECT in the last 72 hours. Thyroid  Function Tests: No results for input(s): TSH, T4TOTAL, FREET4, T3FREE, THYROIDAB in the last 72 hours. Anemia Panel: No results for input(s): VITAMINB12, FOLATE, FERRITIN, TIBC, IRON, RETICCTPCT in the last 72 hours. Sepsis Labs: No results for input(s): PROCALCITON, LATICACIDVEN in the last 168 hours.   No results found for this or any previous  visit (from the past 240 hours).        Radiology Studies: DG Abd Portable 1V Result Date: 05/29/2024 CLINICAL DATA:  Feeding tube placement EXAM: PORTABLE ABDOMEN - 1 VIEW COMPARISON:  05/28/2024 FINDINGS: Removal of nasogastric tube. Feeding tube terminates in the gastric fundus. Contrast within the stomach. Similar gas-filled loops of large and small bowel. Example small bowel loop at 3.0 cm. No gross free intraperitoneal air. IMPRESSION: Feeding tube terminating at the gastric fundus. Similar borderline small bowel dilatation. Electronically Signed   By: Rockey Kilts M.D.   On: 05/29/2024 16:12        Scheduled Meds:  amLODipine   10 mg Per Tube Daily   aspirin   81 mg Per Tube Daily   Chlorhexidine  Gluconate Cloth  6 each Topical Daily   docusate  100 mg Per Tube BID   free water   200 mL Per Tube Q4H   heparin   5,000 Units Subcutaneous Q8H   insulin  aspart  0-15 Units Subcutaneous Q4H   insulin  glargine-yfgn  10 Units Subcutaneous Daily   labetalol   100 mg Per Tube BID   nutrition supplement (JUVEN)  1 packet Per Tube BID BM   mouth rinse  15 mL Mouth Rinse 4 times per day  pantoprazole  (PROTONIX ) IV  40 mg Intravenous Daily   polyethylene glycol  17 g Per Tube Daily   sodium chloride  flush  3 mL Intravenous Q12H   Continuous Infusions:  feeding supplement (VITAL 1.5 CAL) 45 mL/hr at 05/30/24 1800     LOS: 10 days    Time spent: 55 minutes    Renato Applebaum, MD [facility]

## 2024-05-31 NOTE — Progress Notes (Signed)
   Palliative Medicine Inpatient Follow Up Note HPI: 89 year old man who presented to [facility] ED 7/10 for respiratory distress. PMHx significant for HTN, HLD, T2DM, stage IV CKD, PVD, R ICA stenosis, colon CA s/p hemicolectomy, dementia. The Palliative care team as been asked to support additional goals of care conversations.   Today's Discussion 05/31/2024  *Please note that this is a verbal dictation therefore any spelling or grammatical errors are due to the Dragon Medical One system interpretation.  Chart reviewed inclusive of vital signs, progress notes, laboratory results, and diagnostic images.   There are no significant nursing concerns which have been identified by staff this morning.   I met with Jonathan Baird at bedside. He is sleepy but appears to be in no distress. Jonathan Baird is able to arouse when spoken to though unable to vocalize much beyond fine.   There was no family present at bedside this morning.   Plan to follow up with patients wife and daughter tomorrow regarding lethargy and poor nutritional intake.   Questions and concerns addressed/Palliative Support Provided.   Objective Assessment: Vital Signs Vitals:   05/31/24 0525 05/31/24 0805  BP: (!) 169/59 (!) 170/68  Pulse: (!) 57 67  Resp: 17 15  Temp: 98 F (36.7 C) 98.1 F (36.7 C)  SpO2: 100% 100%    Intake/Output Summary (Last 24 hours) at 05/31/2024 1018 Last data filed at 05/31/2024 0831 Gross per 24 hour  Intake 2103 ml  Output 1600 ml  Net 503 ml   Last Weight  Most recent update: 05/31/2024  6:27 AM    Weight  65.8 kg (145 lb 1 oz)            Gen:  Elderly AA M chronically ill appearing HEENT: NGT, dry mucous membranes CV: Regular rate and rhythm  PULM:  On mechanical ventilator ABD: soft/nontender  EXT: No edema  Neuro: Opens eyes and follows some direction(s)  SUMMARY OF RECOMMENDATIONS   DNR/DNI  Coretrack in place now -patient remains somnolent  Plan to FU with patients wife and daughter  tomorrow regarding next steps    Ongoing PMT support ________________________________________________________________________ Jonathan Baird [facility] Palliative Medicine Team Team Cell Phone: [phone] Please utilize secure chat with additional questions, if there is no response within 30 minutes please call the above phone number  Time: 30  Palliative Medicine Team providers are available by phone from 7am to 7pm daily and can be reached through the team cell phone.  Should this patient require assistance outside of these hours, please call the patient's attending physician.

## 2024-05-31 NOTE — Progress Notes (Signed)
 Speech Language Pathology Treatment: Dysphagia  Patient Details Name: Jonathan Baird MRN: [id number] DOB: [birth year] Today's Date: 05/31/2024 Time: 8994-8974 SLP Time Calculation (min) (ACUTE ONLY): 20 min  Assessment / Plan / Recommendation Clinical Impression  SLP followed up for PO trials at bedside. Pt did not open eyes during session but was responsive, opening mouth to tactile stimulation and verbalizing yes when asked if agreeable to ice chips multiple times. Pt noted to have left sided drooling upon SLP entrance; suctioned and provided oral care. Pt continues to exhibit reduced saliva management but exhibits good response to cueing and skilled intervention with POs. Pt masticated single ice chips and 1/2 teaspoons of puree x3-4 trials. Oral propulsion appeared more timely with ice chips with palpable suspected swallow. Pt with some coughing at end of POs trials (SLP provided deep suctioning to posterior oral cavity/hypopharyneal area) and large amount of thick clear secretions suctioned out). Pt with improvements in resonance post suction and coughing subsided. Recommend continue PO ice chips with staff as tolerated to assist in more spontaneous swallowing and improved management of saliva. Will continue to closely monitor. Provided telephone update to family.    HPI HPI: 89 year old man who presented to [facility] ED 7/10 for respiratory distress. While awaiting bed placement in ED, patient had a witnessed seizure lasting about 2 minutes with L eye deviation.  Patient remained particularly somnolent and was poorly protecting his airway with gurgling respirations and decision was made to intubate 7/9-7/16 (one way extubation). Pt additionally diagnosed with acute left frontal lobe CVA, SBO that resolved , and aspiration pna. CT on 7/9 (day of admission) showed Scattered airspace opacities are noted in the right lung with consolidation in the right upper and lower lobes. There is consolidation in the  left lower lobe with mild airspace disease in  the left upper lobe.  And dilated, fluid filled esophagus. PMHx significant for HTN, HLD, T2DM, stage IV CKD, PVD, R ICA stenosis, colon CA s/p hemicolectomy, dementia.      SLP Plan  Continue with current plan of care          Recommendations  Diet recommendations: NPO;Other(comment) (ice chips with staff following oral care as tolerated) Medication Administration: Via alternative means                  Oral care QID;Oral care prior to ice chip/H20   Frequent or constant Supervision/Assistance Dysphagia, oropharyngeal phase (R13.12)     Continue with current plan of care     Jonathan HUNT MA, CCC-SLP Acute Rehabilitation Services    05/31/2024, 10:31 AM

## 2024-06-01 DIAGNOSIS — Z515 Encounter for palliative care: Secondary | ICD-10-CM | POA: Diagnosis not present

## 2024-06-01 DIAGNOSIS — Z7189 Other specified counseling: Secondary | ICD-10-CM | POA: Diagnosis not present

## 2024-06-01 LAB — GLUCOSE, CAPILLARY
Glucose-Capillary: 145 mg/dL — ABNORMAL HIGH (ref 70–99)
Glucose-Capillary: 194 mg/dL — ABNORMAL HIGH (ref 70–99)
Glucose-Capillary: 214 mg/dL — ABNORMAL HIGH (ref 70–99)
Glucose-Capillary: 226 mg/dL — ABNORMAL HIGH (ref 70–99)
Glucose-Capillary: 230 mg/dL — ABNORMAL HIGH (ref 70–99)
Glucose-Capillary: 264 mg/dL — ABNORMAL HIGH (ref 70–99)

## 2024-06-01 MED ORDER — LABETALOL HCL 100 MG PO TABS
100.0000 mg | ORAL_TABLET | Freq: Two times a day (BID) | ORAL | Status: DC
  Administered 2024-06-01 – 2024-06-04 (×5): 100 mg via ORAL
  Filled 2024-06-01 (×7): qty 1

## 2024-06-01 MED ORDER — NEPRO/CARBSTEADY PO LIQD
237.0000 mL | Freq: Two times a day (BID) | ORAL | Status: DC
  Administered 2024-06-01 – 2024-06-04 (×7): 237 mL via ORAL

## 2024-06-01 MED ORDER — METHOCARBAMOL 500 MG PO TABS
500.0000 mg | ORAL_TABLET | Freq: Once | ORAL | Status: AC
  Administered 2024-06-01: 500 mg

## 2024-06-01 MED ORDER — POLYETHYLENE GLYCOL 3350 17 G PO PACK
17.0000 g | PACK | Freq: Every day | ORAL | Status: DC
  Administered 2024-06-02 – 2024-06-04 (×3): 17 g via ORAL
  Filled 2024-06-01 (×2): qty 1

## 2024-06-01 MED ORDER — JUVEN PO PACK: 1.0000 | PACK | Freq: Two times a day (BID) | ORAL | Status: DC

## 2024-06-01 MED ORDER — METHOCARBAMOL 500 MG PO TABS
500.0000 mg | ORAL_TABLET | Freq: Once | ORAL | Status: DC
  Filled 2024-06-01: qty 1

## 2024-06-01 MED ORDER — ASPIRIN 81 MG PO CHEW
81.0000 mg | CHEWABLE_TABLET | Freq: Every day | ORAL | Status: DC
  Administered 2024-06-02 – 2024-06-04 (×3): 81 mg via ORAL
  Filled 2024-06-01 (×3): qty 1

## 2024-06-01 MED ORDER — VITAL 1.5 CAL PO LIQD
1000.0000 mL | ORAL | Status: DC
  Administered 2024-06-01: 1000 mL
  Filled 2024-06-01: qty 1000

## 2024-06-01 MED ORDER — AMLODIPINE BESYLATE 10 MG PO TABS
10.0000 mg | ORAL_TABLET | Freq: Every day | ORAL | Status: DC
  Administered 2024-06-02 – 2024-06-04 (×3): 10 mg via ORAL
  Filled 2024-06-01 (×3): qty 1

## 2024-06-01 MED ORDER — ADULT MULTIVITAMIN W/MINERALS CH
1.0000 | ORAL_TABLET | Freq: Every day | ORAL | Status: DC
  Administered 2024-06-01 – 2024-06-04 (×4): 1 via ORAL
  Filled 2024-06-01 (×4): qty 1

## 2024-06-01 MED ORDER — JUVEN PO PACK
1.0000 | PACK | Freq: Two times a day (BID) | ORAL | Status: DC
  Filled 2024-06-01: qty 1

## 2024-06-01 NOTE — Inpatient Diabetes Management (Signed)
 Inpatient Diabetes Program Recommendations  AACE/ADA: New Consensus Statement on Inpatient Glycemic Control (2015)  Target Ranges:  Prepandial:   less than 140 mg/dL      Peak postprandial:   less than 180 mg/dL (1-2 hours)      Critically ill patients:  140 - 180 mg/dL   Lab Results  Component Value Date   GLUCAP 226 (H) 06/01/2024   HGBA1C 7.6 (H) 05/21/2024    Review of Glycemic Control  Latest Reference Range & Units 05/31/24 07:59 05/31/24 12:07 05/31/24 16:30 05/31/24 20:16 06/01/24 00:02 06/01/24 04:07 06/01/24 08:08  Glucose-Capillary 70 - 99 mg/dL 796 (H) 815 (H) 819 (H) 186 (H) 214 (H) 264 (H) 226 (H)   Current orders for Inpatient glycemic control:  Semglee  10 units Daily Novolog  0-15 units Q4 hours Vital 1.5 45 ml/hour  Inpatient Diabetes Program Recommendations:    -   Add Novolog  3 units Q4 hours tube feed coverage (Do not give if tube feeds are stopped or held)  Thanks,  Clotilda Bull RN, MSN, BC-ADM Inpatient Diabetes Coordinator Team Pager [phone] (8a-5p)

## 2024-06-01 NOTE — Plan of Care (Signed)
°  Problem: Education: °Goal: Knowledge of General Education information will improve °Description: Including pain rating scale, medication(s)/side effects and non-pharmacologic comfort measures °Outcome: Progressing °  °Problem: Nutrition: °Goal: Adequate nutrition will be maintained °Outcome: Progressing °  °Problem: Skin Integrity: °Goal: Risk for impaired skin integrity will decrease °Outcome: Progressing °  °

## 2024-06-01 NOTE — Progress Notes (Signed)
 Palliative Medicine Inpatient Follow Up Note HPI: 89 year old man who presented to [facility] ED 7/10 for respiratory distress. PMHx significant for HTN, HLD, T2DM, stage IV CKD, PVD, R ICA stenosis, colon CA s/p hemicolectomy, dementia. The Palliative care team as been asked to support additional goals of care conversations.   Today's Discussion 06/01/2024  *Please note that this is a verbal dictation therefore any spelling or grammatical errors are due to the Dragon Medical One system interpretation.  Chart reviewed inclusive of vital signs, progress notes, laboratory results, and diagnostic images.   Patient RN, Jonathan Baird met at bedside. She shares no concerns this morning. Patient has been arousable for her.   I met with Jonathan Baird at bedside. He is awake and alert to self. He does share that he feels hungry this morning. He is not in any distress. I reviewed the importance of nutrition with him.   Per speech therapy evaluation he has been advanced to puree with nectar thick liquids.   I have called patients daughter, Jonathan Baird this morning though the phone rang through without a VM set up. I will continue to call to support additional decisions moving forward.  _________________________ Addendum:  I met at bedside with Jonathan Baird, his wife, Jonathan Baird we discussed that they were present for his swallow evaluation and are aware of him passing. They shares that as of yesterday they were told he would go to rehabilitation from here.   We discussed much of the future course will depend on whether or not [name] is able to eat and drink enough to sustain himself. If he is then SNF is very reasonable. If he is not then hospice would be a likely next step. I described hospice as a service for patients who have a life expectancy of 6 months or less.  The goal of hospice is the preservation of dignity and quality at the end phases of life.  Under hospice care, the focus changes from curative to symptom relief.    At this time patients family would like to give him more time to see if improvements can be made.   Questions and concerns addressed/Palliative Support Provided.   Objective Assessment: Vital Signs Vitals:   06/01/24 0726 06/01/24 0929  BP: (!) 147/67 (!) 164/59  Pulse: 69   Resp: 16   Temp: 98.6 F (37 C)   SpO2: 100%     Intake/Output Summary (Last 24 hours) at 06/01/2024 1144 Last data filed at 06/01/2024 9078 Gross per 24 hour  Intake 2315 ml  Output 2200 ml  Net 115 ml   Last Weight  Most recent update: 06/01/2024  6:26 AM    Weight  64.3 kg (141 lb 12.1 oz)            Gen:  Elderly AA M chronically ill appearing HEENT: Core-track, dry mucous membranes CV: Regular rate and rhythm  PULM:  On RA, breathing is even and nonlabored ABD: soft/nontender  EXT: No edema  Neuro: Opens eyes and follows some direction(s), awakens more easily  SUMMARY OF RECOMMENDATIONS   DNR/DNI  Coretrack in place    Patients passed bedside swallow today  Patients family would like to see if he is able to improve to adequate PO's for SNF placement  Discussed if patient is unable to sustain nutritionally the idea of hospice    Ongoing PMT support ________________________________________________________________________ Jonathan Baird [facility] Palliative Medicine Team Team Cell Phone: [phone] Please utilize secure chat with additional questions, if there is no response  within 30 minutes please call the above phone number  High in the setting of goals of care conversations  Palliative Medicine Team providers are available by phone from 7am to 7pm daily and can be reached through the team cell phone.  Should this patient require assistance outside of these hours, please call the patient's attending physician.

## 2024-06-01 NOTE — Plan of Care (Signed)
  Problem: Role Relationship: Goal: Method of communication will improve Outcome: Not Progressing   Problem: Activity: Goal: Risk for activity intolerance will decrease Outcome: Not Progressing   Problem: Nutrition: Goal: Adequate nutrition will be maintained Outcome: Progressing   Problem: Health Behavior/Discharge Planning: Goal: Goals will be collaboratively established with patient/family Outcome: Progressing   Problem: Self-Care: Goal: Ability to communicate needs accurately will improve Outcome: Not Progressing    Pt is able to open eyes most of the time but not able to express concerns.

## 2024-06-01 NOTE — Progress Notes (Signed)
 PROGRESS NOTE    Jonathan Baird  FMW:[id number] DOB: 19-Aug-1933 DOA: 05/20/2024 PCP: Jonathan Lombard, MD    Brief Narrative:  89 year old gentleman with history of hypertension, hyperlipidemia, type 2 diabetes, CKD stage IV, peripheral vascular disease, right RCA stenosis, colon cancer status post hemicolectomy and dementia admitted with respiratory distress, small bowel obstruction, aspiration pneumonia and septic shock.  While waiting in the emergency room, he had a witnessed seizure lasting about 2 minutes with left eye deviation.  Neuro was consulted.  Patient was intubated on admitted to the ICU. Significant events: 7/9 - Presented to ED with AMS, SOB. 7/10 - PCCM consulted for airway protection, intubated in ED. 7/11-sedation stopped for WUA 7/13-remains off sedation 7/15-palliative care met with patient.  One-way extubation on 7/16. Transferred to medical floor.  Remains lethargic on tube feeding.    Subjective:  Patient seen and examined.  He just came back from speech therapy evaluation.  He was more awake and was able to have minimal conversation today.  He did fairly well on modified diet today and speech therapy started him on dysphagia 1 diet with nectar thick liquid. Family decided to give him few more days to monitor symptoms and potentially go to SNF.  Assessment & Plan:   Acute metabolic encephalopathy in the context of underlying dementia and concern for seizures: Seen by neurology.  Loaded with Keppra  and given Ativan . Patient had continuous EEG that did not reveal any seizures. Currently not on any antiepileptics.  Seen by neurology. Patient also found to have acute left frontal lobe infarct.  Seen by stroke team.  Remains on aspirin .   Statins not needed, LDL low.  Acute hypoxemic respiratory failure and aspiration pneumonia: Extubated to room air.  Remains NPO.  Completed 5 days course of Zosyn  and doxycycline . Continue bronchodilator therapy and chest  physiotherapy.  Speech therapy following, not ready to swallow yet.  AKI with history of CKD stage IV: Creatinine remains elevated.  Recheck tomorrow morning.  Small bowel obstruction: Resolved without surgical intervention: Now remains on tube feeding.  Goal of care DNR/DNI Allow time for outcomes. Palliative care team following. Starting on dysphagia 1 diet with nectar thick liquids.  Encourage oral nutrition.  Continue core track feeding today.  If he is able to have adequate intake, will take out in next 24 to 48 hours and transfer him to a SNF.  If no appropriate oral intake, will qualify for hospice care.   DVT prophylaxis: heparin  injection 5,000 Units Start: 05/21/24 1400 SCDs Start: 05/21/24 0233   Code Status: DNR/DNI Family Communication: None today.  Palliative care team following. Disposition Plan: Status is: Inpatient Remains inpatient appropriate because: Inadequate oral intake.     Consultants:  General surgery Neurology Critical care Palliative care  Procedures:  Intubation  Antimicrobials:  Completed     Objective: Vitals:   06/01/24 0254 06/01/24 0500 06/01/24 0726 06/01/24 0929  BP: (!) 170/60 (!) 149/47 (!) 147/67 (!) 164/59  Pulse:  68 69   Resp:  18 16   Temp:  98.6 F (37 C) 98.6 F (37 C)   TempSrc:  Oral Oral   SpO2:  97% 100%   Weight:  64.3 kg    Height:        Intake/Output Summary (Last 24 hours) at 06/01/2024 1245 Last data filed at 06/01/2024 1221 Gross per 24 hour  Intake 2935 ml  Output 2200 ml  Net 735 ml   Filed Weights   05/30/24 0556 05/31/24 0600  06/01/24 0500  Weight: 63.8 kg 65.8 kg 64.3 kg    Examination:  General: Frail looking gentleman not in any distress. Cardiovascular: S1-S2 normal.  Regular rate rhythm.  On room air. Respiratory: Bilateral clear.  On room air. Gastrointestinal: Soft and nontender.  Tolerating tube feeding. Ext: No edema.  No deformities. Neuro: More alert today.  Answers few basic  questions however difficult to understand.  Follows commands and looks comfortable.   Data Reviewed: I have personally reviewed following labs and imaging studies  CBC: Recent Labs  Lab 05/26/24 0551 05/27/24 0554 05/28/24 0524 05/29/24 0452  WBC 12.1* 13.6* 12.8* 12.4*  NEUTROABS 9.5* 10.7* 10.5*  --   HGB 8.2* 8.0* 8.1* 8.1*  HCT 26.2* 24.6* 24.5* 24.1*  MCV 83.4 79.4* 78.3* 77.5*  PLT 175 214 228 242   Basic Metabolic Panel: Recent Labs  Lab 05/27/24 0554 05/28/24 0524 05/29/24 0452 05/30/24 0402 05/31/24 0735  NA 147* 144 146* 145 147*  K 3.9 4.1 4.2 4.3 4.4  CL 110 111 112* 114* 113*  CO2 23 23 24 25 22   GLUCOSE 168* 211* 132* 251* 201*  BUN 70* 70* 82* 89* 86*  CREATININE 2.82* 2.59* 2.64* 2.66* 2.63*  CALCIUM  8.8* 8.8* 8.8* 8.9 9.3   GFR: Estimated Creatinine Clearance: 17.7 mL/min (A) (by C-G formula based on SCr of 2.63 mg/dL (H)). Liver Function Tests: No results for input(s): AST, ALT, ALKPHOS, BILITOT, PROT, ALBUMIN in the last 168 hours. No results for input(s): LIPASE, AMYLASE in the last 168 hours. No results for input(s): AMMONIA in the last 168 hours. Coagulation Profile: No results for input(s): INR, PROTIME in the last 168 hours. Cardiac Enzymes: No results for input(s): CKTOTAL, CKMB, CKMBINDEX, TROPONINI in the last 168 hours. BNP (last 3 results) No results for input(s): PROBNP in the last 8760 hours. HbA1C: No results for input(s): HGBA1C in the last 72 hours. CBG: Recent Labs  Lab 05/31/24 2016 06/01/24 0002 06/01/24 0407 06/01/24 0808 06/01/24 1200  GLUCAP 186* 214* 264* 226* 194*   Lipid Profile: No results for input(s): CHOL, HDL, LDLCALC, TRIG, CHOLHDL, LDLDIRECT in the last 72 hours. Thyroid  Function Tests: No results for input(s): TSH, T4TOTAL, FREET4, T3FREE, THYROIDAB in the last 72 hours. Anemia Panel: No results for input(s): VITAMINB12, FOLATE, FERRITIN,  TIBC, IRON, RETICCTPCT in the last 72 hours. Sepsis Labs: No results for input(s): PROCALCITON, LATICACIDVEN in the last 168 hours.   No results found for this or any previous visit (from the past 240 hours).        Radiology Studies: No results found.       Scheduled Meds:  amLODipine   10 mg Per Tube Daily   aspirin   81 mg Per Tube Daily   Chlorhexidine  Gluconate Cloth  6 each Topical Daily   docusate  100 mg Per Tube BID   free water   250 mL Per Tube Q4H   heparin   5,000 Units Subcutaneous Q8H   insulin  aspart  0-15 Units Subcutaneous Q4H   insulin  glargine-yfgn  10 Units Subcutaneous Daily   labetalol   100 mg Per Tube BID   nutrition supplement (JUVEN)  1 packet Per Tube BID BM   mouth rinse  15 mL Mouth Rinse 4 times per day   pantoprazole  (PROTONIX ) IV  40 mg Intravenous Daily   polyethylene glycol  17 g Per Tube Daily   sodium chloride  flush  3 mL Intravenous Q12H   Continuous Infusions:  feeding supplement (VITAL 1.5 CAL) 1,000 mL (05/31/24  1535)     LOS: 11 days    Time spent: 45 minutes    Renato Applebaum, MD [facility]

## 2024-06-01 NOTE — Progress Notes (Signed)
 Speech Language Pathology Treatment: Dysphagia  Patient Details Name: Jonathan Baird MRN: [id number] DOB: [birth year] Today's Date: 06/01/2024 Time: 1040-1109 SLP Time Calculation (min) (ACUTE ONLY): 29 min  Assessment / Plan / Recommendation Clinical Impression  Wife and daughter at bedside. Pt asleep, but wakes up easily, again with retained oral secretions and wet vocal quality. Pt can cough and clear throat on command with some improvement. Requests ice with eyes closed, but attends to it well. Offered small straw sips of water  resulting in immediate cough, subjectively a bit stronger than Friday. SLP then fed and cued some self feeding of puree, which pt attended to very well. Pt also opened eyes more, especially when Didier came in and spoke to him. Wife and daughter report that at baseline pt spends a lot of time sleeping, but does feed himself. With assist pt was able to sip nectar thick liquids from a straw without signs of aspiration. Wife and daughter also fed pt some pudding and more water . Pt ok to eat and drink partially reclined; in this position, his head and neck are at 90 degrees though trunk is at 45 due to very poor neck strength. Will advance diet to puree and nectar to determine if arousal and attention improve with meals and more meaningful interactions.    HPI HPI: 89 year old man who presented to [facility] ED 7/10 for respiratory distress. While awaiting bed placement in ED, patient had a witnessed seizure lasting about 2 minutes with L eye deviation.  Patient remained particularly somnolent and was poorly protecting his airway with gurgling respirations and decision was made to intubate 7/9-7/16 (one way extubation). Pt additionally diagnosed with acute left frontal lobe CVA, SBO that resolved , and aspiration pna. CT on 7/9 (day of admission) showed Scattered airspace opacities are noted in the right lung with consolidation in the right upper and lower lobes. There is consolidation in  the left lower lobe with mild airspace disease in  the left upper lobe.  And dilated, fluid filled esophagus. PMHx significant for HTN, HLD, T2DM, stage IV CKD, PVD, R ICA stenosis, colon CA s/p hemicolectomy, dementia.      SLP Plan  Continue with current plan of care          Recommendations  Diet recommendations: Dysphagia 1 (puree);Nectar-thick liquid Liquids provided via: Straw;Cup Medication Administration: Via alternative means Supervision: Trained caregiver to feed patient;Full supervision/cueing for compensatory strategies Compensations: Slow rate;Small sips/bites Postural Changes and/or Swallow Maneuvers:  (partially reclined, but head upright ok)                  Oral care QID;Oral care prior to ice chip/H20   Frequent or constant Supervision/Assistance Dysphagia, oropharyngeal phase (R13.12)     Continue with current plan of care     Lavelle Berland, Consuelo Fitch  06/01/2024, 11:13 AM

## 2024-06-01 NOTE — Progress Notes (Signed)
 Nutrition Follow-up  DOCUMENTATION CODES:   Severe malnutrition in context of chronic illness (dementia, CKD IV)  INTERVENTION:  Transition to nocturnal tube feeding via Cortrak: Vital 1.5 at 50 ml/hr x 12 hours from 1800-0600 (600 ml /day) 250 ml Q4H free water  flushes per MD Regimen provides 900 kcal, 40 gm protein, 458 ml water  (1958 ml water  TF + FWF) 1 packet Juven BID per tube, each packet provides 95 calories, 2.5 grams of protein (collagen) + micronutrients to support wound healing MVI with minerals daily po Nepro Shake po BID, each supplement provides 425 kcal and 19 grams protein Magic cup TID with meals, each supplement provides 290 kcal and 9 grams of protein 48 hour calorie count   NUTRITION DIAGNOSIS:   Severe Malnutrition related to chronic illness (dementia, CKD IV) as evidenced by severe muscle depletion, severe fat depletion. - Still applicable   GOAL:   Patient will meet greater than or equal to 90% of their needs - Meeting via TF's  MONITOR:   Vent status, Diet advancement  REASON FOR ASSESSMENT:   Ventilator    ASSESSMENT:   Pt with hx HTN, HLD, dementia, type 2 diabetes, CKD IV, and PVD. Hx of colon cancer s/p hemicolectomy (2005). Pt admitted from home for acute hypoxemic respiratory failure with a witnessed seizure in the ED. Pt required intubation for airway protection.  7/9 - presented to ED, imaging concerning for SBO, NGT place for decompression 7/10 - intubated 7/11 - trickle feeds initiated 7/16 - extubated 7/18 - NGT removed, Cortrak replaced; tip gastric  7/21 - Dysphagia 1, nectar thick liquids, Nocturnal tube feeds, calorie count started   Pt just advanced to DYS1, nectar thick liquids today. Received consult from palliative for nocturnal tube feeds.   RD saw pt with RN, pt was asleep and did not open eyes or respond. No family at bedside. Per RN pt was awake and able to feed himself earlier but did tire easily. Had around 40-50% of  pork, mac n cheese, broccoli, peaches, and a couple bites of his Magic cup for lunch.. RD was unable to acquire flavor preferences.   Per GOC today, family would like to see if pt will increase his PO intake to be able to go to SNF vs Hospice. RD will add ONS and conduct calorie count. RD will have tube feeds meet 50% of needs in hopes patient's intake will increase. RD will continue to provide Juven through tube to aide in wounds as unknown if pt will drink orally. No N/V noted.   Admit weight: 65 kg Current weight: 64.3 kg    Average Meal Intake: 7/21 40-50% x 1 meal   Nutritionally Relevant Medications: Scheduled Meds:  feeding supplement (NEPRO CARB STEADY)  237 mL Oral BID BM   free water   250 mL Per Tube Q4H   heparin   5,000 Units Subcutaneous Q8H   insulin  aspart  0-15 Units Subcutaneous Q4H   insulin  glargine-yfgn  10 Units Subcutaneous Daily   labetalol   100 mg Oral BID   multivitamin with minerals  1 tablet Oral Daily   [START ON 06/02/2024] nutrition supplement (JUVEN)  1 packet Per Tube BID BM   Labs Reviewed: Sodium 147 BUN 86 Creatinine 2.63 Albumin 2.1 AST 14 Total protein 5.3 CBG ranges from 180-264 mg/dL over the last 24 hours HgbA1c 7.6   Diet Order:   Diet Order             DIET - DYS 1 Room service appropriate?  No; Fluid consistency: Nectar Thick  Diet effective now                   EDUCATION NEEDS:   Not appropriate for education at this time  Skin:  Skin Assessment: Skin Integrity Issues: Skin Integrity Issues:: DTI DTI: R heel, L heel Stage II: buttocks  Last BM:  7/17 x2, type 6/type 7 large  Height:   Ht Readings from Last 1 Encounters:  05/27/24 5' 10 (1.778 m)    Weight:   Wt Readings from Last 1 Encounters:  06/01/24 64.3 kg    Ideal Body Weight:  75.5 kg  BMI:  Body mass index is 20.34 kg/m.  Estimated Nutritional Needs:   Kcal:  1600-1800  Protein:  75-90g  Fluid:  1.6-1.8L   Olivia Kenning, RD Registered  Dietitian  See Amion for more information

## 2024-06-01 NOTE — Plan of Care (Signed)
  Problem: Nutrition: Goal: Adequate nutrition will be maintained Outcome: Progressing   Problem: Skin Integrity: Goal: Risk for impaired skin integrity will decrease Outcome: Progressing   Problem: Nutrition: Goal: Risk of aspiration will decrease Outcome: Progressing

## 2024-06-02 DIAGNOSIS — Z7189 Other specified counseling: Secondary | ICD-10-CM | POA: Diagnosis not present

## 2024-06-02 DIAGNOSIS — Z515 Encounter for palliative care: Secondary | ICD-10-CM | POA: Diagnosis not present

## 2024-06-02 LAB — BASIC METABOLIC PANEL WITH GFR
Anion gap: 8 (ref 5–15)
BUN: 73 mg/dL — ABNORMAL HIGH (ref 8–23)
CO2: 24 mmol/L (ref 22–32)
Calcium: 8.8 mg/dL — ABNORMAL LOW (ref 8.9–10.3)
Chloride: 107 mmol/L (ref 98–111)
Creatinine, Ser: 2.66 mg/dL — ABNORMAL HIGH (ref 0.61–1.24)
GFR, Estimated: 22 mL/min — ABNORMAL LOW (ref >60)
Glucose, Bld: 196 mg/dL — ABNORMAL HIGH (ref 70–99)
Potassium: 4.4 mmol/L (ref 3.5–5.1)
Sodium: 139 mmol/L (ref 135–145)

## 2024-06-02 LAB — GLUCOSE, CAPILLARY
Glucose-Capillary: 131 mg/dL — ABNORMAL HIGH (ref 70–99)
Glucose-Capillary: 165 mg/dL — ABNORMAL HIGH (ref 70–99)
Glucose-Capillary: 182 mg/dL — ABNORMAL HIGH (ref 70–99)
Glucose-Capillary: 201 mg/dL — ABNORMAL HIGH (ref 70–99)
Glucose-Capillary: 302 mg/dL — ABNORMAL HIGH (ref 70–99)
Glucose-Capillary: 371 mg/dL — ABNORMAL HIGH (ref 70–99)

## 2024-06-02 MED ORDER — VITAL 1.5 CAL PO LIQD
600.0000 mL | ORAL | Status: DC
  Filled 2024-06-02: qty 711

## 2024-06-02 MED ORDER — COLLAGENASE 250 UNIT/GM EX OINT
TOPICAL_OINTMENT | Freq: Every day | CUTANEOUS | Status: DC
  Filled 2024-06-02: qty 30

## 2024-06-02 MED ORDER — JUVEN PO PACK
1.0000 | PACK | Freq: Two times a day (BID) | ORAL | Status: DC
  Administered 2024-06-03 – 2024-06-04 (×4): 1 via ORAL
  Filled 2024-06-02 (×4): qty 1

## 2024-06-02 NOTE — Progress Notes (Signed)
 Speech Language Pathology Treatment: Dysphagia  Patient Details Name: Jonathan Baird MRN: [id number] DOB: 05/30/1933 Today's Date: 06/02/2024 Time: 8864-8774 SLP Time Calculation (min) (ACUTE ONLY): 50 min  Assessment / Plan / Recommendation Clinical Impression  Pt demonstrates significant improvement in arousal today. He was found up in chair asking for something to drink. Pt consumed 100% of his breakfast when fed by NT. Pt observed with cold ensure, pt needed assist for pacing, otherwise he sipped continuously and rapidly. However coughing occurred even with small sips. SLP added thickener to a small amount of ensure and coughing decreased. Pt additionally sipped nectar thick water  from a straw with minimal cough and was fed his lunch meal. He ate 100% of mashed potatoes and meat, but hated the corn. Pt is very attentive to food and eager to eat. Pt also observed accepting his meds crushed in puree. No need for Cortrak if calories are adequate per dietitian. Will f/u, but expect pt to continue to need texture modifications for the ongoing short term.    HPI HPI: 89 year old man who presented to [facility] ED 7/10 for respiratory distress. While awaiting bed placement in ED, patient had a witnessed seizure lasting about 2 minutes with L eye deviation.  Patient remained particularly somnolent and was poorly protecting his airway with gurgling respirations and decision was made to intubate 7/9-7/16 (one way extubation). Pt additionally diagnosed with acute left frontal lobe CVA, SBO that resolved , and aspiration pna. CT on 7/9 (day of admission) showed Scattered airspace opacities are noted in the right lung with consolidation in the right upper and lower lobes. There is consolidation in the left lower lobe with mild airspace disease in  the left upper lobe.  And dilated, fluid filled esophagus. PMHx significant for HTN, HLD, T2DM, stage IV CKD, PVD, R ICA stenosis, colon CA s/p hemicolectomy, dementia.       SLP Plan  Continue with current plan of care          Recommendations  Diet recommendations: Dysphagia 1 (puree);Nectar-thick liquid Liquids provided via: Straw;Cup Medication Administration: Via alternative means Supervision: Trained caregiver to feed patient;Full supervision/cueing for compensatory strategies Compensations: Slow rate;Small sips/bites                  Oral care BID   Frequent or constant Supervision/Assistance Dysphagia, oropharyngeal phase (R13.12)     Continue with current plan of care     Reola Buckles, Consuelo Fitch  06/02/2024, 1:18 PM

## 2024-06-02 NOTE — Progress Notes (Addendum)
 Mattress changed to sizewise.  Updated photos of wounds in chart. Prevalon boots on. WOCN consulted.  See new wound orders.

## 2024-06-02 NOTE — Progress Notes (Signed)
 PROGRESS NOTE    Jonathan Baird  FMW:[id number] DOB: Mar 31, 1933 DOA: 05/20/2024 PCP: Verdia Lombard, MD    Brief Narrative:  89 year old gentleman with history of hypertension, hyperlipidemia, type 2 diabetes, CKD stage IV, peripheral vascular disease, right RCA stenosis, colon cancer status post hemicolectomy and dementia admitted with respiratory distress, small bowel obstruction, aspiration pneumonia and septic shock.  While waiting in the emergency room, he had a witnessed seizure lasting about 2 minutes with left eye deviation.  Neuro was consulted.  Patient was intubated on admitted to the ICU. Significant events: 7/9 - Presented to ED with AMS, SOB. 7/10 - PCCM consulted for airway protection, intubated in ED. 7/11-sedation stopped for WUA 7/13-remains off sedation 7/15-palliative care met with patient.  One-way extubation on 7/16. Transferred to medical floor.  Remains lethargic on tube feeding.    Subjective:  Patient seen and examined.  He is alert awake and trying to talk.  Nursing staff trying to feed him.  Reported that he ate 100% of the breakfast.  Remains on nocturnal tube feeding.  Alertness improving.  Assessment & Plan:   Acute metabolic encephalopathy in the context of underlying dementia and concern for seizures: Seen by neurology.  Loaded with Keppra  and given Ativan . Patient had continuous EEG that did not reveal any seizures. Currently not on any antiepileptics.  Seen by neurology. Patient also found to have acute left frontal lobe infarct.  Seen by stroke team.  Remains on aspirin .   Statins not needed, LDL low.  Acute hypoxemic respiratory failure and aspiration pneumonia: Extubated to room air.  Remains NPO.  Completed 5 days course of Zosyn  and doxycycline . Continue bronchodilator therapy and chest physiotherapy.  Speech therapy following, not ready to swallow yet.  AKI with history of CKD stage IV: Creatinine remains elevated.  Likely his new  baseline.  Small bowel obstruction: Resolved without surgical intervention: Now remains on tube feeding.  Goal of care DNR/DNI Allow time for outcomes. Palliative care team following. Currently eating dysphagia 1 diet with nectar thick liquids.  Monitor oral intake and subsequently discontinue core track.  If patient is eating enough, family wants him to go to a SNF.     DVT prophylaxis: heparin  injection 5,000 Units Start: 05/21/24 1400 SCDs Start: 05/21/24 0233   Code Status: DNR/DNI Family Communication: None today.  Palliative care team following. Disposition Plan: Status is: Inpatient Remains inpatient appropriate because: Inadequate oral intake.     Consultants:  General surgery Neurology Critical care Palliative care  Procedures:  Intubation  Antimicrobials:  Completed     Objective: Vitals:   06/01/24 1959 06/01/24 2211 06/02/24 0502 06/02/24 0837  BP: (!) 134/53 (!) 134/53 (!) 153/51 (!) 152/69  Pulse:  66 69 62  Resp: 17  17 16   Temp: 98.8 F (37.1 C)  98.5 F (36.9 C) (!) 97.5 F (36.4 C)  TempSrc: Oral  Oral   SpO2: 99%  97% 97%  Weight:      Height:        Intake/Output Summary (Last 24 hours) at 06/02/2024 1351 Last data filed at 06/02/2024 1200 Gross per 24 hour  Intake 2115 ml  Output 1550 ml  Net 565 ml   Filed Weights   05/30/24 0556 05/31/24 0600 06/01/24 0500  Weight: 63.8 kg 65.8 kg 64.3 kg    Examination:  General: Frail and debilitated but looks fairly comfortable.  Able to respond appropriately. Cardiovascular: S1-S2 normal.  Regular rate rhythm.  On room air. Respiratory: Bilateral clear.  On room air. Gastrointestinal: Soft and nontender.  Tolerating tube feeding. Ext: No edema.  No deformities. Neuro: More alert.  Answers appropriately.  Not oriented.   Data Reviewed: I have personally reviewed following labs and imaging studies  CBC: Recent Labs  Lab 05/27/24 0554 05/28/24 0524 05/29/24 0452  WBC 13.6* 12.8*  12.4*  NEUTROABS 10.7* 10.5*  --   HGB 8.0* 8.1* 8.1*  HCT 24.6* 24.5* 24.1*  MCV 79.4* 78.3* 77.5*  PLT 214 228 242   Basic Metabolic Panel: Recent Labs  Lab 05/28/24 0524 05/29/24 0452 05/30/24 0402 05/31/24 0735 06/02/24 0610  NA 144 146* 145 147* 139  K 4.1 4.2 4.3 4.4 4.4  CL 111 112* 114* 113* 107  CO2 23 24 25 22 24   GLUCOSE 211* 132* 251* 201* 196*  BUN 70* 82* 89* 86* 73*  CREATININE 2.59* 2.64* 2.66* 2.63* 2.66*  CALCIUM  8.8* 8.8* 8.9 9.3 8.8*   GFR: Estimated Creatinine Clearance: 17.5 mL/min (A) (by C-G formula based on SCr of 2.66 mg/dL (H)). Liver Function Tests: No results for input(s): AST, ALT, ALKPHOS, BILITOT, PROT, ALBUMIN in the last 168 hours. No results for input(s): LIPASE, AMYLASE in the last 168 hours. No results for input(s): AMMONIA in the last 168 hours. Coagulation Profile: No results for input(s): INR, PROTIME in the last 168 hours. Cardiac Enzymes: No results for input(s): CKTOTAL, CKMB, CKMBINDEX, TROPONINI in the last 168 hours. BNP (last 3 results) No results for input(s): PROBNP in the last 8760 hours. HbA1C: No results for input(s): HGBA1C in the last 72 hours. CBG: Recent Labs  Lab 06/01/24 2005 06/02/24 0019 06/02/24 0443 06/02/24 0834 06/02/24 1155  GLUCAP 145* 131* 201* 165* 302*   Lipid Profile: No results for input(s): CHOL, HDL, LDLCALC, TRIG, CHOLHDL, LDLDIRECT in the last 72 hours. Thyroid  Function Tests: No results for input(s): TSH, T4TOTAL, FREET4, T3FREE, THYROIDAB in the last 72 hours. Anemia Panel: No results for input(s): VITAMINB12, FOLATE, FERRITIN, TIBC, IRON, RETICCTPCT in the last 72 hours. Sepsis Labs: No results for input(s): PROCALCITON, LATICACIDVEN in the last 168 hours.   No results found for this or any previous visit (from the past 240 hours).        Radiology Studies: No results found.       Scheduled  Meds:  amLODipine   10 mg Oral Daily   aspirin   81 mg Oral Daily   Chlorhexidine  Gluconate Cloth  6 each Topical Daily   docusate  100 mg Per Tube BID   feeding supplement (NEPRO CARB STEADY)  237 mL Oral BID BM   free water   250 mL Per Tube Q4H   heparin   5,000 Units Subcutaneous Q8H   insulin  aspart  0-15 Units Subcutaneous Q4H   insulin  glargine-yfgn  10 Units Subcutaneous Daily   labetalol   100 mg Oral BID   multivitamin with minerals  1 tablet Oral Daily   nutrition supplement (JUVEN)  1 packet Per Tube BID BM   mouth rinse  15 mL Mouth Rinse 4 times per day   pantoprazole  (PROTONIX ) IV  40 mg Intravenous Daily   polyethylene glycol  17 g Oral Daily   sodium chloride  flush  3 mL Intravenous Q12H   Continuous Infusions:  feeding supplement (VITAL 1.5 CAL)       LOS: 12 days    Time spent: 45 minutes    Renato Applebaum, MD [facility]

## 2024-06-02 NOTE — Progress Notes (Signed)
 Calorie Count Note  Pt doing well since started on nocturnal tube feeds yesterday. Had 100% of both his breakfast and Lunch this morning. Even with no documentation from dinner, pt is meeting 90% of his estimated energy needs. Suspect if Dinner was recorded pt would have exceeded his energy needs. Pt was sleeping on visit. Per speech he was very eager to eat. Reached out to MD and palliative about patient's progress, both in agreement to removed cortrak feeding tube and monitor intake. RD will continue calorie count for 1 more day.   48 hour calorie count ordered.  Diet: Dysphagia 1, Nectar thick liquids  Supplements: Magic cup, Nepro  Day 1: 7/21: Dinner: No meal ticket recorded  7/22:  Breakfast: 100% orange juice, pineapples, grits, scrambled eggs, waffle, milk (715 kcal, 26 gm protein) Lunch:100% Beef with brown gravy, mashed potatoes, 0% corn (265 kcal, 23 gm protein) Supplements: 1 Magic cup, 50% Nepro (500 kcal, 18.5 gm protein)  Total intake: 1,480 kcal (92.5% of minimum estimated needs)  67.5 gm protein (90% of minimum estimated needs)  Estimated Nutritional Needs:  Kcal:  1600-1800 kcal Protein:  75-90 gm Fluid:  1.6-1.8L  INTERVENTION:  Discontinue tube feeds  1 packet Juven BID per PO, each packet provides 95 calories, 2.5 grams of protein (collagen) + micronutrients to support wound healing *Must be thickned to correct consistency  MVI with minerals daily po Nepro Shake po BID, each supplement provides 425 kcal and 19 grams protein Magic cup TID with meals, each supplement provides 290 kcal and 9 grams of protein Continue 48 hour calorie count    NUTRITION DIAGNOSIS:    Severe Malnutrition related to chronic illness (dementia, CKD IV) as evidenced by severe muscle depletion, severe fat depletion. - Still applicable    GOAL:    Patient will meet greater than or equal to 90% of their needs - Meeting via PO  Patsy Zaragoza, RD Registered Dietitian  See Amion  for more information

## 2024-06-02 NOTE — Progress Notes (Signed)
 Cortrak removed as per orders. Tolerated well  Daughter at bedside feeing patient dinner.

## 2024-06-02 NOTE — Progress Notes (Signed)
 Occupational Therapy Treatment Patient Details Name: Jonathan Baird MRN: [id number] DOB: 05/12/1933 Today's Date: 06/02/2024   History of present illness 89 year old man who presented to [facility] ED 7/9 for respiratory distress. 7/10 Noted to have seizure in ED, and required intubation. MRI revealed 2 mm acute infarct within the left frontal lobe One way extubation 7/16 PMHx significant for HTN, HLD, T2DM, stage IV CKD, PVD, R ICA stenosis, colon CA s/p hemicolectomy, dementia   OT comments  Pt progressing toward goals, OOB eating with NT assist upon arrival. Pt needing min-max A+2 for ADLs, and max +2 for standing attempts from chair level with RW, mod +2 with use of Stedy, pt fatguing quickly. Pt performs seated grooming task, noted to have decr attention to  L side, mod cues needed to look to L to place washcloth in therapist's hand. Pt presenting with impairments listed below, will follow acutely. Patient will benefit from continued inpatient follow up therapy, <3 hours/day to maximize safety/ind with ADL/functional mobility.       If plan is discharge home, recommend the following:  Two people to help with walking and/or transfers;Two people to help with bathing/dressing/bathroom;Assistance with cooking/housework;Assistance with feeding;Direct supervision/assist for medications management;Direct supervision/assist for financial management;Assist for transportation;Help with stairs or ramp for entrance   Equipment Recommendations  Other (comment) (defer)    Recommendations for Other Services PT consult    Precautions / Restrictions Precautions Precautions: Fall Recall of Precautions/Restrictions: Impaired Precaution/Restrictions Comments: NG feeding tube Restrictions Weight Bearing Restrictions Per Provider Order: No       Mobility Bed Mobility               General bed mobility comments: OOB in chair upon arrival    Transfers Overall transfer level: Needs  assistance Equipment used: Rolling walker (2 wheels), Ambulation equipment used Transfers: Sit to/from Stand Sit to Stand: Max assist, +2 physical assistance, Via lift equipment, Mod assist           General transfer comment: max +2 for RW, mod +2 with use of Stedy, pt fatigues quickly Transfer via Lift Equipment: Stedy   Balance Overall balance assessment: Needs assistance Sitting-balance support: Feet supported, Bilateral upper extremity supported Sitting balance-Leahy Scale: Zero Sitting balance - Comments: requires outside assist to maintain balance Postural control: Posterior lean   Standing balance-Leahy Scale: Zero Standing balance comment: heavily reliant on external support                           ADL either performed or assessed with clinical judgement   ADL Overall ADL's : Needs assistance/impaired Eating/Feeding: Sitting;Moderate assistance   Grooming: Minimal assistance;Sitting;Moderate assistance   Upper Body Bathing: Sitting;Maximal assistance   Lower Body Bathing: Sitting/lateral leans;Total assistance   Upper Body Dressing : Maximal assistance   Lower Body Dressing: Total assistance   Toilet Transfer: Maximal assistance;+2 for physical assistance   Toileting- Clothing Manipulation and Hygiene: Maximal assistance       Functional mobility during ADLs: Maximal assistance;+2 for physical assistance      Extremity/Trunk Assessment Upper Extremity Assessment Upper Extremity Assessment: Generalized weakness   Lower Extremity Assessment Lower Extremity Assessment: Defer to PT evaluation        Vision   Additional Comments: preference for R gaze, needs mod cues to look to L to hand wash cloth to PT   Perception Perception Perception: Not tested   Praxis Praxis Praxis: Not tested   Communication Communication Communication: Impaired Factors  Affecting Communication: Hearing impaired;Difficulty expressing self;Reduced clarity of  speech   Cognition Arousal: Alert Behavior During Therapy: Flat affect           Attention impairment (select first level of impairment): Sustained attention Executive functioning impairment (select all impairments): Initiation, Sequencing OT - Cognition Comments: states name and DOB, unaware he is at the hospital or current date, no family present this session                 Following commands: Impaired Following commands impaired: Follows one step commands with increased time, Only follows one step commands consistently      Cueing   Cueing Techniques: Verbal cues, Gestural cues, Tactile cues, Visual cues  Exercises      Shoulder Instructions       General Comments VSS    Pertinent Vitals/ Pain       Pain Assessment Pain Assessment: No/denies pain  Home Living                                          Prior Functioning/Environment              Frequency  Min 2X/week        Progress Toward Goals  OT Goals(current goals can now be found in the care plan section)  Progress towards OT goals: Progressing toward goals  Acute Rehab OT Goals OT Goal Formulation: With family Time For Goal Achievement: 06/11/24 Potential to Achieve Goals: Fair ADL Goals Pt Will Perform Grooming: with min assist;sitting Pt Will Perform Upper Body Dressing: with min assist;sitting Pt/caregiver will Perform Home Exercise Program: Increased strength;Both right and left upper extremity;With minimal assist Additional ADL Goal #1: Pt will complete bed mobility with moderate assistance in preparation for ADLs. Additional ADL Goal #2: Pt will demonstrate fair sitting balance as a precursor to ADLs.  Plan      Co-evaluation    PT/OT/SLP Co-Evaluation/Treatment: Yes Reason for Co-Treatment: Complexity of the patient's impairments (multi-system involvement);For patient/therapist safety;To address functional/ADL transfers   OT goals addressed during session:  ADL's and self-care;Strengthening/ROM      AM-PAC OT 6 Clicks Daily Activity     Outcome Measure   Help from another person eating meals?: A Lot Help from another person taking care of personal grooming?: A Lot Help from another person toileting, which includes using toliet, bedpan, or urinal?: Total Help from another person bathing (including washing, rinsing, drying)?: A Lot Help from another person to put on and taking off regular upper body clothing?: A Lot Help from another person to put on and taking off regular lower body clothing?: Total 6 Click Score: 10    End of Session Equipment Utilized During Treatment: Gait belt;Rolling walker (2 wheels);Other (comment) (stedy)  OT Visit Diagnosis: Muscle weakness (generalized) (M62.81);Other symptoms and signs involving cognitive function   Activity Tolerance Patient tolerated treatment well   Patient Left in chair;with call bell/phone within reach;with chair alarm set   Nurse Communication Mobility status        Time: 9050-8985 OT Time Calculation (min): 25 min  Charges: OT General Charges $OT Visit: 1 Visit OT Treatments $Self Care/Home Management : 8-22 mins  Mahogani Holohan K, OTD, OTR/L SecureChat Preferred Acute Rehab [phone]   Laneta POUR Koonce 06/02/2024, 10:31 AM

## 2024-06-02 NOTE — Consult Note (Signed)
 Patient admitted on 7/10 with B DTPI and Stage 2 to buttocks at that time; WOC team consulted 06/02/2024   WOC Nurse Consult Note: Reason for Consult: sacral wound  Wound type: 1.  Stage 3 pressure injury Sacrum 50% red 50% brown/tan necrotic tissue  2.  Full thickness L calf 100% pink  3.  B heel Deep Tissue Pressure Injuries present on admission purple maroon discoloration  Pressure Injury POA: Yes sacral wound red moist on admission; has deteriorated  Measurement: see nursing flowsheet  Wound bed: as above  Drainage (amount, consistency, odor) foul smelling odor to sacral wound  Periwound: Dressing procedure/placement/frequency:  Cleanse sacrum with Vashe wound cleanser (do not rinse and allow to air dry).  Apply 1/4 thick layer of Santyl  to wound bed, top with saline moist gauze, dry gauze and ABD pad and tape.  Cleanse L calf wound with Vashe, apply Xeroform gauze ([place]son [phone]) to wound every other day and secure with silicone foam.  Cleanse B heels with soap and water , dry and apply Xeroform gauze Soila [phone]) to wound beds daily, secure with silicone foam or Kerlix roll gauze whichever is preferred and place B feet in Prevalon boots to offload pressure.   Patient should be placed on a low air loss mattress for pressure redistribution and moisture management.  POC discussed with bedside nurse.  WOC team will follow sacrum every 7 to 10 days to assess and change POC as needed.   Thank you,     Powell Bar MSN, RN-BC, [employer]

## 2024-06-02 NOTE — TOC Progression Note (Signed)
 Transition of Care [facility]) - Progression Note    Patient Details  Name: Jonathan Baird MRN: [id number] Date of Birth: Oct 25, 1933  Transition of Care [facility]) CM/SW Contact  [place] Boyett LITTIE Moose, LCSW Phone Number: 06/02/2024, 2:14 PM  Clinical Narrative:    CSW attempted to call pt spouse, [name], but phone goes straight to voicemail. CSW called pt daughter, [name], about SNF placement once pt is medically ready. [name] provided CSW with her email- CSW emailed list pf approved SNF offers for her to look over.    Expected Discharge Plan: Skilled Nursing Facility Barriers to Discharge: Continued Medical Work up  Expected Discharge Plan and Services       Living arrangements for the past 2 months: Single Family Home                                       Social Determinants of Health (SDOH) Interventions SDOH Screenings   Food Insecurity: Patient Unable To Answer (05/22/2024)  Housing: Unknown (05/22/2024)  Transportation Needs: Patient Unable To Answer (05/22/2024)  Utilities: Patient Unable To Answer (05/22/2024)  Social Connections: Patient Unable To Answer (05/22/2024)  Tobacco Use: Low Risk  (05/20/2024)    Readmission Risk Interventions     No data to display

## 2024-06-02 NOTE — Progress Notes (Signed)
 Physical Therapy Treatment Patient Details Name: Jonathan Baird MRN: [id number] DOB: [birth year] Today's Date: 06/02/2024   History of Present Illness 89 year old man who presented to [facility] ED 7/9 for respiratory distress. 7/10 Noted to have seizure in ED, and required intubation. 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 Pt seen in conjunction with OT to maximize pt activity tolerance and progress transfers. Pt needing max A +2 for standing attempts with RW and stedy frame. Pt with strong posterior lean with all standing attempts needing mod-max A to facilitate anterior weight shift. Pt able to perform seated LE exercises for increased ROM and strength maintenance, however pt fatiguing quickly with with difficulty maintaining attention to task at hand.  Patient will benefit from continued inpatient follow up therapy, <3 hours/day to address deficits and maximize functional independence and decrease caregiver burden. Will continue to follow acutely.     If plan is discharge home, recommend the following: Two people to help with walking and/or transfers;Two people to help with bathing/dressing/bathroom;Assistance with cooking/housework;Assistance with feeding;Direct supervision/assist for medications management;Direct supervision/assist for financial management;Assist for transportation;Help with stairs or ramp for entrance;Supervision due to cognitive status   Can travel by private vehicle     No  Equipment Recommendations  Hospital bed;Hoyer lift;Wheelchair (measurements PT);Wheelchair cushion (measurements PT);BSC/3in1    Recommendations for Other Services       Precautions / Restrictions Precautions Precautions: Fall Recall of Precautions/Restrictions:  Impaired Precaution/Restrictions Comments: NG feeding tube Restrictions Weight Bearing Restrictions Per Provider Order: No     Mobility  Bed Mobility               General bed mobility comments: OOB in chair upon arrival    Transfers Overall transfer level: Needs assistance Equipment used: Rolling walker (2 wheels), Ambulation equipment used Transfers: Sit to/from Stand Sit to Stand: Max assist, +2 physical assistance, Via lift equipment, Mod assist           General transfer comment: max +2 for RW, mod +2 with use of Stedy, pt fatigues quickly, blocking provided at bil feet as pt feet sliding anterior with all standing attempts, strong posterior lean in standing needing mod A +2 to correct Transfer via Lift Equipment: Stedy  Ambulation/Gait                   Stairs             Wheelchair Mobility     Tilt Bed    Modified Rankin (Stroke Patients Only)       Balance Overall balance assessment: Needs assistance Sitting-balance support: Feet supported, Bilateral upper extremity supported Sitting balance-Leahy Scale: Zero Sitting balance - Comments: requires outside assist to maintain balance Postural control: Posterior lean   Standing balance-Leahy Scale: Zero Standing balance comment: heavily reliant on external support                            Communication Communication Communication: Impaired Factors Affecting Communication: Hearing impaired;Difficulty expressing self;Reduced clarity of speech  Cognition Arousal: Alert Behavior During Therapy: Flat affect  Following commands: Impaired Following commands impaired: Follows one step commands with increased time, Only follows one step commands consistently    Cueing Cueing Techniques: Verbal cues, Gestural cues, Tactile cues, Visual cues  Exercises      General Comments General comments (skin integrity, edema, etc.): VSS on RA       Pertinent Vitals/Pain Pain Assessment Pain Assessment: No/denies pain    Home Living                          Prior Function            PT Goals (current goals can now be found in the care plan section) Acute Rehab PT Goals PT Goal Formulation: Patient unable to participate in goal setting Time For Goal Achievement: 06/11/24 Progress towards PT goals: Progressing toward goals    Frequency    Min 1X/week      PT Plan      Co-evaluation PT/OT/SLP Co-Evaluation/Treatment: Yes Reason for Co-Treatment: Complexity of the patient's impairments (multi-system involvement);For patient/therapist safety;To address functional/ADL transfers PT goals addressed during session: Mobility/safety with mobility;Balance;Proper use of DME        AM-PAC PT 6 Clicks Mobility   Outcome Measure  Help needed turning from your back to your side while in a flat bed without using bedrails?: Total Help needed moving from lying on your back to sitting on the side of a flat bed without using bedrails?: Total Help needed moving to and from a bed to a chair (including a wheelchair)?: Total Help needed standing up from a chair using your arms (e.g., wheelchair or bedside chair)?: Total Help needed to walk in hospital room?: Total Help needed climbing 3-5 steps with a railing? : Total 6 Click Score: 6    End of Session Equipment Utilized During Treatment: Gait belt Activity Tolerance: Patient tolerated treatment well;Patient limited by fatigue Patient left: in chair;with call bell/phone within reach;with chair alarm set Nurse Communication: Mobility status PT Visit Diagnosis: Muscle weakness (generalized) (M62.81);Unsteadiness on feet (R26.81);Difficulty in walking, not elsewhere classified (R26.2);Other symptoms and signs involving the nervous system (R29.898);Adult, failure to thrive (R62.7)     Time: 0950-1015 PT Time Calculation (min) (ACUTE ONLY): 25 min  Charges:     $Therapeutic Activity: 8-22 mins PT General Charges $$ ACUTE PT VISIT: 1 Visit                     Jahmir Salo R. PTA Acute Rehabilitation Services Office: [phone]   Therisa CHRISTELLA Boor 06/02/2024, 5:05 PM

## 2024-06-02 NOTE — Progress Notes (Addendum)
   Palliative Medicine Inpatient Follow Up Note HPI: 89 year old man who presented to [facility] ED 7/10 for respiratory distress. PMHx significant for HTN, HLD, T2DM, stage IV CKD, PVD, R ICA stenosis, colon CA s/p hemicolectomy, dementia. The Palliative care team as been asked to support additional goals of care conversations.   Today's Discussion 06/02/2024  *Please note that this is a verbal dictation therefore any spelling or grammatical errors are due to the Dragon Medical One system interpretation.  Chart reviewed inclusive of vital signs, progress notes, laboratory results, and diagnostic images.   I met with Jonathan Baird this morning in the present of his nursing technician. We discussed his present state, notably his sleepiness. He does arouse and in doing so is able to follow directions. We reviewed the importance of him getting OOB for meals. I shared he needs 1:1 feeding.   Plan at this time is allowing time to see if Jonathan Baird's nutritional needs can improve to the point of sustainability. If so family would like to transition to SNF and if not we have broached the topic of home hospice.   Questions and concerns addressed/Palliative Support Provided.   Objective Assessment: Vital Signs Vitals:   06/02/24 0502 06/02/24 0837  BP: (!) 153/51 (!) 152/69  Pulse: 69 62  Resp: 17 16  Temp: 98.5 F (36.9 C) (!) 97.5 F (36.4 C)  SpO2: 97% 97%    Intake/Output Summary (Last 24 hours) at 06/02/2024 9060 Last data filed at 06/02/2024 0554 Gross per 24 hour  Intake 3422 ml  Output 1550 ml  Net 1872 ml   Last Weight  Most recent update: 06/01/2024  6:26 AM    Weight  64.3 kg (141 lb 12.1 oz)            Gen:  Elderly AA M chronically ill appearing HEENT: Core-track, dry mucous membranes CV: Regular rate and rhythm  PULM:  On RA, breathing is even and nonlabored ABD: soft/nontender  EXT: No edema  Neuro: Opens eyes and follows some direction(s), awakens more easily  SUMMARY OF  RECOMMENDATIONS   DNR/DNI  Adult FTT: - Coretrack in place   - Nutrition is involved for a calorie count - 1:1 Feeds  Deconditioning: - Encourage OOB daily  Allowing time for outcomes   Possible SNF (if improves) versus home with hospice (if neglects to improve)    Ongoing PMT support ________________________________________________________________________ [name] [place] Palliative Medicine Team Team Cell Phone: [phone] Please utilize secure chat with additional questions, if there is no response within 30 minutes please call the above phone number  Moderate MDM  Palliative Medicine Team providers are available by phone from 7am to 7pm daily and can be reached through the team cell phone.  Should this patient require assistance outside of these hours, please call the patient's attending physician.

## 2024-06-03 DIAGNOSIS — Z515 Encounter for palliative care: Secondary | ICD-10-CM | POA: Diagnosis not present

## 2024-06-03 DIAGNOSIS — Z7189 Other specified counseling: Secondary | ICD-10-CM | POA: Diagnosis not present

## 2024-06-03 LAB — GLUCOSE, CAPILLARY
Glucose-Capillary: 175 mg/dL — ABNORMAL HIGH (ref 70–99)
Glucose-Capillary: 200 mg/dL — ABNORMAL HIGH (ref 70–99)
Glucose-Capillary: 215 mg/dL — ABNORMAL HIGH (ref 70–99)
Glucose-Capillary: 257 mg/dL — ABNORMAL HIGH (ref 70–99)
Glucose-Capillary: 290 mg/dL — ABNORMAL HIGH (ref 70–99)
Glucose-Capillary: 93 mg/dL (ref 70–99)

## 2024-06-03 MED ORDER — PANTOPRAZOLE SODIUM 40 MG PO TBEC
40.0000 mg | DELAYED_RELEASE_TABLET | Freq: Every day | ORAL | Status: DC
  Administered 2024-06-04: 40 mg via ORAL
  Filled 2024-06-03: qty 1

## 2024-06-03 NOTE — Progress Notes (Signed)
   Palliative Medicine Inpatient Follow Up Note HPI: 89 year old man who presented to [facility] ED 7/10 for respiratory distress. PMHx significant for HTN, HLD, T2DM, stage IV CKD, PVD, R ICA stenosis, colon CA s/p hemicolectomy, dementia. The Palliative care team as been asked to support additional goals of care conversations.   Today's Discussion 06/03/2024  *Please note that this is a verbal dictation therefore any spelling or grammatical errors are due to the Dragon Medical One system interpretation.  Chart reviewed inclusive of vital signs, progress notes, laboratory results, and diagnostic images.   Coretrack is out - does well with 1:1 feeds.   I met with Bettie this morning, he is more awake and alert. He shares that he feels thirsty this morning therefore I helped him drink some of his shake.   Plan for transition to SNF once a family elects which one.   Questions and concerns addressed/Palliative Support Provided.   Objective Assessment: Vital Signs Vitals:   06/03/24 0508 06/03/24 0854  BP: (!) 124/47 (!) 153/56  Pulse: 74 70  Resp: 17 16  Temp: 99.1 F (37.3 C) 97.6 F (36.4 C)  SpO2: 100% 100%    Intake/Output Summary (Last 24 hours) at 06/03/2024 1105 Last data filed at 06/03/2024 0500 Gross per 24 hour  Intake 1450 ml  Output 1000 ml  Net 450 ml   Last Weight  Most recent update: 06/03/2024  5:41 AM    Weight  70.8 kg (156 lb)            Gen:  Elderly AA M chronically ill appearing HEENT: moist mucous membranes CV: Regular rate and rhythm  PULM:  On RA, breathing is even and nonlabored ABD: soft/nontender  EXT: No edema  Neuro: Opens eyes and follows some direction(s), awakens more easily  SUMMARY OF RECOMMENDATIONS   DNR/DNI  Adult FTT: - Nutrition is involved for a calorie count - 1:1 Feeds  Deconditioning: - Encourage OOB daily  Allowing time for outcomes   Plan for SNF placement - family is electing which one  Plan for OP Palliative  support on discharge    The PMT will remain peripheral at this time given patient improvement ________________________________________________________________________ [name] [place] Palliative Medicine Team Team Cell Phone: [phone] Please utilize secure chat with additional questions, if there is no response within 30 minutes please call the above phone number  MDM: Low  Palliative Medicine Team providers are available by phone from 7am to 7pm daily and can be reached through the team cell phone.  Should this patient require assistance outside of these hours, please call the patient's attending physician.

## 2024-06-03 NOTE — TOC Progression Note (Signed)
 Transition of Care [facility]) - Progression Note    Patient Details  Name: GERRITT GALENTINE MRN: [id number] Date of Birth: [birth year]  Transition [facility]) CM/SW Contact  Jeoffrey LITTIE Moose, [place] Phone Number: 06/03/2024, 3:18 PM  Clinical Narrative:    Pt auth approved for [facility], Auth PI#[id number]. CSW has confirmed bed availability, pt will be ready to d/c 7/24.   Expected Discharge Plan: Skilled Nursing Facility Barriers to Discharge: Continued Medical Work up               Expected Discharge Plan and Services       Living arrangements for the past 2 months: Single Family Home                                       Social Drivers of Health (SDOH) Interventions SDOH Screenings   Food Insecurity: Patient Unable To Answer (05/22/2024)  Housing: Unknown (05/22/2024)  Transportation Needs: Patient Unable To Answer (05/22/2024)  Utilities: Patient Unable To Answer (05/22/2024)  Social Connections: Patient Unable To Answer (05/22/2024)  Tobacco Use: Low Risk  (05/20/2024)    Readmission Risk Interventions     No data to display

## 2024-06-03 NOTE — Inpatient Diabetes Management (Signed)
 Inpatient Diabetes Program Recommendations  AACE/ADA: New Consensus Statement on Inpatient Glycemic Control (2015)  Target Ranges:  Prepandial:   less than 140 mg/dL      Peak postprandial:   less than 180 mg/dL (1-2 hours)      Critically ill patients:  140 - 180 mg/dL   Lab Results  Component Value Date   GLUCAP 215 (H) 06/03/2024   HGBA1C 7.6 (H) 05/21/2024    Review of Glycemic Control  Latest Reference Range & Units 06/02/24 08:34 06/02/24 11:55 06/02/24 16:49 06/02/24 20:29 06/03/24 00:16 06/03/24 05:07 06/03/24 08:56 06/03/24 12:51  Glucose-Capillary 70 - 99 mg/dL 834 (H) 697 (H) 817 (H) 371 (H) 257 (H) 93 175 (H) 215 (H)   Current orders for Inpatient glycemic control:  Semglee  10 units Daily Novolog  0-15 units Q4 hours  Nepro bid between meals  Inpatient Diabetes Program Recommendations:    Note: Diet ordered and glucose trends increase after PO intake. Renal function elevated  -   Change Novolog  scale to 0-9 units tid + hs -   Add Novolog  5 units tid meal coverage if eating >50% of meals  Thanks,  Clotilda Bull RN, MSN, BC-ADM Inpatient Diabetes Coordinator Team Pager [phone] (8a-5p)

## 2024-06-03 NOTE — TOC Progression Note (Signed)
 Transition of Care [facility]) - Progression Note    Patient Details  Name: Jonathan Baird MRN: [id number] Date of Birth: 12-Nov-1933  Transition of Care [facility]) CM/SW Contact  Mallary Kreger LITTIE Moose, LCSW Phone Number: 06/03/2024, 1:17 PM  Clinical Narrative:    CSW spoke with pt daughter, [name], over the phone to touch base on SNF choice. CSW informed [name] pt would be discharged in the morning, [name] stated she would lok over the list and let me know their choice later this afternoon.   Expected Discharge Plan: Skilled Nursing Facility Barriers to Discharge: Continued Medical Work up               Expected Discharge Plan and Services       Living arrangements for the past 2 months: Single Family Home                                       Social Drivers of Health (SDOH) Interventions SDOH Screenings   Food Insecurity: Patient Unable To Answer (05/22/2024)  Housing: Unknown (05/22/2024)  Transportation Needs: Patient Unable To Answer (05/22/2024)  Utilities: Patient Unable To Answer (05/22/2024)  Social Connections: Patient Unable To Answer (05/22/2024)  Tobacco Use: Low Risk  (05/20/2024)    Readmission Risk Interventions     No data to display

## 2024-06-03 NOTE — Plan of Care (Signed)
  Problem: Activity: Goal: Ability to tolerate increased activity will improve Outcome: Progressing   Problem: Respiratory: Goal: Ability to maintain a clear airway and adequate ventilation will improve Outcome: Progressing   Problem: Role Relationship: Goal: Method of communication will improve Outcome: Not Progressing   Problem: Education: Goal: Knowledge of General Education information will improve Description: Including pain rating scale, medication(s)/side effects and non-pharmacologic comfort measures Outcome: Progressing   Problem: Health Behavior/Discharge Planning: Goal: Ability to manage health-related needs will improve Outcome: Not Progressing   Problem: Clinical Measurements: Goal: Ability to maintain clinical measurements within normal limits will improve Outcome: Progressing Goal: Diagnostic test results will improve Outcome: Progressing Goal: Respiratory complications will improve Outcome: Progressing   Problem: Activity: Goal: Risk for activity intolerance will decrease Outcome: Progressing   Problem: Nutrition: Goal: Adequate nutrition will be maintained Outcome: Progressing   Problem: Elimination: Goal: Will not experience complications related to bowel motility Outcome: Progressing   Problem: Skin Integrity: Goal: Risk for impaired skin integrity will decrease Outcome: Progressing   Problem: Education: Goal: Knowledge of disease or condition will improve Outcome: Not Progressing Goal: Knowledge of secondary prevention will improve (MUST DOCUMENT ALL) Outcome: Not Progressing Goal: Knowledge of patient specific risk factors will improve (DELETE if not current risk factor) Outcome: Not Progressing   Problem: Ischemic Stroke/TIA Tissue Perfusion: Goal: Complications of ischemic stroke/TIA will be minimized Outcome: Progressing   Problem: Coping: Goal: Will verbalize positive feelings about self Outcome: Not Progressing Goal: Will identify  appropriate support needs Outcome: Not Progressing   Problem: Health Behavior/Discharge Planning: Goal: Ability to manage health-related needs will improve Outcome: Progressing Goal: Goals will be collaboratively established with patient/family Outcome: Progressing   Problem: Self-Care: Goal: Ability to participate in self-care as condition permits will improve Outcome: Not Progressing Goal: Verbalization of feelings and concerns over difficulty with self-care will improve Outcome: Not Progressing Goal: Ability to communicate needs accurately will improve Outcome: Not Progressing   Problem: Nutrition: Goal: Risk of aspiration will decrease Outcome: Progressing Goal: Dietary intake will improve Outcome: Progressing   Problem: Safety: Goal: Non-violent Restraint(s) Outcome: Completed/Met

## 2024-06-03 NOTE — Plan of Care (Signed)
  Problem: Activity: Goal: Ability to tolerate increased activity will improve 06/03/2024 0539 by Ray Richardson BROCKS, RN Outcome: Progressing 06/03/2024 0539 by Ray Richardson BROCKS, RN Outcome: Progressing   Problem: Respiratory: Goal: Ability to maintain a clear airway and adequate ventilation will improve 06/03/2024 0539 by Ray Richardson BROCKS, RN Outcome: Progressing 06/03/2024 0539 by Ray Richardson BROCKS, RN Outcome: Progressing   Problem: Role Relationship: Goal: Method of communication will improve Outcome: Progressing

## 2024-06-03 NOTE — Plan of Care (Signed)
  Problem: Activity: Goal: Ability to tolerate increased activity will improve Outcome: Progressing   Problem: Respiratory: Goal: Ability to maintain a clear airway and adequate ventilation will improve Outcome: Progressing   

## 2024-06-03 NOTE — Progress Notes (Signed)
 Calorie Count Note  Per RN, patient has bene eating and drinking well with 1:1. Pt has been guzzling liquids and RN has to remind pt to slow down however, no coughing noted. Pt is doing exceptionally well with his PO intake meeting over 100% of his estimated needs when assisted with feeding. Pt to be transferred to SNF once family elects which one.   48 hour calorie count ordered.   Diet: Dysphagia 1, Nectar thick liquids  Supplements: Magic cup, Nepro, Juven    Day 1: 7/21: Dinner: No meal ticket recorded  7/22:  Breakfast: 100% orange juice, pineapples, grits, scrambled eggs, waffle, milk (715 kcal, 26 gm protein) Lunch:100% Beef with brown gravy, mashed potatoes, 0% corn (265 kcal, 23 gm protein) Supplements: 1 Magic cup, 50% Nepro (500 kcal, 18.5 gm protein)   Total intake: 1,480 kcal (92.5% of minimum estimated needs)  67.5 gm protein (90% of minimum estimated needs)  Day 2: 7/22: Dinner: 100% pork with gravy, mashed potatoes, carrots, pears (515 kcal, 24 gm protein) 7/23:  Breakfast: 100% grits, milk, orange juice (305 kcal, 11 gm protein) Lunch: 100% chicken with gravy, mac n cheese, broccoli, pudding (620 kcal, 40 gm protein) Supplements: 50% Nepro, 2 Magic cups (790 kcal, 27.5 gm protein)   Total intake: 2,230 kcal ( 100% of minimum estimated needs)  102.5 gm protein (100% of minimum estimated needs)  Estimated Nutritional Needs:  Kcal:  1600-1800 kcal Protein:  75-90 gm Fluid:  1.6-1.8L   INTERVENTION:  Continue 1 packet Juven BID per PO, each packet provides 95 calories, 2.5 grams of protein (collagen) + micronutrients to support wound healing *Must be thickned to correct consistency  MVI with minerals daily po Nepro Shake po BID, each supplement provides 425 kcal and 19 grams protein Magic cup TID with meals, each supplement provides 290 kcal and 9 grams of protein Continue 48 hour calorie count    NUTRITION DIAGNOSIS:    Severe Malnutrition related to  chronic illness (dementia, CKD IV) as evidenced by severe muscle depletion, severe fat depletion. - Still applicable    GOAL:    Patient will meet greater than or equal to 90% of their needs - Meeting via PO   Tiffney Haughton, RD Registered Dietitian  See Amion for more information

## 2024-06-03 NOTE — Progress Notes (Signed)
 PROGRESS NOTE    Jonathan Baird  FMW:[id number] DOB: Aug 12, 1933 DOA: 05/20/2024 PCP: Verdia Lombard, MD    Brief Narrative:  89 year old gentleman with history of hypertension, hyperlipidemia, type 2 diabetes, CKD stage IV, peripheral vascular disease, right RCA stenosis, colon cancer status post hemicolectomy and dementia admitted with respiratory distress, small bowel obstruction, aspiration pneumonia and septic shock.  While waiting in the emergency room, he had a witnessed seizure lasting about 2 minutes with left eye deviation.  Neuro was consulted.  Patient was intubated on admitted to the ICU. Significant events: 7/9 - Presented to ED with AMS, SOB. 7/10 - PCCM consulted for airway protection, intubated in ED. 7/11-sedation stopped for WUA 7/13-remains off sedation 7/15-palliative care met with patient.  One-way extubation on 7/16. Transferred to medical floor.  Remains lethargic on tube feeding. Gradually improving now.  Able to eat a modified diet.  NG tube taken out.    Subjective:  Patient seen and examined.  Mostly alert and able to interact.  He does have some wet cough.  Nursing reported he ate 100% of his meals with assistance.  Assessment & Plan:   Acute metabolic encephalopathy in the context of underlying dementia and concern for seizures: Seen by neurology.  Initially loaded with Keppra  and given Ativan . Patient had continuous EEG that did not reveal any seizures. Currently not on any antiepileptics.  Seen by neurology. Patient also found to have acute left frontal lobe infarct.  Seen by stroke team.  Remains on aspirin .   Statins not needed, LDL low.  Acute hypoxemic respiratory failure and aspiration pneumonia: Extubated to room air.  Remains NPO.  Completed 5 days course of Zosyn  and doxycycline . Continue bronchodilator therapy and chest physiotherapy.  Seen by speech therapy.  Now on modified diet.  AKI with history of CKD stage IV: Creatinine remains  elevated.  Likely his new baseline.  Stable today.  Small bowel obstruction: Resolved without surgical intervention: Now tolerating oral intake.  Goal of care DNR/DNI Palliative care team following. Currently tolerating oral intake with modification of diet.  Dobbhoff tube taken out.  Family wants to allow time for outcome and wants to explore rehab options.  Stable to discharge to rehab.   DVT prophylaxis: heparin  injection 5,000 Units Start: 05/21/24 1400 SCDs Start: 05/21/24 0233   Code Status: DNR/DNI Family Communication: None today.  Palliative care team following. Disposition Plan: Status is: Inpatient Remains inpatient appropriate because: Waiting for SNF bed availability.  Medically stable.     Consultants:  General surgery Neurology Critical care Palliative care  Procedures:  Intubation  Antimicrobials:  Completed     Objective: Vitals:   06/02/24 2200 06/03/24 0500 06/03/24 0508 06/03/24 0854  BP: (!) 121/43  (!) 124/47 (!) 153/56  Pulse: 87  74 70  Resp:   17 16  Temp:   99.1 F (37.3 C) 97.6 F (36.4 C)  TempSrc:   Oral   SpO2:   100% 100%  Weight:  70.8 kg    Height:        Intake/Output Summary (Last 24 hours) at 06/03/2024 1159 Last data filed at 06/03/2024 0500 Gross per 24 hour  Intake 1200 ml  Output 1000 ml  Net 200 ml   Filed Weights   05/31/24 0600 06/01/24 0500 06/03/24 0500  Weight: 65.8 kg 64.3 kg 70.8 kg    Examination:  General: Very debilitated gentleman not in any distress. Cardiovascular: S1-S2 normal.  Regular rate rhythm.  On room air. Respiratory: Bilateral  clear.  On room air. Gastrointestinal: Soft and nontender.  Tolerating tube feeding. Ext: No edema.  No deformities. Neuro: Alert and awake.  Oriented to himself.  Responds appropriately.   Data Reviewed: I have personally reviewed following labs and imaging studies  CBC: Recent Labs  Lab 05/28/24 0524 05/29/24 0452  WBC 12.8* 12.4*  NEUTROABS 10.5*  --    HGB 8.1* 8.1*  HCT 24.5* 24.1*  MCV 78.3* 77.5*  PLT 228 242   Basic Metabolic Panel: Recent Labs  Lab 05/28/24 0524 05/29/24 0452 05/30/24 0402 05/31/24 0735 06/02/24 0610  NA 144 146* 145 147* 139  K 4.1 4.2 4.3 4.4 4.4  CL 111 112* 114* 113* 107  CO2 23 24 25 22 24   GLUCOSE 211* 132* 251* 201* 196*  BUN 70* 82* 89* 86* 73*  CREATININE 2.59* 2.64* 2.66* 2.63* 2.66*  CALCIUM  8.8* 8.8* 8.9 9.3 8.8*   GFR: Estimated Creatinine Clearance: 19.2 mL/min (A) (by C-G formula based on SCr of 2.66 mg/dL (H)). Liver Function Tests: No results for input(s): AST, ALT, ALKPHOS, BILITOT, PROT, ALBUMIN in the last 168 hours. No results for input(s): LIPASE, AMYLASE in the last 168 hours. No results for input(s): AMMONIA in the last 168 hours. Coagulation Profile: No results for input(s): INR, PROTIME in the last 168 hours. Cardiac Enzymes: No results for input(s): CKTOTAL, CKMB, CKMBINDEX, TROPONINI in the last 168 hours. BNP (last 3 results) No results for input(s): PROBNP in the last 8760 hours. HbA1C: No results for input(s): HGBA1C in the last 72 hours. CBG: Recent Labs  Lab 06/02/24 1649 06/02/24 2029 06/03/24 0016 06/03/24 0507 06/03/24 0856  GLUCAP 182* 371* 257* 93 175*   Lipid Profile: No results for input(s): CHOL, HDL, LDLCALC, TRIG, CHOLHDL, LDLDIRECT in the last 72 hours. Thyroid  Function Tests: No results for input(s): TSH, T4TOTAL, FREET4, T3FREE, THYROIDAB in the last 72 hours. Anemia Panel: No results for input(s): VITAMINB12, FOLATE, FERRITIN, TIBC, IRON, RETICCTPCT in the last 72 hours. Sepsis Labs: No results for input(s): PROCALCITON, LATICACIDVEN in the last 168 hours.   No results found for this or any previous visit (from the past 240 hours).        Radiology Studies: No results found.       Scheduled Meds:  amLODipine   10 mg Oral Daily   aspirin   81 mg Oral  Daily   Chlorhexidine  Gluconate Cloth  6 each Topical Daily   collagenase    Topical Daily   docusate  100 mg Per Tube BID   feeding supplement (NEPRO CARB STEADY)  237 mL Oral BID BM   heparin   5,000 Units Subcutaneous Q8H   insulin  aspart  0-15 Units Subcutaneous Q4H   insulin  glargine-yfgn  10 Units Subcutaneous Daily   labetalol   100 mg Oral BID   multivitamin with minerals  1 tablet Oral Daily   nutrition supplement (JUVEN)  1 packet Oral BID BM   mouth rinse  15 mL Mouth Rinse 4 times per day   [START ON 06/04/2024] pantoprazole   40 mg Oral Daily   polyethylene glycol  17 g Oral Daily   sodium chloride  flush  3 mL Intravenous Q12H   Continuous Infusions:     LOS: 13 days    Time spent: 40 minutes    Renato Applebaum, MD [facility]

## 2024-06-03 NOTE — TOC Progression Note (Signed)
 Transition of Care [facility]) - Progression Note    Patient Details  Name: Jonathan Baird MRN: [id number] Date of Birth: May 21, 1933  Transition of Care [facility]) CM/SW Contact  Diasia Henken LITTIE Moose, LCSW Phone Number: 06/03/2024, 2:34 PM  Clinical Narrative:    Pt daughter, [name], called CSW and said they have chosen [facility]. CSW will confirm bed availability and continue to follow.   Expected Discharge Plan: Skilled Nursing Facility Barriers to Discharge: Continued Medical Work up               Expected Discharge Plan and Services       Living arrangements for the past 2 months: Single Family Home                                       Social Drivers of Health (SDOH) Interventions SDOH Screenings   Food Insecurity: Patient Unable To Answer (05/22/2024)  Housing: Unknown (05/22/2024)  Transportation Needs: Patient Unable To Answer (05/22/2024)  Utilities: Patient Unable To Answer (05/22/2024)  Social Connections: Patient Unable To Answer (05/22/2024)  Tobacco Use: Low Risk  (05/20/2024)    Readmission Risk Interventions     No data to display

## 2024-06-04 DIAGNOSIS — K56609 Unspecified intestinal obstruction, unspecified as to partial versus complete obstruction: Secondary | ICD-10-CM | POA: Diagnosis not present

## 2024-06-04 LAB — GLUCOSE, CAPILLARY
Glucose-Capillary: 161 mg/dL — ABNORMAL HIGH (ref 70–99)
Glucose-Capillary: 169 mg/dL — ABNORMAL HIGH (ref 70–99)
Glucose-Capillary: 193 mg/dL — ABNORMAL HIGH (ref 70–99)
Glucose-Capillary: 240 mg/dL — ABNORMAL HIGH (ref 70–99)
Glucose-Capillary: 60 mg/dL — ABNORMAL LOW (ref 70–99)

## 2024-06-04 MED ORDER — LABETALOL HCL 100 MG PO TABS: 100.0000 mg | ORAL_TABLET | Freq: Two times a day (BID) | ORAL | Status: DC

## 2024-06-04 MED ORDER — ASPIRIN 81 MG PO CHEW
81.0000 mg | CHEWABLE_TABLET | Freq: Every day | ORAL | Status: DC
Start: 2024-06-05 — End: 2024-08-08

## 2024-06-04 MED ORDER — ADULT MULTIVITAMIN W/MINERALS CH: 1.0000 | ORAL_TABLET | Freq: Every day | ORAL | Status: DC

## 2024-06-04 MED ORDER — AMLODIPINE BESYLATE 10 MG PO TABS: 10.0000 mg | ORAL_TABLET | Freq: Every day | ORAL | Status: DC

## 2024-06-04 MED ORDER — POLYETHYLENE GLYCOL 3350 17 G PO PACK: 17.0000 g | PACK | Freq: Every day | ORAL | Status: DC

## 2024-06-04 MED ORDER — INSULIN GLARGINE-YFGN 100 UNIT/ML ~~LOC~~ SOLN: 10.0000 [IU] | Freq: Every day | SUBCUTANEOUS | Status: DC

## 2024-06-04 MED ORDER — PANTOPRAZOLE SODIUM 40 MG PO TBEC: 40.0000 mg | DELAYED_RELEASE_TABLET | Freq: Every day | ORAL | Status: DC

## 2024-06-04 MED ORDER — JUVEN PO PACK: 1.0000 | PACK | Freq: Two times a day (BID) | ORAL | Status: DC

## 2024-06-04 MED ORDER — DOCUSATE SODIUM 50 MG/5ML PO LIQD: 100.0000 mg | Freq: Two times a day (BID) | ORAL | Status: DC

## 2024-06-04 NOTE — TOC Transition Note (Signed)
 Transition of Care [facility]) - Discharge Note   Patient Details  Name: Jonathan Baird MRN: [id number] Date of Birth: Nov 19, 1932  Transition of Care [facility]) CM/SW Contact:  Jeoffrey LITTIE Moose, LCSW Phone Number: 06/04/2024, 3:14 PM   Clinical Narrative:    Patient will DC to: [facility]  Anticipated DC date: 06/04/24 Family notified: Yes Transport by: [facility]   Per MD patient ready for DC to [facility]. RN to call report prior to discharge [phone] Room 124B. RN, patient, patient's family, and facility notified of DC. Discharge Summary and FL2 sent to facility. DC packet on chart. Ambulance transport requested for patient.   CSW will sign off for now as social work intervention is no longer needed. Please consult us  again if new needs arise.     Final next level of care: Skilled Nursing Facility Barriers to Discharge: Barriers Resolved   Patient Goals and CMS Choice Patient states their goals for this hospitalization and ongoing recovery are:: TO go to SNF then return home CMS Medicare.gov Compare Post Acute Care list provided to:: Patient Represenative (must comment) Choice offered to / list presented to : Adult Children      Discharge Placement   Existing PASRR number confirmed : 06/04/24          Patient chooses bed at: [facility] Patient to be transferred to facility by: [facility] Name of family member notified: [name] Patient and family notified of of transfer: 06/04/24  Discharge Plan and Services Additional resources added to the After Visit Summary for                                       Social Drivers of Health (SDOH) Interventions SDOH Screenings   Food Insecurity: Patient Unable To Answer (05/22/2024)  Housing: Unknown (05/22/2024)  Transportation Needs: Patient Unable To Answer (05/22/2024)  Utilities: Patient Unable To Answer (05/22/2024)  Social Connections: Patient Unable To Answer (05/22/2024)  Tobacco Use: Low Risk   (05/20/2024)     Readmission Risk Interventions     No data to display

## 2024-06-04 NOTE — Progress Notes (Signed)
 Patient's IV has been removed per discharge orders. Tolerated well site is clean and dry. AVS instructions reviewed with receiving nurse at [facility], verbalized understanding, report was given.

## 2024-06-04 NOTE — Discharge Summary (Signed)
 Physician Discharge Summary  Jonathan Baird FMW:[id number] DOB: [birth year] DOA: 05/20/2024  PCP: Verdia Lombard, MD  Admit date: 05/20/2024 Discharge date: 06/04/2024  Admitted From: Home Disposition: Skilled nursing facility  Recommendations for Outpatient Follow-up:  Follow up with PCP in 1-2 weeks Please obtain BMP/CBC/magnesium /phosphorus in one week Discharging with Foley catheter.  Voiding trial in 1 week after he has more mobility. Consult palliative care to follow-up at the skilled nursing facility.  Discharge Condition: Fair CODE STATUS: DNR with limited intervention Diet recommendation: Dysphagia 1 diet, aspiration precautions, nectar thick liquids.  Discharge summary: 89 year old gentleman with history of hypertension, hyperlipidemia, type 2 diabetes, CKD stage IV, peripheral vascular disease, right RCA stenosis, colon cancer status post hemicolectomy and dementia admitted with respiratory distress, small bowel obstruction, aspiration pneumonia and septic shock.  While waiting in the emergency room, he had a witnessed seizure lasting about 2 minutes with left eye deviation.  Neuro was consulted.  Patient was intubated on admitted to the ICU.  Remained in the hospital initially intubated and sedated.  Patient remained in poor medical condition.  He also needed core track feeding tube due to his altered mentation.  Ultimately he started eating modified diet.  Family desired rehab.  Able to go to a SNF today.  Treated for following conditions.    Assessment & plan of care:   Acute metabolic encephalopathy in the context of underlying dementia and concern for seizures: Seen by neurology.  Initially loaded with Keppra  and given Ativan . Patient had continuous EEG that did not reveal any seizures. Decided not to continue seizure medications.  He was also found to have a small acute left frontal lobe infarct.  Remains on aspirin .  Symptomatic management.   Acute hypoxemic  respiratory failure and aspiration pneumonia: Intubated and extubated.  One-way extubation.  DNR with limited intervention.  No intubation in the future. Completed 5 days course of Zosyn  and doxycycline . Mental status improving.  On room air.  Now on modified diet.   AKI with history of CKD stage IV: Creatinine remains elevated.  Likely his new baseline.  Stable today.   Small bowel obstruction: Resolved without surgical intervention: Now tolerating oral intake.  Urinary retention: Mostly related to immobility and altered mentation.  Patient has a Foley catheter.  His wakefulness is improving.  Voiding trial in 1 week or before if he is more mobile.  Hypertension: Blood pressure stable on amlodipine  and labetalol .  Type 2 diabetes: Blood sugar stable on once a day long-acting insulin .  Continue.  Also keep on sliding scale insulin .   Goal of care Patient has remained in poor status but now is stabilizing.   He has started eating adequately with modified diet.  His long-term prognosis is poor.  Has also discussed about hospice options, however currently patient is eating and participating with therapies so family decided to give him a chance at rehab.   Transfer to SNF today.   If any further deterioration, he will benefit with palliation and hospice.  Consult palliative care team to continue follow-up.  Discharge Diagnoses:  Principal Problem:   Small bowel obstruction (HCC) Active Problems:   Acute hypoxemic respiratory failure (HCC)   Seizure-like activity (HCC)   Multifocal pneumonia   Acute respiratory failure with hypoxia (HCC)   Protein-calorie malnutrition, severe    Discharge Instructions  Discharge Instructions     Diet general   Complete by: As directed    Dysphagia 1 diet with nectar thick liquids All-time aspiration precautions.  Discharge instructions   Complete by: As directed    Continue Foley catheter on discharge.  Voiding trial in 1 week.   Discharge  wound care:   Complete by: As directed    Wound care  Daily      Comments: Cleanse sacrum with Vashe wound cleanser Soila [phone]), do not rinse and allow to air dry.  Apply 1/4 thick layer of Santyl  to wound bed, top with saline moist gauze, dry gauze and ABD pad and tape Wound care  Every other day    Comments: Cleanse L calf wound with Vashe, apply Xeroform gauze ([place]son [phone]) to wound every other day and secure with silicone foam  Wound care  Daily      Comments: Cleanse B heels with soap and water , dry and apply Xeroform gauze Soila [phone]) to wound beds daily, secure with silicone foam or Kerlix roll gauze whichever is preferred and place B feet in Prevalon boots to offload pressure   Increase activity slowly   Complete by: As directed       Allergies as of 06/04/2024   No Known Allergies      Medication List     STOP taking these medications    doxycycline  100 MG tablet Commonly known as: VIBRA -TABS   HumaLOG KwikPen 100 UNIT/ML KwikPen Generic drug: insulin  lispro   hydrALAZINE  50 MG tablet Commonly known as: APRESOLINE    insulin  glargine 100 UNIT/ML injection Commonly known as: LANTUS    Trulicity  1.5 MG/0.5ML Soaj Generic drug: Dulaglutide        TAKE these medications    amLODipine  10 MG tablet Commonly known as: NORVASC  Take 1 tablet (10 mg total) by mouth daily. Start taking on: June 05, 2024   aspirin  81 MG chewable tablet Chew 1 tablet (81 mg total) by mouth daily. Start taking on: June 05, 2024   docusate 50 MG/5ML liquid Commonly known as: COLACE Take 10 mLs (100 mg total) by mouth 2 (two) times daily.   fluticasone 50 MCG/ACT nasal spray Commonly known as: FLONASE Place 1 spray into both nostrils 2 (two) times daily.   Gvoke HypoPen 2-Pack 1 MG/0.2ML Soaj Generic drug: Glucagon Inject 1 mg into the skin once as needed (for hypoglycemia).   insulin  glargine-yfgn 100 UNIT/ML injection Commonly known as: SEMGLEE  Inject 0.1 mLs (10 Units  total) into the skin daily. Start taking on: June 05, 2024   labetalol  100 MG tablet Commonly known as: NORMODYNE  Take 1 tablet (100 mg total) by mouth 2 (two) times daily.   multivitamin with minerals Tabs tablet Take 1 tablet by mouth daily. Start taking on: June 05, 2024   nutrition supplement (JUVEN) Pack Take 1 packet by mouth 2 (two) times daily between meals.   pantoprazole  40 MG tablet Commonly known as: PROTONIX  Take 1 tablet (40 mg total) by mouth daily. Start taking on: June 05, 2024   polyethylene glycol 17 g packet Commonly known as: MIRALAX  / GLYCOLAX  Take 17 g by mouth daily. Start taking on: June 05, 2024               Discharge Care Instructions  (From admission, onward)           Start     Ordered   06/04/24 0000  Discharge wound care:       Comments: Wound care  Daily      Comments: Cleanse sacrum with Vashe wound cleanser Soila [phone]), do not rinse and allow to air dry.  Apply 1/4 thick layer  of Santyl  to wound bed, top with saline moist gauze, dry gauze and ABD pad and tape Wound care  Every other day    Comments: Cleanse L calf wound with Vashe, apply Xeroform gauze Soila [phone]) to wound every other day and secure with silicone foam  Wound care  Daily      Comments: Cleanse B heels with soap and water , dry and apply Xeroform gauze Soila [phone]) to wound beds daily, secure with silicone foam or Kerlix roll gauze whichever is preferred and place B feet in Prevalon boots to offload pressure   06/04/24 1106            Contact information for after-discharge care     Destination     [facility] .   Service: Skilled Nursing Contact information: [address] [place]  [address] [phone]                    No Known Allergies  Consultations: Critical care Neurology   Procedures/Studies: DG Abd Portable 1V Result Date: 05/29/2024 CLINICAL DATA:  Feeding tube placement EXAM: PORTABLE  ABDOMEN - 1 VIEW COMPARISON:  05/28/2024 FINDINGS: Removal of nasogastric tube. Feeding tube terminates in the gastric fundus. Contrast within the stomach. Similar gas-filled loops of large and small bowel. Example small bowel loop at 3.0 cm. No gross free intraperitoneal air. IMPRESSION: Feeding tube terminating at the gastric fundus. Similar borderline small bowel dilatation. Electronically Signed   By: Rockey Kilts M.D.   On: 05/29/2024 16:12   DG Swallowing Func-Speech Pathology Result Date: 05/29/2024 Table formatting from the original result was not included. Modified Barium Swallow Study Patient Details Name: Jonathan Baird MRN: [id number] Date of Birth: 1933-05-20 Today's Date: 05/29/2024 HPI/PMH: HPI: 89 year old man who presented to [facility] ED 7/10 for respiratory distress. While awaiting bed placement in ED, patient had a witnessed seizure lasting about 2 minutes with L eye deviation.  Patient remained particularly somnolent and was poorly protecting his airway with gurgling respirations and decision was made to intubate 7/9-7/16 (one way extubation). Pt additionally diagnosed with acute left frontal lobe CVA, SBO that resolved , and aspiration pna. CT on 7/9 (day of admission) showed Scattered airspace opacities are noted in the right lung with consolidation in the right upper and lower lobes. There is consolidation in the left lower lobe with mild airspace disease in  the left upper lobe.  And dilated, fluid filled esophagus. PMHx significant for HTN, HLD, T2DM, stage IV CKD, PVD, R ICA stenosis, colon CA s/p hemicolectomy, dementia. Clinical Impression: Clinical Impression: Very limited study due to pts poor tolerance of MBS chair. Pt could not hold head up and leaned forward despite assist and cues which contributed to aspiration. Severity of dysaphgia rated as a 3 on DIGEST scale due to gross aspiration of thin liquids, but no pharyngeal residue. Swallowing mechanism impaired by delayed initiation,  poor positioning and weak cough response, but otherwise movement of oropharyngeal musculature was very good.  Esophageal sweep after two sips of liquid and two bites of puree relatively unremarkable.  If pts generalized strength improved and pt were more interactive, suspect he may tolerate a wide rage of PO with adequate safety. Will need a repeat study to assess function after further recovery. Pt to continue NPO with ice chips for now. SLP will f/u for therapy. Factors that may increase risk of adverse event in presence of aspiration Noe & Lianne 2021): Factors that may increase risk of adverse event in  presence of aspiration Noe & Lianne 2021): Aspiration of thick, dense, and/or acidic materials; Frequent aspiration of large volumes; Weak cough; Dependence for feeding and/or oral hygiene; Limited mobility; Frail or deconditioned; Reduced cognitive function; Poor general health and/or compromised immunity Recommendations/Plan: Swallowing Evaluation Recommendations Swallowing Evaluation Recommendations Recommendations: NPO; Alternative means of nutrition - NG Tube Medication Administration: Via alternative means Postural changes: Stay upright 30-60 min after meals Caregiver Recommendations: Have oral suction available Treatment Plan Treatment Plan Treatment recommendations: Therapy as outlined in treatment plan below Follow-up recommendations: Skilled nursing-short term rehab (<3 hours/day) Treatment frequency: Min 2x/week Treatment duration: 2 weeks Recommendations Recommendations for follow up therapy are one component of a multi-disciplinary discharge planning process, led by the attending physician.  Recommendations may be updated based on patient status, additional functional criteria and insurance authorization. Assessment: Orofacial Exam: Orofacial Exam Oral Cavity - Dentition: Missing dentition Anatomy: Anatomy: WFL Boluses Administered: Boluses Administered Boluses Administered: Thin liquids  (Level 0); Puree  Oral Impairment Domain: Oral Impairment Domain Lip Closure: No labial escape Tongue control during bolus hold: Not tested Bolus transport/lingual motion: Slow tongue motion Oral residue: Complete oral clearance Initiation of pharyngeal swallow : Pyriform sinuses  Pharyngeal Impairment Domain: Pharyngeal Impairment Domain Soft palate elevation: No bolus between soft palate (SP)/pharyngeal wall (PW) Laryngeal elevation: Complete superior movement of thyroid  cartilage with complete approximation of arytenoids to epiglottic petiole Anterior hyoid excursion: Complete anterior movement Epiglottic movement: Complete inversion Laryngeal vestibule closure: Complete, no air/contrast in laryngeal vestibule Pharyngeal stripping wave : Present - complete Pharyngoesophageal segment opening: Complete distension and complete duration, no obstruction of flow Tongue base retraction: No contrast between tongue base and posterior pharyngeal wall (PPW) Pharyngeal residue: Complete pharyngeal clearance  Esophageal Impairment Domain: No data recorded Pill: No data recorded Penetration/Aspiration Scale Score: Penetration/Aspiration Scale Score 1.  Material does not enter airway: Puree 8.  Material enters airway, passes BELOW cords without attempt by patient to eject out (silent aspiration) : Thin liquids (Level 0) Compensatory Strategies: No data recorded  General Information: Caregiver present: No  Diet Prior to this Study: NPO; Large bore NG tube   No data recorded  Respiratory Status: WFL   Supplemental O2: None (Room air)   History of Recent Intubation: Yes  Behavior/Cognition: Requires cueing Self-Feeding Abilities: Dependent for feeding Baseline vocal quality/speech: Hypophonia/low volume Volitional Cough: Able to elicit Volitional Swallow: Able to elicit Exam Limitations: Poor positioning; Fatigue Goal Planning: Prognosis for improved oropharyngeal function: Fair No data recorded No data recorded No data recorded  No data recorded Pain: Pain Assessment Pain Assessment: Faces Faces Pain Scale: 0 End of Session: Start Time:SLP Start Time (ACUTE ONLY): 1100 Stop Time: SLP Stop Time (ACUTE ONLY): 1118 Time Calculation:SLP Time Calculation (min) (ACUTE ONLY): 18 min Charges: SLP Evaluations $ SLP Speech Visit: 1 Visit SLP Evaluations $BSS Swallow: 1 Procedure $MBS Swallow: 1 Procedure $Swallowing Treatment: 1 Procedure SLP visit diagnosis: SLP Visit Diagnosis: Dysphagia, oropharyngeal phase (R13.12) Past Medical History: Past Medical History: Diagnosis Date  Carotid artery stenosis   Right  CKD (chronic kidney disease), stage IV (HCC)   Colon cancer (HCC) 10/12/2004  stage III, s/p hemicolectomy  Dementia (HCC)   Diabetes mellitus   HLD (hyperlipidemia)   HTN (hypertension)   Peripheral vascular disease [facility])  Past Surgical History: Past Surgical History: Procedure Laterality Date  hemocolectomy  11/13/2003 [place], Consuelo Fitch 05/29/2024, 1:41 PM  DG Abd Portable 1V Result Date: 05/28/2024 EXAM: 1 VIEW XRAY OF THE ABDOMEN 05/28/2024 08:57:00 AM COMPARISON: 05/22/2024  CLINICAL HISTORY: Nasogastric tube present. FINDINGS: BOWEL: Gas-filled colon. There may be mild left abdominal small bowel dilatation at up to 3.0 cm. SOFT TISSUES: No free intraperitoneal air. BONES: No acute osseous abnormality. LINES AND TUBES: Nasogastric tube has been replaced or advanced. The tip is at the distal gastric antrum or the proximal duodenum. IMPRESSION: 1. Nasogastric tube terminates at the distal gastric antrum or the proximal duodenum. 2. Mild left abdominal small bowel dilatation up to 3.0 cm. Cannot exclude mild adynamic ileus or low-grade partial small bowel obstruction. Electronically signed by: Rockey Kilts MD 05/28/2024 12:45 PM EDT RP Workstation: [id number]   VAS US  CAROTID Result Date: 05/23/2024 Carotid Arterial Duplex Study Patient Name:  Jonathan Baird  Date of Exam:   05/22/2024 Medical Rec #: [id number]        Accession #:     [id number] Date of Birth: Oct 16, 1933       Patient Gender: M Patient Age:   37 years Exam Location:  [facility] Procedure:      VAS US  CAROTID Referring Phys: KHABIB DGAYLI --------------------------------------------------------------------------------  Indications:       CVA and Carotid artery disease. Risk Factors:      Hypertension, hyperlipidemia, Diabetes. Limitations        Today's exam was limited due to patient on a ventilator and                    Unable to turn head to scan right side. Comparison Study:  Prior study done 10/20/23 indicating right 40-59% ICA stenosis                    and 1-39% ICA stenosis Performing Technologist: Alberta Lis RVS  Examination Guidelines: A complete evaluation includes B-mode imaging, spectral Doppler, color Doppler, and power Doppler as needed of all accessible portions of each vessel. Bilateral testing is considered an integral part of a complete examination. Limited examinations for reoccurring indications may be performed as noted.  Right Carotid Findings: +----------+--------+--------+--------+------------------+--------+           PSV cm/sEDV cm/sStenosisPlaque DescriptionComments +----------+--------+--------+--------+------------------+--------+ CCA Prox  65      2               heterogenous               +----------+--------+--------+--------+------------------+--------+ CCA Distal86      6               heterogenous               +----------+--------+--------+--------+------------------+--------+ +----------+--------+-------+--------+-------------------+           PSV cm/sEDV cmsDescribeArm Pressure (mmHG) +----------+--------+-------+--------+-------------------+ [id number]                                         +----------+--------+-------+--------+-------------------+ +---------+--------+--+--------+-+ VertebralPSV cm/s82EDV cm/s2 +---------+--------+--+--------+-+  Left Carotid Findings:  +----------+--------+--------+--------+------------------+--------+           PSV cm/sEDV cm/sStenosisPlaque DescriptionComments +----------+--------+--------+--------+------------------+--------+ CCA Prox  111     11              heterogenous               +----------+--------+--------+--------+------------------+--------+ CCA Distal101     5               heterogenous               +----------+--------+--------+--------+------------------+--------+  ICA Prox  59      12      1-39%                              +----------+--------+--------+--------+------------------+--------+ ICA Mid   107     15                                         +----------+--------+--------+--------+------------------+--------+ ICA Distal114     8                                          +----------+--------+--------+--------+------------------+--------+ ECA       118     8               heterogenous               +----------+--------+--------+--------+------------------+--------+ +----------+--------+--------+--------+-------------------+           PSV cm/sEDV cm/sDescribeArm Pressure (mmHG) +----------+--------+--------+--------+-------------------+ [id number]                                          +----------+--------+--------+--------+-------------------+ +---------+--------+--+--------+-+ VertebralPSV cm/s53EDV cm/s1 +---------+--------+--+--------+-+   Summary: Right Carotid: Unable to visualize ICA/ECA secondary to ventilation. Vertebrals:  Bilateral vertebral arteries demonstrate antegrade flow. Subclavians: Normal flow hemodynamics were seen in bilateral subclavian              arteries. *See table(s) above for measurements and observations.  Electronically signed by Eather Popp MD on 05/23/2024 at 11:44:43 AM.    Final    Overnight EEG with video Result Date: 05/23/2024 Shelton Arlin KIDD, MD     05/23/2024 10:32 AM Patient Name: Jonathan Baird MRN: [id number]  Epilepsy Attending: Arlin KIDD Shelton Referring Physician/Provider: Jerri Pfeiffer, MD Duration: 05/22/2024 2052 to 05/23/2024 1018 Patient history: 89yo M with ams. EEG to evaluate for seizure Level of alertness: Awake, asleep AEDs during EEG study: None Technical aspects: This EEG study was done with scalp electrodes positioned according to the 10-20 International system of electrode placement. Electrical activity was reviewed with band pass filter of 1-70Hz , sensitivity of 7 uV/mm, display speed of 82mm/sec with a 60Hz  notched filter applied as appropriate. EEG data were recorded continuously and digitally stored.  Video monitoring was available and reviewed as appropriate. Description: EEG showed continuous generalized 3 to 6 Hz theta-delta slowing. Sleep was characterized by vertex waves, sleep spindles (12 to 14 Hz), maximal frontocentral region.  Generalized periodic discharges with triphasic morphology were noted at  1-1.5Hz , more prominent when awake/stimulated. Patient was noted to have episodes of brief upper extremity tremors/jerking intermittently like on 05/22/2024 2122 and 2214.  Concomitant EEG did not show any EEG changes to suggest seizure. Hyperventilation and photic stimulation were not performed.   ABNORMALITY - Periodic discharges with triphasic morphology, generalized ( GPDs) - Continuous slow, generalized IMPRESSION: This study showed generalized periodic discharges with triphasic morphology. This eeg pattern can be on the ictal-interictal continuum. However, the frequency, morphology and reactivity to stimulation is typically sen due to toxic- metabolic causes. Additionally there is moderate diffuse encephalopathy. Patient was noted to have episodes of brief upper extremity tremors/jerking intermittently like on 05/22/2024  2122 and 2214 without concomitant EEG change. These events were most likely not epileptic. Arlin MALVA Krebs   DG Abd Portable 1V Result Date: 05/22/2024 CLINICAL DATA:   Small-bowel obstruction EXAM: PORTABLE ABDOMEN - 1 VIEW COMPARISON:  Abdominal radiograph dated 05/21/2024 FINDINGS: Gastric/enteric tube tip projects over the stomach. Interval progression of radiopaque intraluminal contrast material to the level of the rectum. Distal transverse colon is mildly dilated. No free air or pneumatosis. No abnormal radio-opaque calculi or mass effect. No acute or substantial osseous abnormality. The sacrum and coccyx are partially obscured by overlying bowel contents. IMPRESSION: Interval progression of radiopaque intraluminal contrast material to the level of the rectum. Nonobstructive bowel gas pattern. Electronically Signed   By: Limin  Xu M.D.   On: 05/22/2024 12:07   ECHOCARDIOGRAM COMPLETE Result Date: 05/22/2024    ECHOCARDIOGRAM REPORT   Patient Name:   Jonathan Baird Date of Exam: 05/22/2024 Medical Rec #:  [id number]       Height:       70.0 in Accession #:    [id number]      Weight:       136.7 lb Date of Birth:  May 15, 1933      BSA:          1.776 m Patient Age:    89 years        BP:           146/58 mmHg Patient Gender: M               HR:           78 bpm. Exam Location:  Inpatient Procedure: 2D Echo, Cardiac Doppler and Color Doppler (Both Spectral and Color            Flow Doppler were utilized during procedure). Indications:    Stroke  History:        Patient has prior history of Echocardiogram examinations. Risk                 Factors:Hypertension and Diabetes.  Sonographer:    Christiana Mbomeh Referring Phys: [id number] KHABIB DGAYLI IMPRESSIONS  1. Left ventricular ejection fraction, by estimation, is 60 to 65%. The left ventricle has normal function. The left ventricle has no regional wall motion abnormalities. There is moderate asymmetric left ventricular hypertrophy of the basal-septal segment. Left ventricular diastolic parameters are consistent with Grade I diastolic dysfunction (impaired relaxation).  2. Right ventricular systolic function is normal. The  right ventricular size is normal. There is normal pulmonary artery systolic pressure. The estimated right ventricular systolic pressure is 28.6 mmHg.  3. The mitral valve is normal in structure. Trivial mitral valve regurgitation. No evidence of mitral stenosis.  4. The aortic valve was not well visualized. Aortic valve regurgitation is not visualized. Aortic valve sclerosis is present, with no evidence of aortic valve stenosis.  5. The inferior vena cava is dilated in size with >50% respiratory variability, suggesting right atrial pressure of 8 mmHg. FINDINGS  Left Ventricle: Left ventricular ejection fraction, by estimation, is 60 to 65%. The left ventricle has normal function. The left ventricle has no regional wall motion abnormalities. The left ventricular internal cavity size was normal in size. There is  moderate asymmetric left ventricular hypertrophy of the basal-septal segment. Left ventricular diastolic parameters are consistent with Grade I diastolic dysfunction (impaired relaxation). Right Ventricle: The right ventricular size is normal. Right vetricular wall thickness was not well visualized. Right ventricular systolic function is normal. There is normal  pulmonary artery systolic pressure. The tricuspid regurgitant velocity is 2.27 m/s, and with an assumed right atrial pressure of 8 mmHg, the estimated right ventricular systolic pressure is 28.6 mmHg. Left Atrium: Left atrial size was normal in size. Right Atrium: Right atrial size was normal in size. Pericardium: There is no evidence of pericardial effusion. Mitral Valve: The mitral valve is normal in structure. Trivial mitral valve regurgitation. No evidence of mitral valve stenosis. Tricuspid Valve: The tricuspid valve is normal in structure. Tricuspid valve regurgitation is trivial. Aortic Valve: The aortic valve was not well visualized. Aortic valve regurgitation is not visualized. Aortic valve sclerosis is present, with no evidence of aortic  valve stenosis. Aortic valve mean gradient measures 2.0 mmHg. Aortic valve peak gradient measures 3.3 mmHg. Aortic valve area, by VTI measures 4.00 cm. Pulmonic Valve: The pulmonic valve was not well visualized. Pulmonic valve regurgitation is not visualized. Aorta: The aortic root is normal in size and structure. Venous: The inferior vena cava is dilated in size with greater than 50% respiratory variability, suggesting right atrial pressure of 8 mmHg. IAS/Shunts: The interatrial septum was not well visualized.  LEFT VENTRICLE PLAX 2D LVIDd:         4.00 cm   Diastology LVIDs:         2.60 cm   LV e' medial:    3.59 cm/s LV PW:         1.10 cm   LV E/e' medial:  13.5 LV IVS:        1.70 cm   LV e' lateral:   3.70 cm/s LVOT diam:     2.10 cm   LV E/e' lateral: 13.1 LV SV:         75 LV SV Index:   42 LVOT Area:     3.46 cm  RIGHT VENTRICLE            IVC RV S prime:     8.59 cm/s  IVC diam: 2.40 cm TAPSE (M-mode): 1.8 cm LEFT ATRIUM             Index LA Vol (A2C):   22.1 ml 12.45 ml/m LA Vol (A4C):   15.0 ml 8.45 ml/m LA Biplane Vol: 19.2 ml 10.81 ml/m  AORTIC VALVE AV Area (Vmax):    3.58 cm AV Area (Vmean):   3.06 cm AV Area (VTI):     4.00 cm AV Vmax:           91.40 cm/s AV Vmean:          67.300 cm/s AV VTI:            0.188 m AV Peak Grad:      3.3 mmHg AV Mean Grad:      2.0 mmHg LVOT Vmax:         94.60 cm/s LVOT Vmean:        59.500 cm/s LVOT VTI:          0.217 m LVOT/AV VTI ratio: 1.15  AORTA Ao Root diam: 3.60 cm MITRAL VALVE               TRICUSPID VALVE MV Area (PHT): 2.46 cm    TV Peak grad:   20.6 mmHg MV Decel Time: 308 msec    TV Vmax:        2.27 m/s MV E velocity: 48.50 cm/s  TR Peak grad:   20.6 mmHg MV A velocity: 75.70 cm/s  TR Vmax:  227.00 cm/s MV E/A ratio:  0.64                            SHUNTS                            Systemic VTI:  0.22 m                            Systemic Diam: 2.10 cm Lonni Nanas MD Electronically signed by Lonni Nanas MD Signature  Date/Time: 05/22/2024/11:20:38 AM    Final    MR ANGIO HEAD WO CONTRAST Result Date: 05/22/2024 CLINICAL DATA:  Follow-up examination for stroke. EXAM: MRA HEAD WITHOUT CONTRAST TECHNIQUE: Angiographic images of the Circle of Willis were acquired using MRA technique without intravenous contrast. COMPARISON:  Prior MRI from 05/21/2024 FINDINGS: Anterior circulation: Both ICAs are patent through the siphons without significant stenosis or other abnormality. A1 segments patent bilaterally. Normal anterior communicating artery complex. Anterior cerebral arteries patent without significant stenosis. No M1 stenosis or occlusion. Distal MCA branches perfused and symmetric. Posterior circulation: Visualized V4 segments patent without stenosis. Right PICA patent at its origin. Left PICA not seen. Basilar patent without significant stenosis. Superior cerebral arteries patent bilaterally. Right PCA supplied via a robust right PCOM and hypoplastic right P1 segment. Fetal type origin of the left PCA. Both PCAs patent to their distal aspects without significant stenosis. Anatomic variants: None significant. Other: No intracranial aneurysm or other vascular malformation. Ventriculomegaly with a few scattered chronic infarcts, stable. IMPRESSION: Negative intracranial MRA. No large vessel occlusion, hemodynamically significant stenosis, or other acute vascular abnormality. No aneurysm. Electronically Signed   By: Morene Hoard M.D.   On: 05/22/2024 05:46   DG Abd Portable 1V-Small Bowel Obstruction Protocol-initial, 8 hr delay Result Date: 05/21/2024 CLINICAL DATA:  Small bowel obstruction. EXAM: PORTABLE ABDOMEN - 1 VIEW COMPARISON:  Radiograph dated 05/20/2024. FINDINGS: Enteric tube with tip in the proximal stomach. Injected contrast opacifies the stomach. No bowel dilatation. No free air. No acute osseous pathology. IMPRESSION: Enteric tube with tip in the proximal stomach. Electronically Signed   By: Vanetta Chou M.D.   On: 05/21/2024 17:45   MR Brain W and Wo Contrast Result Date: 05/21/2024 CLINICAL DATA:  Provided history: Neuro deficit, acute, stroke suspected. EXAM: MRI HEAD WITHOUT AND WITH CONTRAST TECHNIQUE: Multiplanar, multiecho pulse sequences of the brain and surrounding structures were obtained without and with intravenous contrast. CONTRAST:  7mL GADAVIST  GADOBUTROL  1 MMOL/ML IV SOLN COMPARISON:  Head CT 05/20/2024.  Brain MRI 12/31/2023. FINDINGS: Brain: Ventriculomegaly is unchanged from the prior brain MRI of 12/31/2023. This is at least partly due to cerebral atrophy. However, there is also acute callosal angle and crowding of the sulci at the level of the high frontoparietal lobes, and these findings can be seen in the setting of normal pressure hydrocephalus (NPH). 2 mm acute infarct within the left frontal lobe centrum semiovale (series 5, image 87). Multifocal T2 FLAIR hyperintense signal abnormality within the cerebral white matter and pons, nonspecific but compatible with moderate-to-advanced chronic small vessel ischemic disease. Unchanged small chronic infarcts within the thalami, pons and right cerebellar hemisphere. Chronic microhemorrhages scattered within the supratentorial brain, similar to the prior MRI. No evidence of an intracranial mass No extra-axial fluid collection. No midline shift. No pathologic intracranial enhancement identified. Vascular: Maintained flow voids within the proximal  large arterial vessels. Skull and upper cervical spine: No focal worrisome marrow lesion. Sinuses/Orbits: No mass or acute finding within the imaged orbits. Prior bilateral ocular lens replacement. No significant paranasal sinus disease. Other: Small-volume fluid within bilateral mastoid air cells. Impression #1 will be called to the ordering clinician or representative by the Radiologist Assistant, and communication documented in the PACS or [employer]. IMPRESSION: 1. 2 mm acute infarct  within the left frontal lobe centrum semiovale. 2. Background parenchymal atrophy, chronic small vessel ischemic disease and chronic infarcts, as described. 3. Ventriculomegaly is unchanged from the prior MRI of 12/31/2023. While this is at least partly due to cerebral atrophy, a component of normal pressure hydrocephalus is possible (in the appropriate clinical setting). 4. Small-volume fluid within bilateral mastoid air cells. Electronically Signed   By: Rockey Childs D.O.   On: 05/21/2024 13:32   EEG adult Result Date: 05/21/2024 Shelton Arlin KIDD, MD     05/21/2024  8:47 AM Patient Name: Jonathan Baird MRN: [id number] Epilepsy Attending: Arlin KIDD Shelton Referring Physician/Provider: Merrianne Locus, MD Date: 05/21/2024 Duration: 24.55 mins Patient history: 89yo M with ams. EEG to evaluate for seizure Level of alertness: comatose AEDs during EEG study: LEV, Propofol  Technical aspects: This EEG study was done with scalp electrodes positioned according to the 10-20 International system of electrode placement. Electrical activity was reviewed with band pass filter of 1-70Hz , sensitivity of 7 uV/mm, display speed of 64mm/sec with a 60Hz  notched filter applied as appropriate. EEG data were recorded continuously and digitally stored.  Video monitoring was available and reviewed as appropriate. Description: EEG showed burst attenuation pattern with bursts of 5 to 6 Hz theta slowing lasting 2 to 3 seconds alternating with 1 to 2 seconds of generalized EEG attenuation.  Hyperventilation and photic stimulation were not performed.   ABNORMALITY - Burst attenuation, generalized IMPRESSION: This study is suggestive of profound diffuse encephalopathy.  No seizures or epileptiform discharges were seen throughout the recording. Arlin KIDD Shelton   DG Chest Portable 1 View Result Date: 05/21/2024 CLINICAL DATA:  Intubation confirmation EXAM: PORTABLE CHEST 1 VIEW COMPARISON:  05/20/2024 FINDINGS: Endotracheal tube is been placed  with tip measuring 6.2 cm above the carina. An enteric tube was placed. Tip projects over the expected location of the EG junction with proximal side hole in the distal esophageal region. Advancement is suggested. Heart size and pulmonary vascularity are normal. Perihilar infiltrates similar to prior study. No pleural effusion or pneumothorax. Calcification of the aorta. IMPRESSION: Endotracheal tube with tip above the carina. Enteric tube tip projects over the lower esophageal region and advancement is suggested. Persistent perihilar infiltrates. Electronically Signed   By: Elsie Gravely M.D.   On: 05/21/2024 02:42   DG Abd Portable 1V-Small Bowel Protocol-Position Verification Result Date: 05/20/2024 CLINICAL DATA:  NG tube placement confirmation EXAM: PORTABLE ABDOMEN - 1 VIEW COMPARISON:  05/20/2024 FINDINGS: Limited field of view for tube placement verification purposes. The enteric tube has been advanced with tip now projecting over the left upper quadrant consistent with location in the body of the stomach. Mild gaseous distention of the stomach. IMPRESSION: Enteric tube has been advanced with tip now projecting over the left upper quadrant consistent with location in the body of the stomach. Electronically Signed   By: Elsie Gravely M.D.   On: 05/20/2024 23:00   DG Abd Portable 1 View Result Date: 05/20/2024 CLINICAL DATA:  NG tube placement EXAM: PORTABLE ABDOMEN - 1 VIEW COMPARISON:  CT 05/20/2024 FINDINGS: Enteric  tube tip folded back upon itself and directed cranially in the region of the distal esophagus. Heterogeneous airspace disease at the lung bases. Moderate air distension of the stomach IMPRESSION: Enteric tube tip folded back upon itself and directed cranially in the region of the distal esophagus. Recommend repositioning. Electronically Signed   By: Luke Bun M.D.   On: 05/20/2024 22:13   CT Head Wo Contrast Result Date: 05/20/2024 CLINICAL DATA:  Mental status changes, unknown  cause. EXAM: CT HEAD WITHOUT CONTRAST TECHNIQUE: Contiguous axial images were obtained from the base of the skull through the vertex without intravenous contrast. RADIATION DOSE REDUCTION: This exam was performed according to the departmental dose-optimization program which includes automated exposure control, adjustment of the mA and/or kV according to patient size and/or use of iterative reconstruction technique. COMPARISON:  Head CT 12/31/2023. FINDINGS: Brain: There is mild cerebellar volume loss, small chronic right superior cerebellar lacunar infarct. There is moderately advanced cerebral atrophy and atrophic ventriculomegaly with moderate to severe small vessel disease of cerebral white matter. Chronic dystrophic calcification again noted in the frontal falx, senescent mineralization in the basal ganglia. No acute cortical based infarct, hemorrhage, mass or mass effect is seen. There is no midline shift.  Basal cisterns are patent. Vascular: There are calcifications in the distal vertebral arteries and heavily in the siphons. No hyperdense central vessels. Skull: Negative for fractures or focal lesions. Sinuses/Orbits: No acute findings. Old lens replacements with otherwise negative orbits. Other: None. IMPRESSION: 1. No acute intracranial CT findings or interval changes. 2. Atrophy and small-vessel disease with atrophic ventriculomegaly. Stable exam. 3. Atherosclerosis. Electronically Signed   By: Francis Quam M.D.   On: 05/20/2024 21:49   CT CHEST ABDOMEN PELVIS WO CONTRAST Addendum Date: 05/20/2024 ADDENDUM REPORT: 05/20/2024 21:28 ADDENDUM: Critical Value/emergent results were called by telephone at the time of interpretation on 05/20/2024 at 9:27 pm to provider BERNARDINO FIREMAN , who verbally acknowledged these results. Electronically Signed   By: Leita Birmingham M.D.   On: 05/20/2024 21:28   Result Date: 05/20/2024 CLINICAL DATA:  Sepsis. EXAM: CT CHEST, ABDOMEN AND PELVIS WITHOUT CONTRAST TECHNIQUE:  Multidetector CT imaging of the chest, abdomen and pelvis was performed following the standard protocol without IV contrast. RADIATION DOSE REDUCTION: This exam was performed according to the departmental dose-optimization program which includes automated exposure control, adjustment of the mA and/or kV according to patient size and/or use of iterative reconstruction technique. COMPARISON:  09/17/2007. FINDINGS: CT CHEST FINDINGS Cardiovascular: The heart is normal in size and there is a trace pericardial effusion. Three-vessel coronary artery calcifications are noted. There is atherosclerotic calcification of the aorta without evidence of aneurysm. The pulmonary trunk is normal in caliber. Mediastinum/Nodes: No mediastinal or axillary lymphadenopathy. The thyroid  gland and trachea are within normal limits. Esophagus is mildly distended with fluid. Lungs/Pleura: Scattered airspace opacities are noted in the right lung with consolidation in the right upper and lower lobes. There is consolidation in the left lower lobe with mild airspace disease in the left upper lobe. There is a 3 mm nodule in the left upper lobe, axial image 38. No effusion or pneumothorax is seen. Musculoskeletal: Gynecomastia is noted. Degenerative changes are present in the thoracic spine. No acute osseous abnormality. CT ABDOMEN PELVIS FINDINGS Hepatobiliary: No focal liver abnormality is seen. No gallstones, gallbladder wall thickening, or biliary dilatation. Pancreas: There is a vague hypodensity in the head of the pancreas measuring 1.8 x 1.2 cm. No pancreatic ductal dilatation or surrounding inflammatory changes. Spleen:  Normal in size without focal abnormality. Adrenals/Urinary Tract: The adrenal glands are within normal limits. No renal calculus or hydronephrosis bilaterally. A Foley catheter is present in the urinary bladder. Stomach/Bowel: The stomach is distended with fluid. Multiple dilated loops of small bowel are present in the  abdomen measuring up to 4.4 cm in diameter. A suspected transition point is seen in the mid pelvis, sagittal image 71, coronal image 74. There is a left inguinal hernia containing nonobstructed small bowel. No free air or pneumatosis is seen. A moderate amount of retained stool is noted in the rectum. Surgical changes are present at the cecum and the appendix is not seen. Vascular/Lymphatic: Aortic atherosclerosis. No enlarged abdominal or pelvic lymph nodes. Reproductive: The prostate gland is enlarged. Other: No abdominopelvic ascites. There is a left inguinal hernia containing nonobstructed small bowel. Musculoskeletal: Degenerative changes are present in the lumbar spine. No acute osseous abnormality is seen. IMPRESSION: 1. Multiple dilated loops of small bowel in the abdomen with a suspected transition point in the mid pelvis, concerning for small bowel obstruction. Surgical consultation is recommended. 2. Scattered airspace disease in the lungs bilaterally with consolidation in the right upper and lower lobes and left lower lobe, suggesting pneumonia. 3. Vague hypodensity in the head of the pancreas measuring 1.8 cm. Nonemergent MRI is recommended for further characterization. 4. Multi-vessel coronary artery calcifications. 5. Aortic atherosclerosis. Electronically Signed: By: Leita Birmingham M.D. On: 05/20/2024 21:15   DG Chest Port 1 View Result Date: 05/20/2024 CLINICAL DATA:  Possible sepsis.  Respiratory distress. EXAM: PORTABLE CHEST 1 VIEW COMPARISON:  12/31/2023. FINDINGS: The heart size and mediastinal contours are within normal limits. There is atherosclerotic calcification of the aorta. Scattered airspace disease is noted in the perihilar region on the right and at the left lung base. No effusion or pneumothorax is seen. No acute osseous abnormality. IMPRESSION: Scattered airspace disease in the perihilar region on the right and at the left lung base, concerning for pneumonia. Electronically Signed    By: Leita Birmingham M.D.   On: 05/20/2024 19:58   (Echo, Carotid, EGD, Colonoscopy, ERCP)    Subjective: Patient seen and examined.  He tells me he is fine.  Keeps eyes closed.  On room air.  Nursing staff reported that he ate 100% of the breakfast with assistance.   Discharge Exam: Vitals:   06/04/24 0805 06/04/24 1017  BP: (!) 157/61 (!) 157/61  Pulse: 63 70  Resp: 17   Temp: 98.5 F (36.9 C)   SpO2: 100%    Vitals:   06/04/24 0415 06/04/24 0419 06/04/24 0805 06/04/24 1017  BP:  (!) 154/57 (!) 157/61 (!) 157/61  Pulse:  70 63 70  Resp:  17 17   Temp:  98.2 F (36.8 C) 98.5 F (36.9 C)   TempSrc:  Oral Oral   SpO2:  100% 100%   Weight: 68 kg     Height:        General: Pt is alert, awake, not in acute distress Chronically sick looking.  Very frail and debilitated.  Not in any distress. Cardiovascular: RRR, S1/S2 +, no rubs, no gallops Respiratory: CTA bilaterally, no wheezing, no rhonchi, some conducted upper airway sounds. Abdominal: Soft, NT, ND, bowel sounds + Extremities: no edema, no cyanosis,  Foley catheter with clear urine. Patient is alert and awake.  He is oriented to himself.  Gross generalized weakness.   The results of significant diagnostics from this hospitalization (including imaging, microbiology, ancillary and laboratory) are listed  below for reference.     Microbiology: No results found for this or any previous visit (from the past 240 hours).   Labs: BNP (last 3 results) Recent Labs    05/20/24 1957  BNP 22.1   Basic Metabolic Panel: Recent Labs  Lab 05/29/24 0452 05/30/24 0402 05/31/24 0735 06/02/24 0610  NA 146* 145 147* 139  K 4.2 4.3 4.4 4.4  CL 112* 114* 113* 107  CO2 24 25 22 24   GLUCOSE 132* 251* 201* 196*  BUN 82* 89* 86* 73*  CREATININE 2.64* 2.66* 2.63* 2.66*  CALCIUM  8.8* 8.9 9.3 8.8*   Liver Function Tests: No results for input(s): AST, ALT, ALKPHOS, BILITOT, PROT, ALBUMIN in the last 168 hours. No  results for input(s): LIPASE, AMYLASE in the last 168 hours. No results for input(s): AMMONIA in the last 168 hours. CBC: Recent Labs  Lab 05/29/24 0452  WBC 12.4*  HGB 8.1*  HCT 24.1*  MCV 77.5*  PLT 242   Cardiac Enzymes: No results for input(s): CKTOTAL, CKMB, CKMBINDEX, TROPONINI in the last 168 hours. BNP: Invalid input(s): POCBNP CBG: Recent Labs  Lab 06/03/24 2038 06/04/24 0007 06/04/24 0406 06/04/24 0506 06/04/24 0802  GLUCAP 290* 161* 60* 169* 193*   D-Dimer No results for input(s): DDIMER in the last 72 hours. Hgb A1c No results for input(s): HGBA1C in the last 72 hours. Lipid Profile No results for input(s): CHOL, HDL, LDLCALC, TRIG, CHOLHDL, LDLDIRECT in the last 72 hours. Thyroid  function studies No results for input(s): TSH, T4TOTAL, T3FREE, THYROIDAB in the last 72 hours.  Invalid input(s): FREET3 Anemia work up No results for input(s): VITAMINB12, FOLATE, FERRITIN, TIBC, IRON, RETICCTPCT in the last 72 hours. Urinalysis    Component Value Date/Time   COLORURINE YELLOW 05/20/2024 1800   APPEARANCEUR CLEAR 05/20/2024 1800   LABSPEC 1.009 05/20/2024 1800   PHURINE 5.0 05/20/2024 1800   GLUCOSEU >=500 (A) 05/20/2024 1800   HGBUR NEGATIVE 05/20/2024 1800   BILIRUBINUR NEGATIVE 05/20/2024 1800   KETONESUR NEGATIVE 05/20/2024 1800   PROTEINUR 100 (A) 05/20/2024 1800   NITRITE NEGATIVE 05/20/2024 1800   LEUKOCYTESUR NEGATIVE 05/20/2024 1800   Sepsis Labs Recent Labs  Lab 05/29/24 0452  WBC 12.4*   Microbiology No results found for this or any previous visit (from the past 240 hours).   Time coordinating discharge: 35 minutes  SIGNED:   Renato Applebaum, MD  [facility] 06/04/2024, 11:06 AM

## 2024-06-04 NOTE — Progress Notes (Signed)
 AVS completed for discharge packet and placed with chart.

## 2024-06-04 NOTE — Plan of Care (Signed)
  Problem: Activity: Goal: Ability to tolerate increased activity will improve Outcome: Progressing   Problem: Respiratory: Goal: Ability to maintain a clear airway and adequate ventilation will improve Outcome: Progressing   Problem: Role Relationship: Goal: Method of communication will improve Outcome: Progressing   Problem: Education: Goal: Knowledge of General Education information will improve Description: Including pain rating scale, medication(s)/side effects and non-pharmacologic comfort measures Outcome: Progressing   Problem: Health Behavior/Discharge Planning: Goal: Ability to manage health-related needs will improve Outcome: Progressing   Problem: Clinical Measurements: Goal: Ability to maintain clinical measurements within normal limits will improve Outcome: Progressing Goal: Diagnostic test results will improve Outcome: Progressing Goal: Respiratory complications will improve Outcome: Progressing   Problem: Activity: Goal: Risk for activity intolerance will decrease Outcome: Progressing   Problem: Nutrition: Goal: Adequate nutrition will be maintained Outcome: Progressing   Problem: Elimination: Goal: Will not experience complications related to bowel motility Outcome: Progressing   Problem: Skin Integrity: Goal: Risk for impaired skin integrity will decrease Outcome: Progressing   Problem: Education: Goal: Knowledge of disease or condition will improve Outcome: Progressing Goal: Knowledge of secondary prevention will improve (MUST DOCUMENT ALL) Outcome: Progressing Goal: Knowledge of patient specific risk factors will improve (DELETE if not current risk factor) Outcome: Progressing   Problem: Ischemic Stroke/TIA Tissue Perfusion: Goal: Complications of ischemic stroke/TIA will be minimized Outcome: Progressing   Problem: Coping: Goal: Will verbalize positive feelings about self Outcome: Progressing Goal: Will identify appropriate support  needs Outcome: Progressing   Problem: Health Behavior/Discharge Planning: Goal: Ability to manage health-related needs will improve Outcome: Progressing Goal: Goals will be collaboratively established with patient/family Outcome: Progressing   Problem: Self-Care: Goal: Ability to participate in self-care as condition permits will improve Outcome: Progressing Goal: Verbalization of feelings and concerns over difficulty with self-care will improve Outcome: Progressing Goal: Ability to communicate needs accurately will improve Outcome: Progressing   Problem: Nutrition: Goal: Risk of aspiration will decrease Outcome: Progressing Goal: Dietary intake will improve Outcome: Progressing

## 2024-07-07 ENCOUNTER — Ambulatory Visit (INDEPENDENT_AMBULATORY_CARE_PROVIDER_SITE_OTHER): Admitting: Podiatry

## 2024-07-07 DIAGNOSIS — Z91199 Patient's noncompliance with other medical treatment and regimen due to unspecified reason: Secondary | ICD-10-CM

## 2024-07-07 NOTE — Progress Notes (Signed)
 1. No-show for appointment

## 2024-07-30 ENCOUNTER — Emergency Department (HOSPITAL_COMMUNITY)

## 2024-07-30 ENCOUNTER — Encounter (HOSPITAL_COMMUNITY): Payer: Self-pay | Admitting: Emergency Medicine

## 2024-07-30 ENCOUNTER — Inpatient Hospital Stay (HOSPITAL_COMMUNITY)
Admission: EM | Admit: 2024-07-30 | Discharge: 2024-08-12 | DRG: 871 | Disposition: E | Attending: Internal Medicine | Admitting: Internal Medicine

## 2024-07-30 DIAGNOSIS — I462 Cardiac arrest due to underlying cardiac condition: Secondary | ICD-10-CM | POA: Diagnosis present

## 2024-07-30 DIAGNOSIS — N170 Acute kidney failure with tubular necrosis: Secondary | ICD-10-CM | POA: Diagnosis not present

## 2024-07-30 DIAGNOSIS — D638 Anemia in other chronic diseases classified elsewhere: Secondary | ICD-10-CM | POA: Diagnosis present

## 2024-07-30 DIAGNOSIS — J9601 Acute respiratory failure with hypoxia: Secondary | ICD-10-CM | POA: Diagnosis present

## 2024-07-30 DIAGNOSIS — L89323 Pressure ulcer of left buttock, stage 3: Secondary | ICD-10-CM | POA: Diagnosis present

## 2024-07-30 DIAGNOSIS — E785 Hyperlipidemia, unspecified: Secondary | ICD-10-CM | POA: Diagnosis present

## 2024-07-30 DIAGNOSIS — E1151 Type 2 diabetes mellitus with diabetic peripheral angiopathy without gangrene: Secondary | ICD-10-CM | POA: Diagnosis present

## 2024-07-30 DIAGNOSIS — L89626 Pressure-induced deep tissue damage of left heel: Secondary | ICD-10-CM | POA: Diagnosis present

## 2024-07-30 DIAGNOSIS — N184 Chronic kidney disease, stage 4 (severe): Secondary | ICD-10-CM | POA: Diagnosis present

## 2024-07-30 DIAGNOSIS — R627 Adult failure to thrive: Secondary | ICD-10-CM | POA: Diagnosis present

## 2024-07-30 DIAGNOSIS — N390 Urinary tract infection, site not specified: Secondary | ICD-10-CM | POA: Diagnosis present

## 2024-07-30 DIAGNOSIS — L89216 Pressure-induced deep tissue damage of right hip: Secondary | ICD-10-CM | POA: Diagnosis present

## 2024-07-30 DIAGNOSIS — R64 Cachexia: Secondary | ICD-10-CM | POA: Diagnosis present

## 2024-07-30 DIAGNOSIS — Z7189 Other specified counseling: Secondary | ICD-10-CM | POA: Diagnosis not present

## 2024-07-30 DIAGNOSIS — R1310 Dysphagia, unspecified: Secondary | ICD-10-CM | POA: Diagnosis present

## 2024-07-30 DIAGNOSIS — Z833 Family history of diabetes mellitus: Secondary | ICD-10-CM

## 2024-07-30 DIAGNOSIS — I472 Ventricular tachycardia, unspecified: Secondary | ICD-10-CM | POA: Diagnosis present

## 2024-07-30 DIAGNOSIS — N179 Acute kidney failure, unspecified: Secondary | ICD-10-CM | POA: Diagnosis present

## 2024-07-30 DIAGNOSIS — L89159 Pressure ulcer of sacral region, unspecified stage: Secondary | ICD-10-CM | POA: Diagnosis not present

## 2024-07-30 DIAGNOSIS — A419 Sepsis, unspecified organism: Secondary | ICD-10-CM | POA: Diagnosis present

## 2024-07-30 DIAGNOSIS — L89313 Pressure ulcer of right buttock, stage 3: Secondary | ICD-10-CM | POA: Diagnosis present

## 2024-07-30 DIAGNOSIS — I6529 Occlusion and stenosis of unspecified carotid artery: Secondary | ICD-10-CM | POA: Diagnosis present

## 2024-07-30 DIAGNOSIS — E1122 Type 2 diabetes mellitus with diabetic chronic kidney disease: Secondary | ICD-10-CM | POA: Diagnosis present

## 2024-07-30 DIAGNOSIS — R6521 Severe sepsis with septic shock: Secondary | ICD-10-CM | POA: Diagnosis present

## 2024-07-30 DIAGNOSIS — A4159 Other Gram-negative sepsis: Secondary | ICD-10-CM | POA: Diagnosis present

## 2024-07-30 DIAGNOSIS — Z85038 Personal history of other malignant neoplasm of large intestine: Secondary | ICD-10-CM

## 2024-07-30 DIAGNOSIS — J9600 Acute respiratory failure, unspecified whether with hypoxia or hypercapnia: Secondary | ICD-10-CM | POA: Diagnosis not present

## 2024-07-30 DIAGNOSIS — Z66 Do not resuscitate: Secondary | ICD-10-CM | POA: Diagnosis present

## 2024-07-30 DIAGNOSIS — L89616 Pressure-induced deep tissue damage of right heel: Secondary | ICD-10-CM | POA: Diagnosis present

## 2024-07-30 DIAGNOSIS — E611 Iron deficiency: Secondary | ICD-10-CM | POA: Diagnosis present

## 2024-07-30 DIAGNOSIS — E43 Unspecified severe protein-calorie malnutrition: Secondary | ICD-10-CM | POA: Diagnosis present

## 2024-07-30 DIAGNOSIS — B964 Proteus (mirabilis) (morganii) as the cause of diseases classified elsewhere: Secondary | ICD-10-CM | POA: Diagnosis present

## 2024-07-30 DIAGNOSIS — Z7901 Long term (current) use of anticoagulants: Secondary | ICD-10-CM

## 2024-07-30 DIAGNOSIS — Z789 Other specified health status: Secondary | ICD-10-CM | POA: Diagnosis not present

## 2024-07-30 DIAGNOSIS — R6520 Severe sepsis without septic shock: Secondary | ICD-10-CM | POA: Diagnosis not present

## 2024-07-30 DIAGNOSIS — I2510 Atherosclerotic heart disease of native coronary artery without angina pectoris: Secondary | ICD-10-CM | POA: Diagnosis present

## 2024-07-30 DIAGNOSIS — Z515 Encounter for palliative care: Secondary | ICD-10-CM | POA: Diagnosis not present

## 2024-07-30 DIAGNOSIS — R4182 Altered mental status, unspecified: Secondary | ICD-10-CM | POA: Diagnosis not present

## 2024-07-30 DIAGNOSIS — E875 Hyperkalemia: Secondary | ICD-10-CM | POA: Diagnosis present

## 2024-07-30 DIAGNOSIS — E872 Acidosis, unspecified: Secondary | ICD-10-CM | POA: Diagnosis present

## 2024-07-30 DIAGNOSIS — F0390 Unspecified dementia without behavioral disturbance: Secondary | ICD-10-CM | POA: Diagnosis present

## 2024-07-30 DIAGNOSIS — Z7401 Bed confinement status: Secondary | ICD-10-CM

## 2024-07-30 DIAGNOSIS — I129 Hypertensive chronic kidney disease with stage 1 through stage 4 chronic kidney disease, or unspecified chronic kidney disease: Secondary | ICD-10-CM | POA: Diagnosis present

## 2024-07-30 DIAGNOSIS — R4589 Other symptoms and signs involving emotional state: Secondary | ICD-10-CM | POA: Diagnosis not present

## 2024-07-30 DIAGNOSIS — G9340 Encephalopathy, unspecified: Secondary | ICD-10-CM | POA: Diagnosis present

## 2024-07-30 DIAGNOSIS — Z7982 Long term (current) use of aspirin: Secondary | ICD-10-CM

## 2024-07-30 DIAGNOSIS — Z681 Body mass index (BMI) 19 or less, adult: Secondary | ICD-10-CM | POA: Diagnosis not present

## 2024-07-30 DIAGNOSIS — R569 Unspecified convulsions: Secondary | ICD-10-CM | POA: Diagnosis present

## 2024-07-30 DIAGNOSIS — Z9049 Acquired absence of other specified parts of digestive tract: Secondary | ICD-10-CM

## 2024-07-30 DIAGNOSIS — D631 Anemia in chronic kidney disease: Secondary | ICD-10-CM | POA: Diagnosis not present

## 2024-07-30 DIAGNOSIS — E119 Type 2 diabetes mellitus without complications: Secondary | ICD-10-CM | POA: Diagnosis not present

## 2024-07-30 DIAGNOSIS — I469 Cardiac arrest, cause unspecified: Secondary | ICD-10-CM | POA: Diagnosis not present

## 2024-07-30 DIAGNOSIS — J690 Pneumonitis due to inhalation of food and vomit: Secondary | ICD-10-CM | POA: Diagnosis present

## 2024-07-30 DIAGNOSIS — Z794 Long term (current) use of insulin: Secondary | ICD-10-CM

## 2024-07-30 LAB — I-STAT VENOUS BLOOD GAS, ED
Acid-base deficit: 13 mmol/L — ABNORMAL HIGH (ref 0.0–2.0)
Bicarbonate: 13.5 mmol/L — ABNORMAL LOW (ref 20.0–28.0)
Calcium, Ion: 1.15 mmol/L (ref 1.15–1.40)
HCT: 31 % — ABNORMAL LOW (ref 39.0–52.0)
Hemoglobin: 10.5 g/dL — ABNORMAL LOW (ref 13.0–17.0)
O2 Saturation: 32 %
Potassium: 4.9 mmol/L (ref 3.5–5.1)
Sodium: 137 mmol/L (ref 135–145)
TCO2: 15 mmol/L — ABNORMAL LOW (ref 22–32)
pCO2, Ven: 34.1 mmHg — ABNORMAL LOW (ref 44–60)
pH, Ven: 7.206 — ABNORMAL LOW (ref 7.25–7.43)
pO2, Ven: 24 mmHg — CL (ref 32–45)

## 2024-07-30 LAB — TSH: TSH: 3.691 u[IU]/mL (ref 0.350–4.500)

## 2024-07-30 LAB — URINALYSIS, W/ REFLEX TO CULTURE (INFECTION SUSPECTED)
Bilirubin Urine: NEGATIVE
Glucose, UA: NEGATIVE mg/dL
Ketones, ur: NEGATIVE mg/dL
Nitrite: NEGATIVE
Protein, ur: 100 mg/dL — AB
Specific Gravity, Urine: 1.015 (ref 1.005–1.030)
pH: 6 (ref 5.0–8.0)

## 2024-07-30 LAB — I-STAT ARTERIAL BLOOD GAS, ED
Acid-base deficit: 15 mmol/L — ABNORMAL HIGH (ref 0.0–2.0)
Bicarbonate: 12.5 mmol/L — ABNORMAL LOW (ref 20.0–28.0)
Calcium, Ion: 1.2 mmol/L (ref 1.15–1.40)
HCT: 24 % — ABNORMAL LOW (ref 39.0–52.0)
Hemoglobin: 8.2 g/dL — ABNORMAL LOW (ref 13.0–17.0)
O2 Saturation: 100 %
Patient temperature: 96.8
Potassium: 4.4 mmol/L (ref 3.5–5.1)
Sodium: 137 mmol/L (ref 135–145)
TCO2: 14 mmol/L — ABNORMAL LOW (ref 22–32)
pCO2 arterial: 32 mmHg (ref 32–48)
pH, Arterial: 7.194 — CL (ref 7.35–7.45)
pO2, Arterial: 372 mmHg — ABNORMAL HIGH (ref 83–108)

## 2024-07-30 LAB — MRSA NEXT GEN BY PCR, NASAL: MRSA by PCR Next Gen: NOT DETECTED

## 2024-07-30 LAB — COMPREHENSIVE METABOLIC PANEL WITH GFR
ALT: 40 U/L (ref 0–44)
AST: 51 U/L — ABNORMAL HIGH (ref 15–41)
Albumin: 1.8 g/dL — ABNORMAL LOW (ref 3.5–5.0)
Alkaline Phosphatase: 106 U/L (ref 38–126)
Anion gap: 22 — ABNORMAL HIGH (ref 5–15)
BUN: 97 mg/dL — ABNORMAL HIGH (ref 8–23)
CO2: 13 mmol/L — ABNORMAL LOW (ref 22–32)
Calcium: 9.2 mg/dL (ref 8.9–10.3)
Chloride: 102 mmol/L (ref 98–111)
Creatinine, Ser: 4.63 mg/dL — ABNORMAL HIGH (ref 0.61–1.24)
GFR, Estimated: 12 mL/min — ABNORMAL LOW (ref >60)
Glucose, Bld: 397 mg/dL — ABNORMAL HIGH (ref 70–99)
Potassium: 5 mmol/L (ref 3.5–5.1)
Sodium: 137 mmol/L (ref 135–145)
Total Bilirubin: 0.8 mg/dL (ref 0.0–1.2)
Total Protein: 7.8 g/dL (ref 6.5–8.1)

## 2024-07-30 LAB — CBC WITH DIFFERENTIAL/PLATELET
Abs Immature Granulocytes: 0.73 K/uL — ABNORMAL HIGH (ref 0.00–0.07)
Basophils Absolute: 0.1 K/uL (ref 0.0–0.1)
Basophils Relative: 0 %
Eosinophils Absolute: 0 K/uL (ref 0.0–0.5)
Eosinophils Relative: 0 %
HCT: 29.5 % — ABNORMAL LOW (ref 39.0–52.0)
Hemoglobin: 9.5 g/dL — ABNORMAL LOW (ref 13.0–17.0)
Immature Granulocytes: 2 %
Lymphocytes Relative: 4 %
Lymphs Abs: 1.6 K/uL (ref 0.7–4.0)
MCH: 25.5 pg — ABNORMAL LOW (ref 26.0–34.0)
MCHC: 32.2 g/dL (ref 30.0–36.0)
MCV: 79.1 fL — ABNORMAL LOW (ref 80.0–100.0)
Monocytes Absolute: 1.1 K/uL — ABNORMAL HIGH (ref 0.1–1.0)
Monocytes Relative: 3 %
Neutro Abs: 34.9 K/uL — ABNORMAL HIGH (ref 1.7–7.7)
Neutrophils Relative %: 91 %
Platelets: 417 K/uL — ABNORMAL HIGH (ref 150–400)
RBC: 3.73 MIL/uL — ABNORMAL LOW (ref 4.22–5.81)
RDW: 16.1 % — ABNORMAL HIGH (ref 11.5–15.5)
Smear Review: NORMAL
WBC: 38.4 K/uL — ABNORMAL HIGH (ref 4.0–10.5)
nRBC: 0 % (ref 0.0–0.2)

## 2024-07-30 LAB — TROPONIN I (HIGH SENSITIVITY)
Troponin I (High Sensitivity): 51 ng/L — ABNORMAL HIGH (ref <18)
Troponin I (High Sensitivity): 46 ng/L — ABNORMAL HIGH (ref <18)

## 2024-07-30 LAB — CK: Total CK: 325 U/L (ref 49–397)

## 2024-07-30 LAB — GLUCOSE, CAPILLARY: Glucose-Capillary: 376 mg/dL — ABNORMAL HIGH (ref 70–99)

## 2024-07-30 LAB — I-STAT CHEM 8, ED
BUN: 100 mg/dL — ABNORMAL HIGH (ref 8–23)
Calcium, Ion: 1.15 mmol/L (ref 1.15–1.40)
Chloride: 108 mmol/L (ref 98–111)
Creatinine, Ser: 5 mg/dL — ABNORMAL HIGH (ref 0.61–1.24)
Glucose, Bld: 372 mg/dL — ABNORMAL HIGH (ref 70–99)
HCT: 30 % — ABNORMAL LOW (ref 39.0–52.0)
Hemoglobin: 10.2 g/dL — ABNORMAL LOW (ref 13.0–17.0)
Potassium: 4.9 mmol/L (ref 3.5–5.1)
Sodium: 138 mmol/L (ref 135–145)
TCO2: 15 mmol/L — ABNORMAL LOW (ref 22–32)

## 2024-07-30 LAB — BETA-HYDROXYBUTYRIC ACID: Beta-Hydroxybutyric Acid: 0.26 mmol/L (ref 0.05–0.27)

## 2024-07-30 LAB — PROTIME-INR
INR: 1.3 — ABNORMAL HIGH (ref 0.8–1.2)
Prothrombin Time: 17.2 s — ABNORMAL HIGH (ref 11.4–15.2)

## 2024-07-30 LAB — MAGNESIUM: Magnesium: 2.8 mg/dL — ABNORMAL HIGH (ref 1.7–2.4)

## 2024-07-30 LAB — I-STAT CG4 LACTIC ACID, ED
Lactic Acid, Venous: 8.9 mmol/L (ref 0.5–1.9)
Lactic Acid, Venous: >15 mmol/L (ref 0.5–1.9)

## 2024-07-30 MED ORDER — NOREPINEPHRINE 4 MG/250ML-% IV SOLN
0.0000 ug/min | INTRAVENOUS | Status: DC
  Administered 2024-07-30: 2 ug/min via INTRAVENOUS
  Filled 2024-07-30: qty 250

## 2024-07-30 MED ORDER — CHLORHEXIDINE GLUCONATE CLOTH 2 % EX PADS
6.0000 | MEDICATED_PAD | Freq: Every day | CUTANEOUS | Status: DC
  Administered 2024-07-30 – 2024-08-04 (×6): 6 via TOPICAL

## 2024-07-30 MED ORDER — EPINEPHRINE 1 MG/10ML IV SOSY
PREFILLED_SYRINGE | INTRAVENOUS | Status: AC | PRN
  Administered 2024-07-30: 1 mg via INTRAVENOUS

## 2024-07-30 MED ORDER — LACTATED RINGERS IV SOLN: INTRAVENOUS | Status: DC

## 2024-07-30 MED ORDER — LACTATED RINGERS IV BOLUS (SEPSIS)
250.0000 mL | Freq: Once | INTRAVENOUS | Status: AC
  Administered 2024-07-30: 250 mL via INTRAVENOUS

## 2024-07-30 MED ORDER — AMIODARONE HCL IN DEXTROSE 360-4.14 MG/200ML-% IV SOLN: 30.0000 mg/h | INTRAVENOUS | Status: DC

## 2024-07-30 MED ORDER — VANCOMYCIN HCL IN DEXTROSE 1-5 GM/200ML-% IV SOLN: 1000.0000 mg | Freq: Once | INTRAVENOUS | Status: DC

## 2024-07-30 MED ORDER — ORAL CARE MOUTH RINSE: 15.0000 mL | OROMUCOSAL | Status: DC | PRN

## 2024-07-30 MED ORDER — HEPARIN SODIUM (PORCINE) 5000 UNIT/ML IJ SOLN: 5000.0000 [IU] | Freq: Two times a day (BID) | INTRAMUSCULAR | Status: DC

## 2024-07-30 MED ORDER — AMIODARONE HCL IN DEXTROSE 360-4.14 MG/200ML-% IV SOLN
60.0000 mg/h | INTRAVENOUS | Status: DC
  Administered 2024-07-30: 60 mg/h via INTRAVENOUS
  Filled 2024-07-30: qty 200

## 2024-07-30 MED ORDER — LACTATED RINGERS IV BOLUS (SEPSIS)
1000.0000 mL | Freq: Once | INTRAVENOUS | Status: AC
  Administered 2024-07-30: 1000 mL via INTRAVENOUS

## 2024-07-30 MED ORDER — DOCUSATE SODIUM 100 MG PO CAPS: 100.0000 mg | ORAL_CAPSULE | Freq: Two times a day (BID) | ORAL | Status: DC | PRN

## 2024-07-30 MED ORDER — PIPERACILLIN-TAZOBACTAM 3.375 G IVPB 30 MIN
3.3750 g | Freq: Once | INTRAVENOUS | Status: AC
  Administered 2024-07-30: 3.375 g via INTRAVENOUS
  Filled 2024-07-30: qty 50

## 2024-07-30 MED ORDER — ORAL CARE MOUTH RINSE
15.0000 mL | OROMUCOSAL | Status: DC
  Administered 2024-07-30 – 2024-08-03 (×43): 15 mL via OROMUCOSAL

## 2024-07-30 MED ORDER — PROPOFOL 1000 MG/100ML IV EMUL
0.0000 ug/kg/min | INTRAVENOUS | Status: DC
  Filled 2024-07-30: qty 100

## 2024-07-30 MED ORDER — INSULIN ASPART 100 UNIT/ML IJ SOLN
0.0000 [IU] | INTRAMUSCULAR | Status: DC
  Administered 2024-07-30: 15 [IU] via SUBCUTANEOUS
  Administered 2024-07-31: 5 [IU] via SUBCUTANEOUS

## 2024-07-30 MED ORDER — VANCOMYCIN HCL IN DEXTROSE 1-5 GM/200ML-% IV SOLN
1000.0000 mg | Freq: Once | INTRAVENOUS | Status: AC
  Administered 2024-07-30: 1000 mg via INTRAVENOUS
  Filled 2024-07-30: qty 200

## 2024-07-30 MED ORDER — SODIUM BICARBONATE 8.4 % IV SOLN
50.0000 meq | Freq: Once | INTRAVENOUS | Status: AC
  Administered 2024-07-30: 50 meq via INTRAVENOUS
  Filled 2024-07-30: qty 50

## 2024-07-30 MED ORDER — AMIODARONE IV BOLUS ONLY 150 MG/100ML
INTRAVENOUS | Status: DC | PRN
  Administered 2024-07-30: 150 mg via INTRAVENOUS

## 2024-07-30 MED ORDER — FENTANYL 2500MCG IN NS 250ML (10MCG/ML) PREMIX INFUSION
0.0000 ug/h | INTRAVENOUS | Status: DC
  Administered 2024-07-30 – 2024-08-01 (×2): 50 ug/h via INTRAVENOUS
  Filled 2024-07-30 (×2): qty 250

## 2024-07-30 MED ORDER — POLYETHYLENE GLYCOL 3350 17 G PO PACK: 17.0000 g | PACK | Freq: Every day | ORAL | Status: DC | PRN

## 2024-07-30 MED ORDER — VANCOMYCIN HCL 1500 MG/300ML IV SOLN
1500.0000 mg | Freq: Once | INTRAVENOUS | Status: DC
  Filled 2024-07-30: qty 300

## 2024-07-30 MED ORDER — ETOMIDATE 2 MG/ML IV SOLN
INTRAVENOUS | Status: AC | PRN
Start: 2024-07-30 — End: 2024-07-30
  Administered 2024-07-30: 10 mg via INTRAVENOUS

## 2024-07-30 MED ORDER — FENTANYL BOLUS VIA INFUSION
25.0000 ug | INTRAVENOUS | Status: DC | PRN
  Administered 2024-07-31 – 2024-08-02 (×4): 50 ug via INTRAVENOUS

## 2024-07-30 MED ORDER — SODIUM CHLORIDE 0.9 % IV SOLN: 250.0000 mL | INTRAVENOUS | Status: AC

## 2024-07-30 MED ORDER — ROCURONIUM BROMIDE 10 MG/ML (PF) SYRINGE
PREFILLED_SYRINGE | INTRAVENOUS | Status: AC | PRN
Start: 2024-07-30 — End: 2024-07-30
  Administered 2024-07-30: 60 mg via INTRAVENOUS

## 2024-07-30 MED ORDER — HEPARIN SODIUM (PORCINE) 5000 UNIT/ML IJ SOLN: 5000.0000 [IU] | Freq: Three times a day (TID) | INTRAMUSCULAR | Status: DC

## 2024-07-30 NOTE — Progress Notes (Signed)
 Pt transported from ED Tr C to 2H15 without complications.

## 2024-07-30 NOTE — ED Provider Notes (Signed)
 [place] EMERGENCY DEPARTMENT AT [facility] Provider Note   CSN: [id number] Arrival date & time: 07/30/24  1645     Patient presents with: Failure To Thrive and Wound Check   Jonathan Baird is a 89 y.o. male.   Patient is an 89 year old male who presents to the emergency department secondary to altered mental status and refusing to eat over the past 48 hours.  He does have noted wounds to his sacrum as well as bilateral feet.  Patient is unable to provide his own history given his altered mental state at this time.   Wound Check       Prior to Admission medications   Medication Sig Start Date End Date Taking? Authorizing Provider  amLODipine  (NORVASC ) 10 MG tablet Take 1 tablet (10 mg total) by mouth daily. 06/05/24   Raenelle Coria, MD  aspirin  81 MG chewable tablet Chew 1 tablet (81 mg total) by mouth daily. 06/05/24   Ghimire, Kuber, MD  docusate (COLACE) 50 MG/5ML liquid Take 10 mLs (100 mg total) by mouth 2 (two) times daily. 06/04/24   Ghimire, Kuber, MD  fluticasone (FLONASE) 50 MCG/ACT nasal spray Place 1 spray into both nostrils 2 (two) times daily. Patient not taking: Reported on 05/22/2024 03/12/24   [provider]  GVOKE HYPOPEN 2-PACK 1 MG/0.2ML SOAJ Inject 1 mg into the skin once as needed (for hypoglycemia). 02/08/22   [provider]  insulin  glargine-yfgn (SEMGLEE ) 100 UNIT/ML injection Inject 0.1 mLs (10 Units total) into the skin daily. 06/05/24   Ghimire, Kuber, MD  labetalol  (NORMODYNE ) 100 MG tablet Take 1 tablet (100 mg total) by mouth 2 (two) times daily. 06/04/24   Ghimire, Kuber, MD  Multiple Vitamin (MULTIVITAMIN WITH MINERALS) TABS tablet Take 1 tablet by mouth daily. 06/05/24   Ghimire, Kuber, MD  nutrition supplement, JUVEN, (JUVEN) PACK Take 1 packet by mouth 2 (two) times daily between meals. 06/04/24   Ghimire, Kuber, MD  pantoprazole  (PROTONIX ) 40 MG tablet Take 1 tablet (40 mg total) by mouth daily. 06/05/24   Ghimire, Kuber,  MD  polyethylene glycol (MIRALAX  / GLYCOLAX ) 17 g packet Take 17 g by mouth daily. 06/05/24   Ghimire, Kuber, MD    Allergies: Patient has no known allergies.    Review of Systems  Psychiatric/Behavioral:  Positive for confusion.   All other systems reviewed and are negative.   Updated Vital Signs BP 132/82   Pulse (!) 123   Temp (!) 97.3 F (36.3 C) (Oral)   Resp (!) 24   Physical Exam Vitals and nursing note reviewed.  Constitutional:      Appearance: He is ill-appearing.     Comments: Cachectic  HENT:     Head: Normocephalic and atraumatic.     Nose: Nose normal.     Mouth/Throat:     Mouth: Mucous membranes are moist.  Eyes:     Extraocular Movements: Extraocular movements intact.     Conjunctiva/sclera: Conjunctivae normal.     Pupils: Pupils are equal, round, and reactive to light.  Cardiovascular:     Rate and Rhythm: Tachycardia present. Rhythm irregular.     Pulses: Normal pulses.     Heart sounds: Normal heart sounds. No murmur heard.    No gallop.  Pulmonary:     Effort: Pulmonary effort is normal. No respiratory distress.     Breath sounds: Normal breath sounds. No stridor. No wheezing, rhonchi or rales.  Abdominal:     General: Abdomen is flat.  Bowel sounds are normal. There is no distension.     Palpations: Abdomen is soft.     Tenderness: There is no abdominal tenderness. There is no guarding.  Musculoskeletal:        General: Normal range of motion.     Cervical back: Normal range of motion and neck supple.     Right lower leg: No edema.     Left lower leg: No edema.     Comments: Pressure wounds noted to bilateral heels, no other obvious deformity or bruising, full range of motion noted of all extremities  Skin:    General: Skin is warm and dry.  Neurological:     Mental Status: He is alert. He is disoriented.     (all labs ordered are listed, but only abnormal results are displayed) Labs Reviewed  I-STAT CG4 LACTIC ACID, ED - Abnormal;  Notable for the following components:      Result Value   Lactic Acid, Venous 8.9 (*)    All other components within normal limits  I-STAT CHEM 8, ED - Abnormal; Notable for the following components:   BUN 100 (*)    Creatinine, Ser 5.00 (*)    Glucose, Bld 372 (*)    TCO2 15 (*)    Hemoglobin 10.2 (*)    HCT 30.0 (*)    All other components within normal limits  I-STAT VENOUS BLOOD GAS, ED - Abnormal; Notable for the following components:   pH, Ven 7.206 (*)    pCO2, Ven 34.1 (*)    pO2, Ven 24 (*)    Bicarbonate 13.5 (*)    TCO2 15 (*)    Acid-base deficit 13.0 (*)    HCT 31.0 (*)    Hemoglobin 10.5 (*)    All other components within normal limits  RESP PANEL BY RT-PCR (RSV, FLU A&B, COVID)  RVPGX2  CULTURE, BLOOD (ROUTINE X 2)  CULTURE, BLOOD (ROUTINE X 2)  COMPREHENSIVE METABOLIC PANEL WITH GFR  CBC WITH DIFFERENTIAL/PLATELET  PROTIME-INR  URINALYSIS, W/ REFLEX TO CULTURE (INFECTION SUSPECTED)  MAGNESIUM   CK  TSH  TRIGLYCERIDES  TROPONIN I (HIGH SENSITIVITY)    EKG: EKG Interpretation Date/Time:  Thursday July 30 2024 16:53:10 EDT Ventricular Rate:  124 PR Interval:  148 QRS Duration:  149 QT Interval:  415 QTC Calculation: 611 R Axis:   256  Text Interpretation: Atrial fibrillation Right bundle branch block Consider left ventricular hypertrophy Confirmed by Darra Chew [phone]) on 07/30/2024 4:58:11 PM  Radiology: No results found.   Procedures   Medications Ordered in the ED  lactated ringers  infusion ( Intravenous New Bag/Given 07/30/24 1730)  lactated ringers  bolus 1,000 mL (1,000 mLs Intravenous New Bag/Given 07/30/24 1723)    And  lactated ringers  bolus 1,000 mL (has no administration in time range)    And  lactated ringers  bolus 250 mL (has no administration in time range)  piperacillin -tazobactam (ZOSYN ) IVPB 3.375 g (has no administration in time range)  EPINEPHrine  (ADRENALIN ) 1 MG/10ML injection (1 mg Intravenous Given 07/30/24 1708)   amiodarone  (NEXTERONE ) 1.5 mg/mL IV bolus only (150 mg Intravenous New Bag/Given 07/30/24 1709)  etomidate  (AMIDATE ) injection (10 mg Intravenous Given 07/30/24 1711)  rocuronium  (ZEMURON ) injection (60 mg Intravenous Given 07/30/24 1712)  vancomycin  (VANCOCIN ) IVPB 1000 mg/200 mL premix (has no administration in time range)  amiodarone  (NEXTERONE  PREMIX) 360-4.14 MG/200ML-% (1.8 mg/mL) IV infusion (60 mg/hr Intravenous New Bag/Given 07/30/24 1722)  amiodarone  (NEXTERONE  PREMIX) 360-4.14 MG/200ML-% (1.8 mg/mL) IV infusion (has no administration  in time range)  fentaNYL  in NS (10mcg/ml) infusion-PREMIX (50 mcg/hr Intravenous New Bag/Given 07/30/24 1724)  fentaNYL  (SUBLIMAZE ) bolus via infusion 25-100 mcg (has no administration in time range)  propofol  (DIPRIVAN ) 1000 MG/100ML infusion (has no administration in time range)    Clinical Course as of 07/30/24 1734  Thu Jul 30, 2024  1721 Shortly after coming into the emergency department the patient did become pulseless.  CPR was initiated at that time with ROSC.  Patient has been intubated.  I did touch base with Jonathan Baird who is the patient's daughter who did note that they would want CPR and full measures at this time. [CR]    Clinical Course User Index [CR] Daralene Lonni BIRCH, PA-C                                 Medical Decision Making Amount and/or Complexity of Data Reviewed Labs: ordered. Radiology: ordered.  Risk Prescription drug management. Decision regarding hospitalization.   This patient presents to the ED for concern of altered mental status, this involves an extensive number of treatment options, and is a complaint that carries with it a high risk of complications and morbidity.  The differential diagnosis includes sepsis, pneumonia, urinary tract infection, cellulitis or abscess, electrolyte derangement, acute kidney failure, dehydration, DKA, HHS, CVA, TIA   Co morbidities that complicate the patient  evaluation  Diabetes, hypertension, chronic kidney disease   Additional history obtained:  Additional history obtained from family External records from outside source obtained and reviewed including medical records   Lab Tests:  I Ordered, and personally interpreted labs.  The pertinent results include: Noted leukocytosis, anemia, elevated creatinine from baseline, normal electrolytes, normal liver function, elevated troponin but at baseline, normal CK, uptrending lactic acid, pH of 7.19   Imaging Studies ordered:  I ordered imaging studies including chest x-ray, CT head, chest abdomen and pelvis pending I independently visualized and interpreted imaging which showed right basilar opacity I agree with the radiologist interpretation   Cardiac Monitoring: / EKG:  The patient was maintained on a cardiac monitor.  I personally viewed and interpreted the cardiac monitored which showed an underlying rhythm of: Ventricular tachycardia, no STEMI   Consultations Obtained:  I requested consultation with the intensivist,  and discussed lab and imaging findings as well as pertinent plan - they recommend: Admission   Problem List / ED Course / Critical interventions / Medication management  Patient is stable at this time but does remain intubated and critically ill.  Patient has been given IV fluids, bicarb, IV antibiotics for possible sepsis.  He does have an uptrending lactic acid which I do suspect is secondary to the fact that he obtained CPR shortly after arrival.  Shortly after arrival the patient did have runs of V. tach and we did put in for amiodarone  bolus and drip.  He did go into cardiac arrest before this could be provided.  CPR was performed and the patient was subsequently intubated.  I did discuss patient's CODE STATUS with family who did note that they would want CPR and intubation performed.  Patient does have wounds noted to bilateral heels as well as to his sacral region.   Suspect that this is the source of his sepsis at this time.  He has been started on a bicarb drip as well given his acidosis.  Patient case has been discussed with the intensivist who will evaluate  and has been accepted for admission.  Will continue to monitor.  He has not needed any pressors as of yet.  Patient has been fully evaluated by attending physician who has been involved in his care. I ordered medication including IV fluids, vancomycin , Zosyn , bicarb, amiodarone  for sepsis, acidosis, acute kidney injury Reevaluation of the patient after these medicines showed that the patient improved I have reviewed the patients home medicines and have made adjustments as needed   Social Determinants of Health:  None   Test / Admission - Considered:  Admission     Final diagnoses:  None    ED Discharge Orders     None          Daralene Lonni JONETTA DEVONNA 07/30/24 1944    Darra Fonda MATSU, MD 07/30/24 2318

## 2024-07-30 NOTE — ED Notes (Signed)
 Family (wife and dtr) remains at bedside but questioning when they can leave. Advised to stay and speak with critical care provider first.

## 2024-07-30 NOTE — H&P (Signed)
 NAME:  Jonathan Baird, MRN:  [id number], DOB:  10-02-1933, LOS: 0 ADMISSION DATE:  07/30/2024, CONSULTATION DATE:  9/ REFERRING MD: Darra Fonda MATSU, MD, CHIEF COMPLAINT:  Lethargy for 2 days   History of Present Illness:  A 89 year old male patient with HTN, dyslipidemia, dementia, PVD, DM-2, CKD-IV, right RCA stenosis, carotid stenosis on anticoagulation and ASA, colon cancer s/p hemicolectomy, bed bound since last admission May 22, 2024 (respiratory distress, managed conservatively SBO, aspiration pneumonia, witnessed seizure, AMS, and septic shock), anemia of chronic illness, heels and sacral bedsore, severe PCM, and dysphagia (modified diet), who was brought to ED tonight due to poor oral intake, AMS, and lethargy for 2 days. He is refusing to eat over the past 48 hours. He does have noted wounds to his sacrum (getting worse) as well as bilateral feet. While he was in ED, he developed ventricular tachycardia although maintains a pulse during these episodes. Shortly, he lost pulses and required CPR. He received 1 mg epinephrine  along with amiodarone  150 mg bolus and ROSC was achieved after 6 min.  Patient was intubated with ETT, on PRVC 24/590/+5/50%. Pertinent  Medical History  HTN, dyslipidemia, dementia, PVD, DM-2, CKD-IV, right RCA stenosis, colon cancer s/p hemicolectomy, anemia of chronic illness, heels and sacral bedsore, severe PCM, dysphagia (modified diet), SBO (managed conservatively), carotid stenosis on anticoagulation and ASA.   Significant Hospital Events: Including procedures, antibiotic start and stop dates in addition to other pertinent events   9/18: ED, 2.25 L boluses, ACLS, VT, Amiodarone , Epi, Zosyn , Vanco  PCCM was consulted for admission    Interim History / Subjective:    Objective    Blood pressure (!) 91/51, pulse 62, temperature (!) 96.4 F (35.8 C), resp. rate (!) 24, height 6' (1.829 m), SpO2 100%.    Vent Mode: PRVC FiO2 (%):  [50 %-100 %] 50 % Set Rate:   [24 bmp] 24 bmp Vt Set:  [590 mL] 590 mL PEEP:  [5 cmH20] 5 cmH20 Plateau Pressure:  [18 cmH20] 18 cmH20   Intake/Output Summary (Last 24 hours) at 07/30/2024 2006 Last data filed at 07/30/2024 2004 Gross per 24 hour  Intake 2239.82 ml  Output 30 ml  Net 2209.82 ml   There were no vitals filed for this visit.  Examination: General: intubated, cachetic, foul smelling sacral deep unstageable bedsore, both heel unstageable bedsores.  HENT: PERL. No LNE or thyromegaly. No JVD Lungs: symmetrical air entry bilaterally. No crackles or wheezing Cardiovascular: NL S1/S2. No m/g/r Abdomen: no distension or tenderness Extremities: no edema. Symmetrical  Neuro: sedated    Resolved problem list   Assessment and Plan  Sepsis due to infected sacral bedsore and possible aspiration PNA -admit to ICU -s/p Vanco and Zosyn  -Bcx2 already obtained in ED  VT cardiac arrest. ROSC after 6 min  HTN, dyslipidemia, PVD, CAD, carotid stenosis on anticoagulation and ASA  Lactic acidosis/AGMA: due to sepsis and cardiac arrest  -IVF  Dementia Bed bound status for 2.5 months Poor functional status  DM-2 (HbA1c 7.6%)  CKD-IV  Anemia of chronic illness, ?Fe def  Heels and sacral bedsore: unstageable  Severe PCM, dysphagia (on modified diet)  DNR/DNI -d/w daughter and wife about his poor clinical condition, co morbidities, poor prognosis, and poor functional status. They agreed to DNR/DNI and transition to comfort care in am.   -Consult palliative team     Labs   CBC: Recent Labs  Lab 07/30/24 1704 07/30/24 1714 07/30/24 1716 07/30/24 1815  WBC 38.4*  --   --   --  NEUTROABS 34.9*  --   --   --   HGB 9.5* 10.2* 10.5* 8.2*  HCT 29.5* 30.0* 31.0* 24.0*  MCV 79.1*  --   --   --   PLT 417*  --   --   --     Basic Metabolic Panel: Recent Labs  Lab 07/30/24 1704 07/30/24 1714 07/30/24 1716 07/30/24 1815  NA 137 138 137 137  K 5.0 4.9 4.9 4.4  CL 102 108  --   --   CO2 13*  --    --   --   GLUCOSE 397* 372*  --   --   BUN 97* 100*  --   --   CREATININE 4.63* 5.00*  --   --   CALCIUM  9.2  --   --   --   MG 2.8*  --   --   --    GFR: CrCl cannot be calculated (Unknown ideal weight.). Recent Labs  Lab 07/30/24 1704 07/30/24 1715 07/30/24 1857  WBC 38.4*  --   --   LATICACIDVEN  --  8.9* >15.0*    Liver Function Tests: Recent Labs  Lab 07/30/24 1704  AST 51*  ALT 40  ALKPHOS 106  BILITOT 0.8  PROT 7.8  ALBUMIN 1.8*   No results for input(s): LIPASE, AMYLASE in the last 168 hours. No results for input(s): AMMONIA in the last 168 hours.  ABG    Component Value Date/Time   PHART 7.194 (LL) 07/30/2024 1815   PCO2ART 32.0 07/30/2024 1815   PO2ART 372 (H) 07/30/2024 1815   HCO3 12.5 (L) 07/30/2024 1815   TCO2 14 (L) 07/30/2024 1815   ACIDBASEDEF 15.0 (H) 07/30/2024 1815   O2SAT 100 07/30/2024 1815     Coagulation Profile: Recent Labs  Lab 07/30/24 1704  INR 1.3*    Cardiac Enzymes: Recent Labs  Lab 07/30/24 1704  CKTOTAL 325    HbA1C: Hgb A1c MFr Bld  Date/Time Value Ref Range Status  05/21/2024 04:44 AM 7.6 (H) 4.8 - 5.6 % Final    Comment:    (NOTE)         Prediabetes: 5.7 - 6.4         Diabetes: >6.4         Glycemic control for adults with diabetes: <7.0   10/20/2023 01:25 AM 8.2 (H) 4.8 - 5.6 % Final    Comment:    (NOTE)         Prediabetes: 5.7 - 6.4         Diabetes: >6.4         Glycemic control for adults with diabetes: <7.0     CBG: No results for input(s): GLUCAP in the last 168 hours.  Review of Systems:   Intubated   Past Medical History:  He,  has a past medical history of Carotid artery stenosis, CKD (chronic kidney disease), stage IV (HCC), Colon cancer (HCC) (10/12/2004), Dementia (HCC), Diabetes mellitus, HLD (hyperlipidemia), HTN (hypertension), and Peripheral vascular disease (HCC).   Surgical History:   Past Surgical History:  Procedure Laterality Date   hemocolectomy  11/13/2003      Social History:   reports that he has never smoked. He has never used smokeless tobacco. He reports that he does not drink alcohol and does not use drugs.   Family History:  His family history includes Cancer in his mother and sister; Diabetes in his father.   Allergies No Known Allergies   Home Medications  Prior  to Admission medications   Medication Sig Start Date End Date Taking? Authorizing Provider  aspirin  81 MG chewable tablet Chew 1 tablet (81 mg total) by mouth daily. 06/05/24  Yes Ghimire, Kuber, MD  gabapentin (NEURONTIN) 100 MG capsule Take 100 mg by mouth 3 (three) times daily. 06/30/24  Yes [provider]  GVOKE HYPOPEN 2-PACK 1 MG/0.2ML SOAJ Inject 1 mg into the skin once as needed (for hypoglycemia). 02/08/22  Yes [provider]  hydrALAZINE  (APRESOLINE ) 50 MG tablet Take 50 mg by mouth 3 (three) times daily.   Yes [provider]  insulin  glargine (LANTUS ) 100 UNIT/ML Solostar Pen Inject 4 Units into the skin daily.   Yes [provider]  insulin  lispro (HUMALOG) 100 UNIT/ML injection Inject 4 Units into the skin 3 (three) times daily before meals.   Yes [provider]  rivaroxaban  (XARELTO ) 2.5 MG TABS tablet Take 2.5 mg by mouth 2 (two) times daily.   Yes [provider]  sodium bicarbonate  650 MG tablet Take 650 mg by mouth 3 (three) times daily before meals.   Yes [provider]  nutrition supplement, JUVEN, (JUVEN) PACK Take 1 packet by mouth 2 (two) times daily between meals. 06/04/24   Raenelle Coria, MD     Critical care time: 56 min    Mancel Ply, MD [facility] Pager: see AMION

## 2024-07-30 NOTE — Progress Notes (Addendum)
 ED Pharmacy Antibiotic Sign Off An antibiotic consult was received from an ED provider for vancomycin  and zosyn  per pharmacy dosing for sepsis. A chart review was completed to assess appropriateness.  The following one time order(s) were placed per pharmacy consult:  vancomycin  1000 mg x 1 dose zosyn  3.375g x 1 dose  Further antibiotic and/or antibiotic pharmacy consults should be ordered by the admitting provider if indicated.   Thank you for allowing pharmacy to be a part of this patient's care.   Dorn Buttner, PharmD, BCPS 07/30/2024 5:02 PM ED Clinical Pharmacist -  [phone]

## 2024-07-30 NOTE — ED Notes (Signed)
 Bair hugger applied.

## 2024-07-30 NOTE — Progress Notes (Signed)
 Pt transported from ED31 to CT, then to ED Tr C without complications.

## 2024-07-30 NOTE — Sepsis Progress Note (Signed)
 Elink monitoring for the code sepsis protocol.

## 2024-07-30 NOTE — ED Provider Notes (Signed)
 Medical screening examination/treatment/procedure(s) were conducted as a shared visit with non-physician practitioner(s) and myself.  I personally evaluated the patient during the encounter.  Patient arrives to the emergency department with concern for worsening sacral wound and altered mental status.  Arrives less responsive and intermittently appears to be going in and out of ventricular tachycardia although maintains a pulse during these episodes.  Shortly after arrival, the patient lost pulses and required CPR.  He received 1 epinephrine  along with amiodarone  and ROSC was achieved.  Patient was intubated as documented below.    Cardiopulmonary Resuscitation (CPR) Procedure Note Directed/Performed by: Fonda KANDICE Law I personally directed ancillary staff and/or performed CPR in an effort to regain return of spontaneous circulation and to maintain cardiac, neuro and systemic perfusion.     Procedure Name: Intubation Date/Time: 07/30/2024 6:48 PM  Performed by: Law Fonda KANDICE, MDPre-anesthesia Checklist: Patient identified, Patient being monitored, Emergency Drugs available, Timeout performed and Suction available Oxygen Delivery Method: Non-rebreather mask Preoxygenation: Pre-oxygenation with 100% oxygen Induction Type: Rapid sequence Ventilation: Mask ventilation without difficulty Laryngoscope Size: Glidescope and 3 Grade View: Grade II Tube size: 7.5 mm Number of attempts: 1 Airway Equipment and Method: Video-laryngoscopy Placement Confirmation: ETT inserted through vocal cords under direct vision, CO2 detector and Breath sounds checked- equal and bilateral Secured at: 27 cm Tube secured with: ETT holder Dental Injury: Teeth and Oropharynx as per pre-operative assessment     .Critical Care  Performed by: Law Fonda KANDICE, MD Authorized by: Law Fonda KANDICE, MD   Critical care provider statement:    Critical care time (minutes):  75   Critical care time was exclusive of:  Separately  billable procedures and treating other patients and teaching time   Critical care was necessary to treat or prevent imminent or life-threatening deterioration of the following conditions:  Sepsis, shock, respiratory failure and circulatory failure   Critical care was time spent personally by me on the following activities:  Development of treatment plan with patient or surrogate, discussions with consultants, evaluation of patient's response to treatment, examination of patient, ordering and review of laboratory studies, ordering and review of radiographic studies, ordering and performing treatments and interventions, pulse oximetry, re-evaluation of patient's condition and review of old charts   I assumed direction of critical care for this patient from another provider in my specialty: no     Care discussed with: admitting provider      Fonda Law, MD Emergency Medicine    Kalisha Keadle, Fonda KANDICE, MD 07/30/24 2317

## 2024-07-30 NOTE — Progress Notes (Signed)
   07/30/24 2300  Spiritual Encounters  Type of Visit Initial  Care provided to: [facility] present during encounter Nurse  Referral source Nurse (RN/NT/LPN);Family  Reason for visit End-of-life  OnCall Visit Yes   Chaplain was paged for family support. The family was present at the bedside. The patient's wife and daughter were in deep sorrow. Chaplain was compassionately present, provided support and offered a prayer.   M.Kubra Susanna Kerry Resident [phone]

## 2024-07-30 NOTE — ED Notes (Signed)
 Pt transported to CT ?

## 2024-07-30 NOTE — ED Notes (Signed)
Critical care provider at bedside.  

## 2024-07-30 NOTE — Progress Notes (Signed)
 eLink Physician-Brief Progress Note Patient Name: MAJD TISSUE DOB: 26-Oct-1933 MRN: [id number]   Date of Service  07/30/2024  HPI/Events of Note  89 year old male with a history of metabolic syndrome, peripheral vascular disease, CKD, and colon cancer status post hemicolectomy who is bedbound and has poor oral intake due to dysphagia brought in due to encephalopathy and lethargy.  He had a brief cardiac arrest and was intubated and thus referred to critical care.  On examination, the patient is hypotensive saturating 97% on mechanical ventilation.  Results show severe metabolic acidosis with anion gap, lactic acidosis, leukocytosis, anemia.  Grossly positive urine.  Cultures collected.  Imaging reviewed.  eICU Interventions  Add norepinephrine  as needed to maintain MAP greater than 65  Add sliding scale insulin   Maintain antibiotics for now  DVT prophylaxis with heparin  GI prophylaxis not indicated with short duration intubation Transition to comfort care in the morning     Intervention Category Evaluation Type: New Patient Evaluation  Marjorie Lussier 07/30/2024, 9:31 PM

## 2024-07-30 NOTE — ED Notes (Signed)
 Lactic results to dr.long by at

## 2024-07-30 NOTE — Progress Notes (Signed)
 Called ED for report at 2051, waiting for call back.

## 2024-07-30 NOTE — Sepsis Progress Note (Signed)
 Notified provider of need to order repeat lactic acid.

## 2024-07-30 NOTE — ED Triage Notes (Signed)
 Pt arrives via EMS from home with reports of infected wounds to sacrum and bilateral heels. Pt also reports pt is refusing to eat for past 48 hours. CBG in 300s for EMS, pt has not had meds due to not eating. PA at bedside due to vtach runs.

## 2024-07-31 ENCOUNTER — Other Ambulatory Visit: Payer: Self-pay

## 2024-07-31 DIAGNOSIS — E872 Acidosis, unspecified: Secondary | ICD-10-CM

## 2024-07-31 DIAGNOSIS — A419 Sepsis, unspecified organism: Secondary | ICD-10-CM | POA: Diagnosis not present

## 2024-07-31 DIAGNOSIS — R6520 Severe sepsis without septic shock: Secondary | ICD-10-CM

## 2024-07-31 DIAGNOSIS — Z66 Do not resuscitate: Secondary | ICD-10-CM

## 2024-07-31 DIAGNOSIS — R4182 Altered mental status, unspecified: Secondary | ICD-10-CM | POA: Diagnosis not present

## 2024-07-31 DIAGNOSIS — E119 Type 2 diabetes mellitus without complications: Secondary | ICD-10-CM

## 2024-07-31 DIAGNOSIS — E43 Unspecified severe protein-calorie malnutrition: Secondary | ICD-10-CM

## 2024-07-31 DIAGNOSIS — Z7189 Other specified counseling: Secondary | ICD-10-CM

## 2024-07-31 DIAGNOSIS — N170 Acute kidney failure with tubular necrosis: Secondary | ICD-10-CM

## 2024-07-31 DIAGNOSIS — D631 Anemia in chronic kidney disease: Secondary | ICD-10-CM

## 2024-07-31 DIAGNOSIS — Z789 Other specified health status: Secondary | ICD-10-CM

## 2024-07-31 DIAGNOSIS — N184 Chronic kidney disease, stage 4 (severe): Secondary | ICD-10-CM

## 2024-07-31 DIAGNOSIS — N179 Acute kidney failure, unspecified: Secondary | ICD-10-CM

## 2024-07-31 DIAGNOSIS — Z515 Encounter for palliative care: Secondary | ICD-10-CM | POA: Diagnosis not present

## 2024-07-31 DIAGNOSIS — J9601 Acute respiratory failure with hypoxia: Secondary | ICD-10-CM | POA: Diagnosis not present

## 2024-07-31 DIAGNOSIS — I469 Cardiac arrest, cause unspecified: Secondary | ICD-10-CM

## 2024-07-31 LAB — BLOOD CULTURE ID PANEL (REFLEXED) - BCID2

## 2024-07-31 LAB — BASIC METABOLIC PANEL WITH GFR
Anion gap: 20 — ABNORMAL HIGH (ref 5–15)
BUN: 103 mg/dL — ABNORMAL HIGH (ref 8–23)
CO2: 15 mmol/L — ABNORMAL LOW (ref 22–32)
Calcium: 8.6 mg/dL — ABNORMAL LOW (ref 8.9–10.3)
Chloride: 104 mmol/L (ref 98–111)
Creatinine, Ser: 5.23 mg/dL — ABNORMAL HIGH (ref 0.61–1.24)
GFR, Estimated: 10 mL/min — ABNORMAL LOW (ref >60)
Glucose, Bld: 201 mg/dL — ABNORMAL HIGH (ref 70–99)
Potassium: 6 mmol/L — ABNORMAL HIGH (ref 3.5–5.1)
Sodium: 139 mmol/L (ref 135–145)

## 2024-07-31 LAB — GLUCOSE, CAPILLARY
Glucose-Capillary: 130 mg/dL — ABNORMAL HIGH (ref 70–99)
Glucose-Capillary: 136 mg/dL — ABNORMAL HIGH (ref 70–99)
Glucose-Capillary: 143 mg/dL — ABNORMAL HIGH (ref 70–99)
Glucose-Capillary: 184 mg/dL — ABNORMAL HIGH (ref 70–99)
Glucose-Capillary: 211 mg/dL — ABNORMAL HIGH (ref 70–99)
Glucose-Capillary: 231 mg/dL — ABNORMAL HIGH (ref 70–99)
Glucose-Capillary: 280 mg/dL — ABNORMAL HIGH (ref 70–99)
Glucose-Capillary: 391 mg/dL — ABNORMAL HIGH (ref 70–99)

## 2024-07-31 MED ORDER — NOREPINEPHRINE 4 MG/250ML-% IV SOLN
INTRAVENOUS | Status: AC
  Administered 2024-07-31: 2 ug/min via INTRAVENOUS
  Filled 2024-07-31: qty 250

## 2024-07-31 MED ORDER — SODIUM ZIRCONIUM CYCLOSILICATE 10 G PO PACK
10.0000 g | PACK | Freq: Once | ORAL | Status: AC
  Administered 2024-07-31: 10 g
  Filled 2024-07-31: qty 1

## 2024-07-31 MED ORDER — INSULIN ASPART 100 UNIT/ML IV SOLN
10.0000 [IU] | Freq: Once | INTRAVENOUS | Status: AC
Start: 2024-07-31 — End: 2024-07-31
  Administered 2024-07-31: 10 [IU] via INTRAVENOUS

## 2024-07-31 MED ORDER — PIPERACILLIN-TAZOBACTAM IN DEX 2-0.25 GM/50ML IV SOLN
2.2500 g | Freq: Three times a day (TID) | INTRAVENOUS | Status: DC
  Administered 2024-07-31 – 2024-08-01 (×4): 2.25 g via INTRAVENOUS
  Filled 2024-07-31 (×5): qty 50

## 2024-07-31 MED ORDER — DEXTROSE 50 % IV SOLN
1.0000 | Freq: Once | INTRAVENOUS | Status: AC
Start: 2024-07-31 — End: 2024-07-31
  Administered 2024-07-31: 50 mL via INTRAVENOUS
  Filled 2024-07-31: qty 50

## 2024-07-31 MED ORDER — MEDIHONEY WOUND/BURN DRESSING EX PSTE
1.0000 | PASTE | Freq: Every day | CUTANEOUS | Status: DC
  Administered 2024-07-31 – 2024-08-06 (×5): 1 via TOPICAL
  Filled 2024-07-31 (×2): qty 44

## 2024-07-31 MED ORDER — SODIUM BICARBONATE 8.4 % IV SOLN
100.0000 meq | Freq: Once | INTRAVENOUS | Status: AC
  Administered 2024-07-31: 100 meq via INTRAVENOUS
  Filled 2024-07-31: qty 100

## 2024-07-31 MED ORDER — SODIUM ZIRCONIUM CYCLOSILICATE 10 G PO PACK: 10.0000 g | PACK | Freq: Once | ORAL | Status: DC

## 2024-07-31 MED ORDER — NOREPINEPHRINE 4 MG/250ML-% IV SOLN
0.0000 ug/min | INTRAVENOUS | Status: DC
  Filled 2024-07-31: qty 250

## 2024-07-31 MED ORDER — PANTOPRAZOLE SODIUM 40 MG IV SOLR
40.0000 mg | Freq: Every day | INTRAVENOUS | Status: DC
  Administered 2024-07-31 – 2024-08-01 (×2): 40 mg via INTRAVENOUS
  Filled 2024-07-31 (×2): qty 10

## 2024-07-31 MED ORDER — SODIUM CHLORIDE 0.9 % IV SOLN
250.0000 mL | INTRAVENOUS | Status: AC
  Administered 2024-07-31: 250 mL via INTRAVENOUS

## 2024-07-31 NOTE — IPAL (Signed)
  Interdisciplinary Goals of Care Family Meeting   Date carried out:: 07/31/2024  Location of the meeting: Phone conference  Member's involved: Physician and Family Member or next of kin  Durable Power of Attorney or acting medical decision maker: Wife and daughter    Discussion: We discussed goals of care for Jonathan Baird .   I discussed the patient's current status, goals for care with [name] by phone.  I explained his acute and chronic issues, active treatment plan, prognosis for meaningful recovery.  She indicates that both she and her mother understand that this illness is severe, that prognosis is poor and that the end may be near.  They have asked that we continue antibiotics, mechanical ventilation, any appropriate wound care.  Their goal would be for him to improve enough to be off mechanical ventilation or any extraordinary care, possibly consider home with hospice care.  I have explained that is unclear to me whether he can improved that degree but that we can continue to support and try to work towards extubation to meet this goal in the next day or so.  I have advised that we do not escalate his care in any way, perform invasive procedures, and Jonathan Baird agrees with this plan.  Code status: Full DNR  Disposition: Continue current acute care   Time spent for the meeting: 15 minutes  Lamar GORMAN Chris 07/31/2024, 2:28 PM

## 2024-07-31 NOTE — Consult Note (Signed)
 Palliative Care Consult Note                                  Date: 07/31/2024   Patient Name: Jonathan Baird  DOB: 18-Nov-1932  MRN: [id number]  Age / Sex: 89 y.o., male  PCP: Verdia Lombard, MD Referring Physician: Dub Mancel HERO, MD  Reason for Consultation: Establishing goals of care  Past Medical History:  Diagnosis Date   Carotid artery stenosis    Right   CKD (chronic kidney disease), stage IV (HCC)    Colon cancer (HCC) 10/12/2004   stage III, s/p hemicolectomy   Dementia (HCC)    Diabetes mellitus    HLD (hyperlipidemia)    HTN (hypertension)    Peripheral vascular disease (HCC)     Subjective:   This NP Camellia Kays reviewed medical records, received report from team, assessed the patient and then meet at the patient's bedside to discuss diagnosis, prognosis, GOC, EOL wishes disposition and options.  Before meeting with the patient/family, I spent time reviewing the chart notes including admission H&P, ED provider notes, ED nursing notes from yesterday, RT note from yesterday, pulmonary disease note from yesterday, chaplain note from yesterday, PCCM note from today. I also reviewed vital signs, nursing flowsheets, medication administrations record, labs, and imaging. Labs reviewed include CBC which showed substantial leukocytosis with white blood cell count of 38.4, mild but stable anemia with hemoglobin 9.5 in the setting of sepsis.  Lactic acid greater than initially 8.9 and then increased to greater than 15 in the setting of sepsis and subsequent cardiac arrest status post CPR.  I met with the patient at bedside, no family is present.  I attempted to call the patient's wife and left a voicemail for call back.   We meet to discuss diagnosis prognosis, GOC, EOL wishes, disposition and options. Concept of Palliative Care was introduced as specialized medical care for people and their families living with serious  illness.  If focuses on providing relief from the symptoms and stress of a serious illness.  The goal is to improve quality of life for both the patient and the family. Values and goals of care important to patient and family were attempted to be elicited.  Created space and opportunity for patient  and family to explore thoughts and feelings regarding current medical situation   Natural trajectory and current clinical status were discussed. Questions and concerns addressed. Patient  encouraged to call with questions or concerns.    Patient/Family Understanding of Illness: Deferred  Life Review: Deferred  Today's Discussion: Prior to seeing the patient I spoke with the bedside nurse.  The patient remains critically ill, on antibiotics and pressors have been successfully weaned.  He had a cardiac arrest with ROSC after 6 minutes in the emergency department.  He has been bedbound for 2-1/2 months and has multiple wounds, likely source of sepsis.  Per conversations with patient's family yesterday they anticipate transition to comfort care today, although no family has been to the bedside as of yet.  PCCM is attempted to call family with no answer.  Today saw the patient at bedside, he is unable to meaningfully communicate.  He remains stable, does not appear to be in any distress or substantial discomfort.  I attempted to call the patient's wife and left a voicemail.  Shortly after that I received a message from PCCM who states that they spoke with the  patient's daughter and they are not ready to transition to comfort care just yet.  As of now the plan is to extubate shortly with no plan to reintubate.  Also placing limits on pressors and other aggressive interventions.  Dr. Shelah also recommended transition to comfort care if he fails extubation.  I offered that palliative medicine will continue to follow and would touch base with the patient and family tomorrow.  Goals: One-way extubation with no  intent to reintubate, recommend transition to comfort care if patient fails extubation.  PCCM to place limits on aggressive interventions including pressors.  Review of Systems  Unable to perform ROS   Objective:   Primary Diagnoses: Present on Admission:  Sepsis (HCC)   Vital Signs:  BP 127/65   Pulse 69   Temp 98.8 F (37.1 C)   Resp (!) 24   Ht 6' (1.829 m)   Wt 54.3 kg   SpO2 100%   BMI 16.24 kg/m   Physical Exam Vitals and nursing note reviewed.  Constitutional:      General: He is not in acute distress.    Appearance: He is ill-appearing.     Interventions: He is intubated.  HENT:     Head: Normocephalic and atraumatic.  Cardiovascular:     Rate and Rhythm: Normal rate.  Pulmonary:     Effort: Pulmonary effort is normal. No respiratory distress. He is intubated.  Abdominal:     General: Abdomen is flat.  Skin:    General: Skin is warm and dry.     Palliative Assessment/Data: 10%   Assessment & Plan:   HPI/Patient Profile: 89 y.o. male  with past medical history of HTN, dyslipidemia, dementia, PVD, DM-2, CKD-IV, right RCA stenosis, carotid stenosis on anticoagulation and ASA, colon cancer s/p hemicolectomy, bed bound since last admission May 22, 2024 (respiratory distress, managed conservatively SBO, aspiration pneumonia, witnessed seizure, AMS, and septic shock), anemia of chronic illness, heels and sacral bedsore, severe PCM, and dysphagia (modified diet), who was brought to ED tonight due to poor oral intake, AMS, and lethargy for 2 days. He is refusing to eat over the past 48 hours.  He was admitted on 07/30/2024 with sepsis due to infected sacral bedsore, possible aspiration pneumonia, VT cardiac arrest with ROSC after 6 minutes, lactic acidosis/AGMA due to sepsis and cardiac arrest, dementia, bedbound status for 2.5 months, poor functional status, and others.  Palliative medicine was consulted for GOC conversations.  SUMMARY OF RECOMMENDATIONS    DNR-Limited Anticipate one-way extubation later today Anticipate transition to comfort care if patient fails extubation Palliative medicine will follow-up tomorrow for ongoing support  Symptom Management:  Per primary team Palliative medicine is available to assist as needed especially if transition to comfort  Code Status: DNR - Limited (DNR/DNI)  Prognosis:  Unable to determine  Discharge Planning:  To Be Determined   Discussed with: Medical team, nursing team    Thank you for allowing us  to participate in the care of GUERINO CAPORALE PMT will continue to support holistically.  Billing based on MDM: Moderate  Detailed review of medical records (labs, imaging, vital signs), medically appropriate exam, discussed with treatment team, counseling and education to patient, family, & staff, documenting clinical information, medication management, coordination of care  Signed by: Camellia Kays, NP Palliative Medicine Team  Team Phone # [phone] (Nights/Weekends)  07/31/2024, 10:45 AM

## 2024-07-31 NOTE — Progress Notes (Signed)
 PHARMACY - PHYSICIAN COMMUNICATION CRITICAL VALUE ALERT - BLOOD CULTURE IDENTIFICATION (BCID)  Jonathan Baird is an 89 y.o. male who presented to [facility] on 07/30/2024 with a chief complaint of sepsis  Assessment:  2/4 blood cx + with proteus  Name of physician (or Provider) Contacted: Byrum, CCM  Current antibiotics: Vanc, Zosyn   Changes to prescribed antibiotics recommended:  Discussed with Dr. Shelah, stopping vancomycin  and continuing Zosyn .    Results for orders placed or performed during the hospital encounter of 07/30/24  Blood Culture ID Panel (Reflexed) (Collected: 07/30/2024  5:03 PM)  Result Value Ref Range   Enterococcus faecalis NOT DETECTED NOT DETECTED   Enterococcus Faecium NOT DETECTED NOT DETECTED   Listeria monocytogenes NOT DETECTED NOT DETECTED   Staphylococcus species NOT DETECTED NOT DETECTED   Staphylococcus aureus (BCID) NOT DETECTED NOT DETECTED   Staphylococcus epidermidis NOT DETECTED NOT DETECTED   Staphylococcus lugdunensis NOT DETECTED NOT DETECTED   Streptococcus species NOT DETECTED NOT DETECTED   Streptococcus agalactiae NOT DETECTED NOT DETECTED   Streptococcus pneumoniae NOT DETECTED NOT DETECTED   Streptococcus pyogenes NOT DETECTED NOT DETECTED   A.calcoaceticus-baumannii NOT DETECTED NOT DETECTED   Bacteroides fragilis NOT DETECTED NOT DETECTED   Enterobacterales DETECTED (A) NOT DETECTED   Enterobacter cloacae complex NOT DETECTED NOT DETECTED   Escherichia coli NOT DETECTED NOT DETECTED   Klebsiella aerogenes NOT DETECTED NOT DETECTED   Klebsiella oxytoca NOT DETECTED NOT DETECTED   Klebsiella pneumoniae NOT DETECTED NOT DETECTED   Proteus species DETECTED (A) NOT DETECTED   Salmonella species NOT DETECTED NOT DETECTED   Serratia marcescens NOT DETECTED NOT DETECTED   Haemophilus influenzae NOT DETECTED NOT DETECTED   Neisseria meningitidis NOT DETECTED NOT DETECTED   Pseudomonas aeruginosa NOT DETECTED NOT DETECTED    Stenotrophomonas maltophilia NOT DETECTED NOT DETECTED   Candida albicans NOT DETECTED NOT DETECTED   Candida auris NOT DETECTED NOT DETECTED   Candida glabrata NOT DETECTED NOT DETECTED   Candida krusei NOT DETECTED NOT DETECTED   Candida parapsilosis NOT DETECTED NOT DETECTED   Candida tropicalis NOT DETECTED NOT DETECTED   Cryptococcus neoformans/gattii NOT DETECTED NOT DETECTED   CTX-M ESBL NOT DETECTED NOT DETECTED   Carbapenem resistance IMP NOT DETECTED NOT DETECTED   Carbapenem resistance KPC NOT DETECTED NOT DETECTED   Carbapenem resistance NDM NOT DETECTED NOT DETECTED   Carbapenem resist OXA 48 LIKE NOT DETECTED NOT DETECTED   Carbapenem resistance VIM NOT DETECTED NOT DETECTED    Harlene Denna Berdine JONETTA, BCPS, BCCP Clinical Pharmacist  07/31/2024 5:30 PM   [facility] pharmacy phone numbers are listed on [email or web address]

## 2024-07-31 NOTE — Progress Notes (Signed)
 NAME:  Jonathan Baird, MRN:  [id number], DOB:  Feb 12, 1933, LOS: 1 ADMISSION DATE:  07/30/2024, CONSULTATION DATE:  9/ REFERRING MD: Darra Fonda MATSU, MD, CHIEF COMPLAINT:  Lethargy for 2 days   History of Present Illness:  A 89 year old male patient with HTN, dyslipidemia, dementia, PVD, DM-2, CKD-IV, right RCA stenosis, carotid stenosis on anticoagulation and ASA, colon cancer s/p hemicolectomy, bed bound since last admission May 22, 2024 (respiratory distress, managed conservatively SBO, aspiration pneumonia, witnessed seizure, AMS, and septic shock), anemia of chronic illness, heels and sacral bedsore, severe PCM, and dysphagia (modified diet), who was brought to ED tonight due to poor oral intake, AMS, and lethargy for 2 days. He is refusing to eat over the past 48 hours. He does have noted wounds to his sacrum (getting worse) as well as bilateral feet. While he was in ED, he developed ventricular tachycardia although maintains a pulse during these episodes. Shortly, he lost pulses and required CPR. He received 1 mg epinephrine  along with amiodarone  150 mg bolus and ROSC was achieved after 6 min.  Patient was intubated with ETT, on PRVC 24/590/+5/50%. Pertinent  Medical History  HTN, dyslipidemia, dementia, PVD, DM-2, CKD-IV, right RCA stenosis, colon cancer s/p hemicolectomy, anemia of chronic illness, heels and sacral bedsore, severe PCM, dysphagia (modified diet), SBO (managed conservatively), carotid stenosis on anticoagulation and ASA.   Significant Hospital Events: Including procedures, antibiotic start and stop dates in addition to other pertinent events   9/18: ED, 2.25 L boluses, ACLS, VT, Amiodarone , Epi, Zosyn , Vanco  PCCM was consulted for admission    Interim History / Subjective:  0.40+ PEEP 5 Norepinephrine  has been weaned to off Fentanyl  25   Objective    Blood pressure 104/63, pulse 74, temperature 98.4 F (36.9 C), resp. rate (!) 24, height 6' (1.829 m), weight 54.3 kg,  SpO2 100%.    Vent Mode: PRVC FiO2 (%):  [40 %-100 %] 40 % Set Rate:  [24 bmp] 24 bmp Vt Set:  [590 mL] 590 mL PEEP:  [5 cmH20] 5 cmH20 Plateau Pressure:  [18 cmH20] 18 cmH20   Intake/Output Summary (Last 24 hours) at 07/31/2024 9278 Last data filed at 07/31/2024 0530 Gross per 24 hour  Intake 4617.98 ml  Output 65 ml  Net 4552.98 ml   Filed Weights   07/31/24 0125  Weight: 54.3 kg    Examination: General: Ill-appearing elderly male laying in bed intubated HENT: ET tube in good position, no secretions, Lungs: Clear bilaterally, no crackles Cardiovascular: Distant, no murmur Abdomen: Nondistended, hypoactive bowel sounds Extremities: Chronic skin changes and deformities on bilateral toes, no edema Neuro: Startles and turns head to voice, withdraws from pain, does not follow commands  Resolved problem list   Assessment and Plan  Sepsis due to infected sacral bedsore and probable aspiration PNA -Empiric broad-spectrum antibiotics as ordered - Follow culture data and tailor appropriately - Follow lactate for clearance - Pressors have been weaned effectively  VT cardiac arrest. ROSC after 6 min  HTN, dyslipidemia, PVD, CAD, carotid stenosis on anticoagulation and ASA -Aspirin , anticoagulation currently on hold  Lactic acidosis/AGMA: due to sepsis and cardiac arrest  -IV fluid resuscitation, supportive care.  Follow lactate for clearance  Dementia Bed bound status for 2.5 months Poor functional status -Unfortunately will impact both acute and chronic prognosis.  DM-2 (HbA1c 7.6%) -Following CBG, no insulin  currently  Acute on CKD-IV -Given his overall prognosis, underlying dementia poor candidate for HD.  Would not consider  Anemia of chronic  illness, ?Fe def -Follow intermittent CBC if family desires continued care  Heels and sacral bedsore: unstageable -WOC consultation  Severe PCM, dysphagia (on modified diet) -N.p.o. currently  DNR/DNI - Dr. Dub  has d/w daughter and wife about his poor clinical condition, co morbidities, poor prognosis, and poor functional status. They agreed to DNR/DNI and transition to comfort care 9/19 - Consultation to palliative care placed 9/18  Labs   CBC: Recent Labs  Lab 07/30/24 1704 07/30/24 1714 07/30/24 1716 07/30/24 1815  WBC 38.4*  --   --   --   NEUTROABS 34.9*  --   --   --   HGB 9.5* 10.2* 10.5* 8.2*  HCT 29.5* 30.0* 31.0* 24.0*  MCV 79.1*  --   --   --   PLT 417*  --   --   --     Basic Metabolic Panel: Recent Labs  Lab 07/30/24 1704 07/30/24 1714 07/30/24 1716 07/30/24 1815  NA 137 138 137 137  K 5.0 4.9 4.9 4.4  CL 102 108  --   --   CO2 13*  --   --   --   GLUCOSE 397* 372*  --   --   BUN 97* 100*  --   --   CREATININE 4.63* 5.00*  --   --   CALCIUM  9.2  --   --   --   MG 2.8*  --   --   --    GFR: Estimated Creatinine Clearance: 7.8 mL/min (A) (by C-G formula based on SCr of 5 mg/dL (H)). Recent Labs  Lab 07/30/24 1704 07/30/24 1715 07/30/24 1857  WBC 38.4*  --   --   LATICACIDVEN  --  8.9* >15.0*    Liver Function Tests: Recent Labs  Lab 07/30/24 1704  AST 51*  ALT 40  ALKPHOS 106  BILITOT 0.8  PROT 7.8  ALBUMIN 1.8*   No results for input(s): LIPASE, AMYLASE in the last 168 hours. No results for input(s): AMMONIA in the last 168 hours.  ABG    Component Value Date/Time   PHART 7.194 (LL) 07/30/2024 1815   PCO2ART 32.0 07/30/2024 1815   PO2ART 372 (H) 07/30/2024 1815   HCO3 12.5 (L) 07/30/2024 1815   TCO2 14 (L) 07/30/2024 1815   ACIDBASEDEF 15.0 (H) 07/30/2024 1815   O2SAT 100 07/30/2024 1815     Coagulation Profile: Recent Labs  Lab 07/30/24 1704  INR 1.3*    Cardiac Enzymes: Recent Labs  Lab 07/30/24 1704  CKTOTAL 325    HbA1C: Hgb A1c MFr Bld  Date/Time Value Ref Range Status  05/21/2024 04:44 AM 7.6 (H) 4.8 - 5.6 % Final    Comment:    (NOTE)         Prediabetes: 5.7 - 6.4         Diabetes: >6.4         Glycemic  control for adults with diabetes: <7.0   10/20/2023 01:25 AM 8.2 (H) 4.8 - 5.6 % Final    Comment:    (NOTE)         Prediabetes: 5.7 - 6.4         Diabetes: >6.4         Glycemic control for adults with diabetes: <7.0     CBG: Recent Labs  Lab 07/30/24 2322 07/31/24 0340  GLUCAP 376* 211*     Critical care time:  31 minutes    Lamar Chris, MD, PhD 07/31/2024, 7:21 AM [place]  [facility] [phone] or if no answer before 7:00PM call [phone] For any issues after 7:00PM please call eLink [phone]

## 2024-07-31 NOTE — Care Management (Signed)
 Transition of Care [facility]) - Inpatient Brief Assessment   Patient Details  Name: Jonathan Baird MRN: [id number] Date of Birth: 1933-10-25  [facility] CM/SW Contact:    Corean JAYSON Canary, RN Phone Number: 07/31/2024, 2:35 PM   Clinical Narrative: Patient from home with spouse for sepsis had VT arrest with ROSC in 6 minutes, currently intubated, physician discussed with family, transition to comfort care is planned.   Transition of Care Asessment: Insurance and Status: Insurance coverage has been reviewed Patient has primary care physician: Yes Home environment has been reviewed: Lives with spouse Prior level of function:: With assist Prior/Current Home Services: No current home services Social Drivers of Health Review: SDOH reviewed no interventions necessary (Unable to answer) Readmission risk has been reviewed: Yes Transition of care needs: no transition of care needs at this time

## 2024-07-31 NOTE — Consult Note (Signed)
 WOC Nurse Consult Note: Reason for Consult: sacral and heel wounds  Wound type: Right trochanter: Deep Tissue Pressure Injury; dark purple blood filed blister; apply xeroform and foam, change every other day Left heel: Unstageable; 100% dry eschar; paint with betadine daily, allow to air dry, offload at all times.  Right heel: Unstageable; 100% dry eschar; paint with betadine daily; allow to air dry, offload at all times  Sacrum: Unstageable: 95% black/yellow eschar/5% pink at distal aspect; conservative debridement until GOC addressed  Right Gluteal cleft: Stage 3; full thickness; 100% pink; protect and insulate with foam Left Gluteal cleft: Stage 3; full thickness; 100% pink, protect and insulate with foam  Pressure Injury POA: Yes x 6 Measurement:see nursing flow sheets  Wound bed:see above Drainage (amount, consistency, odor) see nursing flow sheets Periwound:intact  Dressing procedure/placement/frequency: LALM for moisture management and pressure redistribution Consult RD for wound supplementation Paint right and left heel wounds with Betadine daily, allow to air dry.  Offload heels with Prevalon boots at all times Right trochanter; apply single layer of xeroform, top with foam. Change every other day  Upper left and right gluteal cleft; protect and insulate with foam, change every other day  Sacrum, cleanse with saline, pat dry. Apply medihoney to dry gauze and place over all of the wound bed, top with dry dressing. Change daily   Consider Palliative care If aggressive treatment desired consult general surgery for sacral wound   Re consult if needed, will not follow at this time. Thanks  Ernestene Coover [employer], RN,CWOCN, CNS, [employer] [phone]

## 2024-07-31 NOTE — Plan of Care (Signed)
  Problem: Pain Managment: Goal: General experience of comfort will improve and/or be controlled Outcome: Progressing

## 2024-07-31 NOTE — Progress Notes (Signed)
 eLink Physician-Brief Progress Note Patient Name: Jonathan Baird DOB: Dec 10, 1932 MRN: [id number]   Date of Service  07/31/2024  HPI/Events of Note  Notified of potassium of 6, Cr 5.23  eICU Interventions  Placed order for insulin +dextrose , lokelma , and sodium bicarbonate  per hyperkalemia protocol.         Chelcey Caputo M DELA CRUZ 07/31/2024, 9:59 PM

## 2024-07-31 NOTE — Progress Notes (Signed)
 Pharmacy Antibiotic Note  Jonathan Baird is a 89 y.o. male admitted on 07/30/2024 with sepsis.  Pharmacy has been consulted for vancomycin  and Zosyn  dosing.  Plan: Scr rising- no plans for HD or procedures noted Vancomycin  1000 x 1 9/18 @ 1849 - will check random level ~ 9/21?  To assess need for re-dosing Watch Scr/UOP Zosyn  given ED 9/18 @1818  - then 2.25g q 8 hrs   Height: 6' (182.9 cm) Weight: 54.3 kg (119 lb 11.4 oz) IBW/kg (Calculated) : 77.6  Temp (24hrs), Avg:97.9 F (36.6 C), Min:96.3 F (35.7 C), Max:99 F (37.2 C)  Recent Labs  Lab 07/30/24 1704 07/30/24 1714 07/30/24 1715 07/30/24 1857  WBC 38.4*  --   --   --   CREATININE 4.63* 5.00*  --   --   LATICACIDVEN  --   --  8.9* >15.0*    Estimated Creatinine Clearance: 7.8 mL/min (A) (by C-G formula based on SCr of 5 mg/dL (H)).    No Known Allergies  Thank you for allowing pharmacy to be a part of this patient's care.  Harlene Barlow, [employer], BCCP Clinical Pharmacist  07/31/2024 3:22 PM   [facility] pharmacy phone numbers are listed on [email or web address]

## 2024-08-01 DIAGNOSIS — Z7189 Other specified counseling: Secondary | ICD-10-CM | POA: Diagnosis not present

## 2024-08-01 DIAGNOSIS — L89159 Pressure ulcer of sacral region, unspecified stage: Secondary | ICD-10-CM | POA: Diagnosis not present

## 2024-08-01 DIAGNOSIS — Z515 Encounter for palliative care: Secondary | ICD-10-CM | POA: Diagnosis not present

## 2024-08-01 DIAGNOSIS — J9600 Acute respiratory failure, unspecified whether with hypoxia or hypercapnia: Secondary | ICD-10-CM

## 2024-08-01 DIAGNOSIS — R6521 Severe sepsis with septic shock: Secondary | ICD-10-CM

## 2024-08-01 DIAGNOSIS — B964 Proteus (mirabilis) (morganii) as the cause of diseases classified elsewhere: Secondary | ICD-10-CM

## 2024-08-01 DIAGNOSIS — Z66 Do not resuscitate: Secondary | ICD-10-CM | POA: Diagnosis not present

## 2024-08-01 DIAGNOSIS — N179 Acute kidney failure, unspecified: Secondary | ICD-10-CM | POA: Diagnosis not present

## 2024-08-01 DIAGNOSIS — A419 Sepsis, unspecified organism: Secondary | ICD-10-CM | POA: Diagnosis not present

## 2024-08-01 LAB — BASIC METABOLIC PANEL WITH GFR
Anion gap: 20 — ABNORMAL HIGH (ref 5–15)
BUN: 107 mg/dL — ABNORMAL HIGH (ref 8–23)
CO2: 18 mmol/L — ABNORMAL LOW (ref 22–32)
Calcium: 8.4 mg/dL — ABNORMAL LOW (ref 8.9–10.3)
Chloride: 103 mmol/L (ref 98–111)
Creatinine, Ser: 5.24 mg/dL — ABNORMAL HIGH (ref 0.61–1.24)
GFR, Estimated: 10 mL/min — ABNORMAL LOW (ref >60)
Glucose, Bld: 265 mg/dL — ABNORMAL HIGH (ref 70–99)
Potassium: 5.3 mmol/L — ABNORMAL HIGH (ref 3.5–5.1)
Sodium: 141 mmol/L (ref 135–145)

## 2024-08-01 LAB — GLUCOSE, CAPILLARY
Glucose-Capillary: 239 mg/dL — ABNORMAL HIGH (ref 70–99)
Glucose-Capillary: 252 mg/dL — ABNORMAL HIGH (ref 70–99)
Glucose-Capillary: 243 mg/dL — ABNORMAL HIGH (ref 70–99)
Glucose-Capillary: 262 mg/dL — ABNORMAL HIGH (ref 70–99)
Glucose-Capillary: 256 mg/dL — ABNORMAL HIGH (ref 70–99)
Glucose-Capillary: 286 mg/dL — ABNORMAL HIGH (ref 70–99)

## 2024-08-01 LAB — CBC
HCT: 21.2 % — ABNORMAL LOW (ref 39.0–52.0)
Hemoglobin: 7.3 g/dL — ABNORMAL LOW (ref 13.0–17.0)
MCH: 25.5 pg — ABNORMAL LOW (ref 26.0–34.0)
MCHC: 34.4 g/dL (ref 30.0–36.0)
MCV: 74.1 fL — ABNORMAL LOW (ref 80.0–100.0)
Platelets: 225 K/uL (ref 150–400)
RBC: 2.86 MIL/uL — ABNORMAL LOW (ref 4.22–5.81)
RDW: 15.4 % (ref 11.5–15.5)
WBC: 23.7 K/uL — ABNORMAL HIGH (ref 4.0–10.5)
nRBC: 0 % (ref 0.0–0.2)

## 2024-08-01 LAB — LACTIC ACID, PLASMA: Lactic Acid, Venous: 2.6 mmol/L (ref 0.5–1.9)

## 2024-08-01 LAB — PHOSPHORUS: Phosphorus: 5 mg/dL — ABNORMAL HIGH (ref 2.5–4.6)

## 2024-08-01 LAB — MAGNESIUM: Magnesium: 2.3 mg/dL (ref 1.7–2.4)

## 2024-08-01 MED ORDER — PROSOURCE TF20 ENFIT COMPATIBL EN LIQD
60.0000 mL | Freq: Every day | ENTERAL | Status: DC
  Administered 2024-08-01 – 2024-08-02 (×2): 60 mL
  Filled 2024-08-01 (×2): qty 60

## 2024-08-01 MED ORDER — NEPRO/CARBSTEADY PO LIQD: 1000.0000 mL | ORAL | Status: DC

## 2024-08-01 MED ORDER — SODIUM CHLORIDE 0.9 % IV SOLN
2.0000 g | INTRAVENOUS | Status: DC
  Administered 2024-08-01: 2 g via INTRAVENOUS
  Filled 2024-08-01: qty 20

## 2024-08-01 MED ORDER — INSULIN ASPART 100 UNIT/ML IJ SOLN
0.0000 [IU] | INTRAMUSCULAR | Status: DC
  Administered 2024-08-01: 3 [IU] via SUBCUTANEOUS
  Administered 2024-08-01: 5 [IU] via SUBCUTANEOUS
  Administered 2024-08-02 (×3): 3 [IU] via SUBCUTANEOUS
  Administered 2024-08-02: 5 [IU] via SUBCUTANEOUS

## 2024-08-01 MED ORDER — VITAL 1.5 CAL PO LIQD
1000.0000 mL | ORAL | Status: DC
  Administered 2024-08-01 – 2024-08-02 (×2): 1000 mL

## 2024-08-01 MED ORDER — THIAMINE MONONITRATE 100 MG PO TABS
100.0000 mg | ORAL_TABLET | Freq: Every day | ORAL | Status: DC
  Administered 2024-08-01 – 2024-08-02 (×2): 100 mg
  Filled 2024-08-01 (×2): qty 1

## 2024-08-01 NOTE — Progress Notes (Signed)
 Daily Progress Note   Date: 08/01/2024   Patient Name: Jonathan Baird  DOB: 04/26/1933  MRN: [id number]  Age / Sex: 89 y.o., male  Attending Physician: Shelah Lamar RAMAN, MD Primary Care Physician: Verdia Lombard, MD Admit Date: 07/30/2024 Length of Stay: 2 days  Reason for Follow-up: Establishing goals of care  Past Medical History:  Diagnosis Date   Carotid artery stenosis    Right   CKD (chronic kidney disease), stage IV (HCC)    Colon cancer (HCC) 10/12/2004   stage III, s/p hemicolectomy   Dementia (HCC)    Diabetes mellitus    HLD (hyperlipidemia)    HTN (hypertension)    Peripheral vascular disease (HCC)     Subjective:   Subjective: Chart Reviewed. Updates received. Patient Assessed. Created space and opportunity for patient  and family to explore thoughts and feelings regarding current medical situation.  Today's Discussion: Today before meeting with the patient/family, I reviewed the chart notes including nursing note from yesterday, PCCM IPAL note from yesterday, PCCM note from yesterday, PCCM note from today, dietitian note from today. I also reviewed vital signs, nursing flowsheets, medication administrations record, labs, and imaging. Labs reviewed include BMP shows hyperkalemia at 5.3 but slightly improved from 6.3 yesterday, progressive AKI at 5.24 today in the setting of cardiac arrest, hypoxia, sepsis and severe illness.  Lactic acid remains elevated at 2.6 today though substantially improved from greater than 15.0 yesterday likely subsequent from cardiac arrest and sepsis.  CBC today with significant improvement in leukocytosis of 23.7 compared to 38.4 yesterday in the setting of sepsis.  Prior to seeing the patient and family I spoke with the registered nurse.  Got an update on progressive kidney failure, not doing well.  I also spoke with PCCM.  Family changed the plan from transition to comfort care yesterday to make attempts at extubation to be able to get  home with hospice.  Today I saw the patient at bedside, no family was present.  I requested the nurse to notify me if patient family arrives.  Otherwise I would attempt to make contact via telephone.  Later in the day I received a message from the nurse that the patient's family was at the bedside.  I returned to the bedside and found the patient's wife and daughter at the bedside.  We spent time confirming that they understand that he is unlikely to survive this event.  They confirmed their goals to be able to get him home with hospice care in the home for peaceful passing at home.  I spent time talking about hospice care.  I described hospice as a service for patients who have a life expectancy of 6 months or less. The goal of hospice is the preservation of dignity and quality at the end phases of life. Under hospice care, the focus changes from curative to symptom relief. I explained the three setting where hospice services can be provided including the home, at a living facility (such as LTC SNF, Assisted Living, etc), and a hospice facility. I explained that acceptance to hospice in any specific location is the final decision of the hospice medical director and bed availability, if applicable. They verbalized understanding.  At the end of the conversation he confirmed he understand hospice quite well this patient was previously on hospice for a week or 2 prior to resending for ongoing therapy.  Patient's daughter confirms that she was providing 24-hour care for her father at home previously and would be willing  and able to do as such with hospice support.  We spent time talking about removing the breathing tube.  I expressed my sincere concerns that the patient may not do well after extubation.  We talked about fully supporting the goal to try to get home with hospice.  However, if he struggles after extubation we would not replace a breathing tube and would not make significant escalation in care but  rather transition to comfort care in the hospital in which point he would likely pass here.  They understand this.  We talked about selecting a hospice agency and I indicated I would consult TOC and social work to attempt in providing choice.  We talked about various hospice agencies in the area.  I also spent time talking about if after they get home and it becomes too difficult to provide care for him that they could inquire about transition to the hospice home for 24-hour care at their facility.  They understand this.  I shared that I would consult with PCCM and TOC to try to make these plans reality.  She would have PCCM would likely be by to talk about extubation process and timing.  I shared that 73 from palliative medicine will continue to follow along and I will be back on Tuesday. I provided emotional and general support through therapeutic listening, empathy, sharing of stories, and other techniques. I answered all questions and addressed all concerns to the best of my ability.  Review of Systems  Unable to perform ROS   Objective:   Primary Diagnoses: Present on Admission:  Sepsis (HCC)   Vital Signs:  BP (!) 113/56   Pulse (!) 54   Temp (!) 97.3 F (36.3 C)   Resp (!) 24   Ht 6' (1.829 m)   Wt 55 kg   SpO2 99%   BMI 16.44 kg/m   Physical Exam Vitals and nursing note reviewed.  Constitutional:      General: He is not in acute distress.    Appearance: He is ill-appearing.     Interventions: He is intubated.  HENT:     Head: Normocephalic and atraumatic.  Cardiovascular:     Rate and Rhythm: Bradycardia present.  Pulmonary:     Effort: Pulmonary effort is normal. No respiratory distress. He is intubated.  Abdominal:     General: Abdomen is flat.  Skin:    General: Skin is warm and dry.  Neurological:     Mental Status: He is unresponsive.   On fentanyl  drip  Palliative Assessment/Data: 10%   Advanced Care Planning:   Existing Vynca/ACP  Documentation: None  Primary Decision Maker: NEXT OF KIN  Pertinent diagnosis: Cardiac arrest, respiratory failure requiring intubation, sepsis  The patient and/or family consented to a voluntary Advance Care Planning Conversation in person. Individuals present for the conversation: Patient's daughter Jonathan Baird, spouse Jonathan Camellia Kays, NP  Summary of the conversation: We discussed the severity of the clinical situation, options moving forward, and goals.  Goal is to be able to extubate and be stable to discharge home with hospice care in the home.  They understand that if he does not tolerate extubation well then he would be made comfort care in the hospital and likely pass here.  Outcome of the conversations and/or documents completed: Attempt extubation, timing per PCCM.  Make sincere attempts to discharge home with hospice to pass in the home.  They understand comfort care and passing in the hospital may be the reality.  I  spent 20 minutes providing separately identifiable ACP services with the patient and/or surrogate decision maker in a voluntary, in-person conversation discussing the patient's wishes and goals as detailed in the above note.  Assessment & Plan:   HPI/Patient Profile:  89 y.o. male  with past medical history of HTN, dyslipidemia, dementia, PVD, DM-2, CKD-IV, right RCA stenosis, carotid stenosis on anticoagulation and ASA, colon cancer s/p hemicolectomy, bed bound since last admission May 22, 2024 (respiratory distress, managed conservatively SBO, aspiration pneumonia, witnessed seizure, AMS, and septic shock), anemia of chronic illness, heels and sacral bedsore, severe PCM, and dysphagia (modified diet), who was brought to ED tonight due to poor oral intake, AMS, and lethargy for 2 days. He is refusing to eat over the past 48 hours.  He was admitted on 07/30/2024 with sepsis due to infected sacral bedsore, possible aspiration pneumonia, VT cardiac arrest with ROSC after 6 minutes,  lactic acidosis/AGMA due to sepsis and cardiac arrest, dementia, bedbound status for 2.5 months, poor functional status, and others.   Palliative medicine was consulted for GOC conversations.  SUMMARY OF RECOMMENDATIONS   DNR-Limited Anticipate one-way extubation, timing per PCCM TOC consult placed and arranging for hospice at home Patient's family understands if extubation is not tolerated would need to be comfort care and pass in the hospital Will make all attempts to get the patient home with hospice otherwise Ongoing supportive patient and family Palliative medicine will continue to follow  Symptom Management:  Per primary team Palliative medicine is available to assist as needed  Code Status: DNR - Limited (DNR/DNI)  Prognosis: Hours - Days  Discharge Planning: Home with hospice versus in-hospital death pending results of extubation  Discussed with: Patient's family, medical team, nursing team  Thank you for allowing us  to participate in the care of BENEDICTO CAPOZZI PMT will continue to support holistically.  Billing based on MDM: High  Problems Addressed: One acute or chronic illness or injury that poses a threat to life or bodily function  Amount and/or Complexity of Data: Category 1:Review of prior external note(s) from each unique source, Review of the result(s) of each unique test, and Assessment requiring an independent historian(s) and Category 3:Discussion of management or test interpretation with external physician/other qualified health care professional/appropriate source (not separately reported)  Risks: Decision not to resuscitate or to de-escalate care because of poor prognosis  Detailed review of medical records (labs, imaging, vital signs), medically appropriate exam, discussed with treatment team, counseling and education to patient, family, & staff, documenting clinical information, medication management, coordination of care  Camellia Kays, NP Palliative  Medicine Team  Team Phone # [phone] (Nights/Weekends)  07/11/2021, 8:17 AM

## 2024-08-01 NOTE — Progress Notes (Signed)
 NAME:  Jonathan Baird, MRN:  [id number], DOB:  03-Dec-1932, LOS: 2 ADMISSION DATE:  07/30/2024, CONSULTATION DATE:  9/ REFERRING MD: Darra Fonda MATSU, MD, CHIEF COMPLAINT:  Lethargy for 2 days   History of Present Illness:  A 89 year old male patient with HTN, dyslipidemia, dementia, PVD, DM-2, CKD-IV, right RCA stenosis, carotid stenosis on anticoagulation and ASA, colon cancer s/p hemicolectomy, bed bound since last admission May 22, 2024 (respiratory distress, managed conservatively SBO, aspiration pneumonia, witnessed seizure, AMS, and septic shock), anemia of chronic illness, heels and sacral bedsore, severe PCM, and dysphagia (modified diet), who was brought to ED tonight due to poor oral intake, AMS, and lethargy for 2 days. He is refusing to eat over the past 48 hours. He does have noted wounds to his sacrum (getting worse) as well as bilateral feet. While he was in ED, he developed ventricular tachycardia although maintains a pulse during these episodes. Shortly, he lost pulses and required CPR. He received 1 mg epinephrine  along with amiodarone  150 mg bolus and ROSC was achieved after 6 min.  Patient was intubated with ETT, on PRVC 24/590/+5/50%. Pertinent  Medical History  HTN, dyslipidemia, dementia, PVD, DM-2, CKD-IV, right RCA stenosis, colon cancer s/p hemicolectomy, anemia of chronic illness, heels and sacral bedsore, severe PCM, dysphagia (modified diet), SBO (managed conservatively), carotid stenosis on anticoagulation and ASA.   Significant Hospital Events: Including procedures, antibiotic start and stop dates in addition to other pertinent events   9/18: ED, 2.25 L boluses, ACLS, VT, Amiodarone , Epi, Zosyn , Vanco  PCCM was consulted for admission   9/18 urine culture >> GNR >> 9/18 blood cultures >> GNR (BCID = Proteus) >>   Interim History / Subjective:  Norepinephrine  1 I/O+ 5.3 L total, UOP 40 cc / 24 hours Fentanyl  25 > 50 SCr 5.0 > 5.23 > 5.24 K+ 5.3 0.40+ PEEP  5  Objective    Blood pressure (!) 99/57, pulse (!) 56, temperature 97.9 F (36.6 C), resp. rate (!) 24, height 6' (1.829 m), weight 55 kg, SpO2 100%.    Vent Mode: PRVC FiO2 (%):  [4 %-40 %] 40 % Set Rate:  [24 bmp] 24 bmp Vt Set:  [580 mL-590 mL] 590 mL PEEP:  [5 cmH20] 5 cmH20 Plateau Pressure:  [16 cmH20-20 cmH20] 16 cmH20   Intake/Output Summary (Last 24 hours) at 08/01/2024 0909 Last data filed at 08/01/2024 0600 Gross per 24 hour  Intake 459.29 ml  Output 40 ml  Net 419.29 ml   Filed Weights   07/31/24 0125 08/01/24 0351  Weight: 54.3 kg 55 kg    Examination: General: Thin ill-appearing man laying in bed in no distress HENT: ET tube in good position, no secretions Lungs: Bilaterally Cardiovascular: Distant without murmur Abdomen: Nondistended with hypoactive bowel sounds Extremities: No edema, skin changes bilateral feet Skin: Deep rounded sacral wound, malodorous Neuro: Grimace with stimulation and pain, startles and turns head to voice but does not track, does not follow commands  Resolved problem list   Assessment and Plan  Septic shock  Sacral decubitus ulcer, likely a contributing source and  Probable aspiration PNA GNR UTI GNR bacteremia, BCID suggests sensitive Proteus -Follow cultures to completion - Continue Zosyn  for now pending final blood and urine culture data - DC vancomycin  9/20 - Lactic acidosis cleared - Wean norepinephrine  as able.  Do not plan to escalate beyond 10, no plans for CVC placement  VT cardiac arrest. ROSC after 6 min  HTN, dyslipidemia, PVD, CAD, carotid stenosis  on anticoagulation and ASA -Anticoagulation and aspirin  currently on hold  Lactic acidosis/AGMA: due to sepsis and cardiac arrest  -Improved with resuscitation  Dementia Bed bound status for 2.5 months Poor functional status -Unfortunately will impact both acute and chronic prognosis.  DM-2 (HbA1c 7.6%) - Following CBG, at goal, no insulin  currently  Acute  on CKD-IV, oliguric/anuric -Given his overall prognosis, underlying dementia poor candidate for HD.  No plans to recommend - Continue to follow BMP, urine output  Anemia of chronic illness, ?Fe def -Follow intermittent CBC  Heels and sacral bedsore: unstageable -Appreciate WOC assistance  Severe PCM, dysphagia (on modified diet) -N.p.o.  Nutrition - Okay to start TF on 9/20  DNR/DNI - Dr. Dub has d/w daughter and wife 9/18 about his poor clinical condition, co morbidities, poor prognosis, and poor functional status. They agreed to DNR/DNI -Discussion 9/19 with patient's daughter by phone > they want to continue to support medically, continue this level support to see if he stabilizes.  The goal would be a successful extubation, possibly transition to hospice care with home hospice.  Unclear how realistic home care would be, given his anticipated care needs - Consultation to palliative care placed 9/18  Labs   CBC: Recent Labs  Lab 07/30/24 1704 07/30/24 1714 07/30/24 1716 07/30/24 1815  WBC 38.4*  --   --   --   NEUTROABS 34.9*  --   --   --   HGB 9.5* 10.2* 10.5* 8.2*  HCT 29.5* 30.0* 31.0* 24.0*  MCV 79.1*  --   --   --   PLT 417*  --   --   --     Basic Metabolic Panel: Recent Labs  Lab 07/30/24 1704 07/30/24 1714 07/30/24 1716 07/30/24 1815 07/31/24 1945 08/01/24 0252  NA 137 138 137 137 139 141  K 5.0 4.9 4.9 4.4 6.0* 5.3*  CL 102 108  --   --  104 103  CO2 13*  --   --   --  15* 18*  GLUCOSE 397* 372*  --   --  201* 265*  BUN 97* 100*  --   --  103* 107*  CREATININE 4.63* 5.00*  --   --  5.23* 5.24*  CALCIUM  9.2  --   --   --  8.6* 8.4*  MG 2.8*  --   --   --   --  2.3  PHOS  --   --   --   --   --  5.0*   GFR: Estimated Creatinine Clearance: 7.6 mL/min (A) (by C-G formula based on SCr of 5.24 mg/dL (H)). Recent Labs  Lab 07/30/24 1704 07/30/24 1715 07/30/24 1857 08/01/24 0252  WBC 38.4*  --   --   --   LATICACIDVEN  --  8.9* >15.0* 2.6*     Liver Function Tests: Recent Labs  Lab 07/30/24 1704  AST 51*  ALT 40  ALKPHOS 106  BILITOT 0.8  PROT 7.8  ALBUMIN 1.8*   No results for input(s): LIPASE, AMYLASE in the last 168 hours. No results for input(s): AMMONIA in the last 168 hours.  ABG    Component Value Date/Time   PHART 7.194 (LL) 07/30/2024 1815   PCO2ART 32.0 07/30/2024 1815   PO2ART 372 (H) 07/30/2024 1815   HCO3 12.5 (L) 07/30/2024 1815   TCO2 14 (L) 07/30/2024 1815   ACIDBASEDEF 15.0 (H) 07/30/2024 1815   O2SAT 100 07/30/2024 1815     Coagulation Profile: Recent Labs  Lab 07/30/24 1704  INR 1.3*    Cardiac Enzymes: Recent Labs  Lab 07/30/24 1704  CKTOTAL 325    HbA1C: Hgb A1c MFr Bld  Date/Time Value Ref Range Status  05/21/2024 04:44 AM 7.6 (H) 4.8 - 5.6 % Final    Comment:    (NOTE)         Prediabetes: 5.7 - 6.4         Diabetes: >6.4         Glycemic control for adults with diabetes: <7.0   10/20/2023 01:25 AM 8.2 (H) 4.8 - 5.6 % Final    Comment:    (NOTE)         Prediabetes: 5.7 - 6.4         Diabetes: >6.4         Glycemic control for adults with diabetes: <7.0     CBG: Recent Labs  Lab 07/31/24 2211 07/31/24 2342 08/01/24 0335 08/01/24 0341 08/01/24 0756  GLUCAP 231* 280* 239* 252* 243*     Critical care time:  32 minutes    Lamar Chris, MD, PhD 08/01/2024, 9:09 AM [facility] [phone] or if no answer before 7:00PM call [phone] For any issues after 7:00PM please call eLink [phone]

## 2024-08-01 NOTE — Progress Notes (Signed)
 [facility] LIAISON NOTE   Received request from Marval Gell, RN, Transitions of Care (TOC), for hospice services at home after discharge. Spoke with Jonathan Baird, patient's daughter,  to initiate education related to hospice philosophy, services, and team approach to care.  Jonathan states she is familiar with hospice services as her father was under hospice a couple of weeks with our agency, but decided to pursue home health for more aggressive rehab in the home.   Jonathan verbalizes that she and her mom are on board and want to get patient back on hospice with a comfort approach to care.  Per [facility], the plan is for Jonathan Baird to be extubated tomorrow morning.  Jonathan tells me she, her mom and sister plan on coming to the hospital around 11am.  We will see how patient does once extubated and then make arrangements for oxygen delivery- when family is able to be present or arrange someone to be present for delivery.    DME needs discussed.  Patient has the following equipment in the home:  hospital bed (through [employer]) Patient/family requests the following equipment for delivery:  oxygen            The address has been verified and is correct in the chart. Brandy Kabat, daughter, is the family contact to arrange time of equipment delivery.   Please provide prescriptions at discharge as needed to ensure ongoing symptom management. [facility] information and contact numbers given to [place].  Above information shared with Marval Gell, [facility] and hospital medical care team.  Please call with any hospice related questions or concerns. Thank you for the opportunity to participate in this patient's care.   [name], MA, BSN, RN, [facility] Nurse Liaison [phone]

## 2024-08-01 NOTE — Progress Notes (Addendum)
 Initial Nutrition Assessment  DOCUMENTATION CODES:   Underweight  INTERVENTION:   Initiate tube feeding via OG tube: Vital 1.5 at 20 ml/h, increase by 10 ml every 12 hours to goal rate of 45 ml/h (1080 ml per day) Prosource TF20 60 ml once daily Provides 1700 kcal, 93 gm protein, 825 ml free water  daily  Monitor magnesium , potassium, and phosphorus daily for at least 3 days, MD to replete as needed, as pt is at risk for refeeding syndrome given severe weight loss, suspected severe malnutrition. May not see lab values drop below normal as quickly due to K & Phos at higher serum levels with renal failure.  Add Thiamine  100 mg daily for 7 days.  NUTRITION DIAGNOSIS:   Increased nutrient needs related to wound healing, acute illness as evidenced by estimated needs.  GOAL:   Patient will meet greater than or equal to 90% of their needs  MONITOR:   Vent status, TF tolerance  REASON FOR ASSESSMENT:   Consult Enteral/tube feeding initiation and management, Wound healing  ASSESSMENT:   89 yo male admitted with sepsis, infected sacral wound, possible aspiration PNA, VT cardiac arrest. PMH includes colon cancer 2005 S/P hemicolectomy, DM, HTN, HLD, CKD, PVD, carotid artery stenosis, dementia.  Received MD Consult for TF initiation and management. OG tube in place. Per CT scan, enteric catheter is within the gastric lumen. Also noted on CT scan results is a large amount of retained stool within the sigmoid colon and rectal vault, consistent with constipation and fecal impaction. Type 6 BM documented on 9/18.  Noted poor prognosis with goal of successful extubation and transition to Hospice care if able. No plans for HD.   Patient is currently intubated on ventilator support MV: 13.9 L/min Temp (24hrs), Avg:98.1 F (36.7 C), Min:96.8 F (36 C), Max:99.1 F (37.3 C) MAP (cuff) range 64-72 this AM  Labs reviewed.  K 5.3 BUN 107 Creatinine 5.24 Phos 5 CBG:  [phone]  Medications reviewed and include fentanyl , levophed , protonix .  Weight history reviewed. Patient with 19% weight loss over the past 2 months. Suspect malnutrition. Unable to obtain enough information at this time for identification of malnutrition. Patient is at increased risk for refeeding syndrome. May not see lab values drop below normal as quickly due to K & Phos at higher serum levels with renal failure.  NUTRITION - FOCUSED PHYSICAL EXAM:  Unable to complete  Diet Order:   Diet Order             Diet NPO time specified Except for: Sips with Meds  Diet effective now                   EDUCATION NEEDS:   Not appropriate for education at this time  Skin:  Skin Assessment: Skin Integrity Issues: Skin Integrity Issues:: Other (Comment), Unstageable, Stage III Stage III: L & R gluteal cleft Unstageable: buttocks, L & R heel Other: serous blister R hip  Last BM:  9/18 type 6  Height:   Ht Readings from Last 1 Encounters:  07/30/24 6' (1.829 m)    Weight:   Wt Readings from Last 1 Encounters:  08/01/24 55 kg    Ideal Body Weight:  80.9 kg  BMI:  Body mass index is 16.44 kg/m.  Estimated Nutritional Needs:   Kcal:  1650-1900  Protein:  85-95 gm  Fluid:  1.7-1.9 L   Suzen HUNT RD, LDN, CNSC Contact via secure chat. If unavailable, use group chat RD Inpatient.\

## 2024-08-01 NOTE — TOC Initial Note (Signed)
 Transition of Care [facility]) - Initial/Assessment Note    Patient Details  Name: Jonathan Baird MRN: [id number] Date of Birth: 01-08-1933  Transition of Care [facility]) CM/SW Contact:    Marval Gell, RN Phone Number: 08/01/2024, 3:08 PM  Clinical Narrative:                  Notified patient needing home hospice workup by palliative team member. Choice provided and referral made to [facility]  Spoke w daughter and wife at bedside. Daughter [name] would like to be primary contact for coordinating DC.  She confirms patient will return to address in chart, will have 24 hour care from family, and currently has hospital bed at home.  Patient will oxygen set up at home and ambulance transport.  Barrier to DC- patient currently intubated.   Expected Discharge Plan: Home w Hospice Care Barriers to Discharge: Continued Medical Work up   Patient Goals and CMS Choice Patient states their goals for this hospitalization and ongoing recovery are:: to go home w hospice CMS Medicare.gov Compare Post Acute Care list provided to:: Other (Comment Required) Choice offered to / list presented to : Adult Children      Expected Discharge Plan and Services   Discharge Planning Services: CM Consult Post Acute Care Choice: Hospice Living arrangements for the past 2 months: Single Family Home                             HH Agency: [facility] Date [facility] Agency Contacted: 08/01/24 Time HH Agency Contacted: [phone] Representative spoke with at [facility] Agency: [name]  Prior Living Arrangements/Services Living arrangements for the past 2 months: Single Family Home Lives with:: Spouse, Adult Children              Current home services: DME    Activities of Daily Living   ADL Screening (condition at time of admission) Independently performs ADLs?: No Does the patient have a NEW difficulty with bathing/dressing/toileting/self-feeding that is expected to last >3 days?:  No Does the patient have a NEW difficulty with getting in/out of bed, walking, or climbing stairs that is expected to last >3 days?: No Does the patient have a NEW difficulty with communication that is expected to last >3 days?: No Is the patient deaf or have difficulty hearing?: No Does the patient have difficulty seeing, even when wearing glasses/contacts?: No Does the patient have difficulty concentrating, remembering, or making decisions?: No  Permission Sought/Granted                  Emotional Assessment              Admission diagnosis:  Lactic acidosis [E87.20] AKI (acute kidney injury) (HCC) [N17.9] Sepsis (HCC) [A41.9] Altered mental status, unspecified altered mental status type [R41.82] Sepsis, due to unspecified organism, unspecified whether acute organ dysfunction present [facility]) [A41.9] Patient Active Problem List   Diagnosis Date Noted   Sepsis (HCC) 07/30/2024   Small bowel obstruction (HCC) 05/21/2024   Acute hypoxemic respiratory failure (HCC) 05/21/2024   Seizure-like activity (HCC) 05/21/2024   Multifocal pneumonia 05/21/2024   Acute respiratory failure with hypoxia (HCC) 05/21/2024   Protein-calorie malnutrition, severe 05/21/2024   Generalized weakness 12/31/2023   History of dementia 12/31/2023   CKD (chronic kidney disease), stage IV (HCC) 12/31/2023   History of carotid artery stenosis 12/31/2023   History of colon cancer status post colectomy 12/31/2023  Hyperkalemia 12/31/2023   Urinary hesitancy 12/31/2023   Syncope 10/19/2023   CAP (community acquired pneumonia) 10/19/2023   Acute renal failure superimposed on stage 4 chronic kidney disease (HCC) 10/19/2023   Acute encephalopathy 10/19/2023   Elevated troponin 10/19/2023   Insulin  dependent type 2 diabetes mellitus (HCC) 10/19/2023   History of vitrectomy 04/30/2022   Abnormal gait 02/13/2022   Altered mental status 02/13/2022   Unspecified visual loss 02/13/2022   Stage 3b chronic  kidney disease (HCC) 09/25/2021   Stenosis of carotid artery 09/25/2021   Essential hypertension 01/17/2021   Chronic kidney disease due to hypertension 01/17/2021   Diabetic renal disease (HCC) 01/17/2021   Diabetic retinopathy associated with type 2 diabetes mellitus (HCC) 01/17/2021   Disorder of arteries and arterioles, unspecified (HCC) 01/17/2021   Drug-induced hypoglycemia without coma 01/17/2021   Adult general medical exam 01/17/2021   Hyperglycemia due to type 2 diabetes mellitus (HCC) 01/17/2021   Long term (current) use of insulin  (HCC) 01/17/2021   Hyperlipidemia 01/17/2021   PVD (peripheral vascular disease) (HCC) 01/17/2021   Controlled diabetes mellitus with stable proliferative retinopathy of both eyes (HCC) 08/10/2020   Right epiretinal membrane 08/10/2020   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 degeneration of both eyes with subfoveal involvement 08/10/2020   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/10/2020   Colon cancer (HCC) 10/12/2004   PCP:  Verdia Lombard, MD Pharmacy:   [facility] [phone] [place], [place] - [address] [place] [address] Phone: [phone] Fax: [phone]  [facility] [address] [place] [address] Phone: [phone] Fax: [phone]  [id number] [place], [place] - [address] [place] [address] Phone: [phone] Fax: [phone]     Social Drivers of Health (SDOH) Social History: SDOH Screenings   Food Insecurity: Patient Unable To Answer (07/31/2024)  Housing: Unknown (07/31/2024)  Transportation Needs: Patient Unable To Answer (07/31/2024)  Utilities: Patient Unable To Answer (07/31/2024)  Social Connections: Patient Unable To Answer (07/31/2024)  Tobacco Use: Low Risk  (07/30/2024)   SDOH Interventions:     Readmission Risk Interventions     No data to display

## 2024-08-01 NOTE — Plan of Care (Signed)

## 2024-08-01 NOTE — Plan of Care (Signed)
   Brief Palliative Medicine Progress Note:  PMT consult received and chart reviewed. Goals of care completed, full note to follow.  Recommendations: Patient's family understands he may not do well after extubation Goals is to extubate, continue supportive measures (antibiotics, etc) with goal of getting home with hospice Family has been caring for him in the home with wounds and after explanation of home hospice daughter feels she can handle that They understand if he does poorly after extubation he may need to be transitioned to comfort here and pass here Will need to consult TOC to discuss hospice options Will discuss with PCCM Palliative medicine will continue to follow   Thank you for allowing us  to participate in the care of Jonathan Baird  Signed by: Camellia Kays, NP Palliative Medicine Team [facility] CHARGE  Team Phone # [phone] (Nights/Weekends)  08/01/2024, 2:29 PM

## 2024-08-02 DIAGNOSIS — Z7189 Other specified counseling: Secondary | ICD-10-CM | POA: Diagnosis not present

## 2024-08-02 DIAGNOSIS — A419 Sepsis, unspecified organism: Secondary | ICD-10-CM | POA: Diagnosis not present

## 2024-08-02 DIAGNOSIS — R6520 Severe sepsis without septic shock: Secondary | ICD-10-CM | POA: Diagnosis not present

## 2024-08-02 DIAGNOSIS — L89159 Pressure ulcer of sacral region, unspecified stage: Secondary | ICD-10-CM | POA: Diagnosis not present

## 2024-08-02 DIAGNOSIS — Z515 Encounter for palliative care: Secondary | ICD-10-CM | POA: Diagnosis not present

## 2024-08-02 DIAGNOSIS — R6521 Severe sepsis with septic shock: Secondary | ICD-10-CM | POA: Diagnosis not present

## 2024-08-02 DIAGNOSIS — N179 Acute kidney failure, unspecified: Secondary | ICD-10-CM | POA: Diagnosis not present

## 2024-08-02 LAB — CULTURE, BLOOD (ROUTINE X 2): Special Requests: ADEQUATE

## 2024-08-02 LAB — URINE CULTURE: Culture: >=100000 — AB

## 2024-08-02 LAB — CBC
HCT: 19.7 % — ABNORMAL LOW (ref 39.0–52.0)
Hemoglobin: 6.7 g/dL — CL (ref 13.0–17.0)
MCH: 25.5 pg — ABNORMAL LOW (ref 26.0–34.0)
MCHC: 34 g/dL (ref 30.0–36.0)
MCV: 74.9 fL — ABNORMAL LOW (ref 80.0–100.0)
Platelets: 207 K/uL (ref 150–400)
RBC: 2.63 MIL/uL — ABNORMAL LOW (ref 4.22–5.81)
RDW: 15.5 % (ref 11.5–15.5)
WBC: 20.5 K/uL — ABNORMAL HIGH (ref 4.0–10.5)
nRBC: 0 % (ref 0.0–0.2)

## 2024-08-02 LAB — BASIC METABOLIC PANEL WITH GFR
Anion gap: 17 — ABNORMAL HIGH (ref 5–15)
BUN: 122 mg/dL — ABNORMAL HIGH (ref 8–23)
CO2: 20 mmol/L — ABNORMAL LOW (ref 22–32)
Calcium: 8.4 mg/dL — ABNORMAL LOW (ref 8.9–10.3)
Chloride: 105 mmol/L (ref 98–111)
Creatinine, Ser: 5.82 mg/dL — ABNORMAL HIGH (ref 0.61–1.24)
GFR, Estimated: 9 mL/min — ABNORMAL LOW (ref >60)
Glucose, Bld: 277 mg/dL — ABNORMAL HIGH (ref 70–99)
Potassium: 4.4 mmol/L (ref 3.5–5.1)
Sodium: 142 mmol/L (ref 135–145)

## 2024-08-02 LAB — GLUCOSE, CAPILLARY
Glucose-Capillary: 211 mg/dL — ABNORMAL HIGH (ref 70–99)
Glucose-Capillary: 219 mg/dL — ABNORMAL HIGH (ref 70–99)
Glucose-Capillary: 242 mg/dL — ABNORMAL HIGH (ref 70–99)
Glucose-Capillary: 262 mg/dL — ABNORMAL HIGH (ref 70–99)

## 2024-08-02 LAB — ABO/RH: ABO/RH(D): B POS

## 2024-08-02 LAB — PREPARE RBC (CROSSMATCH)

## 2024-08-02 LAB — MAGNESIUM: Magnesium: 2.5 mg/dL — ABNORMAL HIGH (ref 1.7–2.4)

## 2024-08-02 LAB — PHOSPHORUS: Phosphorus: 4.2 mg/dL (ref 2.5–4.6)

## 2024-08-02 MED ORDER — HALOPERIDOL LACTATE 5 MG/ML IJ SOLN: 2.0000 mg | Freq: Four times a day (QID) | INTRAMUSCULAR | Status: DC | PRN

## 2024-08-02 MED ORDER — LORAZEPAM 2 MG/ML IJ SOLN: 0.5000 mg | INTRAMUSCULAR | Status: DC | PRN

## 2024-08-02 MED ORDER — GLYCOPYRROLATE 0.2 MG/ML IJ SOLN
0.4000 mg | INTRAMUSCULAR | Status: DC | PRN
  Administered 2024-08-02 – 2024-08-03 (×3): 0.4 mg via INTRAVENOUS
  Filled 2024-08-02 (×4): qty 2

## 2024-08-02 MED ORDER — SODIUM CHLORIDE 0.9% IV SOLUTION: Freq: Once | INTRAVENOUS | Status: AC

## 2024-08-02 NOTE — Progress Notes (Signed)
 [facility] Nurse Liaison Note  Received notification from Romero Shallow, RN At [facility] that daughter, [name] just got a call from [employer].  They will be delivering the oxygen tonight around 7pm.  Report to hospice team and referral intake.  [name], RN Nurse Liaison [phone]

## 2024-08-02 NOTE — Progress Notes (Signed)
 NAME:  Jonathan Baird, MRN:  [id number], DOB:  [birth year], LOS: 3 ADMISSION DATE:  07/30/2024, CONSULTATION DATE:  9/ REFERRING MD: Darra Fonda MATSU, MD, CHIEF COMPLAINT:  Lethargy for 2 days   History of Present Illness:  A 89 year old male patient with HTN, dyslipidemia, dementia, PVD, DM-2, CKD-IV, right RCA stenosis, carotid stenosis on anticoagulation and ASA, colon cancer s/p hemicolectomy, bed bound since last admission May 22, 2024 (respiratory distress, managed conservatively SBO, aspiration pneumonia, witnessed seizure, AMS, and septic shock), anemia of chronic illness, heels and sacral bedsore, severe PCM, and dysphagia (modified diet), who was brought to ED tonight due to poor oral intake, AMS, and lethargy for 2 days. He is refusing to eat over the past 48 hours. He does have noted wounds to his sacrum (getting worse) as well as bilateral feet. While he was in ED, he developed ventricular tachycardia although maintains a pulse during these episodes. Shortly, he lost pulses and required CPR. He received 1 mg epinephrine  along with amiodarone  150 mg bolus and ROSC was achieved after 6 min.  Patient was intubated with ETT, on PRVC 24/590/+5/50%. Pertinent  Medical History  HTN, dyslipidemia, dementia, PVD, DM-2, CKD-IV, right RCA stenosis, colon cancer s/p hemicolectomy, anemia of chronic illness, heels and sacral bedsore, severe PCM, dysphagia (modified diet), SBO (managed conservatively), carotid stenosis on anticoagulation and ASA.   Significant Hospital Events: Including procedures, antibiotic start and stop dates in addition to other pertinent events   9/18: ED, 2.25 L boluses, ACLS, VT, Amiodarone , Epi, Zosyn , Vanco  PCCM was consulted for admission   9/18 urine culture >> GNR >> Proteus 9/18 blood cultures >> GNR (BCID = Proteus) >> Proteus >>   Interim History / Subjective:  Norepinephrine  has been on/off at low-dose over the last 24 hours Nonsustained VT overnight,  spontaneously resolved 1 unit PRBC given this morning for hemoglobin 6.7 0.30+ PEEP 5, 590 x 20 Fentanyl  50 I/O+ 6.7 L total, urine output 52 cc / 24 hours SCr 5.0 > 5.23 > 5.24 > 5.82  K+ 4.4  Objective    Blood pressure 130/65, pulse (!) 48, temperature 97.9 F (36.6 C), resp. rate (!) 21, height 6' (1.829 m), weight 56.5 kg, SpO2 100%.    Vent Mode: PRVC FiO2 (%):  [30 %-40 %] 30 % Set Rate:  [20 bmp-24 bmp] 20 bmp Vt Set:  [590 mL] 590 mL PEEP:  [5 cmH20] 5 cmH20 Plateau Pressure:  [15 cmH20-17 cmH20] 16 cmH20   Intake/Output Summary (Last 24 hours) at 08/02/2024 0751 Last data filed at 08/02/2024 0700 Gross per 24 hour  Intake 1518.04 ml  Output 52 ml  Net 1466.04 ml   Filed Weights   07/31/24 0125 08/01/24 0351 08/02/24 0400  Weight: 54.3 kg 55 kg 56.5 kg    Examination: General: Thin ill-appearing man laying in bed in no distress HENT: ET tube in good position, no secretions Lungs: Bilaterally Cardiovascular: Distant without murmur Abdomen: Nondistended with hypoactive bowel sounds Extremities: No edema, skin changes bilateral feet Skin: Deep rounded sacral wound, malodorous Neuro: Grimace with stimulation and pain, startles and turns head to voice but does not track, does not follow commands  Resolved problem list  Lactic acidosis  Assessment and Plan  Septic shock  Sacral decubitus ulcer, likely a contributing source and  Probable aspiration PNA Proteus UTI GNR bacteremia, confirmed Proteus with sensitivities pending but BCID reassuring -Follow final sensitivities - Transitioned Zosyn  to ceftriaxone  9/20.  Consider adding back anaerobic coverage for his  aspiration pneumonia if he decompensates in any way - Wean norepinephrine  as able - Wound care  VT cardiac arrest. ROSC after 6 min  HTN, dyslipidemia, PVD, CAD, carotid stenosis on anticoagulation and ASA -Recurrent run of VT overnight 9/20-21 - Optimize electrolytes - Anticoagulation held  Acute  respiratory failure - Plan to wean sedation and work on PSV 9/21 to determine how realistic successful extubation may be.  Family would like to be able to interact with him off MV, hopefully transition to either home hospice or inpatient hospice.  Depending on how he performs on PSV we can discuss whether an extubation for success is realistic.  Dementia Bed bound status for 2.5 months Poor functional status -Unfortunately will impact both acute and chronic prognosis.  DM-2 (HbA1c 7.6%) - Sliding scale insulin  added 9/20  Acute on CKD-IV, oliguric/anuric -Given his overall prognosis, underlying dementia poor candidate for HD.  No plans to recommend -Could consider empiric Lasix  to see if this offers any improvement although could hasten renal decline -Continue to follow BMP until we have transitioned to comfort - Follow urine output  Anemia of chronic illness, ?Fe def.  Hemoglobin 6.79/21, question in part dilutional -No current evidence of active bleeding, follow - PRBC ordered 9/21 - Follow intermittent CBC  Heels and sacral bedsore: unstageable -Appreciate WOC assistance  Severe PCM, dysphagia (on modified diet) -N.p.o.  Nutrition - Continue TF  DNR/DNI - Dr. Dub has d/w daughter and wife 9/18 about his poor clinical condition, co morbidities, poor prognosis, and poor functional status. They agreed to DNR/DNI -Discussion 9/19 with patient's daughter by phone > they want to continue to support medically, continue this level support to see if he stabilizes.  The goal would be a successful extubation, possibly transition to hospice care with home hospice.  Unclear how realistic home care would be, given his anticipated care needs - Consultation to palliative care placed 9/18 >> greatly appreciate assistance.  Family's goal would be a stable extubation with ability to interact with him and possibly get to either home hospice or to inpatient hospice  Labs   CBC: Recent Labs   Lab 07/30/24 1704 07/30/24 1714 07/30/24 1716 07/30/24 1815 08/01/24 1040 08/02/24 0238  WBC 38.4*  --   --   --  23.7* 20.5*  NEUTROABS 34.9*  --   --   --   --   --   HGB 9.5* 10.2* 10.5* 8.2* 7.3* 6.7*  HCT 29.5* 30.0* 31.0* 24.0* 21.2* 19.7*  MCV 79.1*  --   --   --  74.1* 74.9*  PLT 417*  --   --   --  225 207    Basic Metabolic Panel: Recent Labs  Lab 07/30/24 1704 07/30/24 1714 07/30/24 1716 07/30/24 1815 07/31/24 1945 08/01/24 0252 08/02/24 0238  NA 137 138 137 137 139 141 142  K 5.0 4.9 4.9 4.4 6.0* 5.3* 4.4  CL 102 108  --   --  104 103 105  CO2 13*  --   --   --  15* 18* 20*  GLUCOSE 397* 372*  --   --  201* 265* 277*  BUN 97* 100*  --   --  103* 107* 122*  CREATININE 4.63* 5.00*  --   --  5.23* 5.24* 5.82*  CALCIUM  9.2  --   --   --  8.6* 8.4* 8.4*  MG 2.8*  --   --   --   --  2.3 2.5*  PHOS  --   --   --   --   --  5.0* 4.2   GFR: Estimated Creatinine Clearance: 7 mL/min (A) (by C-G formula based on SCr of 5.82 mg/dL (H)). Recent Labs  Lab 07/30/24 1704 07/30/24 1715 07/30/24 1857 08/01/24 0252 08/01/24 1040 08/02/24 0238  WBC 38.4*  --   --   --  23.7* 20.5*  LATICACIDVEN  --  8.9* >15.0* 2.6*  --   --     Liver Function Tests: Recent Labs  Lab 07/30/24 1704  AST 51*  ALT 40  ALKPHOS 106  BILITOT 0.8  PROT 7.8  ALBUMIN 1.8*   No results for input(s): LIPASE, AMYLASE in the last 168 hours. No results for input(s): AMMONIA in the last 168 hours.  ABG    Component Value Date/Time   PHART 7.194 (LL) 07/30/2024 1815   PCO2ART 32.0 07/30/2024 1815   PO2ART 372 (H) 07/30/2024 1815   HCO3 12.5 (L) 07/30/2024 1815   TCO2 14 (L) 07/30/2024 1815   ACIDBASEDEF 15.0 (H) 07/30/2024 1815   O2SAT 100 07/30/2024 1815     Coagulation Profile: Recent Labs  Lab 07/30/24 1704  INR 1.3*    Cardiac Enzymes: Recent Labs  Lab 07/30/24 1704  CKTOTAL 325    HbA1C: Hgb A1c MFr Bld  Date/Time Value Ref Range Status  05/21/2024  04:44 AM 7.6 (H) 4.8 - 5.6 % Final    Comment:    (NOTE)         Prediabetes: 5.7 - 6.4         Diabetes: >6.4         Glycemic control for adults with diabetes: <7.0   10/20/2023 01:25 AM 8.2 (H) 4.8 - 5.6 % Final    Comment:    (NOTE)         Prediabetes: 5.7 - 6.4         Diabetes: >6.4         Glycemic control for adults with diabetes: <7.0     CBG: Recent Labs  Lab 08/01/24 1505 08/01/24 1937 08/02/24 0006 08/02/24 0307 08/02/24 0725  GLUCAP 256* 286* 219* 262* 242*     Critical care time:  34 minutes    Lamar Chris, MD, PhD 08/02/2024, 7:51 AM [facility] [phone] or if no answer before 7:00PM call [phone] For any issues after 7:00PM please call eLink [phone]

## 2024-08-02 NOTE — Progress Notes (Signed)
 eLink Physician-Brief Progress Note Patient Name: QUENTAVIOUS RITTENHOUSE DOB: [birth year] MRN: [id number]   Date of Service  08/02/2024  HPI/Events of Note  Hb is 6.7 . ON pressors and on vent. No bleeding from anywhere reported  eICU Interventions  1 unit RBC transfusion ordered RN to verify consent     Intervention Category Major Interventions: Hemorrhage - evaluation and management  Lorain Fettes G Cameo Shewell 08/02/2024, 3:31 AM

## 2024-08-02 NOTE — Progress Notes (Signed)
 [facility] Hospital Liaison Note  Follow up on new referral for hospice at home received yesterday.  Patient is post extubation this afternoon and is stable at this time.  I spoke with the medical team and patient's daughter, [name] to discuss discharge plan.  Tentative plan is for him to discharge in the morning once oxygen has been placed in the home.  Oxygen ordered through [employer] today with request for STAT and delivery before 10 in the morning.  Patient has been set up for 2pm hospice assessment/admission visit.  Notified hospital team and TOC that patient would need to be discharged and transported home prior to 2pm.  All verbalized understanding.  Per [name], she or her mom will be available in the morning for oxygen delivery.  [employer] given [name] and her mom's number for delivery.  Hospital liaison team will continue following through final disposition.  Please do not hesitate to call with any hospice related questions or concerns.  [name], MA, BSN ,RN, FNE Nurse Liaison [phone]

## 2024-08-02 NOTE — Progress Notes (Signed)
 Palliative:  HPI: 89 y.o. male  with past medical history of HTN, dyslipidemia, dementia, PVD, DM-2, CKD-IV, right RCA stenosis, carotid stenosis on anticoagulation and ASA, colon cancer s/p hemicolectomy, bed bound since last admission May 22, 2024 (respiratory distress, managed conservatively SBO, aspiration pneumonia, witnessed seizure, AMS, and septic shock), anemia of chronic illness, heels and sacral bedsore, severe PCM, and dysphagia (modified diet), who was brought to ED tonight due to poor oral intake, AMS, and lethargy for 2 days. He is refusing to eat over the past 48 hours.  He was admitted on 07/30/2024 with sepsis due to infected sacral bedsore, possible aspiration pneumonia, VT cardiac arrest with ROSC after 6 minutes, lactic acidosis/AGMA due to sepsis and cardiac arrest, dementia, bedbound status for 2.5 months, poor functional status, and others. Palliative medicine was consulted for GOC conversations.  Reviewed notes. Assessed Jonathan Baird on ventilator. He appears to be trying to respond and seems to be making attempts to open eyes but without success. No distress although he is very tense and does not allow for movement to upper extremities with some grimacing during assessment. I also discussed with RN who reports apnea during wake up assessment. I anticipate he will have some respiratory drive once extubated. Plans for extubation today after family arrive. We will monitor and see if home with hospice is an appropriate option after extubation.   Update: I returned to bedside. Jonathan Baird is extubated and family are at bedside. Vital signs are stable and he is resting comfortable. Family asked questions about blood pressure and medications. I share that he is not currently requiring medications but appears restful without medications at this time. We did discuss and family would like to continue to consider home with hospice. They wish to continue to monitor today and reassess in the  morning if returning home is appropriate next step.   Discussed with RN Romero.   All questions/concerns addressed. Emotional support provided.   Exam: Resting comfortably off ventilator support. No distress. Breathing regular, unlabored although slow and shallow. Abd soft. Warm to touch.   Plan: - Extubated to comfort focused care with goal to return home with hospice support - Reassess tomorrow if home is the best plan - PRN medications added to ensure comfort if further decline  35 min  Bernarda Kitty, NP Palliative Medicine Team Pager [phone] (Please see [email or web address] for schedule) Team Phone [phone]

## 2024-08-03 DIAGNOSIS — R6520 Severe sepsis without septic shock: Secondary | ICD-10-CM | POA: Diagnosis not present

## 2024-08-03 DIAGNOSIS — Z515 Encounter for palliative care: Secondary | ICD-10-CM | POA: Diagnosis not present

## 2024-08-03 DIAGNOSIS — Z7189 Other specified counseling: Secondary | ICD-10-CM | POA: Diagnosis not present

## 2024-08-03 DIAGNOSIS — B964 Proteus (mirabilis) (morganii) as the cause of diseases classified elsewhere: Secondary | ICD-10-CM | POA: Diagnosis not present

## 2024-08-03 DIAGNOSIS — L89159 Pressure ulcer of sacral region, unspecified stage: Secondary | ICD-10-CM | POA: Diagnosis not present

## 2024-08-03 DIAGNOSIS — R6521 Severe sepsis with septic shock: Secondary | ICD-10-CM | POA: Diagnosis not present

## 2024-08-03 DIAGNOSIS — A419 Sepsis, unspecified organism: Secondary | ICD-10-CM | POA: Diagnosis not present

## 2024-08-03 LAB — TYPE AND SCREEN
ABO/RH(D): B POS
Antibody Screen: NEGATIVE
Unit division: 0

## 2024-08-03 LAB — BPAM RBC
Blood Product Expiration Date: 202510052359
ISSUE DATE / TIME: 202509210515
Unit Type and Rh: 7300

## 2024-08-03 MED ORDER — POLYVINYL ALCOHOL 1.4 % OP SOLN: 1.0000 [drp] | Freq: Four times a day (QID) | OPHTHALMIC | Status: DC | PRN

## 2024-08-03 MED ORDER — ONDANSETRON 4 MG PO TBDP: 4.0000 mg | ORAL_TABLET | Freq: Four times a day (QID) | ORAL | Status: DC | PRN

## 2024-08-03 MED ORDER — ONDANSETRON HCL 4 MG/2ML IJ SOLN: 4.0000 mg | Freq: Four times a day (QID) | INTRAMUSCULAR | Status: DC | PRN

## 2024-08-03 MED ORDER — FENTANYL 2500MCG IN NS 250ML (10MCG/ML) PREMIX INFUSION
0.0000 ug/h | INTRAVENOUS | Status: DC
  Administered 2024-08-03 – 2024-08-06 (×3): 75 ug/h via INTRAVENOUS
  Filled 2024-08-03 (×3): qty 250

## 2024-08-03 MED ORDER — HALOPERIDOL LACTATE 5 MG/ML IJ SOLN: 2.5000 mg | INTRAMUSCULAR | Status: DC | PRN

## 2024-08-03 MED ORDER — MIDAZOLAM HCL 2 MG/2ML IJ SOLN
2.0000 mg | INTRAMUSCULAR | Status: DC | PRN
  Administered 2024-08-03 – 2024-08-06 (×3): 2 mg via INTRAVENOUS
  Filled 2024-08-03 (×3): qty 2

## 2024-08-03 MED ORDER — SODIUM CHLORIDE 0.9 % IV SOLN: INTRAVENOUS | Status: AC

## 2024-08-03 MED ORDER — FENTANYL BOLUS VIA INFUSION
100.0000 ug | INTRAVENOUS | Status: DC | PRN
  Administered 2024-08-03 (×3): 100 ug via INTRAVENOUS

## 2024-08-03 NOTE — Progress Notes (Signed)
 Nutrition Brief Note  Chart reviewed. Extubated on 9/21, plan for hospice, comfort care focus. Discharge to home hospice vs in-hospital death. Currently NPO.    No further nutrition interventions planned at this time.  Please re-consult as needed.   Betsey Finger MS, RDN, LDN, CNSC Registered Dietitian 3 Clinical Nutrition RD Inpatient Contact Info in Amion

## 2024-08-03 NOTE — Progress Notes (Signed)
 Wasted 2.44ml of Fentanyl  2500 mcg with charge nurse Kate Ada, RN

## 2024-08-03 NOTE — Hospital Course (Signed)
 89 y.o. M with dementia, lives at home, CKD IV, DM, HTN, HLD, PVD, hx stage IIIb colon CA in 2005 s/p hemicolectomy in sustained remission, severe PCM, and mostly bedbound since July admission who presented with decreased alertness, decreased oral intake, ?infected sacral wound.    In the ER, had runs of VT then went into VT cardiac arrest, was resuscitated, intubated and admitted to ICU.  Subsequently found to have Proteus bacteremia.  Extubated 9/21 and fentanyl  drip started.  Palliative involved.

## 2024-08-03 NOTE — Progress Notes (Addendum)
 NAME:  Jonathan Baird, MRN:  [id number], DOB:  09-12-1933, LOS: 4 ADMISSION DATE:  07/30/2024, CONSULTATION DATE:  9/ REFERRING MD: Darra Fonda MATSU, MD, CHIEF COMPLAINT:  Lethargy for 2 days   History of Present Illness:  A 89 year old male patient with HTN, dyslipidemia, dementia, PVD, DM-2, CKD-IV, right RCA stenosis, carotid stenosis on anticoagulation and ASA, colon cancer s/p hemicolectomy, bed bound since last admission May 22, 2024 (respiratory distress, managed conservatively SBO, aspiration pneumonia, witnessed seizure, AMS, and septic shock), anemia of chronic illness, heels and sacral bedsore, severe PCM, and dysphagia (modified diet), who was brought to ED tonight due to poor oral intake, AMS, and lethargy for 2 days. He is refusing to eat over the past 48 hours. He does have noted wounds to his sacrum (getting worse) as well as bilateral feet. While he was in ED, he developed ventricular tachycardia although maintains a pulse during these episodes. Shortly, he lost pulses and required CPR. He received 1 mg epinephrine  along with amiodarone  150 mg bolus and ROSC was achieved after 6 min.  Patient was intubated with ETT, on PRVC 24/590/+5/50%. Pertinent  Medical History  HTN, dyslipidemia, dementia, PVD, DM-2, CKD-IV, right RCA stenosis, colon cancer s/p hemicolectomy, anemia of chronic illness, heels and sacral bedsore, severe PCM, dysphagia (modified diet), SBO (managed conservatively), carotid stenosis on anticoagulation and ASA.   Significant Hospital Events: Including procedures, antibiotic start and stop dates in addition to other pertinent events   9/18: ED, 2.25 L boluses, ACLS, VT, Amiodarone , Epi, Zosyn , Vanco  PCCM was consulted for admission   9/18 urine culture >> GNR >> Proteus 9/18 blood cultures >> GNR (BCID = Proteus) >> Proteus >>  9/21 extubated. Subsequently started on fent gtt 9/22 remains on fent gtt for resp sx. Palliative care following. I would recommend  inpatient comfort care instead of home hospice given gent ftt needs   Interim History / Subjective:  Extubated yesterday Fent gtt started yesterday late afternoon   Objective    Blood pressure 119/63, pulse 95, temperature 99.8 F (37.7 C), resp. rate 10, height 6' (1.829 m), weight 54.3 kg, SpO2 95%.    Vent Mode: PRVC FiO2 (%):  [30 %] 30 % Set Rate:  [12 bmp] 12 bmp Vt Set:  [590 mL] 590 mL PEEP:  [5 cmH20] 5 cmH20 Plateau Pressure:  [14 cmH20] 14 cmH20   Intake/Output Summary (Last 24 hours) at 08/03/2024 9071 Last data filed at 08/03/2024 0700 Gross per 24 hour  Intake 198.71 ml  Output 83 ml  Net 115.71 ml   Filed Weights   08/01/24 0351 08/02/24 0400 08/03/24 0530  Weight: 55 kg 56.5 kg 54.3 kg    Examination: General: elderly M with labored breathing  HENT: NCAT pink mm  Lungs: +accessory muscle use  Cardiovascular: cap refill < 3 sec  Abdomen thin soft  Extremities: no obvious acute joint deformity  Skin: dry  Neuro: does not awaken or follow commands.   Resolved problem list  Lactic acidosis  Assessment and Plan   GOC DNR Encounter for palliative care Septic shock -proteus bacteremia, aspiration PNA, infected sacral decub  VT arrest Acute encephalopathy superimposed on baseline dementia Acute resp failure w hypoxia  Hx HTN PVD CAD CAS DM2 AKI on CKD IV  Bed bound state  Physical deconditioning  Anemia of chronic dz  Pressure injuries POA Severe protein calorie malnutrition Dysphagia  P -extubated 9/21 with hope of being stable enough for home hospice -unfortunately his respiratory status  has deteriorated and he was started on fent gtt for WOB -I recommend inpatient comfort care, with anticipated in-hospital death -- would rec change to morphine gtt, txf to palli floor.  -palliative care is planning to call family, I am deferring order changes for now until they can speak and will coordinate w palliative after their conversation     Labs    CBC: Recent Labs  Lab 07/30/24 1704 07/30/24 1714 07/30/24 1716 07/30/24 1815 08/01/24 1040 08/02/24 0238  WBC 38.4*  --   --   --  23.7* 20.5*  NEUTROABS 34.9*  --   --   --   --   --   HGB 9.5* 10.2* 10.5* 8.2* 7.3* 6.7*  HCT 29.5* 30.0* 31.0* 24.0* 21.2* 19.7*  MCV 79.1*  --   --   --  74.1* 74.9*  PLT 417*  --   --   --  225 207    Basic Metabolic Panel: Recent Labs  Lab 07/30/24 1704 07/30/24 1714 07/30/24 1716 07/30/24 1815 07/31/24 1945 08/01/24 0252 08/02/24 0238  NA 137 138 137 137 139 141 142  K 5.0 4.9 4.9 4.4 6.0* 5.3* 4.4  CL 102 108  --   --  104 103 105  CO2 13*  --   --   --  15* 18* 20*  GLUCOSE 397* 372*  --   --  201* 265* 277*  BUN 97* 100*  --   --  103* 107* 122*  CREATININE 4.63* 5.00*  --   --  5.23* 5.24* 5.82*  CALCIUM  9.2  --   --   --  8.6* 8.4* 8.4*  MG 2.8*  --   --   --   --  2.3 2.5*  PHOS  --   --   --   --   --  5.0* 4.2   GFR: Estimated Creatinine Clearance: 6.7 mL/min (A) (by C-G formula based on SCr of 5.82 mg/dL (H)). Recent Labs  Lab 07/30/24 1704 07/30/24 1715 07/30/24 1857 08/01/24 0252 08/01/24 1040 08/02/24 0238  WBC 38.4*  --   --   --  23.7* 20.5*  LATICACIDVEN  --  8.9* >15.0* 2.6*  --   --     Liver Function Tests: Recent Labs  Lab 07/30/24 1704  AST 51*  ALT 40  ALKPHOS 106  BILITOT 0.8  PROT 7.8  ALBUMIN 1.8*   No results for input(s): LIPASE, AMYLASE in the last 168 hours. No results for input(s): AMMONIA in the last 168 hours.  ABG    Component Value Date/Time   PHART 7.194 (LL) 07/30/2024 1815   PCO2ART 32.0 07/30/2024 1815   PO2ART 372 (H) 07/30/2024 1815   HCO3 12.5 (L) 07/30/2024 1815   TCO2 14 (L) 07/30/2024 1815   ACIDBASEDEF 15.0 (H) 07/30/2024 1815   O2SAT 100 07/30/2024 1815     Coagulation Profile: Recent Labs  Lab 07/30/24 1704  INR 1.3*    Cardiac Enzymes: Recent Labs  Lab 07/30/24 1704  CKTOTAL 325    HbA1C: Hgb A1c MFr Bld  Date/Time Value Ref Range  Status  05/21/2024 04:44 AM 7.6 (H) 4.8 - 5.6 % Final    Comment:    (NOTE)         Prediabetes: 5.7 - 6.4         Diabetes: >6.4         Glycemic control for adults with diabetes: <7.0   10/20/2023 01:25 AM 8.2 (H) 4.8 - 5.6 %  Final    Comment:    (NOTE)         Prediabetes: 5.7 - 6.4         Diabetes: >6.4         Glycemic control for adults with diabetes: <7.0     CBG: Recent Labs  Lab 08/01/24 1937 08/02/24 0006 08/02/24 0307 08/02/24 0725 08/02/24 1121  GLUCAP 286* 219* 262* 242* 211*     Low MDM  Ronnald Gave MSN, AGACNP-BC [facility] Pulmonary/Critical Care Medicine Amion for pager  08/03/2024, 9:28 AM

## 2024-08-03 NOTE — Plan of Care (Signed)
  Problem: Pain Managment: Goal: General experience of comfort will improve and/or be controlled Outcome: Progressing

## 2024-08-03 NOTE — Plan of Care (Signed)
  Problem: Safety: Goal: Ability to remain free from injury will improve Outcome: Progressing   Problem: Pain Managment: Goal: General experience of comfort will improve and/or be controlled Outcome: Progressing

## 2024-08-03 NOTE — IPAL (Signed)
  Interdisciplinary Goals of Care Family Meeting   Date carried out: 08/03/2024  Location of the meeting: Bedside  Member's involved: Nurse Practitioner, Bedside Registered Nurse, Family Member or next of kin, and Palliative care team member  Durable Power of Attorney or acting medical decision maker: wife    Discussion: We discussed goals of care for Jonathan Baird. Met w pt wife, pt daughter Jonathan Baird + Palliative care team.  Discussed that he is in the dying process and given medication requirement/status change, will not be stable for home hospice.  Daughter asked re resuming antibiotics for days/weeks-- we discussed his infection, and that antibiotics unfortunately will not help him recover from this. He is in the dying process, and comfort and dignity is all we can offer to help at this juncture. Daughter asked for time to talk w her mom before other daughter arrived.    I revisited family after the patient's other daughter arrived w her significant other, joined by Charity fundraiser. We talked again about the patient's status and that he is in the dying process, and again reviewed the futility of antibiotics.  I showed family in detail the patients current EOL s/sx, especially his accessory muscle use to breathe. They understand why home hospice is not a feasible path. We talked about inpatient comfort care at the end of life and discussed what this means -- cessation of labs, imaging, painful interventions, and medications that are not aimed at comfort + focusing on symptom management with pain and anxiety medications.   Family is in agreement with this. Fentanyl  was previously ordered, I have augmented the order to reflect comfort care orderset & added PRN versed , incr range of PRN haldol .   All questions answered   Code status:   Code Status: Do not attempt resuscitation (DNR) - Comfort care   Disposition: In-patient comfort care  Time spent for the meeting:   Jonathan Gave MSN,  AGACNP-BC [facility] Pulmonary/Critical Care Medicine 08/03/2024, 2:18 PM

## 2024-08-03 NOTE — Progress Notes (Signed)
 Palliative:  HPI: 89 y.o. male  with past medical history of HTN, dyslipidemia, dementia, PVD, DM-2, CKD-IV, right RCA stenosis, carotid stenosis on anticoagulation and ASA, colon cancer s/p hemicolectomy, bed bound since last admission May 22, 2024 (respiratory distress, managed conservatively SBO, aspiration pneumonia, witnessed seizure, AMS, and septic shock), anemia of chronic illness, heels and sacral bedsore, severe PCM, and dysphagia (modified diet), who was brought to ED tonight due to poor oral intake, AMS, and lethargy for 2 days. He is refusing to eat over the past 48 hours.  He was admitted on 07/30/2024 with sepsis due to infected sacral bedsore, possible aspiration pneumonia, VT cardiac arrest with ROSC after 6 minutes, lactic acidosis/AGMA due to sepsis and cardiac arrest, dementia, bedbound status for 2.5 months, poor functional status, and others. Palliative medicine was consulted for GOC conversations.   I met today at bedside with RN and PCCM Ronnald Gave NP. Discussed decline overnight. No family at bedside.   I called and discussed with daughter, [name]. [name] shared concerns that her father's antibiotics were stopped when he came off the ventilator. I discussed further with her my larger concern that her father has declined further overnight and that we do not feel he is stable to return home with hospice. We spent time reviewing his status and decline and why antibiotics are not going to alter his course or poor prognosis. [name] was asking about other interventions such as feeding tube and I shared that I feel that we need to have a more complex discussion together with her mother and the medical team to discuss path forward.  Update: I met today at bedside with wife Mrs. Carvin, daughter [name], myself, and Ronnald Gave NP PCCM. Together we explained that Mr. Rennert is unfortunately at end of life. We discussed prognosis is anticipated to be hours to days regardless of  interventions provided. We explained the risks vs benefits of interventions and the limited benefits of antibiotic treatment at this stage. We discussed that signs that his body is failing and is at end of life. We reviewed that his decline is not a direct result of infections but rather a host of many issues taking a toll over much time. We discussed time it takes to treat a bacteremia and that we is not expected to survive to even complete this treatment to have any benefit. We explained that the best way we can care for him is to treat symptoms to ensure he is resting comfortably and not in pain. I explained that keeping him comfortably is something we can do for him and will be the best way we can care for him.   Mrs. Hathorne is appropriately tearful. She asks if he is having any pain and I shared that I do believe he has had some discomfort but we are treating this with pain medication and we can provide more if needed. [name] asks for time to spend and speak privately as a family.   Update: Returned to bedside. Other daughter and family now present as well. Continuing comfort care at this time. No further questions or concerns from family. Son-in-law asking appropriate questions about symptom management, signs of discomfort, and how long. We discussed prognosis likely hours to days.   Exam: Unresponsive. No distress but still very tense. Breathing with accessory muscle use. Temporal muscle wasting. Abd flat.   Plan: - DNR/DNI - Comfort care - Anticipate hospital death  58 min  Bernarda Kitty, NP Palliative Medicine Team Pager [phone] (Please  see [email or web address] for schedule) Team Phone [phone]

## 2024-08-04 DIAGNOSIS — A419 Sepsis, unspecified organism: Secondary | ICD-10-CM | POA: Diagnosis not present

## 2024-08-04 DIAGNOSIS — R6520 Severe sepsis without septic shock: Secondary | ICD-10-CM | POA: Diagnosis not present

## 2024-08-04 DIAGNOSIS — R4589 Other symptoms and signs involving emotional state: Secondary | ICD-10-CM

## 2024-08-04 DIAGNOSIS — Z7189 Other specified counseling: Secondary | ICD-10-CM | POA: Diagnosis not present

## 2024-08-04 DIAGNOSIS — R4182 Altered mental status, unspecified: Secondary | ICD-10-CM | POA: Diagnosis not present

## 2024-08-04 DIAGNOSIS — Z515 Encounter for palliative care: Secondary | ICD-10-CM | POA: Diagnosis not present

## 2024-08-04 DIAGNOSIS — N170 Acute kidney failure with tubular necrosis: Secondary | ICD-10-CM | POA: Diagnosis not present

## 2024-08-04 NOTE — Progress Notes (Signed)
-  Family declined wound care

## 2024-08-04 NOTE — Progress Notes (Signed)
 Daily Progress Note   Date: 08/04/2024   Patient Name: Jonathan Baird  DOB: [birth year]  MRN: [id number]  Age / Sex: 89 y.o., male  Attending Physician: Arlice Reichert, MD Primary Care Physician: Verdia Lombard, MD Admit Date: 07/30/2024 Length of Stay: 5 days  Reason for Follow-up: Establishing goals of care  Past Medical History:  Diagnosis Date   Carotid artery stenosis    Right   CKD (chronic kidney disease), stage IV (HCC)    Colon cancer (HCC) 10/12/2004   stage III, s/p hemicolectomy   Dementia (HCC)    Diabetes mellitus    HLD (hyperlipidemia)    HTN (hypertension)    Peripheral vascular disease     Subjective:   Subjective: Chart Reviewed. Updates received. Patient Assessed. Created space and opportunity for patient  and family to explore thoughts and feelings regarding current medical situation.  Today's Discussion: Today before meeting with the patient/family, I reviewed the chart notes including PCCM note from yesterday, dietitian note from yesterday, IPAL note from yesterday, nursing notes from yesterday, nursing note from today.  I also reviewed vital signs, nursing flowsheets, medication administrations record, labs, and imaging.  No labs in the past 24 hours due to comfort care status.  Vital signs today show temperature of 98.8, heart rate of 80, respiratory rate of 12, blood pressure 126/45, saturating 100% on 2 L/min.  Review medication record shows continued fentanyl  drip at 75 mcg/min.  The patient has also received 100 mcg boluses of fentanyl  3 times yesterday, none this morning.  Robinul  0.4 mg was received yesterday for 1 dose.  Also 1 dose of Versed  yesterday (2 mg).  I feel ongoing parenteral medications are indicated for symptom management at end-of-life.  Today I saw the patient at bedside, her daughter Jonathan Baird was present.  She was on a virtual phone call but ended the call for our conversation.  We spent time talking through the events over the past  weekend.  The goal confirms that the patient is on comfort care due to the inability to safely and comfortably transition to home hospice.  Spent a lot of time talking about his current clinical status.  Jonathan Baird wonders if leaving him on the ventilator would have allowed him to get antibiotics for another 1 to 2 weeks to be able to get home.  I shared that, in my opinion, I do not think that antibiotics would have made a difference.  I shared that pursuing that path, in my opinion, we have resulted in profound weakness that would have made it impossible to come off the ventilator for likely cause more struggle after extubation.  I shared that his decline is likely a cause of the infection rather than the infection causing his decline.  We spent time talking about the previous several months and she does note a significant change over the past months indicative of decline.  Spent a lot of time talking about caregiver stress and providing support tunical.  I offered spiritual care consult for conversation with the chaplain and she agreed.  Also offered to contact social work to provide resources in the community for counseling to help with grief and loss.  She also excepted this.  I shared that I would return tomorrow to check in on the patient and on medical and she expressed appreciation.   I provided emotional and general support through therapeutic listening, empathy, sharing of stories, and other techniques. I answered all questions and addressed all concerns to the best  of my ability.  After seeing the patient and medical I spoke with social worker who agreed to provide community resources to the patient's daughter.  Review of Systems  Unable to perform ROS   Objective:   Primary Diagnoses: Present on Admission:  Sepsis (HCC)   Vital Signs:  BP (!) 126/45 (BP Location: Right Arm)   Pulse 80   Temp 98.8 F (37.1 C) (Axillary)   Resp 12   Ht 6' (1.829 m)   Wt 54.3 kg   SpO2 100%   BMI  16.24 kg/m   Physical Exam Vitals and nursing note reviewed.  Constitutional:      General: He is sleeping. He is not in acute distress.    Appearance: He is ill-appearing.  HENT:     Head: Normocephalic and atraumatic.  Cardiovascular:     Rate and Rhythm: Normal rate.  Pulmonary:     Effort: Pulmonary effort is normal. No respiratory distress.     Comments: No audible secretions Abdominal:     General: Abdomen is flat.  Skin:    General: Skin is warm and dry.  Neurological:     Mental Status: He is unresponsive.   On fentanyl  drip  Palliative Assessment/Data: 10%   Assessment & Plan:   HPI/Patient Profile:  89 y.o. male  with past medical history of HTN, dyslipidemia, dementia, PVD, DM-2, CKD-IV, right RCA stenosis, carotid stenosis on anticoagulation and ASA, colon cancer s/p hemicolectomy, bed bound since last admission May 22, 2024 (respiratory distress, managed conservatively SBO, aspiration pneumonia, witnessed seizure, AMS, and septic shock), anemia of chronic illness, heels and sacral bedsore, severe PCM, and dysphagia (modified diet), who was brought to ED tonight due to poor oral intake, AMS, and lethargy for 2 days. He is refusing to eat over the past 48 hours.  He was admitted on 07/30/2024 with sepsis due to infected sacral bedsore, possible aspiration pneumonia, VT cardiac arrest with ROSC after 6 minutes, lactic acidosis/AGMA due to sepsis and cardiac arrest, dementia, bedbound status for 2.5 months, poor functional status, and others.   Palliative medicine was consulted for GOC conversations.  SUMMARY OF RECOMMENDATIONS   DNR-comfort Continue comfort care See symptom management orders below Ongoing emotional support of patient's family Palliative medicine will continue to follow  Symptom Management:  Artificial tears 1 drop OU 4 times daily as needed dry eyes Fentanyl  infusion 0 to 400 mcg/h titrate per instructions Fentanyl  bolus via infusion 100 mcg IV  every 15 minutes as needed uncontrolled pain, distress, tachypnea Robinul  0.4 mg IV every 4 hours as needed secretions Haldol  2.5 to 5 mg IV every 4 hours as needed agitation or psychosis Versed  2 to 4 mg IV every 4 hours as needed agitation, sedation, anxiety, seizures Zofran  4 mg IV every 6 hours as needed nausea  Code Status: DNR - Limited (DNR/DNI)  Prognosis: Hours - Days  Discharge Planning: Anticipated Hospital Death  Discussed with: Patient's family, medical team, nursing team, medical team  Thank you for allowing us  to participate in the care of IVOR KISHI PMT will continue to support holistically.  Billing based on MDM: High  Problems Addressed: One acute or chronic illness or injury that poses a threat to life or bodily function  Risks: Parenteral controlled substances  Detailed review of medical records (labs, imaging, vital signs), medically appropriate exam, discussed with treatment team, counseling and education to patient, family, & staff, documenting clinical information, medication management, coordination of care  Camellia Kays, NP Palliative  Medicine Team  Team Phone # [phone] (Nights/Weekends)  07/11/2021, 8:17 AM

## 2024-08-04 NOTE — Progress Notes (Signed)
 Ordered compassion tray for family

## 2024-08-04 NOTE — Plan of Care (Signed)
  Problem: Pain Managment: Goal: General experience of comfort will improve and/or be controlled Outcome: Progressing   Problem: Safety: Goal: Ability to remain free from injury will improve Outcome: Progressing

## 2024-08-04 NOTE — Progress Notes (Signed)
 This chaplain responded to PMT NP-Eric consult for EOL spiritual care for the Pt. daughter-Nicole. The chaplain read the Pt. chart notes and received an update from the PMT before the visit.  [name] is at the bedside at the time of the visit.  The chaplain listened reflectively as [name] shared her anticipatory grief and regret with the chaplain. [name] shares her role as caregiver and companion when she moved back home with her parents. [name] is interested in grief care in small groups. The chaplain understands PMT will ask SW to explore opportunities in community.   The chaplain was able to affirm Nicole's presence as a caregiver for the Pt. and identify examples of the Pt. paternal love for [place]. [name] shared memories of the Pt. through storytelling and was able to feel herself smile.   [name] appreciated the chaplain's visit and accepted the chaplain's invitation for F/U spiritual care.   Chaplain Leeroy Hummer [phone]

## 2024-08-04 NOTE — Progress Notes (Signed)
 Requested medihoney from pharmacy

## 2024-08-04 NOTE — Care Management Important Message (Signed)
 Important Message  Patient Details  Name: [name] MRN: [id number] Date of Birth: January 17, 1933   Important Message Given:  Yes - Medicare IM     Jon Cruel 08/04/2024, 4:17 PM

## 2024-08-04 NOTE — Progress Notes (Signed)
 PROGRESS NOTE  Jonathan Baird  DOB: 1932-12-14  PCP: Verdia Lombard, MD FMW:[id number]  DOA: 07/30/2024  LOS: 5 days  Hospital Day: 6  Brief narrative: Jonathan Baird is a 89 y.o. male with PMH significant for dementia, DM2, HTN, HLD, PVD, carotid artery stenosis, CKD, colon cancer 2005 status post hemicolectomy, severe protein calorie malnutrition, mostly bedbound for at least the last 3 months. 9/18, patient presented with altered mental status, poor oral intake, infected sacral wound.  In the ER, had runs of VT then went into VT cardiac arrest, was resuscitated, intubated and admitted to ICU.  Subsequently found to have Proteus bacteremia.   Palliative care was consulted.  After a family meeting, patient was switched to comfort care status Extubated 9/21 with the hope that he would be stable for home hospice.  Unfortunately his respiratory status has deteriorated.  He was started on fentanyl  drip for work of breathing.   9/23, transferred out to [facility] Anticipated in-hospital death  Subjective: Patient was seen and examined this morning. Elderly African-American male.  Lying down in bed.  On low-flow oxygen.  Comfortable.  Patient daughter is [name] at bedside. Hemodynamically stable this morning  Assessment and plan: Comfort care status Chronically sick with multiple comorbidities, severe malnutrition and persistent physical and mental decline Admitted this time after a cardiac arrest.  Found to have Proteus bacteremia. After discussion with family, comfort measures were started In-hospital mortality likely Comfort care order set in use  Other medical issues Septic shock -proteus bacteremia, aspiration PNA, infected sacral decub  VT arrest Acute encephalopathy superimposed on baseline dementia Acute resp failure w hypoxia  Hx HTN PVD CAD CAS DM2 AKI on CKD IV  Bed bound state  Physical deconditioning  Anemia of chronic dz  Pressure injuries POA Severe protein  calorie malnutrition Dysphagia    Goals of care   Code Status: Do not attempt resuscitation (DNR) - Comfort care   Consultants: Palliative care Family Communication: Daughter [name] at bedside  Status: Inpatient Level of care:  Palliative Care   Patient is from: Home Needs to continue in-hospital care: In-hospital mortality likely     Diet:  Diet Order             Diet NPO time specified  Diet effective now                   Scheduled Meds:  Chlorhexidine  Gluconate Cloth  6 each Topical Daily   leptospermum manuka honey  1 Application Topical Daily    PRN meds: artificial tears, fentaNYL , glycopyrrolate , haloperidol  lactate, midazolam , ondansetron  **OR** ondansetron  (ZOFRAN ) IV, mouth rinse   Infusions:   sodium chloride      fentaNYL  infusion INTRAVENOUS 75 mcg/hr (08/03/24 1852)    Antimicrobials: Anti-infectives (From admission, onward)    Start     Dose/Rate Route Frequency Ordered Stop   08/01/24 2200  cefTRIAXone  (ROCEPHIN ) 2 g in sodium chloride  0.9 % 100 mL IVPB  Status:  Discontinued        2 g 200 mL/hr over 30 Minutes Intravenous Every 24 hours 08/01/24 1526 08/02/24 1344   07/31/24 1600  piperacillin -tazobactam (ZOSYN ) IVPB 2.25 g  Status:  Discontinued        2.25 g 100 mL/hr over 30 Minutes Intravenous Every 8 hours 07/31/24 1509 08/01/24 1526   07/30/24 1715  vancomycin  (VANCOREADY) IVPB 1500 mg/300 mL  Status:  Discontinued        1,500 mg 150 mL/hr over 120 Minutes Intravenous  Once 07/30/24 1702 07/30/24 1712   07/30/24 1715  piperacillin -tazobactam (ZOSYN ) IVPB 3.375 g        3.375 g 100 mL/hr over 30 Minutes Intravenous  Once 07/30/24 1702 07/30/24 1848   07/30/24 1715  vancomycin  (VANCOCIN ) IVPB 1000 mg/200 mL premix        1,000 mg 200 mL/hr over 60 Minutes Intravenous  Once 07/30/24 1712 07/30/24 1951   07/30/24 1700  vancomycin  (VANCOCIN ) IVPB 1000 mg/200 mL premix  Status:  Discontinued        1,000 mg 200 mL/hr over 60 Minutes  Intravenous  Once 07/30/24 1659 07/30/24 1702       Objective: Vitals:   08/03/24 1735 08/04/24 0500  BP: (!) 118/54 (!) 126/45  Pulse: 81 80  Resp: 16 12  Temp: 97.8 F (36.6 C) 98.8 F (37.1 C)  SpO2: 99% 100%    Intake/Output Summary (Last 24 hours) at 08/04/2024 1115 Last data filed at 08/04/2024 0500 Gross per 24 hour  Intake 147.53 ml  Output 50 ml  Net 97.53 ml   Filed Weights   08/01/24 0351 08/02/24 0400 08/03/24 0530  Weight: 55 kg 56.5 kg 54.3 kg   Weight change:  Body mass index is 16.24 kg/m.   Physical Exam: General exam: Elderly African-American male.  Somnolent, comfortable on fentanyl  drip. I did not do a detailed examination because of comfort care status.  Data Review: I have personally reviewed the laboratory data and studies available.   Signed, Chapman Rota, MD [facility] 08/04/2024

## 2024-08-04 NOTE — Progress Notes (Signed)
 PHARMACY - PHYSICIAN COMMUNICATION CRITICAL VALUE ALERT - BLOOD CULTURE IDENTIFICATION (BCID)  Jonathan Baird is an 89 y.o. male who presented to [facility] on 07/30/2024 with a chief complaint of poor oral intake, AMS, lethargy.  Assessment:  GPR in 1/4 anaerobic bottles  Name of physician (or Provider) Contacted: Dahal  Current antibiotics: no antibiotics  Changes to prescribed antibiotics recommended:  Patient is comfort care - No changes recommended  Results for orders placed or performed during the hospital encounter of 07/30/24  Blood Culture ID Panel (Reflexed) (Collected: 07/30/2024  5:03 PM)  Result Value Ref Range   Enterococcus faecalis NOT DETECTED NOT DETECTED   Enterococcus Faecium NOT DETECTED NOT DETECTED   Listeria monocytogenes NOT DETECTED NOT DETECTED   Staphylococcus species NOT DETECTED NOT DETECTED   Staphylococcus aureus (BCID) NOT DETECTED NOT DETECTED   Staphylococcus epidermidis NOT DETECTED NOT DETECTED   Staphylococcus lugdunensis NOT DETECTED NOT DETECTED   Streptococcus species NOT DETECTED NOT DETECTED   Streptococcus agalactiae NOT DETECTED NOT DETECTED   Streptococcus pneumoniae NOT DETECTED NOT DETECTED   Streptococcus pyogenes NOT DETECTED NOT DETECTED   A.calcoaceticus-baumannii NOT DETECTED NOT DETECTED   Bacteroides fragilis NOT DETECTED NOT DETECTED   Enterobacterales DETECTED (A) NOT DETECTED   Enterobacter cloacae complex NOT DETECTED NOT DETECTED   Escherichia coli NOT DETECTED NOT DETECTED   Klebsiella aerogenes NOT DETECTED NOT DETECTED   Klebsiella oxytoca NOT DETECTED NOT DETECTED   Klebsiella pneumoniae NOT DETECTED NOT DETECTED   Proteus species DETECTED (A) NOT DETECTED   Salmonella species NOT DETECTED NOT DETECTED   Serratia marcescens NOT DETECTED NOT DETECTED   Haemophilus influenzae NOT DETECTED NOT DETECTED   Neisseria meningitidis NOT DETECTED NOT DETECTED   Pseudomonas aeruginosa NOT DETECTED NOT DETECTED    Stenotrophomonas maltophilia NOT DETECTED NOT DETECTED   Candida albicans NOT DETECTED NOT DETECTED   Candida auris NOT DETECTED NOT DETECTED   Candida glabrata NOT DETECTED NOT DETECTED   Candida krusei NOT DETECTED NOT DETECTED   Candida parapsilosis NOT DETECTED NOT DETECTED   Candida tropicalis NOT DETECTED NOT DETECTED   Cryptococcus neoformans/gattii NOT DETECTED NOT DETECTED   CTX-M ESBL NOT DETECTED NOT DETECTED   Carbapenem resistance IMP NOT DETECTED NOT DETECTED   Carbapenem resistance KPC NOT DETECTED NOT DETECTED   Carbapenem resistance NDM NOT DETECTED NOT DETECTED   Carbapenem resist OXA 48 LIKE NOT DETECTED NOT DETECTED   Carbapenem resistance VIM NOT DETECTED NOT DETECTED    [name] 08/04/2024  8:24 AM

## 2024-08-05 DIAGNOSIS — Z515 Encounter for palliative care: Secondary | ICD-10-CM | POA: Diagnosis not present

## 2024-08-05 DIAGNOSIS — R6520 Severe sepsis without septic shock: Secondary | ICD-10-CM | POA: Diagnosis not present

## 2024-08-05 DIAGNOSIS — Z66 Do not resuscitate: Secondary | ICD-10-CM | POA: Diagnosis not present

## 2024-08-05 DIAGNOSIS — A419 Sepsis, unspecified organism: Secondary | ICD-10-CM | POA: Diagnosis not present

## 2024-08-05 DIAGNOSIS — N170 Acute kidney failure with tubular necrosis: Secondary | ICD-10-CM | POA: Diagnosis not present

## 2024-08-05 DIAGNOSIS — Z7189 Other specified counseling: Secondary | ICD-10-CM | POA: Diagnosis not present

## 2024-08-05 MED ORDER — GLYCOPYRROLATE 0.2 MG/ML IJ SOLN
0.4000 mg | INTRAMUSCULAR | Status: DC
  Administered 2024-08-05 – 2024-08-07 (×12): 0.4 mg via INTRAVENOUS
  Filled 2024-08-05 (×12): qty 2

## 2024-08-05 NOTE — Progress Notes (Signed)
 Family does not want pt moved at the moment

## 2024-08-05 NOTE — Plan of Care (Signed)
  Problem: Pain Managment: Goal: General experience of comfort will improve and/or be controlled Outcome: Progressing

## 2024-08-05 NOTE — Progress Notes (Signed)
  Daily Progress Note   Date: 08/05/2024   Patient Name: Jonathan Baird  DOB: Apr 16, 1933  MRN: [id number]  Age / Sex: 89 y.o., male  Attending Physician: Jonathan Nilda HERO, MD Primary Care Physician: Jonathan Lombard, MD Admit Date: 07/30/2024 Length of Stay: 6 days  Reason for Follow-up: {Reason for Consult:23484}  Past Medical History:  Diagnosis Date   Carotid artery stenosis    Right   CKD (chronic kidney disease), stage IV (HCC)    Colon cancer (HCC) 10/12/2004   stage III, s/p hemicolectomy   Dementia (HCC)    Diabetes mellitus    HLD (hyperlipidemia)    HTN (hypertension)    Peripheral vascular disease     Subjective:   Subjective: Chart Reviewed. Updates received. Patient Assessed. Created space and opportunity for patient  and family to explore thoughts and feelings regarding current medical situation.  Today's Discussion: Today before meeting with the patient/family, I reviewed the chart notes including ***. I also reviewed vital signs, nursing flowsheets, and medication administrations record. No labs due to comfort care status.  Vital signs today include ***.  Comfort medications administered include ***.  ***  Review of Systems  Objective:   Primary Diagnoses: Present on Admission:  Sepsis (HCC)   Vital Signs:  BP (!) 128/56 (BP Location: Right Arm)   Pulse (!) 58   Temp 97.7 F (36.5 C) (Oral)   Resp 16   Ht 6' (1.829 m)   Wt 54.3 kg   SpO2 99%   BMI 16.24 kg/m   Physical Exam  Palliative Assessment/Data: ***   Existing Vynca/ACP Documentation: ***  Assessment & Plan:   HPI/Patient Profile:  ***  SUMMARY OF RECOMMENDATIONS   ***  Symptom Management:  ***  Code Status: {Updated Palliative Code Status:33307}  Prognosis: {Palliative Care Prognosis:23504}  Discharge Planning: {Palliative dispostion:23505}  Discussed with: ***  Thank you for allowing us  to participate in the care of Jonathan Baird PMT will continue to  support holistically.  Time Total: ***  Detailed review of medical records (labs, imaging, vital signs), medically appropriate exam, discussed with treatment team, counseling and education to patient, family, & staff, documenting clinical information, medication management, coordination of care  Jonathan Kays, NP Palliative Medicine Team  Team Phone # [phone] (Nights/Weekends)  07/11/2021, 8:17 AM

## 2024-08-05 NOTE — Progress Notes (Signed)
 PROGRESS NOTE  Jonathan Baird FMW:[id number] DOB: [birth year] DOA: 07/30/2024 PCP: Verdia Lombard, MD   LOS: 6 days   Brief Narrative / Interim history: 89 y.o. male with PMH significant for dementia, DM2, HTN, HLD, PVD, carotid artery stenosis, CKD, colon cancer 2005 status post hemicolectomy, severe protein calorie malnutrition, mostly bedbound for at least the last 3 months. 9/18, patient presented with altered mental status, poor oral intake, infected sacral wound. In the ER, had runs of VT then went into VT cardiac arrest, was resuscitated, intubated and admitted to ICU.  Subsequently found to have Proteus bacteremia.   Palliative care was consulted.  After a family meeting, patient was switched to comfort care status Extubated 9/21 with the hope that he would be stable for home hospice.  Unfortunately his respiratory status has  deteriorated.  He was started on fentanyl  drip for work of breathing.  9/23, transferred out to [facility]. Anticipated in-hospital death  Subjective / 24h Interval events: Patient is unresponsive.  Family at bedside sleeping  Assesement and Plan: Principal problem Comfort care status - Chronically sick with multiple comorbidities, severe malnutrition and persistent physical and mental decline. Admitted this time after a cardiac arrest.  Found to have Proteus bacteremia. After discussion with family, comfort measures were started. In-hospital mortality likely   Other medical issues Septic shock Proteus bacteremia  Aspiration PNA  Infected sacral decub  VT arrest Acute encephalopathy superimposed on baseline dementia Acute resp failure w hypoxia  Hx HTN PVD CAD CAS DM2 AKI on CKD IV  Bed bound state  Physical deconditioning  Anemia of chronic dz  Pressure injuries POA Severe protein calorie malnutrition Dysphagia   Scheduled Meds:  leptospermum manuka honey  1 Application Topical Daily   Continuous Infusions:  fentaNYL  infusion INTRAVENOUS 75  mcg/hr (08/05/24 0459)   PRN Meds:.artificial tears, fentaNYL , glycopyrrolate , haloperidol  lactate, midazolam , ondansetron  **OR** ondansetron  (ZOFRAN ) IV, mouth rinse  Current Outpatient Medications  Medication Instructions   aspirin  81 mg, Oral, Daily   gabapentin (NEURONTIN) 100 mg, Oral, 3 times daily   Gvoke HypoPen 2-Pack 1 mg, Subcutaneous, Once PRN   hydrALAZINE  (APRESOLINE ) 50 mg, Oral, 3 times daily   insulin  glargine (LANTUS ) 4 Units, Daily   insulin  lispro (HUMALOG) 4 Units, 3 times daily before meals   nutrition supplement, JUVEN, (JUVEN) PACK 1 packet, Oral, 2 times daily between meals   rivaroxaban  (XARELTO ) 2.5 mg, Oral, 2 times daily   sodium bicarbonate  650 mg, Oral, 3 times daily before meals    Diet Orders (From admission, onward)     Start     Ordered   08/02/24 1343  Diet NPO time specified  Diet effective now        08/02/24 1343            DVT prophylaxis: SCDs Start: 07/30/24 2035   Lab Results  Component Value Date   PLT 207 08/02/2024      Code Status: Do not attempt resuscitation (DNR) - Comfort care  Family Communication: Family at bedside  Status is: Inpatient Remains inpatient appropriate because: Anticipating hospital death   Level of care: Palliative Care  Consultants:  Palliative care PCCM  Objective: Vitals:   08/03/24 1600 08/03/24 1735 08/04/24 0500 08/05/24 0516  BP:  (!) 118/54 (!) 126/45 (!) 128/56  Pulse: 80 81 80 (!) 58  Resp: 18 16 12 16   Temp: 98.4 F (36.9 C) 97.8 F (36.6 C) 98.8 F (37.1 C) 97.7 F (36.5 C)  TempSrc:  Oral  Axillary Oral  SpO2: 91% 99% 100% 99%  Weight:      Height:        Intake/Output Summary (Last 24 hours) at 08/05/2024 0844 Last data filed at 08/04/2024 1200 Gross per 24 hour  Intake 0 ml  Output 100 ml  Net -100 ml   Wt Readings from Last 3 Encounters:  08/03/24 54.3 kg  06/04/24 68 kg  12/31/23 65.4 kg    Examination:  Constitutional: NAD  Data Reviewed: I have  independently reviewed following labs and imaging studies   CBC Recent Labs  Lab 07/30/24 1704 07/30/24 1714 07/30/24 1716 07/30/24 1815 08/01/24 1040 08/02/24 0238  WBC 38.4*  --   --   --  23.7* 20.5*  HGB 9.5* 10.2* 10.5* 8.2* 7.3* 6.7*  HCT 29.5* 30.0* 31.0* 24.0* 21.2* 19.7*  PLT 417*  --   --   --  225 207  MCV 79.1*  --   --   --  74.1* 74.9*  MCH 25.5*  --   --   --  25.5* 25.5*  MCHC 32.2  --   --   --  34.4 34.0  RDW 16.1*  --   --   --  15.4 15.5  LYMPHSABS 1.6  --   --   --   --   --   MONOABS 1.1*  --   --   --   --   --   EOSABS 0.0  --   --   --   --   --   BASOSABS 0.1  --   --   --   --   --     Recent Labs  Lab 07/30/24 1704 07/30/24 1714 07/30/24 1715 07/30/24 1716 07/30/24 1815 07/30/24 1849 07/30/24 1857 07/31/24 1945 08/01/24 0252 08/02/24 0238  NA 137 138  --  137 137  --   --  139 141 142  K 5.0 4.9  --  4.9 4.4  --   --  6.0* 5.3* 4.4  CL 102 108  --   --   --   --   --  104 103 105  CO2 13*  --   --   --   --   --   --  15* 18* 20*  GLUCOSE 397* 372*  --   --   --   --   --  201* 265* 277*  BUN 97* 100*  --   --   --   --   --  103* 107* 122*  CREATININE 4.63* 5.00*  --   --   --   --   --  5.23* 5.24* 5.82*  CALCIUM  9.2  --   --   --   --   --   --  8.6* 8.4* 8.4*  AST 51*  --   --   --   --   --   --   --   --   --   ALT 40  --   --   --   --   --   --   --   --   --   ALKPHOS 106  --   --   --   --   --   --   --   --   --   BILITOT 0.8  --   --   --   --   --   --   --   --   --  ALBUMIN 1.8*  --   --   --   --   --   --   --   --   --   MG 2.8*  --   --   --   --   --   --   --  2.3 2.5*  LATICACIDVEN  --   --  8.9*  --   --   --  >15.0*  --  2.6*  --   INR 1.3*  --   --   --   --   --   --   --   --   --   TSH  --   --   --   --   --  3.691  --   --   --   --     ------------------------------------------------------------------------------------------------------------------ No results for input(s): CHOL, HDL, LDLCALC,  TRIG, CHOLHDL, LDLDIRECT in the last 72 hours.  Lab Results  Component Value Date   HGBA1C 7.6 (H) 05/21/2024   ------------------------------------------------------------------------------------------------------------------ No results for input(s): TSH, T4TOTAL, T3FREE, THYROIDAB in the last 72 hours.  Invalid input(s): FREET3  Cardiac Enzymes No results for input(s): CKMB, TROPONINI, MYOGLOBIN in the last 168 hours.  Invalid input(s): CK ------------------------------------------------------------------------------------------------------------------    Component Value Date/Time   BNP 22.1 05/20/2024 1957    CBG: Recent Labs  Lab 08/01/24 1937 08/02/24 0006 08/02/24 0307 08/02/24 0725 08/02/24 1121  GLUCAP 286* 219* 262* 242* 211*    Recent Results (from the past 240 hours)  Blood Culture (routine x 2)     Status: Abnormal   Collection Time: 07/30/24  5:03 PM   Specimen: BLOOD  Result Value Ref Range Status   Specimen Description BLOOD SITE NOT SPECIFIED  Final   Special Requests   Final    BOTTLES DRAWN AEROBIC AND ANAEROBIC Blood Culture adequate volume   Culture  Setup Time   Final    GRAM NEGATIVE RODS IN BOTH AEROBIC AND ANAEROBIC BOTTLES CRITICAL RESULT CALLED TO, READ BACK BY AND VERIFIED WITH: PHARMD JESSICA CARNEY [id number] AT 1716 BY EC Performed at [facility], [address] [place], [place] [address]    Culture PROTEUS MIRABILIS (A)  Final   Report Status 08/02/2024 FINAL  Final   Organism ID, Bacteria PROTEUS MIRABILIS  Final      Susceptibility   Proteus mirabilis - MIC*    AMPICILLIN 4 SENSITIVE Sensitive     CEFAZOLIN (NON-URINE) 4 INTERMEDIATE Intermediate     CEFEPIME  <=0.12 SENSITIVE Sensitive     ERTAPENEM <=0.12 SENSITIVE Sensitive     CEFTRIAXONE  <=0.25 SENSITIVE Sensitive     CIPROFLOXACIN <=0.06 SENSITIVE Sensitive     GENTAMICIN <=1 SENSITIVE Sensitive     MEROPENEM 1 SENSITIVE Sensitive      TRIMETH/SULFA <=20 SENSITIVE Sensitive     AMPICILLIN/SULBACTAM <=2 SENSITIVE Sensitive     PIP/TAZO Value in next row Sensitive      <=4 SENSITIVEThis is a modified FDA-approved test that has been validated and its performance characteristics determined by the reporting laboratory.  This laboratory is certified under the Clinical Laboratory Improvement Amendments CLIA as qualified to perform high complexity clinical laboratory testing.    * PROTEUS MIRABILIS  Blood Culture ID Panel (Reflexed)     Status: Abnormal   Collection Time: 07/30/24  5:03 PM  Result Value Ref Range Status   Enterococcus faecalis NOT DETECTED NOT DETECTED Final   Enterococcus Faecium NOT DETECTED NOT DETECTED Final   Listeria  monocytogenes NOT DETECTED NOT DETECTED Final   Staphylococcus species NOT DETECTED NOT DETECTED Final   Staphylococcus aureus (BCID) NOT DETECTED NOT DETECTED Final   Staphylococcus epidermidis NOT DETECTED NOT DETECTED Final   Staphylococcus lugdunensis NOT DETECTED NOT DETECTED Final   Streptococcus species NOT DETECTED NOT DETECTED Final   Streptococcus agalactiae NOT DETECTED NOT DETECTED Final   Streptococcus pneumoniae NOT DETECTED NOT DETECTED Final   Streptococcus pyogenes NOT DETECTED NOT DETECTED Final   A.calcoaceticus-baumannii NOT DETECTED NOT DETECTED Final   Bacteroides fragilis NOT DETECTED NOT DETECTED Final   Enterobacterales DETECTED (A) NOT DETECTED Final    Comment: Enterobacterales represent a large order of gram negative bacteria, not a single organism. CRITICAL RESULT CALLED TO, READ BACK BY AND VERIFIED WITH: PHARMD JESSICA CARNEY [id number] AT 1716 BY EC    Enterobacter cloacae complex NOT DETECTED NOT DETECTED Final   Escherichia coli NOT DETECTED NOT DETECTED Final   Klebsiella aerogenes NOT DETECTED NOT DETECTED Final   Klebsiella oxytoca NOT DETECTED NOT DETECTED Final   Klebsiella pneumoniae NOT DETECTED NOT DETECTED Final   Proteus species DETECTED (A) NOT  DETECTED Final    Comment: CRITICAL RESULT CALLED TO, READ BACK BY AND VERIFIED WITH: PHARMD JESSICA CARNEY [id number] AT 1716 BY EC    Salmonella species NOT DETECTED NOT DETECTED Final   Serratia marcescens NOT DETECTED NOT DETECTED Final   Haemophilus influenzae NOT DETECTED NOT DETECTED Final   Neisseria meningitidis NOT DETECTED NOT DETECTED Final   Pseudomonas aeruginosa NOT DETECTED NOT DETECTED Final   Stenotrophomonas maltophilia NOT DETECTED NOT DETECTED Final   Candida albicans NOT DETECTED NOT DETECTED Final   Candida auris NOT DETECTED NOT DETECTED Final   Candida glabrata NOT DETECTED NOT DETECTED Final   Candida krusei NOT DETECTED NOT DETECTED Final   Candida parapsilosis NOT DETECTED NOT DETECTED Final   Candida tropicalis NOT DETECTED NOT DETECTED Final   Cryptococcus neoformans/gattii NOT DETECTED NOT DETECTED Final   CTX-M ESBL NOT DETECTED NOT DETECTED Final   Carbapenem resistance IMP NOT DETECTED NOT DETECTED Final   Carbapenem resistance KPC NOT DETECTED NOT DETECTED Final   Carbapenem resistance NDM NOT DETECTED NOT DETECTED Final   Carbapenem resist OXA 48 LIKE NOT DETECTED NOT DETECTED Final   Carbapenem resistance VIM NOT DETECTED NOT DETECTED Final    Comment: Performed at [facility], [address], [place], [place] [address]  Blood Culture (routine x 2)     Status: None (Preliminary result)   Collection Time: 07/30/24  5:04 PM   Specimen: BLOOD  Result Value Ref Range Status   Specimen Description BLOOD SITE NOT SPECIFIED  Final   Special Requests   Final    BOTTLES DRAWN AEROBIC AND ANAEROBIC Blood Culture adequate volume   Culture  Setup Time   Final    GRAM POSITIVE RODS ANAEROBIC BOTTLE ONLY CRITICAL RESULT CALLED TO, READ BACK BY AND VERIFIED WITH: MAYA CHARLENA FAIL [id number] FCP Performed at [facility], [address] [place], [place] [address]    Culture   Final    GRAM POSITIVE RODS CORRECTED ON 09/23 AT 0818: PREVIOUSLY  REPORTED AS NO GROWTH 5 DAYS   Report Status PENDING  Incomplete  Urine Culture     Status: Abnormal   Collection Time: 07/30/24  7:50 PM   Specimen: Urine, Clean Catch  Result Value Ref Range Status   Specimen Description URINE, CLEAN CATCH  Final   Special Requests   Final  NONE Reflexed from [id number] Performed at [facility], [address] [place], [place] [address]    Culture >=100,000 COLONIES/mL PROTEUS MIRABILIS (A)  Final   Report Status 08/02/2024 FINAL  Final   Organism ID, Bacteria PROTEUS MIRABILIS (A)  Final      Susceptibility   Proteus mirabilis - MIC*    AMPICILLIN 4 SENSITIVE Sensitive     CEFAZOLIN (URINE) Value in next row Sensitive      4 SENSITIVEThis is a modified FDA-approved test that has been validated and its performance characteristics determined by the reporting laboratory.  This laboratory is certified under the Clinical Laboratory Improvement Amendments CLIA as qualified to perform high complexity clinical laboratory testing.    CEFEPIME  Value in next row Sensitive      4 SENSITIVEThis is a modified FDA-approved test that has been validated and its performance characteristics determined by the reporting laboratory.  This laboratory is certified under the Clinical Laboratory Improvement Amendments CLIA as qualified to perform high complexity clinical laboratory testing.    ERTAPENEM Value in next row Sensitive      4 SENSITIVEThis is a modified FDA-approved test that has been validated and its performance characteristics determined by the reporting laboratory.  This laboratory is certified under the Clinical Laboratory Improvement Amendments CLIA as qualified to perform high complexity clinical laboratory testing.    CEFTRIAXONE  Value in next row Sensitive      4 SENSITIVEThis is a modified FDA-approved test that has been validated and its performance characteristics determined by the reporting laboratory.  This laboratory is certified under the Clinical  Laboratory Improvement Amendments CLIA as qualified to perform high complexity clinical laboratory testing.    CIPROFLOXACIN Value in next row Sensitive      4 SENSITIVEThis is a modified FDA-approved test that has been validated and its performance characteristics determined by the reporting laboratory.  This laboratory is certified under the Clinical Laboratory Improvement Amendments CLIA as qualified to perform high complexity clinical laboratory testing.    GENTAMICIN Value in next row Sensitive      4 SENSITIVEThis is a modified FDA-approved test that has been validated and its performance characteristics determined by the reporting laboratory.  This laboratory is certified under the Clinical Laboratory Improvement Amendments CLIA as qualified to perform high complexity clinical laboratory testing.    NITROFURANTOIN Value in next row Resistant      4 SENSITIVEThis is a modified FDA-approved test that has been validated and its performance characteristics determined by the reporting laboratory.  This laboratory is certified under the Clinical Laboratory Improvement Amendments CLIA as qualified to perform high complexity clinical laboratory testing.    TRIMETH/SULFA Value in next row Sensitive      4 SENSITIVEThis is a modified FDA-approved test that has been validated and its performance characteristics determined by the reporting laboratory.  This laboratory is certified under the Clinical Laboratory Improvement Amendments CLIA as qualified to perform high complexity clinical laboratory testing.    AMPICILLIN/SULBACTAM Value in next row Resistant      4 SENSITIVEThis is a modified FDA-approved test that has been validated and its performance characteristics determined by the reporting laboratory.  This laboratory is certified under the Clinical Laboratory Improvement Amendments CLIA as qualified to perform high complexity clinical laboratory testing.    PIP/TAZO Value in next row Sensitive      <=4  SENSITIVEThis is a modified FDA-approved test that has been validated and its performance characteristics determined by the reporting laboratory.  This  laboratory is certified under the Clinical Laboratory Improvement Amendments CLIA as qualified to perform high complexity clinical laboratory testing.    MEROPENEM Value in next row Intermediate      <=4 SENSITIVEThis is a modified FDA-approved test that has been validated and its performance characteristics determined by the reporting laboratory.  This laboratory is certified under the Clinical Laboratory Improvement Amendments CLIA as qualified to perform high complexity clinical laboratory testing.    * >=100,000 COLONIES/mL PROTEUS MIRABILIS  MRSA Next Gen by PCR, Nasal     Status: None   Collection Time: 07/30/24  9:42 PM   Specimen: Nasal Mucosa; Nasal Swab  Result Value Ref Range Status   MRSA by PCR Next Gen NOT DETECTED NOT DETECTED Final    Comment: (NOTE) The GeneXpert MRSA Assay (FDA approved for NASAL specimens only), is one component of a comprehensive MRSA colonization surveillance program. It is not intended to diagnose MRSA infection nor to guide or monitor treatment for MRSA infections. Test performance is not FDA approved in patients less than 78 years old. Performed at [facility], [address] [place], [place] [address]      Radiology Studies: No results found.   Nilda Fendt, MD, PhD [facility]  Between 7 am - 7 pm I am available, please contact me via Amion (for emergencies) or Securechat (non urgent messages)  Between 7 pm - 7 am I am not available, please contact night coverage MD/APP via Amion

## 2024-08-06 DIAGNOSIS — Z515 Encounter for palliative care: Secondary | ICD-10-CM | POA: Diagnosis not present

## 2024-08-06 DIAGNOSIS — N170 Acute kidney failure with tubular necrosis: Secondary | ICD-10-CM | POA: Diagnosis not present

## 2024-08-06 DIAGNOSIS — A419 Sepsis, unspecified organism: Secondary | ICD-10-CM | POA: Diagnosis not present

## 2024-08-06 DIAGNOSIS — R6520 Severe sepsis without septic shock: Secondary | ICD-10-CM | POA: Diagnosis not present

## 2024-08-06 DIAGNOSIS — Z66 Do not resuscitate: Secondary | ICD-10-CM | POA: Diagnosis not present

## 2024-08-06 DIAGNOSIS — Z7189 Other specified counseling: Secondary | ICD-10-CM | POA: Diagnosis not present

## 2024-08-06 NOTE — Progress Notes (Signed)
 This chaplain is present for F/U spiritual care with the Pt. daughter-Nicole. Family is not at the bedside at the time of the visit. The chaplain will continue to partner with [facility] in providing grief care for [facility].  This chaplain is available for F/U spiritual care as needed.  Chaplain Leeroy Hummer [phone]

## 2024-08-06 NOTE — Progress Notes (Signed)
 PROGRESS NOTE  Jonathan Baird FMW:[id number] DOB: 07-04-1933 DOA: 07/30/2024 PCP: Verdia Lombard, MD   LOS: 7 days   Brief Narrative / Interim history: 89 y.o. male with PMH significant for dementia, DM2, HTN, HLD, PVD, carotid artery stenosis, CKD, colon cancer 2005 status post hemicolectomy, severe protein calorie malnutrition, mostly bedbound for at least the last 3 months. 9/18, patient presented with altered mental status, poor oral intake, infected sacral wound. In the ER, had runs of VT then went into VT cardiac arrest, was resuscitated, intubated and admitted to ICU.  Subsequently found to have Proteus bacteremia.   Palliative care was consulted.  After a family meeting, patient was switched to comfort care status Extubated 9/21 with the hope that he would be stable for home hospice.  Unfortunately his respiratory status has  deteriorated.  He was started on fentanyl  drip for work of breathing.  9/23, transferred out to [facility]. Anticipated in-hospital death  Subjective / 24h Interval events: Patient remains unresponsive.  Daughter is at bedside, asleep  Assesement and Plan: Principal problem Comfort care status - Chronically sick with multiple comorbidities, severe malnutrition and persistent physical and mental decline. Admitted this time after a cardiac arrest.  Found to have Proteus bacteremia. After discussion with family, comfort measures were started. In-hospital mortality likely   Other medical issues Septic shock Proteus bacteremia  Aspiration PNA  Infected sacral decub  VT arrest Acute encephalopathy superimposed on baseline dementia Acute resp failure w hypoxia  Hx HTN PVD CAD CAS DM2 AKI on CKD IV  Bed bound state  Physical deconditioning  Anemia of chronic dz  Pressure injuries POA Severe protein calorie malnutrition Dysphagia   Scheduled Meds:  glycopyrrolate   0.4 mg Intravenous Q4H   leptospermum manuka honey  1 Application Topical Daily   Continuous  Infusions:  fentaNYL  infusion INTRAVENOUS 75 mcg/hr (08/05/24 0459)   PRN Meds:.artificial tears, fentaNYL , haloperidol  lactate, midazolam , ondansetron  **OR** ondansetron  (ZOFRAN ) IV, mouth rinse  Current Outpatient Medications  Medication Instructions   aspirin  81 mg, Oral, Daily   gabapentin (NEURONTIN) 100 mg, Oral, 3 times daily   Gvoke HypoPen 2-Pack 1 mg, Subcutaneous, Once PRN   hydrALAZINE  (APRESOLINE ) 50 mg, Oral, 3 times daily   insulin  glargine (LANTUS ) 4 Units, Daily   insulin  lispro (HUMALOG) 4 Units, 3 times daily before meals   nutrition supplement, JUVEN, (JUVEN) PACK 1 packet, Oral, 2 times daily between meals   rivaroxaban  (XARELTO ) 2.5 mg, Oral, 2 times daily   sodium bicarbonate  650 mg, Oral, 3 times daily before meals    Diet Orders (From admission, onward)     Start     Ordered   08/02/24 1343  Diet NPO time specified  Diet effective now        08/02/24 1343            DVT prophylaxis: SCDs Start: 07/30/24 2035   Lab Results  Component Value Date   PLT 207 08/02/2024      Code Status: Do not attempt resuscitation (DNR) - Comfort care  Family Communication: Family at bedside  Status is: Inpatient Remains inpatient appropriate because: Anticipating hospital death   Level of care: Palliative Care  Consultants:  Palliative care PCCM  Objective: Vitals:   08/03/24 1600 08/03/24 1735 08/04/24 0500 08/05/24 0516  BP:  (!) 118/54 (!) 126/45 (!) 128/56  Pulse: 80 81 80 (!) 58  Resp: 18 16 12 16   Temp: 98.4 F (36.9 C) 97.8 F (36.6 C) 98.8 F (37.1 C)  97.7 F (36.5 C)  TempSrc:  Oral Axillary Oral  SpO2: 91% 99% 100% 99%  Weight:      Height:        Intake/Output Summary (Last 24 hours) at 08/06/2024 0938 Last data filed at 08/06/2024 0800 Gross per 24 hour  Intake 0 ml  Output 0 ml  Net 0 ml   Wt Readings from Last 3 Encounters:  08/03/24 54.3 kg  06/04/24 68 kg  12/31/23 65.4 kg    Examination:  Constitutional: No  distress  Data Reviewed: I have independently reviewed following labs and imaging studies   CBC Recent Labs  Lab 07/30/24 1704 07/30/24 1714 07/30/24 1716 07/30/24 1815 08/01/24 1040 08/02/24 0238  WBC 38.4*  --   --   --  23.7* 20.5*  HGB 9.5* 10.2* 10.5* 8.2* 7.3* 6.7*  HCT 29.5* 30.0* 31.0* 24.0* 21.2* 19.7*  PLT 417*  --   --   --  225 207  MCV 79.1*  --   --   --  74.1* 74.9*  MCH 25.5*  --   --   --  25.5* 25.5*  MCHC 32.2  --   --   --  34.4 34.0  RDW 16.1*  --   --   --  15.4 15.5  LYMPHSABS 1.6  --   --   --   --   --   MONOABS 1.1*  --   --   --   --   --   EOSABS 0.0  --   --   --   --   --   BASOSABS 0.1  --   --   --   --   --     Recent Labs  Lab 07/30/24 1704 07/30/24 1714 07/30/24 1715 07/30/24 1716 07/30/24 1815 07/30/24 1849 07/30/24 1857 07/31/24 1945 08/01/24 0252 08/02/24 0238  NA 137 138  --  137 137  --   --  139 141 142  K 5.0 4.9  --  4.9 4.4  --   --  6.0* 5.3* 4.4  CL 102 108  --   --   --   --   --  104 103 105  CO2 13*  --   --   --   --   --   --  15* 18* 20*  GLUCOSE 397* 372*  --   --   --   --   --  201* 265* 277*  BUN 97* 100*  --   --   --   --   --  103* 107* 122*  CREATININE 4.63* 5.00*  --   --   --   --   --  5.23* 5.24* 5.82*  CALCIUM  9.2  --   --   --   --   --   --  8.6* 8.4* 8.4*  AST 51*  --   --   --   --   --   --   --   --   --   ALT 40  --   --   --   --   --   --   --   --   --   ALKPHOS 106  --   --   --   --   --   --   --   --   --   BILITOT 0.8  --   --   --   --   --   --   --   --   --  ALBUMIN 1.8*  --   --   --   --   --   --   --   --   --   MG 2.8*  --   --   --   --   --   --   --  2.3 2.5*  LATICACIDVEN  --   --  8.9*  --   --   --  >15.0*  --  2.6*  --   INR 1.3*  --   --   --   --   --   --   --   --   --   TSH  --   --   --   --   --  3.691  --   --   --   --     ------------------------------------------------------------------------------------------------------------------ No results for  input(s): CHOL, HDL, LDLCALC, TRIG, CHOLHDL, LDLDIRECT in the last 72 hours.  Lab Results  Component Value Date   HGBA1C 7.6 (H) 05/21/2024   ------------------------------------------------------------------------------------------------------------------ No results for input(s): TSH, T4TOTAL, T3FREE, THYROIDAB in the last 72 hours.  Invalid input(s): FREET3  Cardiac Enzymes No results for input(s): CKMB, TROPONINI, MYOGLOBIN in the last 168 hours.  Invalid input(s): CK ------------------------------------------------------------------------------------------------------------------    Component Value Date/Time   BNP 22.1 05/20/2024 1957    CBG: Recent Labs  Lab 08/01/24 1937 08/02/24 0006 08/02/24 0307 08/02/24 0725 08/02/24 1121  GLUCAP 286* 219* 262* 242* 211*    Recent Results (from the past 240 hours)  Blood Culture (routine x 2)     Status: Abnormal   Collection Time: 07/30/24  5:03 PM   Specimen: BLOOD  Result Value Ref Range Status   Specimen Description BLOOD SITE NOT SPECIFIED  Final   Special Requests   Final    BOTTLES DRAWN AEROBIC AND ANAEROBIC Blood Culture adequate volume   Culture  Setup Time   Final    GRAM NEGATIVE RODS IN BOTH AEROBIC AND ANAEROBIC BOTTLES CRITICAL RESULT CALLED TO, READ BACK BY AND VERIFIED WITH: PHARMD JESSICA CARNEY [id number] AT 1716 BY EC Performed at [facility], [address] [place], [place] [address]    Culture PROTEUS MIRABILIS (A)  Final   Report Status 08/02/2024 FINAL  Final   Organism ID, Bacteria PROTEUS MIRABILIS  Final      Susceptibility   Proteus mirabilis - MIC*    AMPICILLIN 4 SENSITIVE Sensitive     CEFAZOLIN (NON-URINE) 4 INTERMEDIATE Intermediate     CEFEPIME  <=0.12 SENSITIVE Sensitive     ERTAPENEM <=0.12 SENSITIVE Sensitive     CEFTRIAXONE  <=0.25 SENSITIVE Sensitive     CIPROFLOXACIN <=0.06 SENSITIVE Sensitive     GENTAMICIN <=1 SENSITIVE Sensitive      MEROPENEM 1 SENSITIVE Sensitive     TRIMETH/SULFA <=20 SENSITIVE Sensitive     AMPICILLIN/SULBACTAM <=2 SENSITIVE Sensitive     PIP/TAZO Value in next row Sensitive      <=4 SENSITIVEThis is a modified FDA-approved test that has been validated and its performance characteristics determined by the reporting laboratory.  This laboratory is certified under the Clinical Laboratory Improvement Amendments CLIA as qualified to perform high complexity clinical laboratory testing.    * PROTEUS MIRABILIS  Blood Culture ID Panel (Reflexed)     Status: Abnormal   Collection Time: 07/30/24  5:03 PM  Result Value Ref Range Status   Enterococcus faecalis NOT DETECTED NOT DETECTED Final   Enterococcus Faecium NOT DETECTED NOT DETECTED Final   Listeria  monocytogenes NOT DETECTED NOT DETECTED Final   Staphylococcus species NOT DETECTED NOT DETECTED Final   Staphylococcus aureus (BCID) NOT DETECTED NOT DETECTED Final   Staphylococcus epidermidis NOT DETECTED NOT DETECTED Final   Staphylococcus lugdunensis NOT DETECTED NOT DETECTED Final   Streptococcus species NOT DETECTED NOT DETECTED Final   Streptococcus agalactiae NOT DETECTED NOT DETECTED Final   Streptococcus pneumoniae NOT DETECTED NOT DETECTED Final   Streptococcus pyogenes NOT DETECTED NOT DETECTED Final   A.calcoaceticus-baumannii NOT DETECTED NOT DETECTED Final   Bacteroides fragilis NOT DETECTED NOT DETECTED Final   Enterobacterales DETECTED (A) NOT DETECTED Final    Comment: Enterobacterales represent a large order of gram negative bacteria, not a single organism. CRITICAL RESULT CALLED TO, READ BACK BY AND VERIFIED WITH: PHARMD JESSICA CARNEY [id number] AT 1716 BY EC    Enterobacter cloacae complex NOT DETECTED NOT DETECTED Final   Escherichia coli NOT DETECTED NOT DETECTED Final   Klebsiella aerogenes NOT DETECTED NOT DETECTED Final   Klebsiella oxytoca NOT DETECTED NOT DETECTED Final   Klebsiella pneumoniae NOT DETECTED NOT DETECTED Final    Proteus species DETECTED (A) NOT DETECTED Final    Comment: CRITICAL RESULT CALLED TO, READ BACK BY AND VERIFIED WITH: PHARMD JESSICA CARNEY [id number] AT 1716 BY EC    Salmonella species NOT DETECTED NOT DETECTED Final   Serratia marcescens NOT DETECTED NOT DETECTED Final   Haemophilus influenzae NOT DETECTED NOT DETECTED Final   Neisseria meningitidis NOT DETECTED NOT DETECTED Final   Pseudomonas aeruginosa NOT DETECTED NOT DETECTED Final   Stenotrophomonas maltophilia NOT DETECTED NOT DETECTED Final   Candida albicans NOT DETECTED NOT DETECTED Final   Candida auris NOT DETECTED NOT DETECTED Final   Candida glabrata NOT DETECTED NOT DETECTED Final   Candida krusei NOT DETECTED NOT DETECTED Final   Candida parapsilosis NOT DETECTED NOT DETECTED Final   Candida tropicalis NOT DETECTED NOT DETECTED Final   Cryptococcus neoformans/gattii NOT DETECTED NOT DETECTED Final   CTX-M ESBL NOT DETECTED NOT DETECTED Final   Carbapenem resistance IMP NOT DETECTED NOT DETECTED Final   Carbapenem resistance KPC NOT DETECTED NOT DETECTED Final   Carbapenem resistance NDM NOT DETECTED NOT DETECTED Final   Carbapenem resist OXA 48 LIKE NOT DETECTED NOT DETECTED Final   Carbapenem resistance VIM NOT DETECTED NOT DETECTED Final    Comment: Performed at [facility], [address] [place], [place] [address]  Blood Culture (routine x 2)     Status: None (Preliminary result)   Collection Time: 07/30/24  5:04 PM   Specimen: BLOOD  Result Value Ref Range Status   Specimen Description BLOOD SITE NOT SPECIFIED  Final   Special Requests   Final    BOTTLES DRAWN AEROBIC AND ANAEROBIC Blood Culture adequate volume   Culture  Setup Time   Final    GRAM POSITIVE RODS ANAEROBIC BOTTLE ONLY CRITICAL RESULT CALLED TO, READ BACK BY AND VERIFIED WITH: MAYA CHARLENA FAIL [id number] FCP Performed at [facility], [address], [place], [place] [address]    Culture   Final    GRAM POSITIVE RODS  CORRECTED ON 09/23 AT 0818: PREVIOUSLY REPORTED AS NO GROWTH 5 DAYS   Report Status PENDING  Incomplete  Urine Culture     Status: Abnormal   Collection Time: 07/30/24  7:50 PM   Specimen: Urine, Clean Catch  Result Value Ref Range Status   Specimen Description URINE, CLEAN CATCH  Final   Special Requests   Final  NONE Reflexed from [id number] Performed at [facility], [address], [place], [place] [address]    Culture >=100,000 COLONIES/mL PROTEUS MIRABILIS (A)  Final   Report Status 08/02/2024 FINAL  Final   Organism ID, Bacteria PROTEUS MIRABILIS (A)  Final      Susceptibility   Proteus mirabilis - MIC*    AMPICILLIN 4 SENSITIVE Sensitive     CEFAZOLIN (URINE) Value in next row Sensitive      4 SENSITIVEThis is a modified FDA-approved test that has been validated and its performance characteristics determined by the reporting laboratory.  This laboratory is certified under the Clinical Laboratory Improvement Amendments CLIA as qualified to perform high complexity clinical laboratory testing.    CEFEPIME  Value in next row Sensitive      4 SENSITIVEThis is a modified FDA-approved test that has been validated and its performance characteristics determined by the reporting laboratory.  This laboratory is certified under the Clinical Laboratory Improvement Amendments CLIA as qualified to perform high complexity clinical laboratory testing.    ERTAPENEM Value in next row Sensitive      4 SENSITIVEThis is a modified FDA-approved test that has been validated and its performance characteristics determined by the reporting laboratory.  This laboratory is certified under the Clinical Laboratory Improvement Amendments CLIA as qualified to perform high complexity clinical laboratory testing.    CEFTRIAXONE  Value in next row Sensitive      4 SENSITIVEThis is a modified FDA-approved test that has been validated and its performance characteristics determined by the reporting laboratory.  This  laboratory is certified under the Clinical Laboratory Improvement Amendments CLIA as qualified to perform high complexity clinical laboratory testing.    CIPROFLOXACIN Value in next row Sensitive      4 SENSITIVEThis is a modified FDA-approved test that has been validated and its performance characteristics determined by the reporting laboratory.  This laboratory is certified under the Clinical Laboratory Improvement Amendments CLIA as qualified to perform high complexity clinical laboratory testing.    GENTAMICIN Value in next row Sensitive      4 SENSITIVEThis is a modified FDA-approved test that has been validated and its performance characteristics determined by the reporting laboratory.  This laboratory is certified under the Clinical Laboratory Improvement Amendments CLIA as qualified to perform high complexity clinical laboratory testing.    NITROFURANTOIN Value in next row Resistant      4 SENSITIVEThis is a modified FDA-approved test that has been validated and its performance characteristics determined by the reporting laboratory.  This laboratory is certified under the Clinical Laboratory Improvement Amendments CLIA as qualified to perform high complexity clinical laboratory testing.    TRIMETH/SULFA Value in next row Sensitive      4 SENSITIVEThis is a modified FDA-approved test that has been validated and its performance characteristics determined by the reporting laboratory.  This laboratory is certified under the Clinical Laboratory Improvement Amendments CLIA as qualified to perform high complexity clinical laboratory testing.    AMPICILLIN/SULBACTAM Value in next row Resistant      4 SENSITIVEThis is a modified FDA-approved test that has been validated and its performance characteristics determined by the reporting laboratory.  This laboratory is certified under the Clinical Laboratory Improvement Amendments CLIA as qualified to perform high complexity clinical laboratory testing.     PIP/TAZO Value in next row Sensitive      <=4 SENSITIVEThis is a modified FDA-approved test that has been validated and its performance characteristics determined by the reporting laboratory.  This  laboratory is certified under the Clinical Laboratory Improvement Amendments CLIA as qualified to perform high complexity clinical laboratory testing.    MEROPENEM Value in next row Intermediate      <=4 SENSITIVEThis is a modified FDA-approved test that has been validated and its performance characteristics determined by the reporting laboratory.  This laboratory is certified under the Clinical Laboratory Improvement Amendments CLIA as qualified to perform high complexity clinical laboratory testing.    * >=100,000 COLONIES/mL PROTEUS MIRABILIS  MRSA Next Gen by PCR, Nasal     Status: None   Collection Time: 07/30/24  9:42 PM   Specimen: Nasal Mucosa; Nasal Swab  Result Value Ref Range Status   MRSA by PCR Next Gen NOT DETECTED NOT DETECTED Final    Comment: (NOTE) The GeneXpert MRSA Assay (FDA approved for NASAL specimens only), is one component of a comprehensive MRSA colonization surveillance program. It is not intended to diagnose MRSA infection nor to guide or monitor treatment for MRSA infections. Test performance is not FDA approved in patients less than 52 years old. Performed at [facility], [address] [place], [place] [address]      Radiology Studies: No results found.   Nilda Fendt, MD, PhD [facility]  Between 7 am - 7 pm I am available, please contact me via Amion (for emergencies) or Securechat (non urgent messages)  Between 7 pm - 7 am I am not available, please contact night coverage MD/APP via Amion

## 2024-08-06 NOTE — Progress Notes (Signed)
 Daily Progress Note   Date: 08/06/2024   Patient Name: Jonathan Baird  DOB: 1933-06-20  MRN: [id number]  Age / Sex: 89 y.o., male  Attending Physician: Trixie Nilda HERO, MD Primary Care Physician: Verdia Lombard, MD Admit Date: 07/30/2024 Length of Stay: 7 days  Reason for Follow-up: Non pain symptom management, Pain control, and Terminal Care  Past Medical History:  Diagnosis Date   Carotid artery stenosis    Right   CKD (chronic kidney disease), stage IV (HCC)    Colon cancer (HCC) 10/12/2004   stage III, s/p hemicolectomy   Dementia (HCC)    Diabetes mellitus    HLD (hyperlipidemia)    HTN (hypertension)    Peripheral vascular disease     Subjective:   Subjective: Chart Reviewed. Updates received. Patient Assessed. Created space and opportunity for patient  and family to explore thoughts and feelings regarding current medical situation.  Today's Discussion: Today before meeting with the patient/family, I reviewed the chart notes including nursing note from yesterday, hospitalist note from today.  Later today I reviewed chaplain note from today. I also reviewed vital signs, nursing flowsheets, and medication administrations record. No labs due to comfort care status.  Comfort medications administered include continued fentanyl  drip at 75 mcg/min, no fentanyl  boluses in the past 24 hours.  He is receiving Robinul  scheduled every 4 hours, no Haldol  in the past 24 hours nor Zofran  the past 24 hours.  He did receive 2 mg of Versed  yesterday at 10:38 AM for terminal agitation.  Will continue availability of parenteral opioids and ongoing fentanyl  infusion due to actual and anticipated symptom needs.  Today saw the patient at bedside, no family was present.  The patient appears to be actively dying at this time.  His counted respirations were 10 breaths/min, although he appears comfortable.  He is not having increased work of breathing, grimacing, furrowing of brow, or other  objective signs of distress or discomfort.  He does have occasional apneic pauses and his respiratory pattern is a bit irregular.  Overall terminal secretions are significantly improved compared to yesterday.  Palliative medicine will continue to follow along while the patient is admitted inpatient with anticipated hospital death.  Review of Systems  Unable to perform ROS   Objective:   Primary Diagnoses: Present on Admission:  Sepsis (HCC)   Vital Signs:  BP (!) 128/56 (BP Location: Right Arm)   Pulse (!) 58   Temp 97.7 F (36.5 C) (Oral)   Resp 16   Ht 6' (1.829 m)   Wt 54.3 kg   SpO2 99%   BMI 16.24 kg/m   Physical Exam Nursing note reviewed.  Constitutional:      General: He is not in acute distress. HENT:     Head: Normocephalic and atraumatic.  Cardiovascular:     Rate and Rhythm: Normal rate.  Pulmonary:     Effort: Pulmonary effort is normal. No respiratory distress.     Comments: RR 10 breaths/min, apneic pauses noted Abdominal:     General: Abdomen is flat.  Skin:    General: Skin is warm and dry.  Neurological:     Mental Status: He is unresponsive.     Palliative Assessment/Data: 10%   Existing Vynca/ACP Documentation: None  Assessment & Plan:   HPI/Patient Profile:  89 y.o. male  with past medical history of HTN, dyslipidemia, dementia, PVD, DM-2, CKD-IV, right RCA stenosis, carotid stenosis on anticoagulation and ASA, colon cancer s/p hemicolectomy, bed bound since last  admission May 22, 2024 (respiratory distress, managed conservatively SBO, aspiration pneumonia, witnessed seizure, AMS, and septic shock), anemia of chronic illness, heels and sacral bedsore, severe PCM, and dysphagia (modified diet), who was brought to ED tonight due to poor oral intake, AMS, and lethargy for 2 days. He is refusing to eat over the past 48 hours.  He was admitted on 07/30/2024 with sepsis due to infected sacral bedsore, possible aspiration pneumonia, VT cardiac  arrest with ROSC after 6 minutes, lactic acidosis/AGMA due to sepsis and cardiac arrest, dementia, bedbound status for 2.5 months, poor functional status, and others.   Palliative medicine was consulted for GOC conversations.  SUMMARY OF RECOMMENDATIONS   DNR-comfort Continue comfort care See symptom management orders below Ongoing emotional support of patient's family Palliative medicine will continue to follow while inpatient  Symptom Management:  Artificial tears 1 drop OU 4 times daily as needed dry eyes Fentanyl  infusion 0 to 400 mcg/h titrate per instructions Fentanyl  bolus via infusion 100 mcg IV every 15 minutes as needed uncontrolled pain, distress, tachypnea Robinul  0.4 mg IV every 4 hours scheduled for secretions Haldol  2.5 to 5 mg IV every 4 hours as needed agitation or psychosis Versed  2 to 4 mg IV every 4 hours as needed agitation, sedation, anxiety, seizures Zofran  4 mg IV every 6 hours as needed nausea  Code Status: DNR - Comfort  Prognosis: Hours - Days  Discharge Planning: Anticipated Hospital Death  Discussed with: Medical team, nursing team  Thank you for allowing us  to participate in the care of [name] PMT will continue to support holistically.  Billing based on MDM: High  Problems Addressed: One acute or chronic illness or injury that poses a threat to life or bodily function  Risks: Parenteral controlled substances  Detailed review of medical records (labs, imaging, vital signs), medically appropriate exam, discussed with treatment team, counseling and education to patient, family, & staff, documenting clinical information, medication management, coordination of care  Camellia Kays, NP Palliative Medicine Team  Team Phone # [phone] (Nights/Weekends)  07/11/2021, 8:17 AM

## 2024-08-07 DIAGNOSIS — Z515 Encounter for palliative care: Secondary | ICD-10-CM | POA: Diagnosis not present

## 2024-08-07 DIAGNOSIS — R4589 Other symptoms and signs involving emotional state: Secondary | ICD-10-CM | POA: Diagnosis not present

## 2024-08-07 LAB — CULTURE, BLOOD (ROUTINE X 2)
Culture  Setup Time: NO GROWTH
Special Requests: ADEQUATE

## 2024-08-12 NOTE — Discharge Summary (Addendum)
 Death Summary  Jonathan Baird FMW:[id number] DOB: 1932-11-17 DOA: 08/10/24  PCP: Verdia Lombard, MD  Admit date: 2024-08-10 Date of Death: 2024/08/18 Time of Death: 10:50 am  Brief Narrative / Interim history: 89 y.o. male with PMH significant for dementia, DM2, HTN, HLD, PVD, carotid artery stenosis, CKD, colon cancer 2005 status post hemicolectomy, severe protein calorie malnutrition, mostly bedbound for at least the last 3 months. 08-10-2024, patient presented with altered mental status, poor oral intake, infected sacral wound. In the ER, had runs of VT then went into VT cardiac arrest, was resuscitated, intubated and admitted to ICU.  Subsequently found to have Proteus bacteremia.   Palliative care was consulted.  After a family meeting, patient was switched to comfort care status Extubated 9/21 with the hope that he would be stable for home hospice.  Unfortunately his respiratory status has  deteriorated.  He was started on fentanyl  drip for work of breathing.  9/23, transferred out to [facility].   Final Diagnoses:  Septic shock Proteus bacteremia  Aspiration PNA  Infected sacral decub  VT arrest Acute encephalopathy superimposed on baseline dementia Acute resp failure w hypoxia  Hx HTN PVD CAD CAS DM2 AKI on CKD IV  Bed bound state  Physical deconditioning  Anemia of chronic dz  Pressure injuries POA Severe protein calorie malnutrition Dysphagia    The results of significant diagnostics from this hospitalization (including imaging, microbiology, ancillary and laboratory) are listed below for reference.    Significant Diagnostic Studies: CT CHEST ABDOMEN PELVIS WO CONTRAST Addendum Date: Aug 10, 2024 ADDENDUM REPORT: 2024/08/10 20:01 ADDENDUM: The following item was omitted from the impression section: 6.  Acute right anterolateral third through fifth rib fractures. Electronically Signed   By: Ozell Daring M.D.   On: August 10, 2024 20:01   Result Date: 08-10-24 CLINICAL DATA:   Sepsis, sacral wound EXAM: CT CHEST, ABDOMEN AND PELVIS WITHOUT CONTRAST TECHNIQUE: Multidetector CT imaging of the chest, abdomen and pelvis was performed following the standard protocol without IV contrast. RADIATION DOSE REDUCTION: This exam was performed according to the departmental dose-optimization program which includes automated exposure control, adjustment of the mA and/or kV according to patient size and/or use of iterative reconstruction technique. COMPARISON:  05/20/2024 FINDINGS: CT CHEST FINDINGS Cardiovascular: Unenhanced imaging of the heart is unremarkable without significant pericardial effusion. Normal caliber of the thoracic aorta. Extensive atherosclerosis of the aorta and coronary vasculature. Assessment of the vascular lumen cannot be performed without intravenous contrast. Mediastinum/Nodes: Endotracheal tube, tip well above carina. Enteric catheter extends into the gastric lumen. No pathologic adenopathy. Lungs/Pleura: Dependent consolidation within the bilateral lungs, greatest in the lower lobes, consistent with a combination of airspace disease and atelectasis. Airspace disease could be a result of aspiration or infection. No effusion or pneumothorax. Central airways are patent. Musculoskeletal: There are acute right third through fifth anterolateral rib fractures, new since prior study. No other acute bony abnormalities. Reconstructed images demonstrate no additional findings. CT ABDOMEN PELVIS FINDINGS Hepatobiliary: Unremarkable unenhanced appearance of the liver and gallbladder. Pancreas: Unremarkable unenhanced appearance. Spleen: Unremarkable unenhanced appearance. Adrenals/Urinary Tract: No urinary tract calculi or obstructive uropathy. The adrenals are unremarkable. The bladder is decompressed with a Foley catheter, with gas in the bladder lumen consistent with catheter placement. Stomach/Bowel: No bowel obstruction or ileus. There is a large amount of retained stool within the  sigmoid colon and rectal vault, consistent with constipation and fecal impaction. There is wall thickening of the anal rectal junction which could reflect superimposed stercoral colitis. Enteric catheter within  the gastric lumen. Vascular/Lymphatic: Aortic atherosclerosis. No enlarged abdominal or pelvic lymph nodes. Reproductive: Prostate is unremarkable. Other: No free fluid or free intraperitoneal gas. No abdominal wall hernia. Musculoskeletal: There is a sacral decubitus ulcer which extends to the dorsal margin of the sacrococcygeal junction, with underlying cortical erosion consistent with osteomyelitis. Subcutaneous gas is seen adjacent to the ulceration, with no fluid collection or abscess identified. There are no acute displaced fractures. Reconstructed images demonstrate no additional findings. IMPRESSION: 1. Dependent consolidation within the bilateral lungs, compatible with a combination of airspace disease and atelectasis. Airspace disease could be a result of infection or aspiration. 2. Sacral decubitus ulcer, with evidence of underlying osteomyelitis at the sacrococcygeal junction. 3. Large amount of retained stool within the sigmoid colon and rectal vault, consistent with constipation and fecal impaction. Wall thickening of the anal rectal junction may reflect superimposed stercoral colitis. 4. Support devices as above. 5. Aortic Atherosclerosis (ICD10-I70.0). Coronary artery atherosclerosis. Electronically Signed: By: Ozell Daring M.D. On: 07/30/2024 19:52   CT Head Wo Contrast Result Date: 07/30/2024 CLINICAL DATA:  AMS EXAM: CT HEAD WITHOUT CONTRAST TECHNIQUE: Contiguous axial images were obtained from the base of the skull through the vertex without intravenous contrast. RADIATION DOSE REDUCTION: This exam was performed according to the departmental dose-optimization program which includes automated exposure control, adjustment of the mA and/or kV according to patient size and/or use of  iterative reconstruction technique. COMPARISON:  CT head 05/20/2024 FINDINGS: Brain: Stable prominence of the lateral ventricles may be related to central predominant atrophy, although a component of normal pressure/communicating hydrocephalus cannot be excluded. No evidence of large-territorial acute infarction. No parenchymal hemorrhage. No mass lesion. No extra-axial collection. No mass effect or midline shift. No hydrocephalus. Basilar cisterns are patent. Vascular: No hyperdense vessel. Atherosclerotic calcifications are present within the cavernous internal carotid arteries. Skull: No acute fracture or focal lesion. Sinuses/Orbits: Paranasal sinuses and mastoid air cells are clear. The orbits are unremarkable. Other: None. IMPRESSION: Stable prominence of the lateral ventricles may be related to central predominant atrophy, although a component of normal pressure/communicating hydrocephalus cannot be excluded. Electronically Signed   By: Morgane  Naveau M.D.   On: 07/30/2024 19:42   DG Chest Port 1 View Result Date: 07/30/2024 CLINICAL DATA:  Sacral wounds.  Possible sepsis. EXAM: PORTABLE CHEST 1 VIEW COMPARISON:  May 21, 2024. FINDINGS: The heart size and mediastinal contours are within normal limits. Endotracheal and nasogastric tubes are in grossly good position. Continued mild right basilar opacity is noted suggesting atelectasis or infiltrate. Left lung is unremarkable. The visualized skeletal structures are unremarkable. IMPRESSION: Endotracheal and nasogastric tubes are in grossly good position. Continued right basilar opacity is noted suggesting atelectasis or infiltrate. Electronically Signed   By: Lynwood Landy Raddle M.D.   On: 07/30/2024 18:01    Microbiology: Recent Results (from the past 240 hours)  Blood Culture (routine x 2)     Status: Abnormal   Collection Time: 07/30/24  5:03 PM   Specimen: BLOOD  Result Value Ref Range Status   Specimen Description BLOOD SITE NOT SPECIFIED  Final    Special Requests   Final    BOTTLES DRAWN AEROBIC AND ANAEROBIC Blood Culture adequate volume   Culture  Setup Time   Final    GRAM NEGATIVE RODS IN BOTH AEROBIC AND ANAEROBIC BOTTLES CRITICAL RESULT CALLED TO, READ BACK BY AND VERIFIED WITH: PHARMD JESSICA CARNEY [id number] AT 1716 BY EC Performed at [facility], [address], [place],  [place] [address]    Culture PROTEUS MIRABILIS (A)  Final   Report Status 08/02/2024 FINAL  Final   Organism ID, Bacteria PROTEUS MIRABILIS  Final      Susceptibility   Proteus mirabilis - MIC*    AMPICILLIN 4 SENSITIVE Sensitive     CEFAZOLIN (NON-URINE) 4 INTERMEDIATE Intermediate     CEFEPIME  <=0.12 SENSITIVE Sensitive     ERTAPENEM <=0.12 SENSITIVE Sensitive     CEFTRIAXONE  <=0.25 SENSITIVE Sensitive     CIPROFLOXACIN <=0.06 SENSITIVE Sensitive     GENTAMICIN <=1 SENSITIVE Sensitive     MEROPENEM 1 SENSITIVE Sensitive     TRIMETH/SULFA <=20 SENSITIVE Sensitive     AMPICILLIN/SULBACTAM <=2 SENSITIVE Sensitive     PIP/TAZO Value in next row Sensitive      <=4 SENSITIVEThis is a modified FDA-approved test that has been validated and its performance characteristics determined by the reporting laboratory.  This laboratory is certified under the Clinical Laboratory Improvement Amendments CLIA as qualified to perform high complexity clinical laboratory testing.    * PROTEUS MIRABILIS  Blood Culture ID Panel (Reflexed)     Status: Abnormal   Collection Time: 07/30/24  5:03 PM  Result Value Ref Range Status   Enterococcus faecalis NOT DETECTED NOT DETECTED Final   Enterococcus Faecium NOT DETECTED NOT DETECTED Final   Listeria monocytogenes NOT DETECTED NOT DETECTED Final   Staphylococcus species NOT DETECTED NOT DETECTED Final   Staphylococcus aureus (BCID) NOT DETECTED NOT DETECTED Final   Staphylococcus epidermidis NOT DETECTED NOT DETECTED Final   Staphylococcus lugdunensis NOT DETECTED NOT DETECTED Final   Streptococcus species NOT  DETECTED NOT DETECTED Final   Streptococcus agalactiae NOT DETECTED NOT DETECTED Final   Streptococcus pneumoniae NOT DETECTED NOT DETECTED Final   Streptococcus pyogenes NOT DETECTED NOT DETECTED Final   A.calcoaceticus-baumannii NOT DETECTED NOT DETECTED Final   Bacteroides fragilis NOT DETECTED NOT DETECTED Final   Enterobacterales DETECTED (A) NOT DETECTED Final    Comment: Enterobacterales represent a large order of gram negative bacteria, not a single organism. CRITICAL RESULT CALLED TO, READ BACK BY AND VERIFIED WITH: PHARMD JESSICA CARNEY [id number] AT 1716 BY EC    Enterobacter cloacae complex NOT DETECTED NOT DETECTED Final   Escherichia coli NOT DETECTED NOT DETECTED Final   Klebsiella aerogenes NOT DETECTED NOT DETECTED Final   Klebsiella oxytoca NOT DETECTED NOT DETECTED Final   Klebsiella pneumoniae NOT DETECTED NOT DETECTED Final   Proteus species DETECTED (A) NOT DETECTED Final    Comment: CRITICAL RESULT CALLED TO, READ BACK BY AND VERIFIED WITH: PHARMD JESSICA CARNEY [id number] AT 1716 BY EC    Salmonella species NOT DETECTED NOT DETECTED Final   Serratia marcescens NOT DETECTED NOT DETECTED Final   Haemophilus influenzae NOT DETECTED NOT DETECTED Final   Neisseria meningitidis NOT DETECTED NOT DETECTED Final   Pseudomonas aeruginosa NOT DETECTED NOT DETECTED Final   Stenotrophomonas maltophilia NOT DETECTED NOT DETECTED Final   Candida albicans NOT DETECTED NOT DETECTED Final   Candida auris NOT DETECTED NOT DETECTED Final   Candida glabrata NOT DETECTED NOT DETECTED Final   Candida krusei NOT DETECTED NOT DETECTED Final   Candida parapsilosis NOT DETECTED NOT DETECTED Final   Candida tropicalis NOT DETECTED NOT DETECTED Final   Cryptococcus neoformans/gattii NOT DETECTED NOT DETECTED Final   CTX-M ESBL NOT DETECTED NOT DETECTED Final   Carbapenem resistance IMP NOT DETECTED NOT DETECTED Final   Carbapenem resistance KPC NOT DETECTED NOT DETECTED Final   Carbapenem  resistance NDM  NOT DETECTED NOT DETECTED Final   Carbapenem resist OXA 48 LIKE NOT DETECTED NOT DETECTED Final   Carbapenem resistance VIM NOT DETECTED NOT DETECTED Final    Comment: Performed at [facility], [address] [place], [place] [address]  Blood Culture (routine x 2)     Status: Abnormal   Collection Time: 07/30/24  5:04 PM   Specimen: BLOOD  Result Value Ref Range Status   Specimen Description BLOOD SITE NOT SPECIFIED  Final   Special Requests   Final    BOTTLES DRAWN AEROBIC AND ANAEROBIC Blood Culture adequate volume   Culture  Setup Time   Final    GRAM POSITIVE RODS ANAEROBIC BOTTLE ONLY CRITICAL RESULT CALLED TO, READ BACK BY AND VERIFIED WITH: PHARMD ESABRA FAIL [id number] FCP    Culture (A)  Final    EUBACTERIUM LENTUM Standardized susceptibility testing for this organism is not available. Performed at [facility], [address] [place], [place] [address]    Report Status 08-24-24 FINAL  Final  Urine Culture     Status: Abnormal   Collection Time: 07/30/24  7:50 PM   Specimen: Urine, Clean Catch  Result Value Ref Range Status   Specimen Description URINE, CLEAN CATCH  Final   Special Requests   Final    NONE Reflexed from [id number] Performed at [facility], [address] [place], [place] [address]    Culture >=100,000 COLONIES/mL PROTEUS MIRABILIS (A)  Final   Report Status 08/02/2024 FINAL  Final   Organism ID, Bacteria PROTEUS MIRABILIS (A)  Final      Susceptibility   Proteus mirabilis - MIC*    AMPICILLIN 4 SENSITIVE Sensitive     CEFAZOLIN (URINE) Value in next row Sensitive      4 SENSITIVEThis is a modified FDA-approved test that has been validated and its performance characteristics determined by the reporting laboratory.  This laboratory is certified under the Clinical Laboratory Improvement Amendments CLIA as qualified to perform high complexity clinical laboratory testing.    CEFEPIME  Value in next row Sensitive      4  SENSITIVEThis is a modified FDA-approved test that has been validated and its performance characteristics determined by the reporting laboratory.  This laboratory is certified under the Clinical Laboratory Improvement Amendments CLIA as qualified to perform high complexity clinical laboratory testing.    ERTAPENEM Value in next row Sensitive      4 SENSITIVEThis is a modified FDA-approved test that has been validated and its performance characteristics determined by the reporting laboratory.  This laboratory is certified under the Clinical Laboratory Improvement Amendments CLIA as qualified to perform high complexity clinical laboratory testing.    CEFTRIAXONE  Value in next row Sensitive      4 SENSITIVEThis is a modified FDA-approved test that has been validated and its performance characteristics determined by the reporting laboratory.  This laboratory is certified under the Clinical Laboratory Improvement Amendments CLIA as qualified to perform high complexity clinical laboratory testing.    CIPROFLOXACIN Value in next row Sensitive      4 SENSITIVEThis is a modified FDA-approved test that has been validated and its performance characteristics determined by the reporting laboratory.  This laboratory is certified under the Clinical Laboratory Improvement Amendments CLIA as qualified to perform high complexity clinical laboratory testing.    GENTAMICIN Value in next row Sensitive      4 SENSITIVEThis is a modified FDA-approved test that has been validated and its performance characteristics  determined by the reporting laboratory.  This laboratory is certified under the Clinical Laboratory Improvement Amendments CLIA as qualified to perform high complexity clinical laboratory testing.    NITROFURANTOIN Value in next row Resistant      4 SENSITIVEThis is a modified FDA-approved test that has been validated and its performance characteristics determined by the reporting laboratory.  This laboratory is  certified under the Clinical Laboratory Improvement Amendments CLIA as qualified to perform high complexity clinical laboratory testing.    TRIMETH/SULFA Value in next row Sensitive      4 SENSITIVEThis is a modified FDA-approved test that has been validated and its performance characteristics determined by the reporting laboratory.  This laboratory is certified under the Clinical Laboratory Improvement Amendments CLIA as qualified to perform high complexity clinical laboratory testing.    AMPICILLIN/SULBACTAM Value in next row Resistant      4 SENSITIVEThis is a modified FDA-approved test that has been validated and its performance characteristics determined by the reporting laboratory.  This laboratory is certified under the Clinical Laboratory Improvement Amendments CLIA as qualified to perform high complexity clinical laboratory testing.    PIP/TAZO Value in next row Sensitive      <=4 SENSITIVEThis is a modified FDA-approved test that has been validated and its performance characteristics determined by the reporting laboratory.  This laboratory is certified under the Clinical Laboratory Improvement Amendments CLIA as qualified to perform high complexity clinical laboratory testing.    MEROPENEM Value in next row Intermediate      <=4 SENSITIVEThis is a modified FDA-approved test that has been validated and its performance characteristics determined by the reporting laboratory.  This laboratory is certified under the Clinical Laboratory Improvement Amendments CLIA as qualified to perform high complexity clinical laboratory testing.    * >=100,000 COLONIES/mL PROTEUS MIRABILIS  MRSA Next Gen by PCR, Nasal     Status: None   Collection Time: 07/30/24  9:42 PM   Specimen: Nasal Mucosa; Nasal Swab  Result Value Ref Range Status   MRSA by PCR Next Gen NOT DETECTED NOT DETECTED Final    Comment: (NOTE) The GeneXpert MRSA Assay (FDA approved for NASAL specimens only), is one component of a  comprehensive MRSA colonization surveillance program. It is not intended to diagnose MRSA infection nor to guide or monitor treatment for MRSA infections. Test performance is not FDA approved in patients less than 31 years old. Performed at [facility], [address], [place], [place] [address]      Labs: Basic Metabolic Panel: Recent Labs  Lab 07/31/24 1945 08/01/24 0252 08/02/24 0238  NA 139 141 142  K 6.0* 5.3* 4.4  CL 104 103 105  CO2 15* 18* 20*  GLUCOSE 201* 265* 277*  BUN 103* 107* 122*  CREATININE 5.23* 5.24* 5.82*  CALCIUM  8.6* 8.4* 8.4*  MG  --  2.3 2.5*  PHOS  --  5.0* 4.2   Liver Function Tests: No results for input(s): AST, ALT, ALKPHOS, BILITOT, PROT, ALBUMIN in the last 168 hours. No results for input(s): LIPASE, AMYLASE in the last 168 hours. No results for input(s): AMMONIA in the last 168 hours. CBC: Recent Labs  Lab 08/01/24 1040 08/02/24 0238  WBC 23.7* 20.5*  HGB 7.3* 6.7*  HCT 21.2* 19.7*  MCV 74.1* 74.9*  PLT 225 207   Cardiac Enzymes: No results for input(s): CKTOTAL, CKMB, CKMBINDEX, TROPONINI in the last 168 hours. D-Dimer No results for input(s): DDIMER in the last 72 hours. BNP: Invalid input(s): POCBNP  CBG: Recent Labs  Lab 08/01/24 1937 08/02/24 0006 08/02/24 0307 08/02/24 0725 08/02/24 1121  GLUCAP 286* 219* 262* 242* 211*   Anemia work up No results for input(s): VITAMINB12, FOLATE, FERRITIN, TIBC, IRON, RETICCTPCT in the last 72 hours. Urinalysis    Component Value Date/Time   COLORURINE AMBER (A) 07/30/2024 1950   APPEARANCEUR CLOUDY (A) 07/30/2024 1950   LABSPEC 1.015 07/30/2024 1950   PHURINE 6.0 07/30/2024 1950   GLUCOSEU NEGATIVE 07/30/2024 1950   HGBUR SMALL (A) 07/30/2024 1950   BILIRUBINUR NEGATIVE 07/30/2024 1950   KETONESUR NEGATIVE 07/30/2024 1950   PROTEINUR 100 (A) 07/30/2024 1950   NITRITE NEGATIVE 07/30/2024 1950   LEUKOCYTESUR MODERATE (A)  07/30/2024 1950   Sepsis Labs Recent Labs  Lab 08/01/24 1040 08/02/24 0238  WBC 23.7* 20.5*       SIGNED:  Nilda Fendt, MD  [facility] 09-06-2024, 10:56 AM Pager   If 7PM-7AM, please contact night-coverage [email or web address] Password [email or web address]

## 2024-08-12 NOTE — Plan of Care (Signed)
   Problem: Education: Goal: Knowledge of General Education information will improve Description: Including pain rating scale, medication(s)/side effects and non-pharmacologic comfort measures Outcome: Not Progressing

## 2024-08-12 NOTE — Progress Notes (Signed)
 Family made aware of patient's death at 31. Cy and [name] Finder aware and will be visiting soon.

## 2024-08-12 NOTE — Progress Notes (Signed)
 Patient has expired. Time of death pronounced at 1039 AM with Avelina BROCKS., RN. Notified Nilda Fendt, MD 1040 AM 2024/08/29.

## 2024-08-12 NOTE — Progress Notes (Signed)
 Daily Progress Note   Date: August 18, 2024   Patient Name: Jonathan Baird  DOB: Dec 09, 1932  MRN: [id number]  Age / Sex: 89 y.o., male  Attending Physician: Trixie Nilda HERO, MD Primary Care Physician: Verdia Lombard, MD Admit Date: 07/30/2024 Length of Stay: 8 days  Reason for Follow-up: Non pain symptom management, Pain control, and Terminal Care  Past Medical History:  Diagnosis Date   Carotid artery stenosis    Right   CKD (chronic kidney disease), stage IV (HCC)    Colon cancer (HCC) 10/12/2004   stage III, s/p hemicolectomy   Dementia (HCC)    Diabetes mellitus    HLD (hyperlipidemia)    HTN (hypertension)    Peripheral vascular disease     Subjective:   Subjective: Chart Reviewed. Updates received. Patient Assessed. Created space and opportunity for patient  and family to explore thoughts and feelings regarding current medical situation.  Today's Discussion: Today before meeting with the patient/family, I reviewed the chart notes including chaplain notes from yesterday, nursing notes from today.  I also reviewed vital signs, nursing flowsheets, and medication administrations record. No labs due to comfort care status.  Comfort medications received in the last 24 hours include continued fentanyl  drip at 75 mcg/h, scheduled Robinul  0.4 mg every 4 hours, 2 mg of Versed  at 7:28 PM yesterday.  Today when I went to the bedside I saw the hospitalist at the bedside.  He informed me that he received a message from the nurse about 5 minutes ago that the patient had just passed.  When I went to check, the patient had passed in bed and no family was at the bedside.  I called and spoke with the patient's daughter and confirmed that she was told that her father passed.  Offered my condolences and spent time talking to her about grief support resources.  We had previously spoke on this, along with our chaplain, extensively.  I told her I would be emailing her some more resources to  help her in her grief during this difficult time.  I encouraged her to contact resources provided to help her in her grieving process.  I again offered my condolences.  Palliative medicine will continue to follow along while the patient is admitted inpatient with anticipated hospital death.  Review of Systems  Unable to perform ROS   Objective:   Primary Diagnoses: Present on Admission:  Sepsis (HCC)   Vital Signs:  BP (!) 158/78 (BP Location: Right Arm)   Pulse 90   Temp (!) 97.5 F (36.4 C) (Axillary)   Resp 16   Ht 6' (1.829 m)   Wt 54.3 kg   SpO2 100%   BMI 16.24 kg/m   Physical Exam Vitals reviewed: Patient has passed.     Palliative Assessment/Data: 0%   Existing Vynca/ACP Documentation: None  Assessment & Plan:   HPI/Patient Profile:  89 y.o. male  with past medical history of HTN, dyslipidemia, dementia, PVD, DM-2, CKD-IV, right RCA stenosis, carotid stenosis on anticoagulation and ASA, colon cancer s/p hemicolectomy, bed bound since last admission May 22, 2024 (respiratory distress, managed conservatively SBO, aspiration pneumonia, witnessed seizure, AMS, and septic shock), anemia of chronic illness, heels and sacral bedsore, severe PCM, and dysphagia (modified diet), who was brought to ED tonight due to poor oral intake, AMS, and lethargy for 2 days. He is refusing to eat over the past 48 hours.  He was admitted on 07/30/2024 with sepsis due to infected sacral bedsore, possible aspiration pneumonia,  VT cardiac arrest with ROSC after 6 minutes, lactic acidosis/AGMA due to sepsis and cardiac arrest, dementia, bedbound status for 2.5 months, poor functional status, and others.   Palliative medicine was consulted for GOC conversations.  SUMMARY OF RECOMMENDATIONS   The patient has passed away Significant emotional support provided to her daughter including emailing resources on grief support  Symptom Management:  N/A  Code Status: DNR - Comfort  Prognosis:  Hours - Days  Discharge Planning: Anticipated Hospital Death  Discussed with: Medical team, nursing team, patient's family  Thank you for allowing us  to participate in the care of Jonathan Baird PMT will continue to support holistically.  Time Spent: 30 min  Detailed review of medical records (labs, imaging, vital signs), medically appropriate exam, discussed with treatment team, counseling and education to patient, family, & staff, documenting clinical information, medication management, coordination of care  Camellia Kays, NP Palliative Medicine Team  Team Phone # [phone] (Nights/Weekends)  07/11/2021, 8:17 AM

## 2024-08-12 DEATH — deceased
